# Patient Record
Sex: Male | Born: 1950
Health system: Southern US, Community
[De-identification: ages and names within clinical notes are randomized; demographics above are authoritative.]

## PROBLEM LIST (undated history)

## (undated) DIAGNOSIS — K7460 Unspecified cirrhosis of liver: Secondary | ICD-10-CM

## (undated) DIAGNOSIS — K760 Fatty (change of) liver, not elsewhere classified: Secondary | ICD-10-CM

## (undated) DIAGNOSIS — I2510 Atherosclerotic heart disease of native coronary artery without angina pectoris: Secondary | ICD-10-CM

## (undated) DIAGNOSIS — Z87442 Personal history of urinary calculi: Secondary | ICD-10-CM

## (undated) DIAGNOSIS — E119 Type 2 diabetes mellitus without complications: Secondary | ICD-10-CM

## (undated) DIAGNOSIS — R011 Cardiac murmur, unspecified: Secondary | ICD-10-CM

## (undated) DIAGNOSIS — D61818 Other pancytopenia: Secondary | ICD-10-CM

## (undated) DIAGNOSIS — N189 Chronic kidney disease, unspecified: Secondary | ICD-10-CM

## (undated) DIAGNOSIS — M1990 Unspecified osteoarthritis, unspecified site: Secondary | ICD-10-CM

## (undated) DIAGNOSIS — C220 Liver cell carcinoma: Secondary | ICD-10-CM

## (undated) DIAGNOSIS — G4730 Sleep apnea, unspecified: Secondary | ICD-10-CM

## (undated) DIAGNOSIS — I214 Non-ST elevation (NSTEMI) myocardial infarction: Secondary | ICD-10-CM

## (undated) HISTORY — DX: Other pancytopenia: D61.818

## (undated) HISTORY — PX: HERNIA REPAIR: SHX51

## (undated) HISTORY — PX: TONSILLECTOMY: SUR1361

---

## 2001-07-12 ENCOUNTER — Ambulatory Visit: Admission: RE | Admit: 2001-07-12 | Discharge: 2001-07-12 | Payer: Self-pay | Admitting: Oral Surgery

## 2001-11-08 ENCOUNTER — Ambulatory Visit: Admission: RE | Admit: 2001-11-08 | Discharge: 2001-11-08 | Payer: Self-pay | Admitting: Pulmonary Disease

## 2002-12-31 ENCOUNTER — Ambulatory Visit (HOSPITAL_COMMUNITY): Admission: RE | Admit: 2002-12-31 | Discharge: 2002-12-31 | Payer: Self-pay | Admitting: Family Medicine

## 2002-12-31 ENCOUNTER — Encounter: Payer: Self-pay | Admitting: Family Medicine

## 2003-09-08 ENCOUNTER — Emergency Department (HOSPITAL_COMMUNITY): Admission: EM | Admit: 2003-09-08 | Discharge: 2003-09-08 | Payer: Self-pay | Admitting: Emergency Medicine

## 2005-11-29 ENCOUNTER — Ambulatory Visit: Payer: Self-pay | Admitting: Internal Medicine

## 2005-11-29 ENCOUNTER — Encounter (INDEPENDENT_AMBULATORY_CARE_PROVIDER_SITE_OTHER): Payer: Self-pay | Admitting: *Deleted

## 2005-11-29 ENCOUNTER — Ambulatory Visit (HOSPITAL_COMMUNITY): Admission: RE | Admit: 2005-11-29 | Discharge: 2005-11-29 | Payer: Self-pay | Admitting: Internal Medicine

## 2007-04-07 ENCOUNTER — Ambulatory Visit (HOSPITAL_COMMUNITY): Admission: RE | Admit: 2007-04-07 | Discharge: 2007-04-07 | Payer: Self-pay | Admitting: Family Medicine

## 2007-08-25 ENCOUNTER — Ambulatory Visit (HOSPITAL_COMMUNITY): Admission: RE | Admit: 2007-08-25 | Discharge: 2007-08-25 | Payer: Self-pay | Admitting: Family Medicine

## 2010-09-04 NOTE — Op Note (Signed)
NAME:  Cody Austin, Cody Austin                ACCOUNT NO.:  [id number]   MEDICAL RECORD NO.:  [id number]          PATIENT TYPE:  AMB   LOCATION:  DAY                           FACILITY:  [facility]   PHYSICIAN:  R. Garfield Cornea, M.D. DATE OF BIRTH:  [birth year]   DATE OF PROCEDURE:  11/29/2005  DATE OF DISCHARGE:                                 OPERATIVE REPORT   INDICATIONS FOR PROCEDURE:  The patient is a 60 year old Caucasian male  presented through courtesy of Halford Chessman, M.D. for colorectal cancer  screening.  He is devoid of any lower GI tract symptoms.  He has never had  his colon imaged previously.  There is no family history of colorectal  neoplasia.  Colonoscopy is now being done with standard screening maneuvers.  This approach has been discussed with the patient at length.  Potential  risks, benefits, and alternatives have been reviewed and questions answered.  He is agreeable.  Please documentation in medical record.   DESCRIPTION OF PROCEDURE:  O2 saturation, blood pressure, pulse oximetry on  this patient were monitored throughout this entire procedure.  Conscious  sedation was Versed 3 mg IV and Demerol 50 mg IV in incremental doses.   INSTRUMENT:  Olympus videoscope.   FINDINGS:  Digital rectal examination revealed no abnormalities.  On  endoscopic findings, prep was adequate.   Rectum:  Examination of rectal mucosa including retroflexed view of the anal  verge revealed no abnormalities.   Colon:  Colonic mucosa was surveyed.  Rectosigmoid junction to the left  transverse and right colon to the area of the appendiceal orifice,  ileocecum, and cecum were viewed.  These structures were well seen and  photographed for the record.  From this point the scope was slowly  withdrawn.  All previously viewed mucosal surfaces were again seen.  The  patient was noted to have an 8 mm pedunculated polyp in the very distal  sigmoid rectosigmoid junction and a 4 mm pedunculated polyp in the  mid  descending colon.  The remaining colonic mucosa appeared normal.  The  smaller pedunculated polyp in the mid descending colon was removed with cold  snare technique.  The polyp at the rectosigmoid junction was removed with  hot snare.  Both polyps were recovered through the scope.  The patient  tolerated the procedure well and was reactivated in endoscopy.   IMPRESSION:  Polyps at the rectosigmoid and mid descending colon.  They were  resected as described above.  Otherwise remainder of the colon and rectum  appeared normal.   RECOMMENDATIONS:  No aspirin or arthritis medications for 10 days,  specifically no Naprosyn for 10 days.   Follow up on pathology.   Further recommendations to follow.      Bridgette Habermann, M.D.  Electronically Signed     RMR/MEDQ  D:  11/29/2005  T:  11/29/2005  Job:  [id number]   cc:   Halford Chessman, M.D.  Fax: [phone]

## 2010-11-23 ENCOUNTER — Encounter: Payer: Self-pay | Admitting: Internal Medicine

## 2014-08-06 NOTE — Patient Instructions (Signed)
Cody Austin  08/06/2014   Your procedure is scheduled on:  08/12/2014  Report to [facility] at 10:30 AM.  Call this number if you have problems the morning of surgery: [phone]   Remember:   Do not eat food or drink liquids after midnight.   Take these medicines the morning of surgery with A SIP OF WATER: None   Do not wear jewelry, make-up or nail polish.  Do not wear lotions, powders, or perfumes. You may wear deodorant.  Do not shave 48 hours prior to surgery. Men may shave face and neck.  Do not bring valuables to the hospital.  [facility] is not responsible for any belongings or valuables.               Contacts, dentures or bridgework may not be worn into surgery.  Leave suitcase in the car. After surgery it may be brought to your room.  For patients admitted to the hospital, discharge time is determined by your                treatment team.               Patients discharged the day of surgery will not be allowed to drive  home.  Name and phone number of your driver:   Special Instructions: N/A   Please read over the following fact sheets that you were given: Anesthesia Post-op Instructions   PATIENT INSTRUCTIONS POST-ANESTHESIA  IMMEDIATELY FOLLOWING SURGERY:  Do not drive or operate machinery for the first twenty four hours after surgery.  Do not make any important decisions for twenty four hours after surgery or while taking narcotic pain medications or sedatives.  If you develop intractable nausea and vomiting or a severe headache please notify your doctor immediately.  FOLLOW-UP:  Please make an appointment with your surgeon as instructed. You do not need to follow up with anesthesia unless specifically instructed to do so.  WOUND CARE INSTRUCTIONS (if applicable):  Keep a dry clean dressing on the anesthesia/puncture wound site if there is drainage.  Once the wound has quit draining you may leave it open to air.  Generally you should leave the bandage intact for  twenty four hours unless there is drainage.  If the epidural site drains for more than 36-48 hours please call the anesthesia department.  QUESTIONS?:  Please feel free to call your physician or the hospital operator if you have any questions, and they will be happy to assist you.      Cataract Surgery  A cataract is a clouding of the lens of the eye. When a lens becomes cloudy, vision is reduced based on the degree and nature of the clouding. Surgery may be needed to improve vision. Surgery removes the cloudy lens and usually replaces it with a substitute lens (intraocular lens, IOL). LET YOUR EYE DOCTOR KNOW ABOUT:  Allergies to food or medicine.  Medicines taken including herbs, eye drops, over-the-counter medicines, and creams.  Use of steroids (by mouth or creams).  Previous problems with anesthetics or numbing medicine.  History of bleeding problems or blood clots.  Previous surgery.  Other health problems, including diabetes and kidney problems.  Possibility of pregnancy, if this applies. RISKS AND COMPLICATIONS  Infection.  Inflammation of the eyeball (endophthalmitis) that can spread to both eyes (sympathetic ophthalmia).  Poor wound healing.  If an IOL is inserted, it can later fall out of proper position. This is very uncommon.  Clouding of the  part of your eye that holds an IOL in place. This is called an "after-cataract." These are uncommon but easily treated. BEFORE THE PROCEDURE  Do not eat or drink anything except small amounts of water for 8 to 12 before your surgery, or as directed by your caregiver.  Unless you are told otherwise, continue any eye drops you have been prescribed.  Talk to your primary caregiver about all other medicines that you take (both prescription and nonprescription). In some cases, you may need to stop or change medicines near the time of your surgery. This is most important if you are taking blood-thinning medicine.Do not stop  medicines unless you are told to do so.  Arrange for someone to drive you to and from the procedure.  Do not put contact lenses in either eye on the day of your surgery. PROCEDURE There is more than one method for safely removing a cataract. Your doctor can explain the differences and help determine which is best for you. Phacoemulsification surgery is the most common form of cataract surgery.  An injection is given behind the eye or eye drops are given to make this a painless procedure.  A small cut (incision) is made on the edge of the clear, dome-shaped surface that covers the front of the eye (cornea).  A tiny probe is painlessly inserted into the eye. This device gives off ultrasound waves that soften and break up the cloudy center of the lens. This makes it easier for the cloudy lens to be removed by suction.  An IOL may be implanted.  The normal lens of the eye is covered by a clear capsule. Part of that capsule is intentionally left in the eye to support the IOL.  Your surgeon may or may not use stitches to close the incision. There are other forms of cataract surgery that require a larger incision and stitches to close the eye. This approach is taken in cases where the doctor feels that the cataract cannot be easily removed using phacoemulsification. AFTER THE PROCEDURE  When an IOL is implanted, it does not need care. It becomes a permanent part of your eye and cannot be seen or felt.  Your doctor will schedule follow-up exams to check on your progress.  Review your other medicines with your doctor to see which can be resumed after surgery.  Use eye drops or take medicine as prescribed by your doctor. Document Released: 03/25/2011 Document Revised: 08/20/2013 Document Reviewed: 03/25/2011 [facility] Patient Information 2015 [place], [place]. This information is not intended to replace advice given to you by your health care provider. Make sure you discuss any questions you have  with your health care provider.

## 2014-08-07 ENCOUNTER — Encounter (HOSPITAL_COMMUNITY)
Admission: RE | Admit: 2014-08-07 | Discharge: 2014-08-07 | Disposition: A | Discharge status: Home / Self Care | Payer: BLUE CROSS/BLUE SHIELD | Source: Ambulatory Visit | Attending: Ophthalmology | Admitting: Ophthalmology

## 2014-08-07 ENCOUNTER — Other Ambulatory Visit: Payer: Self-pay

## 2014-08-07 ENCOUNTER — Encounter (HOSPITAL_COMMUNITY): Payer: Self-pay

## 2014-08-07 DIAGNOSIS — Z01812 Encounter for preprocedural laboratory examination: Secondary | ICD-10-CM | POA: Insufficient documentation

## 2014-08-07 DIAGNOSIS — Z01810 Encounter for preprocedural cardiovascular examination: Secondary | ICD-10-CM | POA: Insufficient documentation

## 2014-08-07 HISTORY — DX: Unspecified osteoarthritis, unspecified site: M19.90

## 2014-08-07 HISTORY — DX: Sleep apnea, unspecified: G47.30

## 2014-08-07 HISTORY — DX: Cardiac murmur, unspecified: R01.1

## 2014-08-07 LAB — BASIC METABOLIC PANEL
Anion gap: 7 (ref 5–15)
BUN: 11 mg/dL (ref 6–23)
CHLORIDE: 108 mmol/L (ref 96–112)
CO2: 25 mmol/L (ref 19–32)
CREATININE: 0.85 mg/dL (ref 0.50–1.35)
Calcium: 9 mg/dL (ref 8.4–10.5)
GFR calc non Af Amer: >90 mL/min (ref >90)
Glucose, Bld: 118 mg/dL — ABNORMAL HIGH (ref 70–99)
Potassium: 4.1 mmol/L (ref 3.5–5.1)
Sodium: 140 mmol/L (ref 135–145)

## 2014-08-07 LAB — CBC
HCT: 41.3 % (ref 39.0–52.0)
HEMOGLOBIN: 13.8 g/dL (ref 13.0–17.0)
MCH: 32.3 pg (ref 26.0–34.0)
MCHC: 33.4 g/dL (ref 30.0–36.0)
MCV: 96.7 fL (ref 78.0–100.0)
Platelets: 109 10*3/uL — ABNORMAL LOW (ref 150–400)
RBC: 4.27 MIL/uL (ref 4.22–5.81)
RDW: 14.1 % (ref 11.5–15.5)
WBC: 2.8 10*3/uL — ABNORMAL LOW (ref 4.0–10.5)

## 2014-08-09 MED ORDER — NEOMYCIN-POLYMYXIN-DEXAMETH 3.5-10000-0.1 OP SUSP
OPHTHALMIC | Status: AC
  Filled 2014-08-09: qty 5

## 2014-08-09 MED ORDER — PHENYLEPHRINE HCL 2.5 % OP SOLN
OPHTHALMIC | Status: AC
  Filled 2014-08-09: qty 15

## 2014-08-09 MED ORDER — LIDOCAINE HCL (PF) 1 % IJ SOLN
INTRAMUSCULAR | Status: AC
  Filled 2014-08-09: qty 2

## 2014-08-09 MED ORDER — CYCLOPENTOLATE-PHENYLEPHRINE OP SOLN OPTIME - NO CHARGE
OPHTHALMIC | Status: AC
  Filled 2014-08-09: qty 2

## 2014-08-09 MED ORDER — LIDOCAINE HCL 3.5 % OP GEL
OPHTHALMIC | Status: AC
  Filled 2014-08-09: qty 1

## 2014-08-09 MED ORDER — TETRACAINE HCL 0.5 % OP SOLN
OPHTHALMIC | Status: AC
  Filled 2014-08-09: qty 2

## 2014-08-12 ENCOUNTER — Encounter (HOSPITAL_COMMUNITY)
Admission: RE | Disposition: A | Discharge status: Home / Self Care | Payer: Self-pay | Source: Ambulatory Visit | Attending: Ophthalmology

## 2014-08-12 ENCOUNTER — Encounter (HOSPITAL_COMMUNITY): Payer: Self-pay | Admitting: *Deleted

## 2014-08-12 ENCOUNTER — Ambulatory Visit (HOSPITAL_COMMUNITY)
Admission: RE | Admit: 2014-08-12 | Discharge: 2014-08-12 | Disposition: A | Discharge status: Home / Self Care | Payer: BLUE CROSS/BLUE SHIELD | Source: Ambulatory Visit | Attending: Ophthalmology | Admitting: Ophthalmology

## 2014-08-12 ENCOUNTER — Ambulatory Visit (HOSPITAL_COMMUNITY): Payer: BLUE CROSS/BLUE SHIELD | Admitting: Anesthesiology

## 2014-08-12 DIAGNOSIS — G4730 Sleep apnea, unspecified: Secondary | ICD-10-CM | POA: Insufficient documentation

## 2014-08-12 DIAGNOSIS — H2511 Age-related nuclear cataract, right eye: Secondary | ICD-10-CM | POA: Insufficient documentation

## 2014-08-12 DIAGNOSIS — E119 Type 2 diabetes mellitus without complications: Secondary | ICD-10-CM | POA: Diagnosis not present

## 2014-08-12 DIAGNOSIS — H269 Unspecified cataract: Secondary | ICD-10-CM | POA: Diagnosis present

## 2014-08-12 DIAGNOSIS — M1990 Unspecified osteoarthritis, unspecified site: Secondary | ICD-10-CM | POA: Diagnosis not present

## 2014-08-12 HISTORY — DX: Type 2 diabetes mellitus without complications: E11.9

## 2014-08-12 HISTORY — PX: CATARACT EXTRACTION W/PHACO: SHX586

## 2014-08-12 LAB — GLUCOSE, CAPILLARY: GLUCOSE-CAPILLARY: 110 mg/dL — AB (ref 70–99)

## 2014-08-12 SURGERY — PHACOEMULSIFICATION, CATARACT, WITH IOL INSERTION
Anesthesia: Monitor Anesthesia Care | Site: Eye | Laterality: Right

## 2014-08-12 MED ORDER — CYCLOPENTOLATE-PHENYLEPHRINE 0.2-1 % OP SOLN
1.0000 [drp] | OPHTHALMIC | Status: AC
  Administered 2014-08-12 (×3): 1 [drp] via OPHTHALMIC

## 2014-08-12 MED ORDER — TETRACAINE HCL 0.5 % OP SOLN
1.0000 [drp] | OPHTHALMIC | Status: AC
  Administered 2014-08-12 (×3): 1 [drp] via OPHTHALMIC

## 2014-08-12 MED ORDER — MEPERIDINE HCL 50 MG/ML IJ SOLN: 6.2500 mg | Freq: Once | INTRAMUSCULAR | Status: DC

## 2014-08-12 MED ORDER — LACTATED RINGERS IV SOLN
INTRAVENOUS | Status: DC
  Administered 2014-08-12: 1000 mL via INTRAVENOUS

## 2014-08-12 MED ORDER — BSS IO SOLN
INTRAOCULAR | Status: DC | PRN
  Administered 2014-08-12: 15 mL

## 2014-08-12 MED ORDER — FENTANYL CITRATE (PF) 100 MCG/2ML IJ SOLN
25.0000 ug | INTRAMUSCULAR | Status: AC
  Administered 2014-08-12 (×2): 25 ug via INTRAVENOUS

## 2014-08-12 MED ORDER — EPINEPHRINE HCL 1 MG/ML IJ SOLN
INTRAOCULAR | Status: DC | PRN
  Administered 2014-08-12: 500 mL

## 2014-08-12 MED ORDER — POVIDONE-IODINE 5 % OP SOLN
OPHTHALMIC | Status: DC | PRN
  Administered 2014-08-12: 1 via OPHTHALMIC

## 2014-08-12 MED ORDER — FENTANYL CITRATE (PF) 100 MCG/2ML IJ SOLN
INTRAMUSCULAR | Status: AC
  Filled 2014-08-12: qty 2

## 2014-08-12 MED ORDER — NEOMYCIN-POLYMYXIN-DEXAMETH 3.5-10000-0.1 OP SUSP
OPHTHALMIC | Status: DC | PRN
  Administered 2014-08-12: 2 [drp] via OPHTHALMIC

## 2014-08-12 MED ORDER — PHENYLEPHRINE HCL 2.5 % OP SOLN
1.0000 [drp] | OPHTHALMIC | Status: AC
  Administered 2014-08-12 (×3): 1 [drp] via OPHTHALMIC

## 2014-08-12 MED ORDER — EPINEPHRINE HCL 1 MG/ML IJ SOLN
INTRAMUSCULAR | Status: AC
  Filled 2014-08-12: qty 1

## 2014-08-12 MED ORDER — ONDANSETRON HCL 4 MG/2ML IJ SOLN: 4.0000 mg | Freq: Once | INTRAMUSCULAR | Status: DC | PRN

## 2014-08-12 MED ORDER — MIDAZOLAM HCL 2 MG/2ML IJ SOLN
1.0000 mg | INTRAMUSCULAR | Status: DC | PRN
Start: 2014-08-12 — End: 2014-08-12
  Administered 2014-08-12: 2 mg via INTRAVENOUS

## 2014-08-12 MED ORDER — FENTANYL CITRATE (PF) 100 MCG/2ML IJ SOLN: 25.0000 ug | INTRAMUSCULAR | Status: DC | PRN

## 2014-08-12 MED ORDER — LIDOCAINE HCL (PF) 1 % IJ SOLN
INTRAMUSCULAR | Status: DC | PRN
  Administered 2014-08-12: .5 mL

## 2014-08-12 MED ORDER — PROVISC 10 MG/ML IO SOLN
INTRAOCULAR | Status: DC | PRN
  Administered 2014-08-12: 0.85 mL via INTRAOCULAR

## 2014-08-12 MED ORDER — LIDOCAINE HCL 3.5 % OP GEL
1.0000 "application " | Freq: Once | OPHTHALMIC | Status: AC
  Administered 2014-08-12: 1 via OPHTHALMIC

## 2014-08-12 MED ORDER — MIDAZOLAM HCL 2 MG/2ML IJ SOLN
INTRAMUSCULAR | Status: AC
  Filled 2014-08-12: qty 2

## 2014-08-12 SURGICAL SUPPLY — 33 items
CAPSULAR TENSION RING-AMO (OPHTHALMIC RELATED) IMPLANT
CLOTH BEACON ORANGE TIMEOUT ST (SAFETY) ×2 IMPLANT
EYE SHIELD UNIVERSAL CLEAR (GAUZE/BANDAGES/DRESSINGS) ×2 IMPLANT
GLOVE BIO SURGEON STRL SZ 6.5 (GLOVE) IMPLANT
GLOVE BIOGEL PI IND STRL 6.5 (GLOVE) ×1 IMPLANT
GLOVE BIOGEL PI IND STRL 7.0 (GLOVE) IMPLANT
GLOVE BIOGEL PI IND STRL 7.5 (GLOVE) IMPLANT
GLOVE BIOGEL PI INDICATOR 6.5 (GLOVE) ×1
GLOVE BIOGEL PI INDICATOR 7.0 (GLOVE)
GLOVE BIOGEL PI INDICATOR 7.5 (GLOVE)
GLOVE ECLIPSE 6.5 STRL STRAW (GLOVE) IMPLANT
GLOVE ECLIPSE 7.0 STRL STRAW (GLOVE) IMPLANT
GLOVE ECLIPSE 7.5 STRL STRAW (GLOVE) IMPLANT
GLOVE EXAM NITRILE LRG STRL (GLOVE) IMPLANT
GLOVE EXAM NITRILE MD LF STRL (GLOVE) ×2 IMPLANT
GLOVE SKINSENSE NS SZ6.5 (GLOVE)
GLOVE SKINSENSE NS SZ7.0 (GLOVE)
GLOVE SKINSENSE STRL SZ6.5 (GLOVE) IMPLANT
GLOVE SKINSENSE STRL SZ7.0 (GLOVE) IMPLANT
KIT VITRECTOMY (OPHTHALMIC RELATED) IMPLANT
PAD ARMBOARD 7.5X6 YLW CONV (MISCELLANEOUS) ×2 IMPLANT
PROC W NO LENS (INTRAOCULAR LENS)
PROC W SPEC LENS (INTRAOCULAR LENS)
PROCESS W NO LENS (INTRAOCULAR LENS) IMPLANT
PROCESS W SPEC LENS (INTRAOCULAR LENS) IMPLANT
RETRACTOR IRIS SIGHTPATH (OPHTHALMIC RELATED) IMPLANT
RING MALYGIN (MISCELLANEOUS) IMPLANT
SIGHTPATH CAT PROC W REG LENS (Ophthalmic Related) ×2 IMPLANT
SYRINGE LUER LOK 1CC (MISCELLANEOUS) ×2 IMPLANT
TAPE SURG TRANSPORE 1 IN (GAUZE/BANDAGES/DRESSINGS) ×1 IMPLANT
TAPE SURGICAL TRANSPORE 1 IN (GAUZE/BANDAGES/DRESSINGS) ×1
VISCOELASTIC ADDITIONAL (OPHTHALMIC RELATED) IMPLANT
WATER STERILE IRR 250ML POUR (IV SOLUTION) ×2 IMPLANT

## 2014-08-12 NOTE — H&P (Signed)
I have reviewed the H&P, the patient was re-examined, and I have identified no interval changes in medical condition and plan of care since the history and physical of record  

## 2014-08-12 NOTE — Transfer of Care (Signed)
Immediate Anesthesia Transfer of Care Note  Patient: Cody Austin  Procedure(s) Performed: Procedure(s) with comments: CATARACT EXTRACTION PHACO AND INTRAOCULAR LENS PLACEMENT (IOC) (Right) - CDE 9.32  Patient Location: PACU  Anesthesia Type:MAC  Level of Consciousness: awake  Airway & Oxygen Therapy: Patient Spontanous Breathing  Post-op Assessment: Report given to RN  Post vital signs: Reviewed  Last Vitals:  Filed Vitals:   08/12/14 1115  BP: 129/75  Resp: 13    Complications: No apparent anesthesia complications

## 2014-08-12 NOTE — Anesthesia Postprocedure Evaluation (Signed)
  Anesthesia Post-op Note  Patient: Cody Austin  Procedure(s) Performed: Procedure(s) with comments: CATARACT EXTRACTION PHACO AND INTRAOCULAR LENS PLACEMENT (IOC) (Right) - CDE 9.32  Patient Location: Short Stay  Anesthesia Type:MAC  Level of Consciousness: awake, alert  and oriented  Airway and Oxygen Therapy: Patient Spontanous Breathing  Post-op Pain: none  Post-op Assessment: Post-op Vital signs reviewed, Patient's Cardiovascular Status Stable, Respiratory Function Stable, Patent Airway and No signs of Nausea or vomiting  Post-op Vital Signs: Reviewed and stable  Last Vitals:  Filed Vitals:   08/12/14 1115  BP: 129/75  Resp: 13    Complications: No apparent anesthesia complications

## 2014-08-12 NOTE — Anesthesia Preprocedure Evaluation (Signed)
Anesthesia Evaluation  Patient identified by MRN, date of birth, ID band Patient awake    Reviewed: Allergy & Precautions, NPO status , Patient's Chart, lab work & pertinent test results  Airway Mallampati: II  TM Distance: >3 FB     Dental  (+) Teeth Intact   Pulmonary sleep apnea ,  breath sounds clear to auscultation        Cardiovascular negative cardio ROS  Rhythm:Regular Rate:Normal     Neuro/Psych    GI/Hepatic negative GI ROS,   Endo/Other  diabetes, Type 2, Oral Hypoglycemic Agents  Renal/GU      Musculoskeletal  (+) Arthritis -,   Abdominal   Peds  Hematology   Anesthesia Other Findings   Reproductive/Obstetrics                             Anesthesia Physical Anesthesia Plan  ASA: III  Anesthesia Plan: MAC   Post-op Pain Management:    Induction: Intravenous  Airway Management Planned: Nasal Cannula  Additional Equipment:   Intra-op Plan:   Post-operative Plan:   Informed Consent: I have reviewed the patients History and Physical, chart, labs and discussed the procedure including the risks, benefits and alternatives for the proposed anesthesia with the patient or authorized representative who has indicated his/her understanding and acceptance.     Plan Discussed with:   Anesthesia Plan Comments:         Anesthesia Quick Evaluation

## 2014-08-12 NOTE — Discharge Instructions (Signed)

## 2014-08-12 NOTE — Op Note (Signed)
Date of Admission: 08/12/2014  Date of Surgery: 08/12/2014   Pre-Op Dx: Cataract Right Eye  Post-Op Dx: Senile Nuclear Cataract Right  Eye,  Dx Code H25.11  Surgeon: [name], M.D.  Assistants: None  Anesthesia: Topical with MAC  Indications: Painless, progressive loss of vision with compromise of daily activities.  Surgery: Cataract Extraction with Intraocular lens Implant Right Eye  Discription: The patient had dilating drops and viscous lidocaine placed into the Right eye in the pre-op holding area. After transfer to the operating room, a time out was performed. The patient was then prepped and draped. Beginning with a 10 degree blade a paracentesis port was made at the surgeon's 2 o'clock position. The anterior chamber was then filled with 1% non-preserved lidocaine. This was followed by filling the anterior chamber with Provisc.  A 2.40m keratome blade was used to make a clear corneal incision at the temporal limbus.  A bent cystatome needle was used to create a continuous tear capsulotomy. Hydrodissection was performed with balanced salt solution on a Fine canula. The lens nucleus was then removed using the phacoemulsification handpiece. Residual cortex was removed with the I&A handpiece. The anterior chamber and capsular bag were refilled with Provisc. A posterior chamber intraocular lens was placed into the capsular bag with it's injector. The implant was positioned with the Kuglan hook. The Provisc was then removed from the anterior chamber and capsular bag with the I&A handpiece. Stromal hydration of the main incision and paracentesis port was performed with BSS on a Fine canula. The wounds were tested for leak which was negative. The patient tolerated the procedure well. There were no operative complications. The patient was then transferred to the recovery room in stable condition.  Complications: None  Specimen: None  EBL: None  Prosthetic device: Hoya iSert 250, power 15.5 D,  SN [id number]

## 2014-08-13 ENCOUNTER — Encounter (HOSPITAL_COMMUNITY): Payer: Self-pay | Admitting: Ophthalmology

## 2014-08-20 ENCOUNTER — Encounter (HOSPITAL_COMMUNITY)
Admission: RE | Admit: 2014-08-20 | Discharge: 2014-08-20 | Disposition: A | Discharge status: Home / Self Care | Payer: BLUE CROSS/BLUE SHIELD | Source: Ambulatory Visit | Attending: Ophthalmology | Admitting: Ophthalmology

## 2014-08-20 ENCOUNTER — Encounter (HOSPITAL_COMMUNITY): Payer: Self-pay

## 2014-08-22 ENCOUNTER — Inpatient Hospital Stay (HOSPITAL_COMMUNITY): Admission: RE | Admit: 2014-08-22 | Payer: BLUE CROSS/BLUE SHIELD | Source: Ambulatory Visit

## 2014-08-23 MED ORDER — LIDOCAINE HCL 3.5 % OP GEL
OPHTHALMIC | Status: AC
  Filled 2014-08-23: qty 1

## 2014-08-23 MED ORDER — LIDOCAINE HCL (PF) 1 % IJ SOLN
INTRAMUSCULAR | Status: AC
  Filled 2014-08-23: qty 2

## 2014-08-23 MED ORDER — PHENYLEPHRINE HCL 2.5 % OP SOLN
OPHTHALMIC | Status: AC
  Filled 2014-08-23: qty 15

## 2014-08-23 MED ORDER — NEOMYCIN-POLYMYXIN-DEXAMETH 3.5-10000-0.1 OP SUSP
OPHTHALMIC | Status: AC
  Filled 2014-08-23: qty 5

## 2014-08-23 MED ORDER — CYCLOPENTOLATE-PHENYLEPHRINE OP SOLN OPTIME - NO CHARGE
OPHTHALMIC | Status: AC
  Filled 2014-08-23: qty 2

## 2014-08-23 MED ORDER — TETRACAINE HCL 0.5 % OP SOLN
OPHTHALMIC | Status: AC
  Filled 2014-08-23: qty 2

## 2014-08-26 ENCOUNTER — Ambulatory Visit (HOSPITAL_COMMUNITY)
Admission: RE | Admit: 2014-08-26 | Discharge: 2014-08-26 | Disposition: A | Discharge status: Home / Self Care | Payer: BLUE CROSS/BLUE SHIELD | Source: Ambulatory Visit | Attending: Ophthalmology | Admitting: Ophthalmology

## 2014-08-26 ENCOUNTER — Ambulatory Visit (HOSPITAL_COMMUNITY): Payer: BLUE CROSS/BLUE SHIELD | Admitting: Anesthesiology

## 2014-08-26 ENCOUNTER — Encounter (HOSPITAL_COMMUNITY)
Admission: RE | Disposition: A | Discharge status: Home / Self Care | Payer: Self-pay | Source: Ambulatory Visit | Attending: Ophthalmology

## 2014-08-26 ENCOUNTER — Encounter (HOSPITAL_COMMUNITY): Payer: Self-pay

## 2014-08-26 DIAGNOSIS — H2512 Age-related nuclear cataract, left eye: Secondary | ICD-10-CM | POA: Diagnosis not present

## 2014-08-26 HISTORY — PX: CATARACT EXTRACTION W/PHACO: SHX586

## 2014-08-26 LAB — GLUCOSE, CAPILLARY: Glucose-Capillary: 114 mg/dL — ABNORMAL HIGH (ref 70–99)

## 2014-08-26 SURGERY — PHACOEMULSIFICATION, CATARACT, WITH IOL INSERTION
Anesthesia: Monitor Anesthesia Care | Site: Eye | Laterality: Left

## 2014-08-26 MED ORDER — PHENYLEPHRINE HCL 2.5 % OP SOLN
1.0000 [drp] | OPHTHALMIC | Status: AC
  Administered 2014-08-26 (×3): 1 [drp] via OPHTHALMIC

## 2014-08-26 MED ORDER — FENTANYL CITRATE (PF) 100 MCG/2ML IJ SOLN
25.0000 ug | INTRAMUSCULAR | Status: AC
  Administered 2014-08-26 (×2): 25 ug via INTRAVENOUS

## 2014-08-26 MED ORDER — LIDOCAINE HCL 3.5 % OP GEL
1.0000 "application " | Freq: Once | OPHTHALMIC | Status: AC
  Administered 2014-08-26: 1 via OPHTHALMIC

## 2014-08-26 MED ORDER — LIDOCAINE HCL (PF) 1 % IJ SOLN
INTRAMUSCULAR | Status: DC | PRN
  Administered 2014-08-26: .5 mL

## 2014-08-26 MED ORDER — EPINEPHRINE HCL 1 MG/ML IJ SOLN
INTRAOCULAR | Status: DC | PRN
  Administered 2014-08-26: 13:00:00

## 2014-08-26 MED ORDER — MIDAZOLAM HCL 2 MG/2ML IJ SOLN
INTRAMUSCULAR | Status: AC
  Filled 2014-08-26: qty 2

## 2014-08-26 MED ORDER — EPINEPHRINE HCL 1 MG/ML IJ SOLN
INTRAMUSCULAR | Status: AC
  Filled 2014-08-26: qty 1

## 2014-08-26 MED ORDER — LACTATED RINGERS IV SOLN
INTRAVENOUS | Status: DC
  Administered 2014-08-26: 12:00:00 via INTRAVENOUS

## 2014-08-26 MED ORDER — CYCLOPENTOLATE-PHENYLEPHRINE 0.2-1 % OP SOLN
1.0000 [drp] | OPHTHALMIC | Status: AC
  Administered 2014-08-26 (×3): 1 [drp] via OPHTHALMIC

## 2014-08-26 MED ORDER — FENTANYL CITRATE (PF) 100 MCG/2ML IJ SOLN
INTRAMUSCULAR | Status: AC
  Filled 2014-08-26: qty 2

## 2014-08-26 MED ORDER — MIDAZOLAM HCL 2 MG/2ML IJ SOLN
1.0000 mg | INTRAMUSCULAR | Status: DC | PRN
  Administered 2014-08-26: 2 mg via INTRAVENOUS

## 2014-08-26 MED ORDER — BSS IO SOLN
INTRAOCULAR | Status: DC | PRN
  Administered 2014-08-26: 15 mL via INTRAOCULAR

## 2014-08-26 MED ORDER — PROVISC 10 MG/ML IO SOLN
INTRAOCULAR | Status: DC | PRN
  Administered 2014-08-26: 0.85 mL via INTRAOCULAR

## 2014-08-26 MED ORDER — NEOMYCIN-POLYMYXIN-DEXAMETH 3.5-10000-0.1 OP SUSP
OPHTHALMIC | Status: DC | PRN
  Administered 2014-08-26: 1 [drp] via OPHTHALMIC

## 2014-08-26 MED ORDER — POVIDONE-IODINE 5 % OP SOLN
OPHTHALMIC | Status: DC | PRN
  Administered 2014-08-26: 1 via OPHTHALMIC

## 2014-08-26 MED ORDER — TETRACAINE HCL 0.5 % OP SOLN
1.0000 [drp] | OPHTHALMIC | Status: AC
  Administered 2014-08-26 (×3): 1 [drp] via OPHTHALMIC

## 2014-08-26 SURGICAL SUPPLY — 11 items
CLOTH BEACON ORANGE TIMEOUT ST (SAFETY) ×3 IMPLANT
EYE SHIELD UNIVERSAL CLEAR (GAUZE/BANDAGES/DRESSINGS) ×3 IMPLANT
GLOVE BIOGEL PI IND STRL 7.0 (GLOVE) ×1 IMPLANT
GLOVE BIOGEL PI INDICATOR 7.0 (GLOVE) ×2
GLOVE EXAM NITRILE LRG STRL (GLOVE) ×3 IMPLANT
PAD ARMBOARD 7.5X6 YLW CONV (MISCELLANEOUS) ×3 IMPLANT
SIGHTPATH CAT PROC W REG LENS (Ophthalmic Related) ×3 IMPLANT
SYRINGE LUER LOK 1CC (MISCELLANEOUS) ×3 IMPLANT
TAPE SURG TRANSPORE 1 IN (GAUZE/BANDAGES/DRESSINGS) ×1 IMPLANT
TAPE SURGICAL TRANSPORE 1 IN (GAUZE/BANDAGES/DRESSINGS) ×2
WATER STERILE IRR 250ML POUR (IV SOLUTION) ×3 IMPLANT

## 2014-08-26 NOTE — Op Note (Signed)
Date of Admission: 08/26/2014  Date of Surgery: 08/26/2014   Pre-Op Dx: Cataract Left Eye  Post-Op Dx: Senile Nuclear Cataract Left  Eye,  Dx Code H25.12  Surgeon: [name], M.D.  Assistants: None  Anesthesia: Topical with MAC  Indications: Painless, progressive loss of vision with compromise of daily activities.  Surgery: Cataract Extraction with Intraocular lens Implant Left Eye  Discription: The patient had dilating drops and viscous lidocaine placed into the Left eye in the pre-op holding area. After transfer to the operating room, a time out was performed. The patient was then prepped and draped. Beginning with a 50 degree blade a paracentesis port was made at the surgeon's 2 o'clock position. The anterior chamber was then filled with 1% non-preserved lidocaine. This was followed by filling the anterior chamber with Provisc.  A 2.43m keratome blade was used to make a clear corneal incision at the temporal limbus.  A bent cystatome needle was used to create a continuous tear capsulotomy. Hydrodissection was performed with balanced salt solution on a Fine canula. The lens nucleus was then removed using the phacoemulsification handpiece. Residual cortex was removed with the I&A handpiece. The anterior chamber and capsular bag were refilled with Provisc. A posterior chamber intraocular lens was placed into the capsular bag with it's injector. The implant was positioned with the Kuglan hook. The Provisc was then removed from the anterior chamber and capsular bag with the I&A handpiece. Stromal hydration of the main incision and paracentesis port was performed with BSS on a Fine canula. The wounds were tested for leak which was negative. The patient tolerated the procedure well. There were no operative complications. The patient was then transferred to the recovery room in stable condition.  Complications: None  Specimen: None  EBL: None  Prosthetic device: Hoya iSert 250, power 15.5 D, SN  [id number]

## 2014-08-26 NOTE — H&P (Signed)
I have reviewed the H&P, the patient was re-examined, and I have identified no interval changes in medical condition and plan of care since the history and physical of record  

## 2014-08-26 NOTE — Anesthesia Postprocedure Evaluation (Signed)
  Anesthesia Post-op Note  Patient: Cody Austin  Procedure(s) Performed: Procedure(s) with comments: CATARACT EXTRACTION PHACO AND INTRAOCULAR LENS PLACEMENT (IOC) (Left) - CDE:  9.49  Patient Location: Short Stay  Anesthesia Type:MAC  Level of Consciousness: awake, alert , oriented and patient cooperative  Airway and Oxygen Therapy: Patient Spontanous Breathing  Post-op Pain: none  Post-op Assessment: Post-op Vital signs reviewed, Patient's Cardiovascular Status Stable, Respiratory Function Stable, Patent Airway, No signs of Nausea or vomiting and Adequate PO intake  Post-op Vital Signs: Reviewed and stable  Last Vitals:  Filed Vitals:   08/26/14 1220  BP: 132/74  Pulse:   Temp:   Resp: 22    Complications: No apparent anesthesia complications

## 2014-08-26 NOTE — Discharge Instructions (Signed)

## 2014-08-26 NOTE — Transfer of Care (Signed)
Immediate Anesthesia Transfer of Care Note  Patient: Cody Austin  Procedure(s) Performed: Procedure(s) with comments: CATARACT EXTRACTION PHACO AND INTRAOCULAR LENS PLACEMENT (IOC) (Left) - CDE:  9.49  Patient Location: Short Stay  Anesthesia Type:MAC  Level of Consciousness: awake, alert , oriented and patient cooperative  Airway & Oxygen Therapy: Patient Spontanous Breathing  Post-op Assessment: Report given to RN, Post -op Vital signs reviewed and stable and Patient moving all extremities  Post vital signs: Reviewed and stable  Last Vitals:  Filed Vitals:   08/26/14 1220  BP: 132/74  Pulse:   Temp:   Resp: 22    Complications: No apparent anesthesia complications

## 2014-08-26 NOTE — Anesthesia Preprocedure Evaluation (Signed)
Anesthesia Evaluation  Patient identified by MRN, date of birth, ID band Patient awake    Reviewed: Allergy & Precautions, NPO status , Patient's Chart, lab work & pertinent test results  Airway Mallampati: II  TM Distance: >3 FB     Dental  (+) Teeth Intact   Pulmonary sleep apnea ,  breath sounds clear to auscultation        Cardiovascular negative cardio ROS  Rhythm:Regular Rate:Normal     Neuro/Psych    GI/Hepatic negative GI ROS,   Endo/Other  diabetes, Type 2, Oral Hypoglycemic Agents  Renal/GU      Musculoskeletal  (+) Arthritis -,   Abdominal   Peds  Hematology   Anesthesia Other Findings   Reproductive/Obstetrics                             Anesthesia Physical Anesthesia Plan  ASA: III  Anesthesia Plan: MAC   Post-op Pain Management:    Induction: Intravenous  Airway Management Planned: Nasal Cannula  Additional Equipment:   Intra-op Plan:   Post-operative Plan:   Informed Consent: I have reviewed the patients History and Physical, chart, labs and discussed the procedure including the risks, benefits and alternatives for the proposed anesthesia with the patient or authorized representative who has indicated his/her understanding and acceptance.     Plan Discussed with:   Anesthesia Plan Comments:         Anesthesia Quick Evaluation

## 2014-08-27 ENCOUNTER — Encounter (HOSPITAL_COMMUNITY): Payer: Self-pay | Admitting: Ophthalmology

## 2014-10-28 ENCOUNTER — Other Ambulatory Visit (HOSPITAL_COMMUNITY): Payer: Self-pay | Admitting: Oncology

## 2014-10-28 DIAGNOSIS — D696 Thrombocytopenia, unspecified: Secondary | ICD-10-CM

## 2014-11-04 ENCOUNTER — Ambulatory Visit (HOSPITAL_COMMUNITY)
Admission: RE | Admit: 2014-11-04 | Discharge: 2014-11-04 | Disposition: A | Discharge status: Home / Self Care | Payer: BLUE CROSS/BLUE SHIELD | Source: Ambulatory Visit | Attending: Oncology | Admitting: Oncology

## 2014-11-04 DIAGNOSIS — D696 Thrombocytopenia, unspecified: Secondary | ICD-10-CM | POA: Insufficient documentation

## 2014-11-04 DIAGNOSIS — N200 Calculus of kidney: Secondary | ICD-10-CM | POA: Diagnosis not present

## 2014-11-04 DIAGNOSIS — K7460 Unspecified cirrhosis of liver: Secondary | ICD-10-CM | POA: Insufficient documentation

## 2014-11-04 DIAGNOSIS — I8510 Secondary esophageal varices without bleeding: Secondary | ICD-10-CM | POA: Diagnosis not present

## 2014-11-04 DIAGNOSIS — R161 Splenomegaly, not elsewhere classified: Secondary | ICD-10-CM | POA: Insufficient documentation

## 2014-11-04 MED ORDER — IOHEXOL 300 MG/ML  SOLN
100.0000 mL | Freq: Once | INTRAMUSCULAR | Status: AC | PRN
  Administered 2014-11-04: 100 mL via INTRAVENOUS

## 2014-11-19 ENCOUNTER — Telehealth (INDEPENDENT_AMBULATORY_CARE_PROVIDER_SITE_OTHER): Payer: Self-pay | Admitting: *Deleted

## 2014-11-19 ENCOUNTER — Encounter (INDEPENDENT_AMBULATORY_CARE_PROVIDER_SITE_OTHER): Payer: Self-pay | Admitting: Internal Medicine

## 2014-11-19 ENCOUNTER — Ambulatory Visit (INDEPENDENT_AMBULATORY_CARE_PROVIDER_SITE_OTHER): Payer: BLUE CROSS/BLUE SHIELD | Admitting: Internal Medicine

## 2014-11-19 ENCOUNTER — Other Ambulatory Visit (INDEPENDENT_AMBULATORY_CARE_PROVIDER_SITE_OTHER): Payer: Self-pay | Admitting: *Deleted

## 2014-11-19 VITALS — BP 124/80 | HR 74 | Temp 98.0°F | Resp 18 | Ht 67.0 in | Wt 225.7 lb

## 2014-11-19 DIAGNOSIS — R7309 Other abnormal glucose: Secondary | ICD-10-CM | POA: Diagnosis not present

## 2014-11-19 DIAGNOSIS — K7460 Unspecified cirrhosis of liver: Secondary | ICD-10-CM | POA: Diagnosis not present

## 2014-11-19 DIAGNOSIS — E669 Obesity, unspecified: Secondary | ICD-10-CM | POA: Diagnosis not present

## 2014-11-19 DIAGNOSIS — Z86010 Personal history of colonic polyps: Secondary | ICD-10-CM

## 2014-11-19 DIAGNOSIS — K7469 Other cirrhosis of liver: Secondary | ICD-10-CM

## 2014-11-19 DIAGNOSIS — D61818 Other pancytopenia: Secondary | ICD-10-CM

## 2014-11-19 DIAGNOSIS — R7303 Prediabetes: Secondary | ICD-10-CM | POA: Insufficient documentation

## 2014-11-19 HISTORY — DX: Other pancytopenia: D61.818

## 2014-11-19 LAB — PROTIME-INR
INR: 1.29 (ref <1.50)
PROTHROMBIN TIME: 16.1 s — AB (ref 11.6–15.2)

## 2014-11-19 NOTE — Telephone Encounter (Signed)
Patient needs trilyte 

## 2014-11-19 NOTE — Progress Notes (Signed)
Reason for consultation:  For evaluation of new diagnosis of cirrhosis.  History of present illness:  Cody Austin is 64 year old Caucasian male who is referred through the courtesy of Dr. Marco Collie for further evaluation of new diagnosis of cirrhosis. Cody Austin was recently noted to have pancytopenia by Dr. Aquilla Solian and referred to Dr. Abran Duke for evaluation. Cody Austin reports pancytopenia did not improve on stopping of OTC medications as well as metformin. He underwent abdominopelvic CT which showed cirrhotic appearing liver and splenomegaly. Cody Austin denies history of jaundice or elevated transaminases. There is no history of blood transfusion or family history of liver disease. He has very good appetite. Since he is prediabetic he tries to limit snacks and desserts. He denies heartburn nausea vomiting dysphagia or abdominal pain. His bowels move daily. He denies melena or rectal bleeding. He denies recent weight gain. He stays busy but does not do regular exercise.   Current Medications: Outpatient Encounter Prescriptions as of 11/19/2014  Medication Sig  . atorvastatin (LIPITOR) 40 MG tablet Take 40 mg by mouth daily.  . fenofibrate (TRICOR) 145 MG tablet Take 145 mg by mouth daily.  . metFORMIN (GLUCOPHAGE) 500 MG tablet Take 500 mg by mouth daily with breakfast. Cody Austin states that he takes at night.  . [DISCONTINUED] aspirin EC 81 MG tablet Take 81 mg by mouth daily.  . [DISCONTINUED] Glucosamine-Chondroit-Vit C-Mn (GLUCOSAMINE CHONDROITIN COMPLX) CAPS Take 1 capsule by mouth 2 (two) times daily.  . [DISCONTINUED] omega-3 fish oil (MAXEPA) 1000 MG CAPS capsule Take 1 capsule by mouth 2 (two) times daily.   No facility-administered encounter medications on file as of 11/19/2014.   Past medical history: Tonsillectomy 15 years ago. ? Right inguinal herniorrhaphy 30 years ago. Right forearm fracture treated with immobilization 20 years ago. He had right cataract extraction in April 2016 and  left cataract extraction in May 2016. Obesity. He had colonoscopy in August 2007 with removal of 2 polyps and one was tubular adenoma. He is been prediabetic for 5 years. History of kidney stones. He passed stone back in October 2015 and recent CT revealed small calcified stone in left kidney.  Allergies: No Known Allergies   Family history: Father died of bone cancer at age 70 and mother died of ruptured intracranial aneurysm in her 61s. He has sister age 55 in good health. He lost one brother at age 73.  Social history: He's married and accompanied by his wife today. They have 3 sons ages 55 and 40(twins). One of his sons is a Industrial/product designer and works in [place]. 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 He retired 4 years ago. He has never smoked cigarettes and drinks beer occasionally.   Physical examination: Blood pressure 124/80, pulse 74, temperature 98 F (36.7 C), temperature source Oral, resp. rate 18, height 5' 7"  (1.702 m), weight 225 lb 11.2 oz (102.377 kg). Cody Austin is alert and in no acute distress. Asterixis absent. Conjunctiva is pink. Sclera is nonicteric Oropharyngeal mucosa is normal. No neck masses or thyromegaly noted. No gynecomastia or spider angioma mid are noted. He has keratosis. Cardiac exam with regular rhythm normal S1 and S2. No murmur or gallop noted. Lungs are clear to auscultation. Abdomen is protuberant is soft and nontender without masses. Spleen tip is not palpable. Liver edge is firm and midepigastric region. This plan is 14-15 cm. Shifting dullness is absent. No LE edema or clubbing noted.  Labs/studies Results: Lab data from 09/25/2014  WBC 3.4, H&H 13.5 and 40.7  and platelet count 110K Serum iron 136, TIBC 377 and saturation 36%  Serum ferritin 438  B-12 610 and folate 10.2.   Lab data from 10/28/2014  WBC 3.0, H&H 14.3 and 41.2 and platelet count 110 K  Glucose 95, BUN 12, creatinine 0.79   Serum sodium 140, potassium 3.9, chloride 103, CO2 26 and serum calcium 9.4.  Hemoglobin A1c 6.3.   Abdominopelvic CT from 11/04/2014 reviewed with Cody Austin and his wife. Cirrhotic appearing liver with splenomegaly and distal esophageal varices.  Calcified gallstones and single nonobstructing left renal stone. Mildly reactive lymph nodes within the porta hepatis and mild atherosclerosis.    Assessment:  #1. Cirrhosis most likely secondary to NAFLD. He does not have risk factors for hepatitis B or C. He appears to have compensated hepatic function. Iron studies by Dr. Tressie Stalker not consistent with hemochromatosis. Serum iron and saturation are normal. Serum ferritin is mildly elevated that would appear to be due to chronic liver disease and not the cause of liver disease. In addition to ruling out chronic viral hepatitis would also screen for autoimmune hepatitis. #2. Obesity. He needs to watch calorie intake and should do regular exercise in order to lose at least 25 pounds. #3. History of colonic adenoma. #4. Pancytopenia secondary to cirrhosis and splenomegaly.   Recommendations:  Cody Austin advised not to eat raw fish. He will go to the lab for hepatitis B surface antigen, hepatitis C virus antibody, sedimentation rate and ANA. Will  Also check LFTs, INR and AFP. He will need to be vaccinated for hepatitis A and B presuming hepatitis B surface antigen is negative. Esophagogastroduodenoscopy looking for varices and if esophageal varices or large these will be banded in which case we'll also start him on beta blocker. Surveillance colonoscopy. Cody Austin will be due for screening ultrasound in January 2017. Cody Austin advised to exercise/walking at least 4 days each week for minimum of 30 minutes. Office visit in 6 months.

## 2014-11-19 NOTE — Patient Instructions (Signed)
Physician will call with results of blood tests when completed. Remember not to eat raw fish. Regular walking or exercise as discussed for a minimum of 30 minutes. He should try to exercise at least 4 days each week. Esophagogastroduodenoscopy and colonoscopy to be scheduled.

## 2014-11-20 LAB — HEPATIC FUNCTION PANEL
ALBUMIN: 3.9 g/dL (ref 3.6–5.1)
ALT: 38 U/L (ref 9–46)
AST: 40 U/L — ABNORMAL HIGH (ref 10–35)
Alkaline Phosphatase: 80 U/L (ref 40–115)
BILIRUBIN DIRECT: 0.3 mg/dL — AB (ref <=0.2)
Indirect Bilirubin: 0.7 mg/dL (ref 0.2–1.2)
Total Bilirubin: 1 mg/dL (ref 0.2–1.2)
Total Protein: 6.7 g/dL (ref 6.1–8.1)

## 2014-11-20 LAB — SEDIMENTATION RATE: Sed Rate: 1 mm/hr (ref 0–20)

## 2014-11-20 LAB — HEPATITIS C ANTIBODY: HCV AB: NEGATIVE

## 2014-11-20 LAB — AFP TUMOR MARKER: AFP-Tumor Marker: 7.2 ng/mL — ABNORMAL HIGH (ref <6.1)

## 2014-11-20 LAB — ANA: Anti Nuclear Antibody(ANA): NEGATIVE

## 2014-11-20 LAB — HEPATITIS B SURFACE ANTIGEN: Hepatitis B Surface Ag: NEGATIVE

## 2014-11-20 MED ORDER — PEG 3350-KCL-NA BICARB-NACL 420 G PO SOLR: 4000.0000 mL | Freq: Once | ORAL | Status: DC

## 2014-11-22 ENCOUNTER — Encounter (INDEPENDENT_AMBULATORY_CARE_PROVIDER_SITE_OTHER): Payer: Self-pay

## 2014-12-13 ENCOUNTER — Encounter (HOSPITAL_COMMUNITY)
Admission: RE | Disposition: A | Discharge status: Home / Self Care | Payer: Self-pay | Source: Ambulatory Visit | Attending: Internal Medicine

## 2014-12-13 ENCOUNTER — Encounter (HOSPITAL_COMMUNITY): Payer: Self-pay

## 2014-12-13 ENCOUNTER — Ambulatory Visit (HOSPITAL_COMMUNITY)
Admission: RE | Admit: 2014-12-13 | Discharge: 2014-12-13 | Disposition: A | Discharge status: Home / Self Care | Payer: BLUE CROSS/BLUE SHIELD | Source: Ambulatory Visit | Attending: Internal Medicine | Admitting: Internal Medicine

## 2014-12-13 DIAGNOSIS — Z1211 Encounter for screening for malignant neoplasm of colon: Secondary | ICD-10-CM | POA: Diagnosis not present

## 2014-12-13 DIAGNOSIS — R161 Splenomegaly, not elsewhere classified: Secondary | ICD-10-CM | POA: Insufficient documentation

## 2014-12-13 DIAGNOSIS — Z860100 Personal history of colon polyps, unspecified: Secondary | ICD-10-CM

## 2014-12-13 DIAGNOSIS — D124 Benign neoplasm of descending colon: Secondary | ICD-10-CM | POA: Diagnosis not present

## 2014-12-13 DIAGNOSIS — K7581 Nonalcoholic steatohepatitis (NASH): Secondary | ICD-10-CM | POA: Diagnosis not present

## 2014-12-13 DIAGNOSIS — Z79899 Other long term (current) drug therapy: Secondary | ICD-10-CM | POA: Insufficient documentation

## 2014-12-13 DIAGNOSIS — K7469 Other cirrhosis of liver: Secondary | ICD-10-CM

## 2014-12-13 DIAGNOSIS — K648 Other hemorrhoids: Secondary | ICD-10-CM | POA: Diagnosis not present

## 2014-12-13 DIAGNOSIS — G4730 Sleep apnea, unspecified: Secondary | ICD-10-CM | POA: Diagnosis not present

## 2014-12-13 DIAGNOSIS — D696 Thrombocytopenia, unspecified: Secondary | ICD-10-CM | POA: Insufficient documentation

## 2014-12-13 DIAGNOSIS — D125 Benign neoplasm of sigmoid colon: Secondary | ICD-10-CM | POA: Diagnosis not present

## 2014-12-13 DIAGNOSIS — K2970 Gastritis, unspecified, without bleeding: Secondary | ICD-10-CM

## 2014-12-13 DIAGNOSIS — D123 Benign neoplasm of transverse colon: Secondary | ICD-10-CM | POA: Insufficient documentation

## 2014-12-13 DIAGNOSIS — K2990 Gastroduodenitis, unspecified, without bleeding: Secondary | ICD-10-CM | POA: Insufficient documentation

## 2014-12-13 DIAGNOSIS — E119 Type 2 diabetes mellitus without complications: Secondary | ICD-10-CM | POA: Diagnosis not present

## 2014-12-13 DIAGNOSIS — K7460 Unspecified cirrhosis of liver: Secondary | ICD-10-CM | POA: Diagnosis not present

## 2014-12-13 DIAGNOSIS — K3189 Other diseases of stomach and duodenum: Secondary | ICD-10-CM | POA: Insufficient documentation

## 2014-12-13 HISTORY — PX: COLONOSCOPY: SHX5424

## 2014-12-13 HISTORY — PX: ESOPHAGOGASTRODUODENOSCOPY: SHX5428

## 2014-12-13 LAB — GLUCOSE, CAPILLARY: GLUCOSE-CAPILLARY: 113 mg/dL — AB (ref 65–99)

## 2014-12-13 SURGERY — COLONOSCOPY
Anesthesia: Moderate Sedation

## 2014-12-13 MED ORDER — STERILE WATER FOR IRRIGATION IR SOLN
Status: DC | PRN
  Administered 2014-12-13: 13:00:00

## 2014-12-13 MED ORDER — MEPERIDINE HCL 50 MG/ML IJ SOLN
INTRAMUSCULAR | Status: AC
  Filled 2014-12-13: qty 1

## 2014-12-13 MED ORDER — MIDAZOLAM HCL 5 MG/5ML IJ SOLN
INTRAMUSCULAR | Status: AC
  Filled 2014-12-13: qty 10

## 2014-12-13 MED ORDER — SIMETHICONE 40 MG/0.6ML PO SUSP
ORAL | Status: AC
  Filled 2014-12-13: qty 30

## 2014-12-13 MED ORDER — BUTAMBEN-TETRACAINE-BENZOCAINE 2-2-14 % EX AERO
INHALATION_SPRAY | CUTANEOUS | Status: DC | PRN
Start: 2014-12-13 — End: 2014-12-13
  Administered 2014-12-13: 2 via TOPICAL

## 2014-12-13 MED ORDER — MIDAZOLAM HCL 5 MG/5ML IJ SOLN
INTRAMUSCULAR | Status: DC | PRN
  Administered 2014-12-13 (×3): 2 mg via INTRAVENOUS
  Administered 2014-12-13: 1 mg via INTRAVENOUS
  Administered 2014-12-13: 2 mg via INTRAVENOUS
  Administered 2014-12-13: 1 mg via INTRAVENOUS

## 2014-12-13 MED ORDER — SODIUM CHLORIDE 0.9 % IV SOLN
INTRAVENOUS | Status: DC
  Administered 2014-12-13: 12:00:00 via INTRAVENOUS

## 2014-12-13 MED ORDER — MEPERIDINE HCL 50 MG/ML IJ SOLN
INTRAMUSCULAR | Status: DC | PRN
  Administered 2014-12-13 (×3): 25 mg via INTRAVENOUS

## 2014-12-13 NOTE — Discharge Instructions (Signed)
No aspirin or NSAIDs for 10 days. Resume usual medications and diet. No driving for 24 hours. Patient will call with results of blood tests and biopsy.      Colonoscopy, Care After These instructions give you information on caring for yourself after your procedure. Your doctor may also give you more specific instructions. Call your doctor if you have any problems or questions after your procedure. HOME CARE  Do not drive for 24 hours.  Do not sign important papers or use machinery for 24 hours.  You may shower.  You may go back to your usual activities, but go slower for the first 24 hours.  Take rest breaks often during the first 24 hours.  Walk around or use warm packs on your belly (abdomen) if you have belly cramping or gas.  Drink enough fluids to keep your pee (urine) clear or pale yellow.  Resume your normal diet. Avoid heavy or fried foods.  Avoid drinking alcohol for 24 hours or as told by your doctor.  Only take medicines as told by your doctor. If a tissue sample (biopsy) was taken during the procedure:   Do not take aspirin or blood thinners for 7 days, or as told by your doctor.  Do not drink alcohol for 7 days, or as told by your doctor.  Eat soft foods for the first 24 hours. GET HELP IF: You still have a small amount of blood in your poop (stool) 2-3 days after the procedure. GET HELP RIGHT AWAY IF:  You have more than a small amount of blood in your poop.  You see clumps of tissue (blood clots) in your poop.  Your belly is puffy (swollen).  You feel sick to your stomach (nauseous) or throw up (vomit).  You have a fever.  You have belly pain that gets worse and medicine does not help. MAKE SURE YOU:  Understand these instructions.  Will watch your condition.  Will get help right away if you are not doing well or get worse. Document Released: 05/08/2010 Document Revised: 04/10/2013 Document Reviewed: 12/11/2012 [facility] Patient Information  2015 [place], [place]. This information is not intended to replace advice given to you by your health care provider. Make sure you discuss any questions you have with your health care provider.    Esophagogastroduodenoscopy Care After Refer to this sheet in the next few weeks. These instructions provide you with information on caring for yourself after your procedure. Your caregiver may also give you more specific instructions. Your treatment has been planned according to current medical practices, but problems sometimes occur. Call your caregiver if you have any problems or questions after your procedure.  HOME CARE INSTRUCTIONS  Do not eat or drink anything until the numbing medicine (local anesthetic) has worn off and your gag reflex has returned. You will know that the local anesthetic has worn off when you can swallow comfortably.  Do not drive for 12 hours after the procedure or as directed by your caregiver.  Only take medicines as directed by your caregiver. SEEK MEDICAL CARE IF:   You cannot stop coughing.  You are not urinating at all or less than usual. SEEK IMMEDIATE MEDICAL CARE IF:  You have difficulty swallowing.  You cannot eat or drink.  You have worsening throat or chest pain.  You have dizziness, lightheadedness, or you faint.  You have nausea or vomiting.  You have chills.  You have a fever.  You have severe abdominal pain.  You have black,  tarry, or bloody stools. Document Released: 03/22/2012 Document Reviewed: 03/22/2012 [facility] Patient Information 2015 [place]. This information is not intended to replace advice given to you by your health care provider. Make sure you discuss any questions you have with your health care provider.   Colon Polyps Polyps are lumps of extra tissue growing inside the body. Polyps can grow in the large intestine (colon). Most colon polyps are noncancerous (benign). However, some colon polyps can become cancerous over  time. Polyps that are larger than a pea may be harmful. To be safe, caregivers remove and test all polyps. CAUSES  Polyps form when mutations in the genes cause your cells to grow and divide even though no more tissue is needed. RISK FACTORS There are a number of risk factors that can increase your chances of getting colon polyps. They include:  Being older than 50 years.  Family history of colon polyps or colon cancer.  Long-term colon diseases, such as colitis or Crohn disease.  Being overweight.  Smoking.  Being inactive.  Drinking too much alcohol. SYMPTOMS  Most small polyps do not cause symptoms. If symptoms are present, they may include:  Blood in the stool. The stool may look dark red or black.  Constipation or diarrhea that lasts longer than 1 week. DIAGNOSIS People often do not know they have polyps until their caregiver finds them during a regular checkup. Your caregiver can use 4 tests to check for polyps:  Digital rectal exam. The caregiver wears gloves and feels inside the rectum. This test would find polyps only in the rectum.  Barium enema. The caregiver puts a liquid called barium into your rectum before taking X-rays of your colon. Barium makes your colon look white. Polyps are dark, so they are easy to see in the X-ray pictures.  Sigmoidoscopy. A thin, flexible tube (sigmoidoscope) is placed into your rectum. The sigmoidoscope has a light and tiny camera in it. The caregiver uses the sigmoidoscope to look at the last third of your colon.  Colonoscopy. This test is like sigmoidoscopy, but the caregiver looks at the entire colon. This is the most common method for finding and removing polyps. TREATMENT  Any polyps will be removed during a sigmoidoscopy or colonoscopy. The polyps are then tested for cancer. PREVENTION  To help lower your risk of getting more colon polyps:  Eat plenty of fruits and vegetables. Avoid eating fatty foods.  Do not smoke.  Avoid  drinking alcohol.  Exercise every day.  Lose weight if recommended by your caregiver.  Eat plenty of calcium and folate. Foods that are rich in calcium include milk, cheese, and broccoli. Foods that are rich in folate include chickpeas, kidney beans, and spinach. HOME CARE INSTRUCTIONS Keep all follow-up appointments as directed by your caregiver. You may need periodic exams to check for polyps. SEEK MEDICAL CARE IF: You notice bleeding during a bowel movement. Document Released: 12/31/2003 Document Revised: 06/28/2011 Document Reviewed: 06/15/2011 [facility] Patient Information 2015 [place], [place]. This information is not intended to replace advice given to you by your health care provider. Make sure you discuss any questions you have with your health care provider.   Hemorrhoids Hemorrhoids are swollen veins around the rectum or anus. There are two types of hemorrhoids:   Internal hemorrhoids. These occur in the veins just inside the rectum. They may poke through to the outside and become irritated and painful.  External hemorrhoids. These occur in the veins outside the anus and can be felt as  a painful swelling or hard lump near the anus. CAUSES  Pregnancy.   Obesity.   Constipation or diarrhea.   Straining to have a bowel movement.   Sitting for long periods on the toilet.  Heavy lifting or other activity that caused you to strain.  Anal intercourse. SYMPTOMS   Pain.   Anal itching or irritation.   Rectal bleeding.   Fecal leakage.   Anal swelling.   One or more lumps around the anus.  DIAGNOSIS  Your caregiver may be able to diagnose hemorrhoids by visual examination. Other examinations or tests that may be performed include:   Examination of the rectal area with a gloved hand (digital rectal exam).   Examination of anal canal using a small tube (scope).   A blood test if you have lost a significant amount of blood.  A test to look inside the  colon (sigmoidoscopy or colonoscopy). TREATMENT Most hemorrhoids can be treated at home. However, if symptoms do not seem to be getting better or if you have a lot of rectal bleeding, your caregiver may perform a procedure to help make the hemorrhoids get smaller or remove them completely. Possible treatments include:   Placing a rubber band at the base of the hemorrhoid to cut off the circulation (rubber band ligation).   Injecting a chemical to shrink the hemorrhoid (sclerotherapy).   Using a tool to burn the hemorrhoid (infrared light therapy).   Surgically removing the hemorrhoid (hemorrhoidectomy).   Stapling the hemorrhoid to block blood flow to the tissue (hemorrhoid stapling).  HOME CARE INSTRUCTIONS   Eat foods with fiber, such as whole grains, beans, nuts, fruits, and vegetables. Ask your doctor about taking products with added fiber in them (fibersupplements).  Increase fluid intake. Drink enough water and fluids to keep your urine clear or pale yellow.   Exercise regularly.   Go to the bathroom when you have the urge to have a bowel movement. Do not wait.   Avoid straining to have bowel movements.   Keep the anal area dry and clean. Use wet toilet paper or moist towelettes after a bowel movement.   Medicated creams and suppositories may be used or applied as directed.   Only take over-the-counter or prescription medicines as directed by your caregiver.   Take warm sitz baths for 15-20 minutes, 3-4 times a day to ease pain and discomfort.   Place ice packs on the hemorrhoids if they are tender and swollen. Using ice packs between sitz baths may be helpful.   Put ice in a plastic bag.   Place a towel between your skin and the bag.   Leave the ice on for 15-20 minutes, 3-4 times a day.   Do not use a donut-shaped pillow or sit on the toilet for long periods. This increases blood pooling and pain.  SEEK MEDICAL CARE IF:  You have increasing pain  and swelling that is not controlled by treatment or medicine.  You have uncontrolled bleeding.  You have difficulty or you are unable to have a bowel movement.  You have pain or inflammation outside the area of the hemorrhoids. MAKE SURE YOU:  Understand these instructions.  Will watch your condition.  Will get help right away if you are not doing well or get worse. Document Released: 04/02/2000 Document Revised: 03/22/2012 Document Reviewed: 02/08/2012 [facility] Patient Information 2015 [place], [place]. This information is not intended to replace advice given to you by your health care provider. Make sure you discuss any  questions you have with your health care provider.

## 2014-12-13 NOTE — Op Note (Signed)
EGD PROCEDURE REPORT  PATIENT:  Cody Austin  MR#:  [id number] Birthdate:  21-May-1950, 64 y.o., male Endoscopist:  Dr. Rogene Houston, MD Referred By:  Dr. Everardo All, MD Procedure Date: 12/13/2014  Procedure:   EGD & Colonoscopy  Indications:  Patient is 64 year old Caucasian male with new diagnosis of cirrhosis secondary to NAFLD who has portal hypertension. He is undergoing EGD with esophageal variceal banding if varices are grade III or IV. He has history of colonic adenomas and is also undergoing surveillance colonoscopy. Last colonoscopy was in 2007.            Informed Consent:  The risks, benefits, alternatives & imponderables which include, but are not limited to, bleeding, infection, perforation, drug reaction and potential missed lesion have been reviewed.  The potential for biopsy, lesion removal, esophageal dilation, etc. have also been discussed.  Questions have been answered.  All parties agreeable.  Please see history & physical in medical record for more information.  Medications:  Demerol 75 mg IV Versed 10 mg IV Cetacaine spray topically for oropharyngeal anesthesia  EGD  Description of procedure:  The endoscope was introduced through the mouth and advanced to the second portion of the duodenum without difficulty or limitations. The mucosal surfaces were surveyed very carefully during advancement of the scope and upon withdrawal.  Findings:  Esophagus:  Mucosa of the esophagus was normal. No varices were present. GE junction was unremarkable. GEJ:  42 cm Stomach:  Stomach was empty and distended very well with insufflation. Folds in the proximal stomach were normal. Examination of mucosa at gastric body and fundus revealed most that pattern or snake skinning. Single antral erosion noted and prepyloric region. Pyloric channel was patent. Angularis was unremarkable. No fundal varices were present. Duodenum:  Patchy bulbar edema and erythema with single erosion. Post  bulbar mucosa was normal.  Therapeutic/Diagnostic Maneuvers Performed:  None  COLONOSCOPY Description of procedure:  After a digital rectal exam was performed, that colonoscope was advanced from the anus through the rectum and colon to the area of the cecum, ileocecal valve and appendiceal orifice. The cecum was deeply intubated. These structures were well-seen and photographed for the record. From the level of the cecum and ileocecal valve, the scope was slowly and cautiously withdrawn. The mucosal surfaces were carefully surveyed utilizing scope tip to flexion to facilitate fold flattening as needed. The scope was pulled down into the rectum where a thorough exam including retroflexion was performed. Ultra Slim scope was also used for this procedure.  Findings:  Prep satisfactory. He had stool coating mucosa of cecum but landmarks well seen. 5 mm polyp was hot snared from transverse colon. 7 mm polyp hot snared from splenic flexure. Small polyp at descending colon was removed with cold biopsy forceps. These polyps were submitted together. 6 mm polyp hot snared from mid sigmoid colon and was lost. Small polyp ablated via cold biopsy from mid sigmoid colon. There was continued who was from biopsy site controlled with application of single 967 clip. Normal rectal mucosa. Prominent hemorrhoids below the dentate line.  Therapeutic/Diagnostic Maneuvers Performed: See above  Complications:  None  EBL: Minimal  Cecal Withdrawal Time:  23 minutes  Impression:  EGD findings: No evidence of esophageal or gastric varices. Mild portal gastropathy. Erosive gastroduodenitis.  Colonoscopy findings: Examination performed to cecum. 5 mm polyp hot snared from transverse colon. 7 mm polyp hot snared from splenic flexure. Small polyp was removed via cold biopsy from descending colon. These polyps  were submitted in one container.  Small polyp was ablated via cold biopsy from mid sigmoid colon.  360 clipped applied to biopsy site for hemostasis. 6 mm polyp hot snared from sigmoid colon was lost. External hemorrhoids.  Recommendations:  Standard instructions given. H. pylori serology. No aspirin or NSAIDs for 10 days. I will be contacting patient results of blood tests and biopsy.  Cody Austin U  12/13/2014 2:57 PM  CC: Dr. Hilma Favors, Betsy Coder, MD & Dr. Rayne Du ref. provider found

## 2014-12-13 NOTE — H&P (Signed)
Cody Austin is an 64 y.o. male.   Chief Complaint: She was here for EGD possible variceal banding and colonoscopy. HPI: She is 64 year old Caucasian male with recent diagnosis of cirrhosis secondary to steatohepatitis was undergoing EGD with esophageal variceal banding if he has large polyps. He has splenomegaly and thrombocytopenia. Has history of colonic adenomas and is also undergoing surveillance colonoscopy. Family history is negative for CRC.  Past Medical History  Diagnosis Date  . Heart murmur   . Sleep apnea   . Arthritis   . Diabetes mellitus without complication     Pre-Diabetes per MD  . Other pancytopenia 11/19/2014    Past Surgical History  Procedure Laterality Date  . Hernia repair Right     Inguinal hernia  . Cataract extraction w/phaco Right 08/12/2014    Procedure: CATARACT EXTRACTION PHACO AND INTRAOCULAR LENS PLACEMENT (IOC);  Surgeon: [name], MD;  Location: [facility];  Service: Ophthalmology;  Laterality: Right;  CDE 9.32  . Cataract extraction w/phaco Left 08/26/2014    Procedure: CATARACT EXTRACTION PHACO AND INTRAOCULAR LENS PLACEMENT (IOC);  Surgeon: [name], MD;  Location: [facility];  Service: Ophthalmology;  Laterality: Left;  CDE:  9.49    Family History  Problem Relation Age of Onset  . Cancer Father   . Aneurysm Father    Social History:  reports that he has never smoked. He has never used smokeless tobacco. He reports that he drinks alcohol. He reports that he does not use illicit drugs.  Allergies: No Known Allergies  Medications Prior to Admission  Medication Sig Dispense Refill  . atorvastatin (LIPITOR) 40 MG tablet Take 40 mg by mouth daily.    . fenofibrate (TRICOR) 145 MG tablet Take 145 mg by mouth daily.    . metFORMIN (GLUCOPHAGE) 500 MG tablet Take 500 mg by mouth daily with breakfast. Patient states that he takes at night.    . polyethylene glycol-electrolytes (NULYTELY/GOLYTELY) 420 G solution Take 4,000 mLs by mouth once. 4000 mL 0     Results for orders placed or performed during the hospital encounter of 12/13/14 (from the past 48 hour(s))  Glucose, capillary     Status: Abnormal   Collection Time: 12/13/14 12:20 PM  Result Value Ref Range   Glucose-Capillary 113 (H) 65 - 99 mg/dL   No results found.  ROS  Blood pressure 129/75, pulse 87, temperature 98.6 F (37 C), temperature source Oral, resp. rate 18, height 5' 6.5" (1.689 m), weight 220 lb (99.791 kg), SpO2 98 %. Physical Exam  Constitutional: He appears well-developed and well-nourished.  HENT:  Mouth/Throat: Oropharynx is clear and moist.  Eyes: Conjunctivae are normal. No scleral icterus.  Neck: No thyromegaly present.  Cardiovascular: Normal rate and regular rhythm.   Murmur (faint SEM at LLSB) heard. Respiratory: Effort normal and breath sounds normal. Respiratory distress: pain systolic ejection murmur best heard at left sternal border.  GI: Soft. He exhibits no distension and no mass. There is no tenderness.  Musculoskeletal: He exhibits no edema.  Lymphadenopathy:    He has no cervical adenopathy.  Neurological: He is alert.  Skin: Skin is warm and dry.     Assessment/Plan Cirrhosis secondary to NAFLD. History of colonic adenomas. EGD with possible esophageal variceal banding. Surveillance colonoscopy  Maresa Morash U 12/13/2014, 1:19 PM

## 2014-12-14 LAB — H. PYLORI ANTIBODY, IGG

## 2014-12-17 ENCOUNTER — Encounter (HOSPITAL_COMMUNITY): Payer: Self-pay | Admitting: Internal Medicine

## 2014-12-24 ENCOUNTER — Telehealth (INDEPENDENT_AMBULATORY_CARE_PROVIDER_SITE_OTHER): Payer: Self-pay | Admitting: *Deleted

## 2014-12-24 DIAGNOSIS — K2990 Gastroduodenitis, unspecified, without bleeding: Principal | ICD-10-CM

## 2014-12-24 DIAGNOSIS — K2970 Gastritis, unspecified, without bleeding: Secondary | ICD-10-CM

## 2014-12-24 NOTE — Telephone Encounter (Signed)
[place] noted in recall for 05/2015

## 2014-12-24 NOTE — Telephone Encounter (Signed)
Per Dr.Rehman the patient will need to have labs drawn in 05/2015. Also patient is to have a Ultrasound at this time.

## 2014-12-26 ENCOUNTER — Other Ambulatory Visit (HOSPITAL_COMMUNITY): Payer: Self-pay | Admitting: Family Medicine

## 2014-12-26 ENCOUNTER — Ambulatory Visit (HOSPITAL_COMMUNITY)
Admission: RE | Admit: 2014-12-26 | Discharge: 2014-12-26 | Disposition: A | Discharge status: Home / Self Care | Payer: BLUE CROSS/BLUE SHIELD | Source: Ambulatory Visit | Attending: Family Medicine | Admitting: Family Medicine

## 2014-12-26 DIAGNOSIS — N200 Calculus of kidney: Secondary | ICD-10-CM

## 2014-12-27 ENCOUNTER — Ambulatory Visit (HOSPITAL_COMMUNITY)
Admission: RE | Admit: 2014-12-27 | Discharge: 2014-12-27 | Disposition: A | Discharge status: Home / Self Care | Payer: BLUE CROSS/BLUE SHIELD | Source: Ambulatory Visit | Attending: Urology | Admitting: Urology

## 2014-12-27 ENCOUNTER — Ambulatory Visit (INDEPENDENT_AMBULATORY_CARE_PROVIDER_SITE_OTHER): Payer: BLUE CROSS/BLUE SHIELD | Admitting: Urology

## 2014-12-27 ENCOUNTER — Other Ambulatory Visit: Payer: Self-pay | Admitting: Urology

## 2014-12-27 DIAGNOSIS — N200 Calculus of kidney: Secondary | ICD-10-CM

## 2014-12-27 DIAGNOSIS — N201 Calculus of ureter: Secondary | ICD-10-CM | POA: Diagnosis not present

## 2014-12-27 DIAGNOSIS — R1031 Right lower quadrant pain: Secondary | ICD-10-CM | POA: Insufficient documentation

## 2014-12-27 DIAGNOSIS — R310 Gross hematuria: Secondary | ICD-10-CM | POA: Insufficient documentation

## 2014-12-27 DIAGNOSIS — K4090 Unilateral inguinal hernia, without obstruction or gangrene, not specified as recurrent: Secondary | ICD-10-CM | POA: Insufficient documentation

## 2014-12-27 DIAGNOSIS — N132 Hydronephrosis with renal and ureteral calculous obstruction: Secondary | ICD-10-CM | POA: Insufficient documentation

## 2014-12-27 DIAGNOSIS — R933 Abnormal findings on diagnostic imaging of other parts of digestive tract: Secondary | ICD-10-CM | POA: Diagnosis not present

## 2015-01-08 ENCOUNTER — Ambulatory Visit (INDEPENDENT_AMBULATORY_CARE_PROVIDER_SITE_OTHER): Payer: BLUE CROSS/BLUE SHIELD | Admitting: Urology

## 2015-01-08 DIAGNOSIS — N200 Calculus of kidney: Secondary | ICD-10-CM | POA: Diagnosis not present

## 2015-01-13 ENCOUNTER — Other Ambulatory Visit: Payer: Self-pay | Admitting: Urology

## 2015-02-17 ENCOUNTER — Encounter (HOSPITAL_COMMUNITY): Payer: Self-pay | Admitting: *Deleted

## 2015-02-18 ENCOUNTER — Ambulatory Visit (HOSPITAL_COMMUNITY)
Admission: RE | Admit: 2015-02-18 | Discharge: 2015-02-18 | Disposition: A | Discharge status: Home / Self Care | Payer: BLUE CROSS/BLUE SHIELD | Source: Ambulatory Visit | Attending: Urology | Admitting: Urology

## 2015-02-18 ENCOUNTER — Other Ambulatory Visit: Payer: Self-pay | Admitting: Urology

## 2015-02-18 DIAGNOSIS — N201 Calculus of ureter: Secondary | ICD-10-CM | POA: Diagnosis not present

## 2015-02-19 ENCOUNTER — Ambulatory Visit (INDEPENDENT_AMBULATORY_CARE_PROVIDER_SITE_OTHER): Payer: BLUE CROSS/BLUE SHIELD | Admitting: Urology

## 2015-02-19 DIAGNOSIS — R310 Gross hematuria: Secondary | ICD-10-CM | POA: Diagnosis not present

## 2015-02-19 DIAGNOSIS — N200 Calculus of kidney: Secondary | ICD-10-CM | POA: Diagnosis not present

## 2015-02-24 ENCOUNTER — Encounter (HOSPITAL_COMMUNITY)
Admission: RE | Disposition: A | Discharge status: Home / Self Care | Payer: Self-pay | Source: Ambulatory Visit | Attending: Urology

## 2015-02-24 ENCOUNTER — Ambulatory Visit (HOSPITAL_COMMUNITY)
Admission: RE | Admit: 2015-02-24 | Discharge: 2015-02-24 | Disposition: A | Discharge status: Home / Self Care | Payer: BLUE CROSS/BLUE SHIELD | Source: Ambulatory Visit | Attending: Urology | Admitting: Urology

## 2015-02-24 ENCOUNTER — Encounter (HOSPITAL_COMMUNITY): Payer: Self-pay | Admitting: *Deleted

## 2015-02-24 ENCOUNTER — Ambulatory Visit (HOSPITAL_COMMUNITY): Payer: BLUE CROSS/BLUE SHIELD

## 2015-02-24 DIAGNOSIS — Z9842 Cataract extraction status, left eye: Secondary | ICD-10-CM | POA: Diagnosis not present

## 2015-02-24 DIAGNOSIS — M1990 Unspecified osteoarthritis, unspecified site: Secondary | ICD-10-CM | POA: Diagnosis not present

## 2015-02-24 DIAGNOSIS — G4730 Sleep apnea, unspecified: Secondary | ICD-10-CM | POA: Diagnosis not present

## 2015-02-24 DIAGNOSIS — N189 Chronic kidney disease, unspecified: Secondary | ICD-10-CM | POA: Insufficient documentation

## 2015-02-24 DIAGNOSIS — Z9841 Cataract extraction status, right eye: Secondary | ICD-10-CM | POA: Insufficient documentation

## 2015-02-24 DIAGNOSIS — E1122 Type 2 diabetes mellitus with diabetic chronic kidney disease: Secondary | ICD-10-CM | POA: Diagnosis not present

## 2015-02-24 DIAGNOSIS — Z87442 Personal history of urinary calculi: Secondary | ICD-10-CM | POA: Insufficient documentation

## 2015-02-24 DIAGNOSIS — N200 Calculus of kidney: Secondary | ICD-10-CM | POA: Insufficient documentation

## 2015-02-24 HISTORY — DX: Chronic kidney disease, unspecified: N18.9

## 2015-02-24 HISTORY — DX: Fatty (change of) liver, not elsewhere classified: K76.0

## 2015-02-24 LAB — GLUCOSE, CAPILLARY: GLUCOSE-CAPILLARY: 153 mg/dL — AB (ref 65–99)

## 2015-02-24 SURGERY — LITHOTRIPSY, ESWL
Anesthesia: LOCAL | Laterality: Left

## 2015-02-24 MED ORDER — SODIUM CHLORIDE 0.9 % IV SOLN
INTRAVENOUS | Status: DC
  Administered 2015-02-24: 08:00:00 via INTRAVENOUS

## 2015-02-24 MED ORDER — DIPHENHYDRAMINE HCL 25 MG PO CAPS
25.0000 mg | ORAL_CAPSULE | ORAL | Status: AC
  Administered 2015-02-24: 25 mg via ORAL
  Filled 2015-02-24: qty 1

## 2015-02-24 MED ORDER — DIAZEPAM 5 MG PO TABS
10.0000 mg | ORAL_TABLET | ORAL | Status: AC
  Administered 2015-02-24: 10 mg via ORAL
  Filled 2015-02-24: qty 2

## 2015-02-24 MED ORDER — CIPROFLOXACIN HCL 500 MG PO TABS
500.0000 mg | ORAL_TABLET | ORAL | Status: AC
  Administered 2015-02-24: 500 mg via ORAL
  Filled 2015-02-24: qty 1

## 2015-02-24 NOTE — Discharge Instructions (Signed)
See [facility] discharge instructions in chart.  

## 2015-02-24 NOTE — Progress Notes (Signed)
Returned to short  Stay from [employer] truck . Alert. Small inflammed area Lt flank

## 2015-02-24 NOTE — H&P (Signed)
Urology History and Physical Exam  CC: Left kidney stone  HPI: 64 year old male presents for ESL of a 7 mm left upper pole stone. He has previously passed a right ureteral stone. In followup, he has requested management of his residual calculous disease. Dr. Alyson Ingles discussed management options, risk/complications. He has chosen ESWL.  PMH: Past Medical History  Diagnosis Date  . Heart murmur   . Arthritis   . Diabetes mellitus without complication ([place])     Pre-Diabetes per MD  . Other pancytopenia ([place]) 11/19/2014  . Sleep apnea     uses CPAP  . Fatty liver   . Chronic kidney disease     left renal stone    PSH: Past Surgical History  Procedure Laterality Date  . Hernia repair Right     Inguinal hernia  . Cataract extraction w/phaco Right 08/12/2014    Procedure: CATARACT EXTRACTION PHACO AND INTRAOCULAR LENS PLACEMENT (IOC);  Surgeon: [name], MD;  Location: [facility];  Service: Ophthalmology;  Laterality: Right;  CDE 9.32  . Cataract extraction w/phaco Left 08/26/2014    Procedure: CATARACT EXTRACTION PHACO AND INTRAOCULAR LENS PLACEMENT (IOC);  Surgeon: [name], MD;  Location: [facility];  Service: Ophthalmology;  Laterality: Left;  CDE:  9.49  . Colonoscopy N/A 12/13/2014    Procedure: COLONOSCOPY;  Surgeon: Rogene Houston, MD;  Location: [facility];  Service: Endoscopy;  Laterality: N/A;  100  . Esophagogastroduodenoscopy N/A 12/13/2014    Procedure: ESOPHAGOGASTRODUODENOSCOPY (EGD);  Surgeon: Rogene Houston, MD;  Location: [facility];  Service: Endoscopy;  Laterality: N/A;    Allergies: No Known Allergies  Medications: No prescriptions prior to admission     Social History: Social History   Social History  . Marital Status: Married    Spouse Name: N/A  . Number of Children: N/A  . Years of Education: N/A   Occupational History  . Not on file.   Social History Main Topics  . Smoking status: Never Smoker   . Smokeless tobacco: Never Used  . Alcohol  Use: 0.0 oz/week    0 Standard drinks or equivalent per week     Comment: occ  . Drug Use: No  . Sexual Activity: Not on file   Other Topics Concern  . Not on file   Social History Narrative    Family History: Family History  Problem Relation Age of Onset  . Cancer Father   . Aneurysm Father     Genitourinary, constitutional, skin, eye, otolaryngeal, hematologic/lymphatic, cardiovascular, pulmonary, endocrine, musculoskeletal, gastrointestinal, neurological and psychiatric system(s) were reviewed and pertinent findings if present are noted and are otherwise negative.                   Physical Exam: Constitutional: Well nourished and well developed.   ENT:. The ears and nose are normal in appearance. The oropharynx is normal.   Neck: The appearance of the neck is normal and no neck mass is present.   Pulmonary: No respiratory distress and normal respiratory rhythm and effort.   Cardiovascular:. The arterial pulses are normal. No peripheral edema.   Abdomen: The abdomen is mildly obese. The abdomen is soft and nontender. No masses are palpated. No hernias are palpable. No hepatosplenomegaly noted.   Lymphatics: The posterior cervical and anterior cervical nodes are not enlarged or tender.   Skin: Normal skin turgor, no visible rash and normal skin color and pigmentation.   Neuro/Psych:. Mood and affect are appropriate. No focal  sensory deficits.     Studies:  No results for input(s): HGB, WBC, PLT in the last 72 hours.  No results for input(s): NA, K, CL, CO2, BUN, CREATININE, CALCIUM, GFRNONAA, GFRAA in the last 72 hours.  Invalid input(s): MAGNESIUM   No results for input(s): INR, APTT in the last 72 hours.  Invalid input(s): PT   Invalid input(s): ABG    Assessment:  7 mm left renal stone  Plan: ESWL

## 2015-02-24 NOTE — Op Note (Signed)
See [employer] OP note scanned into chart.

## 2015-03-19 ENCOUNTER — Ambulatory Visit (INDEPENDENT_AMBULATORY_CARE_PROVIDER_SITE_OTHER): Payer: Self-pay | Admitting: Urology

## 2015-03-19 DIAGNOSIS — N200 Calculus of kidney: Secondary | ICD-10-CM

## 2015-03-20 ENCOUNTER — Ambulatory Visit (HOSPITAL_COMMUNITY)
Admission: RE | Admit: 2015-03-20 | Discharge: 2015-03-20 | Disposition: A | Discharge status: Home / Self Care | Payer: BLUE CROSS/BLUE SHIELD | Source: Ambulatory Visit | Attending: Urology | Admitting: Urology

## 2015-03-20 ENCOUNTER — Other Ambulatory Visit: Payer: Self-pay | Admitting: Urology

## 2015-03-20 DIAGNOSIS — N200 Calculus of kidney: Secondary | ICD-10-CM | POA: Insufficient documentation

## 2015-04-01 ENCOUNTER — Encounter (INDEPENDENT_AMBULATORY_CARE_PROVIDER_SITE_OTHER): Payer: Self-pay | Admitting: Internal Medicine

## 2015-04-01 ENCOUNTER — Ambulatory Visit (INDEPENDENT_AMBULATORY_CARE_PROVIDER_SITE_OTHER): Payer: BLUE CROSS/BLUE SHIELD | Admitting: Internal Medicine

## 2015-04-01 VITALS — BP 120/82 | HR 72 | Temp 98.1°F | Resp 18 | Ht 67.0 in | Wt 217.8 lb

## 2015-04-01 DIAGNOSIS — K7460 Unspecified cirrhosis of liver: Secondary | ICD-10-CM | POA: Diagnosis not present

## 2015-04-01 NOTE — Progress Notes (Signed)
Presenting complaint;  Follow-up for chronic liver disease.  Subjective:  Patient is 64 year old Caucasian male who was recently diagnosed with cirrhosis and was last seen on 11/19/2014. He returns for follow-up visit. Following his last visit he underwent EGD and colonoscopy. He states he also had lithotripsy for left urolithiasis. He has lost 8 pounds since his last visit. He states weight loss occurred while he was sick with urolithiasis. He is trying to stay active. He has been bike riding. He is not able to walk a lot because of knee pain. He denies abdominal pain melena or rectal bleeding heartburn or dysphagia.    Current Medications: Outpatient Encounter Prescriptions as of 04/01/2015  Medication Sig  . fenofibrate (TRICOR) 145 MG tablet Take 160 mg by mouth daily.   . metFORMIN (GLUCOPHAGE) 500 MG tablet Take 500 mg by mouth daily with breakfast.   . simvastatin (ZOCOR) 40 MG tablet Take 40 mg by mouth at bedtime.  . [DISCONTINUED] atorvastatin (LIPITOR) 40 MG tablet Take 40 mg by mouth at bedtime.   . [DISCONTINUED] HYDROcodone-acetaminophen (NORCO/VICODIN) 5-325 MG tablet Take 1-2 tablets by mouth every 4 (four) hours as needed for moderate pain.   No facility-administered encounter medications on file as of 04/01/2015.     Objective: Blood pressure 120/82, pulse 72, temperature 98.1 F (36.7 C), temperature source Oral, resp. rate 18, height 5' 7"  (1.702 m), weight 217 lb 12.8 oz (98.793 kg). Patient is alert and in no acute distress. He does not have asterixis. Conjunctiva is pink. Sclera is nonicteric Oropharyngeal mucosa is normal. No neck masses or thyromegaly noted. Cardiac exam with regular rhythm normal S1 and S2. No murmur or gallop noted. Lungs are clear to auscultation. Abdomen is protuberant but soft and nontender without organomegaly or masses. No LE edema or clubbing noted.  Labs/studies Results: Blood work from Dr. Delanna Ahmadi office requested it was done  last month.   AFP was 7.2 on 11/19/2014. Abdominal CT on 11/04/2014 revealed cirrhosis with splenomegaly and distal esophageal varices as well as cholelithiasis and left renal calculus.   Assessment:  #1. Cirrhosis secondary to NAFLD. He has well preserved hepatic function. He has received first two doses of Hep  A and B vaccination and he will have third dose in February next year. He will be due for [facility] screening in February next year but would like to wait until after May 2017. EGD on 12/13/2014 revealed portal gastropathy but no evidence of esophageal or gastric varices. He had erosive gastroduodenitis but H. pylori serology was negative. #2. Thrombocytopenia secondary to cirrhosis and splenomegaly. #3. History of colonic adenomas. Last colonoscopy was in 12/13/2014 and the next one will be in August 2021.   Plan:  Patient encouraged to stay busy and prior walk at least 3-4 times a week if tolerated or consider other exercises such as swimming. Will request recent blood work from Dr. Delanna Ahmadi office for review. AFP in February 2017. Upper abdominal ultrasound with Doppler in May 2017. Office visit in one year.

## 2015-04-01 NOTE — Patient Instructions (Signed)
Will request copy of recent blood work from Dr. Delanna Ahmadi office. Alpha-fetoprotein to be done in February 2017 unless done by Dr. Hilma Favors. Liver ultrasound in May 2017.

## 2015-05-08 ENCOUNTER — Encounter (INDEPENDENT_AMBULATORY_CARE_PROVIDER_SITE_OTHER): Payer: Self-pay

## 2015-05-12 ENCOUNTER — Other Ambulatory Visit (INDEPENDENT_AMBULATORY_CARE_PROVIDER_SITE_OTHER): Payer: Self-pay | Admitting: *Deleted

## 2015-05-12 ENCOUNTER — Encounter (INDEPENDENT_AMBULATORY_CARE_PROVIDER_SITE_OTHER): Payer: Self-pay | Admitting: *Deleted

## 2015-05-12 DIAGNOSIS — K2970 Gastritis, unspecified, without bleeding: Secondary | ICD-10-CM

## 2015-05-12 DIAGNOSIS — K2990 Gastroduodenitis, unspecified, without bleeding: Principal | ICD-10-CM

## 2015-08-28 DIAGNOSIS — K2970 Gastritis, unspecified, without bleeding: Secondary | ICD-10-CM | POA: Diagnosis not present

## 2015-08-28 DIAGNOSIS — E782 Mixed hyperlipidemia: Secondary | ICD-10-CM | POA: Diagnosis not present

## 2015-08-28 DIAGNOSIS — Z125 Encounter for screening for malignant neoplasm of prostate: Secondary | ICD-10-CM | POA: Diagnosis not present

## 2015-08-28 DIAGNOSIS — K7469 Other cirrhosis of liver: Secondary | ICD-10-CM | POA: Diagnosis not present

## 2015-08-28 DIAGNOSIS — E6609 Other obesity due to excess calories: Secondary | ICD-10-CM | POA: Diagnosis not present

## 2015-08-28 DIAGNOSIS — Z1389 Encounter for screening for other disorder: Secondary | ICD-10-CM | POA: Diagnosis not present

## 2015-08-28 DIAGNOSIS — E119 Type 2 diabetes mellitus without complications: Secondary | ICD-10-CM | POA: Diagnosis not present

## 2015-08-28 DIAGNOSIS — Z86010 Personal history of colonic polyps: Secondary | ICD-10-CM | POA: Diagnosis not present

## 2015-08-28 DIAGNOSIS — K2990 Gastroduodenitis, unspecified, without bleeding: Secondary | ICD-10-CM | POA: Diagnosis not present

## 2015-08-28 DIAGNOSIS — Z6834 Body mass index (BMI) 34.0-34.9, adult: Secondary | ICD-10-CM | POA: Diagnosis not present

## 2015-08-28 DIAGNOSIS — I10 Essential (primary) hypertension: Secondary | ICD-10-CM | POA: Diagnosis not present

## 2015-09-02 ENCOUNTER — Encounter (INDEPENDENT_AMBULATORY_CARE_PROVIDER_SITE_OTHER): Payer: Self-pay | Admitting: *Deleted

## 2015-09-16 DIAGNOSIS — H524 Presbyopia: Secondary | ICD-10-CM | POA: Diagnosis not present

## 2015-09-16 DIAGNOSIS — H52223 Regular astigmatism, bilateral: Secondary | ICD-10-CM | POA: Diagnosis not present

## 2015-09-16 DIAGNOSIS — E119 Type 2 diabetes mellitus without complications: Secondary | ICD-10-CM | POA: Diagnosis not present

## 2015-09-16 DIAGNOSIS — H5213 Myopia, bilateral: Secondary | ICD-10-CM | POA: Diagnosis not present

## 2015-09-17 ENCOUNTER — Other Ambulatory Visit (INDEPENDENT_AMBULATORY_CARE_PROVIDER_SITE_OTHER): Payer: Self-pay | Admitting: Internal Medicine

## 2015-09-17 DIAGNOSIS — D696 Thrombocytopenia, unspecified: Secondary | ICD-10-CM | POA: Diagnosis not present

## 2015-09-17 DIAGNOSIS — E6609 Other obesity due to excess calories: Secondary | ICD-10-CM | POA: Diagnosis not present

## 2015-09-17 DIAGNOSIS — K7469 Other cirrhosis of liver: Secondary | ICD-10-CM

## 2015-09-17 DIAGNOSIS — D72819 Decreased white blood cell count, unspecified: Secondary | ICD-10-CM | POA: Diagnosis not present

## 2015-09-17 DIAGNOSIS — K7460 Unspecified cirrhosis of liver: Secondary | ICD-10-CM | POA: Diagnosis not present

## 2015-09-18 ENCOUNTER — Ambulatory Visit (HOSPITAL_COMMUNITY)
Admission: RE | Admit: 2015-09-18 | Discharge: 2015-09-18 | Disposition: A | Discharge status: Home / Self Care | Payer: Medicare Other | Source: Ambulatory Visit | Attending: Internal Medicine | Admitting: Internal Medicine

## 2015-09-18 DIAGNOSIS — K7460 Unspecified cirrhosis of liver: Secondary | ICD-10-CM | POA: Diagnosis not present

## 2015-09-18 DIAGNOSIS — K7469 Other cirrhosis of liver: Secondary | ICD-10-CM

## 2015-09-18 DIAGNOSIS — K8020 Calculus of gallbladder without cholecystitis without obstruction: Secondary | ICD-10-CM | POA: Insufficient documentation

## 2015-09-18 DIAGNOSIS — R161 Splenomegaly, not elsewhere classified: Secondary | ICD-10-CM | POA: Diagnosis not present

## 2015-09-22 ENCOUNTER — Ambulatory Visit (HOSPITAL_COMMUNITY): Payer: BLUE CROSS/BLUE SHIELD

## 2015-09-23 ENCOUNTER — Encounter (INDEPENDENT_AMBULATORY_CARE_PROVIDER_SITE_OTHER): Payer: Self-pay | Admitting: Internal Medicine

## 2015-09-23 ENCOUNTER — Ambulatory Visit (INDEPENDENT_AMBULATORY_CARE_PROVIDER_SITE_OTHER): Payer: Medicare Other | Admitting: Internal Medicine

## 2015-09-23 VITALS — BP 120/80 | HR 66 | Temp 98.4°F | Resp 18 | Ht 67.0 in | Wt 230.6 lb

## 2015-09-23 DIAGNOSIS — K7460 Unspecified cirrhosis of liver: Secondary | ICD-10-CM | POA: Diagnosis not present

## 2015-09-23 DIAGNOSIS — R772 Abnormality of alphafetoprotein: Secondary | ICD-10-CM | POA: Diagnosis not present

## 2015-09-23 NOTE — Patient Instructions (Signed)
AFP to be done in August 2017. Next ultrasound would be in December 2017

## 2015-09-23 NOTE — Progress Notes (Signed)
Presenting complaint;  Follow-up for chronic liver disease.  Subjective:  Patient is 65 year old Caucasian male who has cirrhosis secondary to NASH and is here for scheduled visit. He was last seen in December 2016. Between his first and last visit he had lost few pounds that he has gained 13 pounds in the last 6 months. He feels well. He walks on a treadmill 10 minutes every day for half a mile each time. If he tries to walk more he gets knee pain. He denies heartburn dysphagia nausea vomiting abdominal pain melena or rectal bleeding. His bowels move daily. He has completed vaccination for hepatitis A and B. Booster dose was in February 2017. He states he had lithotripsy for left renal stone in October 2016. He was seen by Dr. Tressie Stalker last month. Thrombocytopenia was stable and he was discharged.    Current Medications: Outpatient Encounter Prescriptions as of 09/23/2015  Medication Sig  . fenofibrate (TRICOR) 145 MG tablet Take 160 mg by mouth at bedtime.   . metFORMIN (GLUCOPHAGE) 500 MG tablet Take 500 mg by mouth at bedtime.   . simvastatin (ZOCOR) 40 MG tablet Take 40 mg by mouth at bedtime.   No facility-administered encounter medications on file as of 09/23/2015.    Objective: Blood pressure 120/80, pulse 66, temperature 98.4 F (36.9 C), temperature source Oral, resp. rate 18, height 5' 7"  (1.702 m), weight 230 lb 9.6 oz (104.599 kg). Patient is alert and does not have asterixis. Conjunctiva is pink. Sclera is nonicteric Oropharyngeal mucosa is normal. No neck masses or thyromegaly noted. Cardiac exam with regular rhythm normal S1 and S2. No murmur or gallop noted. Lungs are clear to auscultation. Abdomen is full but soft and nontender without masses. Left lobe of liver is easily palpable in midepigastric region and is firm. No LE edema or clubbing noted.  Labs/studies Results: Lab data from 08/28/2015  Bilirubin 0.9, AP 70, AST 48, AST 37, total protein 7.0, albumin 4.3  and globulin 2.7. Glucose 118, BUN 12, creatinine 1.19 . Serum sodium 140, potassium 4.3, chloride 104, CO2 25 and serum calcium 9.4.   AFP 10.2. It was 7.2 on 11/19/2014.   CBC from 09/17/2015  WBC 3.3, H&H 13.5 and 40.1 and platelet count 93K   H. pylori serology negative in August 2016.   Ultrasound on 09/18/2015 revealed cirrhotic liver without any masses cholelithiasis splenomegaly and possible small nonobstructing stone in right kidney.   Assessment:  #1. Cirrhosis secondary to NASH. He remains with well preserved hepatic function. He must increase physical activity in order to lose weight and improve his prognosis. He has received hepatitis A and B vaccination. Last EGD was in August 2016 revealing portal gastropathy but no evidence of varices. #2. Mildly elevated serum AFP. Ultrasound does not reveal any suspicious lesions. Will repeat AFP level in 3 months and determine whether he needs MRI of liver. #3. Leukopenia and thrombocytopenia secondary to chronic liver disease and splenomegaly. #4. History of colonic adenomas. He had 2 colonic adenomas removed in August 2016. Next colonoscopy would be in August 2021.   Plan:  AFP in first or second week of August 2017. Next abdominal ultrasound would be in 6 months unless AFP keeps trending upwards in which case will need MRI of liver. Patient encouraged to increase physical activity as tolerated and he also needs to in order to lose weight.

## 2015-11-13 ENCOUNTER — Encounter (INDEPENDENT_AMBULATORY_CARE_PROVIDER_SITE_OTHER): Payer: Self-pay | Admitting: *Deleted

## 2015-11-13 ENCOUNTER — Other Ambulatory Visit (INDEPENDENT_AMBULATORY_CARE_PROVIDER_SITE_OTHER): Payer: Self-pay | Admitting: *Deleted

## 2015-11-13 DIAGNOSIS — R772 Abnormality of alphafetoprotein: Secondary | ICD-10-CM

## 2015-11-13 DIAGNOSIS — K7460 Unspecified cirrhosis of liver: Secondary | ICD-10-CM

## 2015-11-28 DIAGNOSIS — R772 Abnormality of alphafetoprotein: Secondary | ICD-10-CM | POA: Diagnosis not present

## 2015-11-28 DIAGNOSIS — K7460 Unspecified cirrhosis of liver: Secondary | ICD-10-CM | POA: Diagnosis not present

## 2015-11-29 LAB — AFP TUMOR MARKER: AFP-Tumor Marker: 8.2 ng/mL — ABNORMAL HIGH (ref <6.1)

## 2015-12-01 ENCOUNTER — Other Ambulatory Visit (INDEPENDENT_AMBULATORY_CARE_PROVIDER_SITE_OTHER): Payer: Self-pay | Admitting: *Deleted

## 2015-12-01 DIAGNOSIS — K7460 Unspecified cirrhosis of liver: Secondary | ICD-10-CM

## 2015-12-18 ENCOUNTER — Ambulatory Visit (INDEPENDENT_AMBULATORY_CARE_PROVIDER_SITE_OTHER): Payer: Medicare Other | Admitting: Otolaryngology

## 2015-12-18 DIAGNOSIS — H903 Sensorineural hearing loss, bilateral: Secondary | ICD-10-CM | POA: Diagnosis not present

## 2016-01-28 DIAGNOSIS — E119 Type 2 diabetes mellitus without complications: Secondary | ICD-10-CM | POA: Diagnosis not present

## 2016-01-28 DIAGNOSIS — E782 Mixed hyperlipidemia: Secondary | ICD-10-CM | POA: Diagnosis not present

## 2016-01-28 DIAGNOSIS — E6609 Other obesity due to excess calories: Secondary | ICD-10-CM | POA: Diagnosis not present

## 2016-01-28 DIAGNOSIS — Z23 Encounter for immunization: Secondary | ICD-10-CM | POA: Diagnosis not present

## 2016-01-28 DIAGNOSIS — I10 Essential (primary) hypertension: Secondary | ICD-10-CM | POA: Diagnosis not present

## 2016-01-28 DIAGNOSIS — Z6834 Body mass index (BMI) 34.0-34.9, adult: Secondary | ICD-10-CM | POA: Diagnosis not present

## 2016-01-28 DIAGNOSIS — D696 Thrombocytopenia, unspecified: Secondary | ICD-10-CM | POA: Diagnosis not present

## 2016-02-02 DIAGNOSIS — Z23 Encounter for immunization: Secondary | ICD-10-CM | POA: Diagnosis not present

## 2016-03-05 ENCOUNTER — Encounter (INDEPENDENT_AMBULATORY_CARE_PROVIDER_SITE_OTHER): Payer: Self-pay

## 2016-04-14 ENCOUNTER — Encounter (INDEPENDENT_AMBULATORY_CARE_PROVIDER_SITE_OTHER): Payer: Self-pay | Admitting: *Deleted

## 2016-04-14 ENCOUNTER — Other Ambulatory Visit (INDEPENDENT_AMBULATORY_CARE_PROVIDER_SITE_OTHER): Payer: Self-pay | Admitting: *Deleted

## 2016-04-14 DIAGNOSIS — K7460 Unspecified cirrhosis of liver: Secondary | ICD-10-CM

## 2016-04-23 ENCOUNTER — Encounter (INDEPENDENT_AMBULATORY_CARE_PROVIDER_SITE_OTHER): Payer: Self-pay | Admitting: *Deleted

## 2016-04-29 ENCOUNTER — Other Ambulatory Visit (INDEPENDENT_AMBULATORY_CARE_PROVIDER_SITE_OTHER): Payer: Self-pay | Admitting: Internal Medicine

## 2016-04-29 DIAGNOSIS — K7469 Other cirrhosis of liver: Secondary | ICD-10-CM

## 2016-05-06 ENCOUNTER — Ambulatory Visit (HOSPITAL_COMMUNITY)
Admission: RE | Admit: 2016-05-06 | Discharge: 2016-05-06 | Disposition: A | Discharge status: Home / Self Care | Payer: Medicare Other | Source: Ambulatory Visit | Attending: Internal Medicine | Admitting: Internal Medicine

## 2016-05-06 DIAGNOSIS — K769 Liver disease, unspecified: Secondary | ICD-10-CM | POA: Diagnosis not present

## 2016-05-06 DIAGNOSIS — K7460 Unspecified cirrhosis of liver: Secondary | ICD-10-CM | POA: Diagnosis not present

## 2016-05-06 DIAGNOSIS — R161 Splenomegaly, not elsewhere classified: Secondary | ICD-10-CM | POA: Diagnosis not present

## 2016-05-06 DIAGNOSIS — N281 Cyst of kidney, acquired: Secondary | ICD-10-CM | POA: Diagnosis not present

## 2016-05-06 DIAGNOSIS — K7469 Other cirrhosis of liver: Secondary | ICD-10-CM | POA: Diagnosis not present

## 2016-05-06 DIAGNOSIS — K8020 Calculus of gallbladder without cholecystitis without obstruction: Secondary | ICD-10-CM | POA: Diagnosis not present

## 2016-05-07 DIAGNOSIS — K7460 Unspecified cirrhosis of liver: Secondary | ICD-10-CM | POA: Diagnosis not present

## 2016-05-10 ENCOUNTER — Other Ambulatory Visit (INDEPENDENT_AMBULATORY_CARE_PROVIDER_SITE_OTHER): Payer: Self-pay | Admitting: *Deleted

## 2016-05-10 DIAGNOSIS — K7469 Other cirrhosis of liver: Secondary | ICD-10-CM

## 2016-05-10 DIAGNOSIS — K769 Liver disease, unspecified: Secondary | ICD-10-CM

## 2016-05-10 LAB — AFP TUMOR MARKER: AFP TUMOR MARKER: 8.7 ng/mL — AB (ref <6.1)

## 2016-05-14 ENCOUNTER — Ambulatory Visit (HOSPITAL_COMMUNITY)
Admission: RE | Admit: 2016-05-14 | Discharge: 2016-05-14 | Disposition: A | Discharge status: Home / Self Care | Payer: Medicare Other | Source: Ambulatory Visit | Attending: Internal Medicine | Admitting: Internal Medicine

## 2016-05-14 DIAGNOSIS — K7469 Other cirrhosis of liver: Secondary | ICD-10-CM | POA: Insufficient documentation

## 2016-05-14 DIAGNOSIS — K8020 Calculus of gallbladder without cholecystitis without obstruction: Secondary | ICD-10-CM | POA: Insufficient documentation

## 2016-05-14 DIAGNOSIS — K769 Liver disease, unspecified: Secondary | ICD-10-CM | POA: Diagnosis not present

## 2016-05-14 DIAGNOSIS — K7460 Unspecified cirrhosis of liver: Secondary | ICD-10-CM | POA: Diagnosis not present

## 2016-05-14 DIAGNOSIS — K862 Cyst of pancreas: Secondary | ICD-10-CM | POA: Diagnosis not present

## 2016-05-14 LAB — POCT I-STAT CREATININE: Creatinine, Ser: 1.1 mg/dL (ref 0.61–1.24)

## 2016-05-14 MED ORDER — GADOXETATE DISODIUM 0.25 MMOL/ML IV SOLN
10.0000 mL | Freq: Once | INTRAVENOUS | Status: AC | PRN
  Administered 2016-05-14: 10 mL via INTRAVENOUS

## 2016-05-31 ENCOUNTER — Encounter (INDEPENDENT_AMBULATORY_CARE_PROVIDER_SITE_OTHER): Payer: Self-pay | Admitting: Internal Medicine

## 2016-05-31 NOTE — Progress Notes (Signed)
Patient was given an appointment for 09/28/16 at 11:00am with Dr. Laural Golden.  A letter was mailed to the patient.

## 2016-07-05 DIAGNOSIS — Z6835 Body mass index (BMI) 35.0-35.9, adult: Secondary | ICD-10-CM | POA: Diagnosis not present

## 2016-07-05 DIAGNOSIS — R011 Cardiac murmur, unspecified: Secondary | ICD-10-CM | POA: Diagnosis not present

## 2016-07-05 DIAGNOSIS — E782 Mixed hyperlipidemia: Secondary | ICD-10-CM | POA: Diagnosis not present

## 2016-07-05 DIAGNOSIS — E119 Type 2 diabetes mellitus without complications: Secondary | ICD-10-CM | POA: Diagnosis not present

## 2016-07-05 DIAGNOSIS — E6609 Other obesity due to excess calories: Secondary | ICD-10-CM | POA: Diagnosis not present

## 2016-07-05 DIAGNOSIS — I10 Essential (primary) hypertension: Secondary | ICD-10-CM | POA: Diagnosis not present

## 2016-07-07 ENCOUNTER — Other Ambulatory Visit (HOSPITAL_COMMUNITY): Payer: Self-pay | Admitting: Family Medicine

## 2016-07-07 DIAGNOSIS — R0989 Other specified symptoms and signs involving the circulatory and respiratory systems: Secondary | ICD-10-CM

## 2016-07-13 ENCOUNTER — Ambulatory Visit (HOSPITAL_COMMUNITY)
Admission: RE | Admit: 2016-07-13 | Discharge: 2016-07-13 | Disposition: A | Discharge status: Home / Self Care | Payer: Medicare Other | Source: Ambulatory Visit | Attending: Family Medicine | Admitting: Family Medicine

## 2016-07-13 DIAGNOSIS — E119 Type 2 diabetes mellitus without complications: Secondary | ICD-10-CM | POA: Diagnosis not present

## 2016-07-13 DIAGNOSIS — R0989 Other specified symptoms and signs involving the circulatory and respiratory systems: Secondary | ICD-10-CM | POA: Diagnosis not present

## 2016-07-13 DIAGNOSIS — Z9849 Cataract extraction status, unspecified eye: Secondary | ICD-10-CM | POA: Diagnosis not present

## 2016-07-13 DIAGNOSIS — I6523 Occlusion and stenosis of bilateral carotid arteries: Secondary | ICD-10-CM | POA: Diagnosis not present

## 2016-07-13 DIAGNOSIS — Z961 Presence of intraocular lens: Secondary | ICD-10-CM | POA: Diagnosis not present

## 2016-09-09 ENCOUNTER — Other Ambulatory Visit (HOSPITAL_COMMUNITY): Payer: Self-pay | Admitting: Orthopedic Surgery

## 2016-09-09 DIAGNOSIS — M25561 Pain in right knee: Secondary | ICD-10-CM | POA: Diagnosis not present

## 2016-09-09 DIAGNOSIS — M25562 Pain in left knee: Secondary | ICD-10-CM | POA: Diagnosis not present

## 2016-09-15 DIAGNOSIS — M25561 Pain in right knee: Secondary | ICD-10-CM | POA: Diagnosis not present

## 2016-09-16 ENCOUNTER — Ambulatory Visit (HOSPITAL_COMMUNITY): Payer: Medicare Other

## 2016-09-16 ENCOUNTER — Encounter (HOSPITAL_COMMUNITY): Payer: Self-pay

## 2016-09-20 DIAGNOSIS — M25561 Pain in right knee: Secondary | ICD-10-CM | POA: Diagnosis not present

## 2016-09-28 ENCOUNTER — Ambulatory Visit (INDEPENDENT_AMBULATORY_CARE_PROVIDER_SITE_OTHER): Payer: Medicare Other | Admitting: Internal Medicine

## 2016-09-29 DIAGNOSIS — G8918 Other acute postprocedural pain: Secondary | ICD-10-CM | POA: Diagnosis not present

## 2016-09-29 DIAGNOSIS — M1711 Unilateral primary osteoarthritis, right knee: Secondary | ICD-10-CM | POA: Diagnosis not present

## 2016-09-29 DIAGNOSIS — S83241A Other tear of medial meniscus, current injury, right knee, initial encounter: Secondary | ICD-10-CM | POA: Diagnosis not present

## 2016-09-29 DIAGNOSIS — Y999 Unspecified external cause status: Secondary | ICD-10-CM | POA: Diagnosis not present

## 2016-10-04 ENCOUNTER — Encounter (HOSPITAL_COMMUNITY): Payer: Self-pay

## 2016-10-04 ENCOUNTER — Ambulatory Visit (HOSPITAL_COMMUNITY): Payer: Medicare Other | Attending: Orthopedic Surgery

## 2016-10-04 DIAGNOSIS — M6281 Muscle weakness (generalized): Secondary | ICD-10-CM | POA: Diagnosis not present

## 2016-10-04 DIAGNOSIS — R29898 Other symptoms and signs involving the musculoskeletal system: Secondary | ICD-10-CM | POA: Insufficient documentation

## 2016-10-04 DIAGNOSIS — R262 Difficulty in walking, not elsewhere classified: Secondary | ICD-10-CM | POA: Diagnosis not present

## 2016-10-04 NOTE — Therapy (Signed)
[place], [place], [address] Phone: [phone]   Fax:  [phone]  Physical Therapy Evaluation  Patient Details  Name: Cody Austin MRN: [id number] Date of Birth: 12/12/1950 Referring Provider: Flossie Dibble, MD  Encounter Date: 10/04/2016      PT End of Session - 10/04/16 1221    Visit Number 1   Number of Visits 9   Date for PT Re-Evaluation 11/01/16   Authorization Type Medicare Part A and B (Secondary: BCBS Supplement)   Authorization Time Period 10/04/16 to 11/01/16   Authorization - Visit Number 1   Authorization - Number of Visits 10   PT Start Time 1540   PT Stop Time 1119   PT Time Calculation (min) 43 min   Activity Tolerance Patient tolerated treatment well;No increased pain   Behavior During Therapy WFL for tasks assessed/performed      Past Medical History:  Diagnosis Date  . Arthritis   . Chronic kidney disease    left renal stone  . Diabetes mellitus without complication ([place])    Pre-Diabetes per MD  . Fatty liver   . Heart murmur   . Other pancytopenia ([place]) 11/19/2014  . Sleep apnea    uses CPAP    Past Surgical History:  Procedure Laterality Date  . CATARACT EXTRACTION W/PHACO Right 08/12/2014   Procedure: CATARACT EXTRACTION PHACO AND INTRAOCULAR LENS PLACEMENT (IOC);  Surgeon: [name], MD;  Location: [facility];  Service: Ophthalmology;  Laterality: Right;  CDE 9.32  . CATARACT EXTRACTION W/PHACO Left 08/26/2014   Procedure: CATARACT EXTRACTION PHACO AND INTRAOCULAR LENS PLACEMENT (IOC);  Surgeon: [name], MD;  Location: [facility];  Service: Ophthalmology;  Laterality: Left;  CDE:  9.49  . COLONOSCOPY N/A 12/13/2014   Procedure: COLONOSCOPY;  Surgeon: Rogene Houston, MD;  Location: [facility];  Service: Endoscopy;  Laterality: N/A;  100  . ESOPHAGOGASTRODUODENOSCOPY N/A 12/13/2014   Procedure: ESOPHAGOGASTRODUODENOSCOPY (EGD);  Surgeon: Rogene Houston, MD;  Location: [facility];   Service: Endoscopy;  Laterality: N/A;  . HERNIA REPAIR Right    Inguinal hernia    There were no vitals filed for this visit.       Subjective Assessment - 10/04/16 1040    Subjective Pt states that he had a R knee scope on 09/29/16 for a torn medial meniscus. Pt states that he is unsure of how he tore his meniscus originally, but he thinks it was after he moved a lot of furniture back in late 2017 and his knee progressively worsened.  He is currently using a single axillary crutch but he is unsure of when he can stop using it. He denies any knee pain since the surgery. He is having the most difficulty with walking.    Limitations Walking;House hold activities   How long can you sit comfortably? no issues   How long can you stand comfortably? hasn't tried   How long can you walk comfortably? hasn't had to walk far enough that he would need to sit; he is only walking to and from the bathroom   Patient Stated Goals get off the crutch, get back to being able to use his knee   Currently in Pain? No/denies            [facility] PT Assessment - 10/04/16 0001      Assessment   Medical Diagnosis s/p Rt knee scope medial menisectomy   Referring Provider Flossie Dibble, MD  Onset Date/Surgical Date 09/29/16   Next MD Visit 10/06/16   Prior Therapy none for present issue     Balance Screen   Has the patient fallen in the past 6 months No   Has the patient had a decrease in activity level because of a fear of falling?  No   Is the patient reluctant to leave their home because of a fear of falling?  No     Prior Function   Level of Independence Independent   Vocation Retired   Astronomer, was doing some treadmill     Observation/Other Assessments   Observations pt noted to be ambulating with unilateral axillary crutch on the R side; cued pt to switch arms and explainied that it allows for more proper gait pattern when using it on the L side   Skin Integrity pt's 2 incisions are  continuing to heal nicely. each still has stitches and both are not red, swollen, or warm to touch     Observation/Other Assessments-Edema    Edema Circumferential     Circumferential Edema   Circumferential - Right 15"   Circumferential - Left  14.75"     ROM / Strength   AROM / PROM / Strength AROM;Strength     AROM   AROM Assessment Site Knee   Right/Left Knee Right   Right Knee Extension 0   Right Knee Flexion 126     Strength   Strength Assessment Site Hip;Knee;Ankle   Right Hip Flexion 5/5   Right Hip Extension 4/5   Right Hip ABduction 4+/5   Left Hip Flexion 5/5   Left Hip Extension 4+/5   Left Hip ABduction 4+/5   Right Knee Flexion 5/5   Right Knee Extension 4+/5   Left Knee Flexion 5/5   Left Knee Extension 5/5   Right Ankle Dorsiflexion 5/5   Left Ankle Dorsiflexion 5/5     Flexibility   Soft Tissue Assessment /Muscle Length yes   Hamstrings tight BLE     Palpation   Patella mobility WNL   Palpation comment pt was not tender to palpation of distal quads or HS, only mild restrictions noted.     Ambulation/Gait   Ambulation/Gait Yes   Ambulation Distance (Feet) 330 Feet  3MWT   Assistive device L Axillary Crutch   Gait Pattern Decreased arm swing - right;Decreased arm swing - left;Decreased step length - left;Decreased stance time - right;Decreased stride length;Decreased hip/knee flexion - right;Decreased dorsiflexion - right;Decreased weight shift to right  R foot ER, decr push-off on R     Balance   Balance Assessed Yes     Static Standing Balance   Static Standing - Balance Support No upper extremity supported   Static Standing Balance -  Activities  Single Leg Stance - Right Leg;Single Leg Stance - Left Leg   Static Standing - Comment/# of Minutes L: 32 sec, R: 9 sec     Standardized Balance Assessment   Standardized Balance Assessment Five Times Sit to Stand;Timed Up and Go Test   Five times sit to stand comments  13.71 sec from chair with no  UE support     Timed Up and Go Test   Normal TUG (seconds) 21.5  uni axillary crutch        Objective measurements completed on examination: See above findings.         PT Education - 10/04/16 1221    Education provided Yes   Education Details exam findings, POC, HEP,  proper use of single axillary crutch   Person(s) Educated Patient   Methods Explanation;Demonstration;Handout   Comprehension Verbalized understanding;Returned demonstration          PT Short Term Goals - 10/04/16 1356      PT SHORT TERM GOAL #1   Title Pt will be independent with HEP and perform consistently in order to maximize return to PLOF.   Time 2   Period Weeks   Status New     PT SHORT TERM GOAL #2   Title Pt will have improved 5xSTS to 10 sec or < from chair with no UE support to demonstrate improved overall BLE strength and power.   Time 2   Period Weeks   Status New     PT SHORT TERM GOAL #3   Title Pt will have improved BLE strength to 5/5 throughout in order to maximize gait and and return to PLOF.   Time 2   Period Weeks   Status New           PT Long Term Goals - 10/04/16 1400      PT LONG TERM GOAL #1   Title Pt will have improved SLS on RLE to at least 30 sec to demonstrate improved balance and maximize gait on uneven ground.   Time 4   Period Weeks   Status New     PT LONG TERM GOAL #2   Title Pt will have improved TUG with LRAD to 12 seconds or < to demonstrate improved balance, gait, and overall function in order to maximize function in the community.   Time 4   Period Weeks   Status New     PT LONG TERM GOAL #3   Title Pt will have improved 3MWT to at least 647f with LRAD and with proper gait mechanics in order to demonstrate improved overall gait, balance, and function in order to maximize pt's return to his regular walking program.   Time 4   Period Weeks   Status New                Plan - 10/04/16 1222    Clinical Impression Statement Pt is 66 YO M who presents to OPPT s/p R knee scope for medial menisectomy. Pt's AROM and swelling of the R knee are WNL and pain-free. Pt has deficits in MMT, balance, gait, and overall function as illustrated above. Pt currently ambulating with unilateral axillary crutch but he is unsure of when he can stop using it. He has an appointment with his doctor on 10/06/16 so PT told pt to continue to use it until he talks to him. Pt would benefit from skilled PT intervention to address his deficits in order to maximize his overall funciton and WOL.   History and Personal Factors relevant to plan of care: no h/o knee surgery in the past, relatively young and healthy; motivated   Clinical Presentation Stable   Clinical Presentation due to: decreased MMT, balance, gait, and function.   Clinical Decision Making Low   Rehab Potential Good   PT Frequency 2x / week   PT Duration 4 weeks   PT Treatment/Interventions ADLs/Self Care Home Management;Cryotherapy;DME Instruction;Gait training;Stair training;Functional mobility training;Therapeutic activities;Therapeutic exercise;Balance training;Neuromuscular re-education;Patient/family education;Manual techniques;Passive range of motion;Dry needling   PT Next Visit Plan review goals, assess WB status after MD f/u visit, HS stretch calf stretch, SAQ, SLR, bridging, sidelying hip abd, LAQ, sit <> stands, side stepping, fwd/lat step ups, mini squats, gait training  PT Home Exercise Plan 6/18: LAQ, sit <> stands   Consulted and Agree with Plan of Care Patient      Patient will benefit from skilled therapeutic intervention in order to improve the following deficits and impairments:  Abnormal gait, Decreased balance, Decreased endurance, Decreased strength, Difficulty walking, Impaired flexibility, Decreased knowledge of use of DME  Visit Diagnosis: Difficulty in walking, not elsewhere classified - Plan: PT plan of care cert/re-cert  Muscle weakness (generalized) - Plan: PT  plan of care cert/re-cert  Other symptoms and signs involving the musculoskeletal system - Plan: PT plan of care cert/re-cert      G-Codes - 73/71/06 1407    Functional Assessment Tool Used (Outpatient Only) clinical judgement, 5xSTS, SLS, MMT, TUG, 3MWT   Functional Limitation Mobility: Walking and moving around   Mobility: Walking and Moving Around Current Status (Y6948) At least 40 percent but less than 60 percent impaired, limited or restricted   Mobility: Walking and Moving Around Goal Status (N4627) At least 1 percent but less than 20 percent impaired, limited or restricted       Problem List Patient Active Problem List   Diagnosis Date Noted  . Pre-diabetes 11/19/2014  . Obesity 11/19/2014  . Hepatic cirrhosis ([place]) 11/19/2014  . Cirrhosis, nonalcoholic ([place]) 03/50/0938  . Other pancytopenia ([place]) 11/19/2014     Geraldine Solar PT, [place] [address] [place], [place], [address] Phone: [phone]   Fax:  [phone]  Name: Cody Austin MRN: [id number] Date of Birth: 03-Apr-1951

## 2016-10-04 NOTE — Patient Instructions (Signed)
  LONG ARC QUAD - LAQ - HIGH SEAT  While seated with your knee in a bent position, slowly straighten your knee as you raise your foot upwards as shown.   Perform 1x/day, 2-3 sets of 10 reps holding for 3-5 seconds   SIT TO STAND - NO SUPPORT  Start by scooting close to the front of the chair.  Next, lean forward at your trunk and reach forward with your arms and rise to standing without using your hands to push off from the chair or other object.   Use your arms as a counter-balance by reaching forward when in sitting and lower them as you approach standing.  Perform 1x/day, 2-3 sets of 10 reps, making sure to be controlled on the way down.

## 2016-10-05 ENCOUNTER — Encounter (INDEPENDENT_AMBULATORY_CARE_PROVIDER_SITE_OTHER): Payer: Self-pay | Admitting: Internal Medicine

## 2016-10-05 ENCOUNTER — Ambulatory Visit (INDEPENDENT_AMBULATORY_CARE_PROVIDER_SITE_OTHER): Payer: Medicare Other | Admitting: Internal Medicine

## 2016-10-05 VITALS — BP 130/74 | HR 66 | Temp 98.5°F | Resp 18 | Ht 67.0 in | Wt 227.3 lb

## 2016-10-05 DIAGNOSIS — K7460 Unspecified cirrhosis of liver: Secondary | ICD-10-CM

## 2016-10-05 NOTE — Patient Instructions (Addendum)
Physician will call with results of blood tests when completed. Resume physical activity as before as soon as cleared by your orthopedic surgeon Liver ultrasound in December 2018.

## 2016-10-05 NOTE — Progress Notes (Signed)
Presenting complaint;  Follow-up for cirrhosis.  Subjective:  Cody Austin is 66 old Caucasian male who was cirrhosis secondary to NASH is here for yearly was it. He was last seen one year ago. He has no complaints. His physical activities on hold because of right knee problems. He just said arthroscopy. He will resume physical activity as soon as he is cleared by his orthopedic surgeon. He has good appetite. He denies nausea vomiting abdominal pain melena or frank rectal bleeding. He has hemorrhoids and every now and then notices blood on the tissue. Hemoglobin A1c recently was 6.1. Has lost 3 pounds since his last visit    Current Medications: Outpatient Encounter Prescriptions as of 10/05/2016  Medication Sig  . fenofibrate (TRICOR) 145 MG tablet Take 160 mg by mouth at bedtime.   [place] HYDROcodone-acetaminophen (NORCO/VICODIN) 5-325 MG tablet Take 1 tablet by mouth as needed.   . metFORMIN (GLUCOPHAGE) 500 MG tablet Take 500 mg by mouth at bedtime.   . simvastatin (ZOCOR) 40 MG tablet Take 40 mg by mouth at bedtime.   No facility-administered encounter medications on file as of 10/05/2016.      Objective: Blood pressure 130/74, pulse 66, temperature 98.5 F (36.9 C), temperature source Oral, resp. rate 18, height 5' 7"  (1.702 m), weight 227 lb 4.8 oz (103.1 kg). Patient is alert and in no acute distress. Conjunctiva is pink. Sclera is nonicteric Oropharyngeal mucosa is normal. No neck masses or thyromegaly noted. Cardiac exam with regular rhythm normal S1 and S2. He has grade 2/6 systolic ejection murmur best heard at left sternal border. Lungs are clear to auscultation. Abdomen is full. It is soft and nontender without organomegaly or masses.  No LE edema or clubbing noted.  Labs/studies Results: Lab data from 05/07/2016  AFP 8.7on 05/07/2016. AFP was 8.2 in August 2017  Ultrasound on 05/06/2016 revealed single small isoechoic lesion in anterior segment of right lobe of the liver measuring  0.9 x 0.8 x 80.81 cm. 66 year old she showed cholelithiasis and splenomegaly.  MRI revealed this lesion to be hepatic cysts.  EGD on 12/13/2014 revealed mild portal gastropathy erosive gastroduodenitis but no evidence of esophageal gastric varices.  H.Pylori serology was negative.   Assessment:  #1. Compensated cirrhosis secondary to NASH. He had ultrasound in January 2018 revealing small hepatic lesion which turned out to be a cyst on MR. He remains with borderline alpha-fetoprotein. Last EGD was in August 2016 and was negative for esophageal or gastric varices. Since his condition appears to be stable will hold off EGD for now.  #2. Thrombocytopenia secondary to chronic liver disease.  #3. Asymptomatic cholelithiasis.  Plan:  Patient will resume physical activity as soon as he is cleared by his orthopedic surgeon. He will have CBC LFTs and AFP next week with rest of his blood work. Right upper quadrant ultrasound for [facility] screening in December 2017. Office visit in one year.

## 2016-10-06 DIAGNOSIS — S83241D Other tear of medial meniscus, current injury, right knee, subsequent encounter: Secondary | ICD-10-CM | POA: Diagnosis not present

## 2016-10-07 ENCOUNTER — Encounter (HOSPITAL_COMMUNITY): Payer: Self-pay

## 2016-10-07 ENCOUNTER — Ambulatory Visit (HOSPITAL_COMMUNITY): Payer: Medicare Other

## 2016-10-07 DIAGNOSIS — R262 Difficulty in walking, not elsewhere classified: Secondary | ICD-10-CM | POA: Diagnosis not present

## 2016-10-07 DIAGNOSIS — M6281 Muscle weakness (generalized): Secondary | ICD-10-CM | POA: Diagnosis not present

## 2016-10-07 DIAGNOSIS — R29898 Other symptoms and signs involving the musculoskeletal system: Secondary | ICD-10-CM | POA: Diagnosis not present

## 2016-10-07 NOTE — Patient Instructions (Addendum)
  STRAIGHT LEG RAISE - SLR  While lying on your back, raise up your leg with a straight knee.  Keep the opposite knee bent with the foot planted on the ground.  Perform 1x/day, 2-3 sets of 10 reps   HIP ABDUCTION - SIDELYING  While lying on your side, slowly raise up your top leg to the side. Keep your knee straight and maintain your toes pointed forward the entire time. Keep your leg in-line with your body.  The bottom leg can be bent to stabilize your body.  Perform 1x/day, 2-3 sets of 10 reps   Mini Squat at Counter  Standing with feet shoulder length apart, bend knees slightly and return to standing position. Don't let knees go over toes and keep hands lightly on counter top for support. Perform with chair behind you for form -- act like you're going to sit but only go half way down, then stand back up.  Perform 1x/day, 2-3 sets of 10 reps

## 2016-10-07 NOTE — Therapy (Signed)
[place], [place], [address] Phone: [phone]   Fax:  [phone]  Physical Therapy Treatment  Patient Details  Name: Cody Austin MRN: [id number] Date of Birth: 1950/10/25 Referring Provider: Flossie Dibble, MD  Encounter Date: 10/07/2016      PT End of Session - 10/07/16 1346    Visit Number 2   Number of Visits 9   Date for PT Re-Evaluation 11/01/16   Authorization Type Medicare Part A and B (Secondary: BCBS Supplement)   Authorization Time Period 10/04/16 to 11/01/16   Authorization - Visit Number 2   Authorization - Number of Visits 10   PT Start Time 7341   PT Stop Time 1425   PT Time Calculation (min) 40 min   Activity Tolerance Patient tolerated treatment well;No increased pain   Behavior During Therapy WFL for tasks assessed/performed      Past Medical History:  Diagnosis Date  . Arthritis   . Chronic kidney disease    left renal stone  . Diabetes mellitus without complication ([place])    Pre-Diabetes per MD  . Fatty liver   . Heart murmur   . Other pancytopenia ([place]) 11/19/2014  . Sleep apnea    uses CPAP    Past Surgical History:  Procedure Laterality Date  . CATARACT EXTRACTION W/PHACO Right 08/12/2014   Procedure: CATARACT EXTRACTION PHACO AND INTRAOCULAR LENS PLACEMENT (IOC);  Surgeon: [name], MD;  Location: [facility];  Service: Ophthalmology;  Laterality: Right;  CDE 9.32  . CATARACT EXTRACTION W/PHACO Left 08/26/2014   Procedure: CATARACT EXTRACTION PHACO AND INTRAOCULAR LENS PLACEMENT (IOC);  Surgeon: [name], MD;  Location: [facility];  Service: Ophthalmology;  Laterality: Left;  CDE:  9.49  . COLONOSCOPY N/A 12/13/2014   Procedure: COLONOSCOPY;  Surgeon: Rogene Houston, MD;  Location: [facility];  Service: Endoscopy;  Laterality: N/A;  100  . ESOPHAGOGASTRODUODENOSCOPY N/A 12/13/2014   Procedure: ESOPHAGOGASTRODUODENOSCOPY (EGD);  Surgeon: Rogene Houston, MD;  [facility]: [facility];   Service: Endoscopy;  Laterality: N/A;  . HERNIA REPAIR Right    Inguinal hernia    There were no vitals filed for this visit.      Subjective Assessment - 10/07/16 1346    Subjective Pt saw the PA yesterday for follow up with his surgery. He cleared the pt for driving and he said that he was WBAT and could get rid of the crutch for walking. He could tell last night and this morning that he had done more on the leg.    Limitations Walking;House hold activities   How long can you sit comfortably? no issues   How long can you stand comfortably? hasn't tried   How long can you walk comfortably? hasn't had to walk far enough that he would need to sit; he is only walking to and from the bathroom   Patient Stated Goals get off the crutch, get back to being able to use his knee   Currently in Pain? No/denies                         [facility] Adult PT Treatment/Exercise - 10/07/16 0001      Exercises   Exercises Knee/Hip     Knee/Hip Exercises: Stretches   Active Hamstring Stretch Right;3 reps;30 seconds     Knee/Hip Exercises: Standing   Forward Step Up Step Height: 6";Hand Hold: 0   Forward Step Up Limitations x7  reps, pt really fatigued by EOS and could only complete 7 reps, slight increased knee pain with step ups   Functional Squat 15 reps  chair behind for form     Knee/Hip Exercises: Seated   Long Arc Quad Strengthening;Right;15 reps   Long Arc Quad Weight 2 lbs.   Sit to Sand 15 reps;without UE support  RTB around knees     Knee/Hip Exercises: Supine   Short Arc Quad Sets Right;2 sets;15 reps   Short Arc Quad Sets Limitations 2#, 2-3 sec holds   [employer] Limitations RTB around knees, last few reps with ankle DF to improve posterior chain engagement   Straight Leg Raises Right;2 sets;15 reps     Knee/Hip Exercises: Sidelying   Hip ABduction Both;15 reps                PT Education - 10/07/16 1429    Education provided Yes    Education Details reviewed goals, updated HEP to include more strengthening   Person(s) Educated Patient   Methods Explanation;Demonstration;Handout   Comprehension Verbalized understanding;Returned demonstration          PT Short Term Goals - 10/04/16 1356      PT SHORT TERM GOAL #1   Title Pt will be independent with HEP and perform consistently in order to maximize return to PLOF.   Time 2   Period Weeks   Status New     PT SHORT TERM GOAL #2   Title Pt will have improved 5xSTS to 10 sec or < from chair with no UE support to demonstrate improved overall BLE strength and power.   Time 2   Period Weeks   Status New     PT SHORT TERM GOAL #3   Title Pt will have improved BLE strength to 5/5 throughout in order to maximize gait and and return to PLOF.   Time 2   Period Weeks   Status New           PT Long Term Goals - 10/04/16 1400      PT LONG TERM GOAL #1   Title Pt will have improved SLS on RLE to at least 30 sec to demonstrate improved balance and maximize gait on uneven ground.   Time 4   Period Weeks   Status New     PT LONG TERM GOAL #2   Title Pt will have improved TUG with LRAD to 12 seconds or < to demonstrate improved balance, gait, and overall function in order to maximize function in the community.   Time 4   Period Weeks   Status New     PT LONG TERM GOAL #3   Title Pt will have improved 3MWT to at least 641f with LRAD and with proper gait mechanics in order to demonstrate improved overall gait, balance, and function in order to maximize pt's return to his regular walking program.   Time 4   Period Weeks   Status New               Plan - 10/07/16 1430    Clinical Impression Statement Pt presented to therapy without his crutch and was told by his PA-C that he could be WBAT. Reviewed goals and issued pt copy of eval with no f/u questions. Pt did very well with therex this date, only requiring minimal cues for proper form. he did however have  increaed fatigue by EOS. PT educated pt that he may have  increased muscle soreness following today's session and he verbalized understanding. Continue POC as planned.   Rehab Potential Good   PT Frequency 2x / week   PT Duration 4 weeks   PT Treatment/Interventions ADLs/Self Care Home Management;Cryotherapy;DME Instruction;Gait training;Stair training;Functional mobility training;Therapeutic activities;Therapeutic exercise;Balance training;Neuromuscular re-education;Patient/family education;Manual techniques;Passive range of motion;Dry needling   PT Next Visit Plan HS stretch calf stretch, SAQ 3#, SLR, trial SL bridging, sidelying hip abd, sit <> stands with eccentric lowering, start side stepping, continue fwd step ups but go to 4" step; trial lateral step ups, mini squats, gait training   PT Home Exercise Plan 6/18: LAQ, sit <> stands; 6/21: SLR, sidelying SLR, mini squats   Consulted and Agree with Plan of Care Patient      Patient will benefit from skilled therapeutic intervention in order to improve the following deficits and impairments:  Abnormal gait, Decreased balance, Decreased endurance, Decreased strength, Difficulty walking, Impaired flexibility, Decreased knowledge of use of DME  Visit Diagnosis: Difficulty in walking, not elsewhere classified  Muscle weakness (generalized)  Other symptoms and signs involving the musculoskeletal system     Problem List Patient Active Problem List   Diagnosis Date Noted  . Pre-diabetes 11/19/2014  . Obesity 11/19/2014  . Hepatic cirrhosis ([place]) 11/19/2014  . Cirrhosis, nonalcoholic ([place]) 53/79/4327  . Other pancytopenia ([place]) 11/19/2014     Geraldine Solar PT, [place] [address] [place], [place], [address] Phone: [phone]   Fax:  [phone]  Name: Cody Austin MRN: [id number] Date of Birth: [birth year]

## 2016-10-11 ENCOUNTER — Ambulatory Visit (HOSPITAL_COMMUNITY): Payer: Medicare Other

## 2016-10-11 ENCOUNTER — Encounter (HOSPITAL_COMMUNITY): Payer: Self-pay

## 2016-10-11 DIAGNOSIS — M6281 Muscle weakness (generalized): Secondary | ICD-10-CM

## 2016-10-11 DIAGNOSIS — R262 Difficulty in walking, not elsewhere classified: Secondary | ICD-10-CM

## 2016-10-11 DIAGNOSIS — R29898 Other symptoms and signs involving the musculoskeletal system: Secondary | ICD-10-CM

## 2016-10-11 NOTE — Therapy (Signed)
[place], [place], [address] Phone: [phone]   Fax:  [phone]  Physical Therapy Treatment  Patient Details  Name: Cody Austin MRN: [id number] Date of Birth: 1950-06-30 Referring Provider: Flossie Dibble, MD  Encounter Date: 10/11/2016      PT End of Session - 10/11/16 1443    Visit Number 3   Number of Visits 9   Date for PT Re-Evaluation 11/01/16   Authorization Type Medicare Part A and B (Secondary: BCBS Supplement)   Authorization Time Period 10/04/16 to 11/01/16   Authorization - Visit Number 3   Authorization - Number of Visits 10   PT Start Time 7622   PT Stop Time 1429   PT Time Calculation (min) 41 min   Activity Tolerance Patient tolerated treatment well;No increased pain   Behavior During Therapy WFL for tasks assessed/performed      Past Medical History:  Diagnosis Date  . Arthritis   . Chronic kidney disease    left renal stone  . Diabetes mellitus without complication ([place])    Pre-Diabetes per MD  . Fatty liver   . Heart murmur   . Other pancytopenia ([place]) 11/19/2014  . Sleep apnea    uses CPAP    Past Surgical History:  Procedure Laterality Date  . CATARACT EXTRACTION W/PHACO Right 08/12/2014   Procedure: CATARACT EXTRACTION PHACO AND INTRAOCULAR LENS PLACEMENT (IOC);  Surgeon: [name], MD;  Location: [facility];  Service: Ophthalmology;  Laterality: Right;  CDE 9.32  . CATARACT EXTRACTION W/PHACO Left 08/26/2014   Procedure: CATARACT EXTRACTION PHACO AND INTRAOCULAR LENS PLACEMENT (IOC);  Surgeon: [name], MD;  Location: [facility];  Service: Ophthalmology;  Laterality: Left;  CDE:  9.49  . COLONOSCOPY N/A 12/13/2014   Procedure: COLONOSCOPY;  Surgeon: Rogene Houston, MD;  [facility];  Service: Endoscopy;  Laterality: N/A;  100  . ESOPHAGOGASTRODUODENOSCOPY N/A 12/13/2014   Procedure: ESOPHAGOGASTRODUODENOSCOPY (EGD);  Surgeon: Rogene Houston, MD;  [facility];   Service: Endoscopy;  Laterality: N/A;  . HERNIA REPAIR Right    Inguinal hernia    There were no vitals filed for this visit.      Subjective Assessment - 10/11/16 1349    Subjective Pt states that he was a little sore after last session but not bad. He reports compliance with HEP. He states he's done a lot this mroning so his pain is a 1-2/10.   Limitations Walking;House hold activities   How long can you sit comfortably? no issues   How long can you stand comfortably? hasn't tried   How long can you walk comfortably? hasn't had to walk far enough that he would need to sit; he is only walking to and from the bathroom   Patient Stated Goals get off the crutch, get back to being able to use his knee   Currently in Pain? Yes   Pain Score 2    Pain Location Knee   Pain Orientation Right   Pain Descriptors / Indicators --  "not really hurting, I just know it's there"   Pain Type Surgical pain   Pain Onset 1 to 4 weeks ago   Pain Frequency Intermittent   Aggravating Factors  walking on uneven ground   Pain Relieving Factors rest, ice   Effect of Pain on Daily Activities increases             [facility] Adult PT Treatment/Exercise - 10/11/16 0001  Knee/Hip Exercises: Stretches   Active Hamstring Stretch Right;3 reps;30 seconds   Active Hamstring Stretch Limitations supine with rope   Gastroc Stretch Both;3 reps;30 seconds   Gastroc Stretch Limitations slant board     Knee/Hip Exercises: Standing   Lateral Step Up Right;10 reps;Hand Hold: 0;Step Height: 4"   Lateral Step Up Limitations some knee pain reported with ascent   Forward Step Up Right;10 reps;Step Height: 4";Hand Hold: 0   Forward Step Up Limitations much improved from last session but pt still unsteady throughout and reported some anterior knee pain with ascent   Rocker Board 3 minutes   Rocker Board Limitations R/L   SLS 5x10" each on foam   Gait Training x2 laps around gym; cues to decreased foot ER and  increase arm swing, trendelenberg and antalgic on R   Other Standing Knee Exercises side stepping 51f with RTB x 2RT     Knee/Hip Exercises: Seated   Sit to Sand 10 reps;without UE support  5 sec eccentric lowering     Knee/Hip Exercises: Supine   Short Arc Quad Sets Right;2 sets;15 reps   Short Arc Quad Sets Limitations 3#, 2-3 sec hold at top   Single Leg Bridge Both;2 sets;5 reps   Straight Leg Raises Right;1 set;15 reps                PT Education - 10/11/16 1442    Education provided Yes   Education Details proper walking pattern, continue HEP   Person(s) Educated Patient   Methods Explanation;Demonstration   Comprehension Verbalized understanding;Returned demonstration          PT Short Term Goals - 10/04/16 1356      PT SHORT TERM GOAL #1   Title Pt will be independent with HEP and perform consistently in order to maximize return to PLOF.   Time 2   Period Weeks   Status New     PT SHORT TERM GOAL #2   Title Pt will have improved 5xSTS to 10 sec or < from chair with no UE support to demonstrate improved overall BLE strength and power.   Time 2   Period Weeks   Status New     PT SHORT TERM GOAL #3   Title Pt will have improved BLE strength to 5/5 throughout in order to maximize gait and and return to PLOF.   Time 2   Period Weeks   Status New           PT Long Term Goals - 10/04/16 1400      PT LONG TERM GOAL #1   Title Pt will have improved SLS on RLE to at least 30 sec to demonstrate improved balance and maximize gait on uneven ground.   Time 4   Period Weeks   Status New     PT LONG TERM GOAL #2   Title Pt will have improved TUG with LRAD to 12 seconds or < to demonstrate improved balance, gait, and overall function in order to maximize function in the community.   Time 4   Period Weeks   Status New     PT LONG TERM GOAL #3   Title Pt will have improved 3MWT to at least 6081fwith LRAD and with proper gait mechanics in order to  demonstrate improved overall gait, balance, and function in order to maximize pt's return to his regular walking program.   Time 4   Period Weeks   Status New  Plan - 10/11/16 1443    Clinical Impression Statement Pt did well with progressed therex this date. He was able to perform fwd and lat step ups with much improved technique compared to last session, though he still had difficulty due to weakness and he verbalized lack of confidence in that knee. Pt noted to have trendelenberg and antalgic on R during gait. Will continue to strengthen his hips and progress functional strength as tolerated.   Rehab Potential Good   PT Frequency 2x / week   PT Duration 4 weeks   PT Treatment/Interventions ADLs/Self Care Home Management;Cryotherapy;DME Instruction;Gait training;Stair training;Functional mobility training;Therapeutic activities;Therapeutic exercise;Balance training;Neuromuscular re-education;Patient/family education;Manual techniques;Passive range of motion;Dry needling   PT Next Visit Plan continue stretches, SL bridging, clamshells with GTB, sit <> stands with eccentric lowering, side stepping, continue fwd/lat step upso on 4" step; mini squats, gait training; continue SLS, add vectors, add UE movements (pulldowns, palov press, trunk rotations, etc.), add heel taps and step downs   PT Home Exercise Plan 6/18: LAQ, sit <> stands; 6/21: SLR, sidelying SLR, mini squats   Consulted and Agree with Plan of Care Patient      Patient will benefit from skilled therapeutic intervention in order to improve the following deficits and impairments:  Abnormal gait, Decreased balance, Decreased endurance, Decreased strength, Difficulty walking, Impaired flexibility, Decreased knowledge of use of DME  Visit Diagnosis: Difficulty in walking, not elsewhere classified  Muscle weakness (generalized)  Other symptoms and signs involving the musculoskeletal system     Problem  List Patient Active Problem List   Diagnosis Date Noted  . Pre-diabetes 11/19/2014  . Obesity 11/19/2014  . Hepatic cirrhosis ([place]) 11/19/2014  . Cirrhosis, nonalcoholic ([place]) 72/18/2883  . Other pancytopenia ([place]) 11/19/2014     Geraldine Solar PT, [place] [address] [place], [place], [address] Phone: [phone]   Fax:  [phone]  Name: Cody Austin MRN: [id number] Date of Birth: [birth year]

## 2016-10-13 ENCOUNTER — Ambulatory Visit (HOSPITAL_COMMUNITY): Payer: Medicare Other

## 2016-10-13 ENCOUNTER — Encounter (HOSPITAL_COMMUNITY): Payer: Self-pay

## 2016-10-13 DIAGNOSIS — M6281 Muscle weakness (generalized): Secondary | ICD-10-CM

## 2016-10-13 DIAGNOSIS — R262 Difficulty in walking, not elsewhere classified: Secondary | ICD-10-CM

## 2016-10-13 DIAGNOSIS — R29898 Other symptoms and signs involving the musculoskeletal system: Secondary | ICD-10-CM | POA: Diagnosis not present

## 2016-10-13 NOTE — Therapy (Signed)
[place], [place], [address] Phone: [phone]   Fax:  [phone]  Physical Therapy Treatment  Patient Details  Name: Cody Austin MRN: [id number] Date of Birth: Aug 23, 1950 Referring Provider: Flossie Dibble, MD  Encounter Date: 10/13/2016      PT End of Session - 10/13/16 0951    Visit Number 4   Number of Visits 9   Date for PT Re-Evaluation 11/01/16   Authorization Type Medicare Part A and B (Secondary: BCBS Supplement)   Authorization Time Period 10/04/16 to 11/01/16   Authorization - Visit Number 4   Authorization - Number of Visits 10   PT Start Time 0950   PT Stop Time 1030   PT Time Calculation (min) 40 min   Activity Tolerance Patient tolerated treatment well;No increased pain   Behavior During Therapy WFL for tasks assessed/performed      Past Medical History:  Diagnosis Date  . Arthritis   . Chronic kidney disease    left renal stone  . Diabetes mellitus without complication ([place])    Pre-Diabetes per MD  . Fatty liver   . Heart murmur   . Other pancytopenia ([place]) 11/19/2014  . Sleep apnea    uses CPAP    Past Surgical History:  Procedure Laterality Date  . CATARACT EXTRACTION W/PHACO Right 08/12/2014   Procedure: CATARACT EXTRACTION PHACO AND INTRAOCULAR LENS PLACEMENT (IOC);  Surgeon: [name], MD;  Location: [facility];  Service: Ophthalmology;  Laterality: Right;  CDE 9.32  . CATARACT EXTRACTION W/PHACO Left 08/26/2014   Procedure: CATARACT EXTRACTION PHACO AND INTRAOCULAR LENS PLACEMENT (IOC);  Surgeon: [name], MD;  Location: [facility];  Service: Ophthalmology;  Laterality: Left;  CDE:  9.49  . COLONOSCOPY N/A 12/13/2014   Procedure: COLONOSCOPY;  Surgeon: Rogene Houston, MD;  [facility];  Service: Endoscopy;  Laterality: N/A;  100  . ESOPHAGOGASTRODUODENOSCOPY N/A 12/13/2014   Procedure: ESOPHAGOGASTRODUODENOSCOPY (EGD);  Surgeon: Rogene Houston, MD;  [facility];   Service: Endoscopy;  Laterality: N/A;  . HERNIA REPAIR Right    Inguinal hernia    There were no vitals filed for this visit.      Subjective Assessment - 10/13/16 0951    Subjective Pt states that he was sore following last treatment. He states that he is just stiff this morning, but not in pain.   Limitations Walking;House hold activities   How long can you sit comfortably? no issues   How long can you stand comfortably? hasn't tried   How long can you walk comfortably? hasn't had to walk far enough that he would need to sit; he is only walking to and from the bathroom   Patient Stated Goals get off the crutch, get back to being able to use his knee   Currently in Pain? No/denies   Pain Onset 1 to 4 weeks ago              [facility] Adult PT Treatment/Exercise - 10/13/16 0001      Knee/Hip Exercises: Stretches   Active Hamstring Stretch Right;3 reps;30 seconds   Active Hamstring Stretch Limitations supine with rope   Quad Stretch Right;3 reps;30 seconds   Quad Stretch Limitations prone with rope   Gastroc Stretch Both;3 reps;30 seconds   Gastroc Stretch Limitations slant board     Knee/Hip Exercises: Standing   Heel Raises Both;1 set;15 reps   Heel Raises Limitations heel and toe   Knee  Flexion Strengthening;Right;15 reps   Knee Flexion Limitations 3#   Lateral Step Up Right;10 reps;Hand Hold: 0;Step Height: 4"   Forward Step Up Right;10 reps;Step Height: 4";Hand Hold: 0   Step Down Right;10 reps;Step Height: 4";Hand Hold: 2   Step Down Limitations BUE support to take some weight off of R knee   SLS 5x10" each on foam   SLS with Vectors BLE on foam, x5 reps with 2-3 sec hold each   Gait Training x1 lap around gym, cues to decrease R foot ER   Other Standing Knee Exercises attempted heel taps from 4" step (2 reps) but pt had increased R knee pain and difficulty performing so hold off on this for now.     Knee/Hip Exercises: Supine   Single Leg Bridge Both;3 sets;5 reps      Knee/Hip Exercises: Sidelying   Clams BLE, 2 sets x 10 reps with GTB              PT Education - 10/13/16 1031    Education provided Yes   Education Details decrease foot ER with gait, add HS stretch to HEP, exercise technique   Person(s) Educated Patient   Methods Explanation;Demonstration   Comprehension Verbalized understanding;Returned demonstration          PT Short Term Goals - 10/04/16 1356      PT SHORT TERM GOAL #1   Title Pt will be independent with HEP and perform consistently in order to maximize return to PLOF.   Time 2   Period Weeks   Status New     PT SHORT TERM GOAL #2   Title Pt will have improved 5xSTS to 10 sec or < from chair with no UE support to demonstrate improved overall BLE strength and power.   Time 2   Period Weeks   Status New     PT SHORT TERM GOAL #3   Title Pt will have improved BLE strength to 5/5 throughout in order to maximize gait and and return to PLOF.   Time 2   Period Weeks   Status New           PT Long Term Goals - 10/04/16 1400      PT LONG TERM GOAL #1   Title Pt will have improved SLS on RLE to at least 30 sec to demonstrate improved balance and maximize gait on uneven ground.   Time 4   Period Weeks   Status New     PT LONG TERM GOAL #2   Title Pt will have improved TUG with LRAD to 12 seconds or < to demonstrate improved balance, gait, and overall function in order to maximize function in the community.   Time 4   Period Weeks   Status New     PT LONG TERM GOAL #3   Title Pt will have improved 3MWT to at least 62f with LRAD and with proper gait mechanics in order to demonstrate improved overall gait, balance, and function in order to maximize pt's return to his regular walking program.   Time 4   Period Weeks   Status New               Plan - 10/13/16 1031    Clinical Impression Statement Session continued to focus on flexibility, strengthening, and dynamic stability of hips/knees. Pt  continues to demo functional weakness as evidenced by difficulty with step downs and heel taps. Pt with slight increased pain at EOS so PT encouraged  pt to ice when he got home. Continue POC as planned.    Rehab Potential Good   PT Frequency 2x / week   PT Duration 4 weeks   PT Treatment/Interventions ADLs/Self Care Home Management;Cryotherapy;DME Instruction;Gait training;Stair training;Functional mobility training;Therapeutic activities;Therapeutic exercise;Balance training;Neuromuscular re-education;Patient/family education;Manual techniques;Passive range of motion;Dry needling   PT Next Visit Plan stool scoots, cybex machines, lunging, dynamic hip stability (SLS with pulldowns, palov press, etc.), continue gross and functional strengthening of BLE   PT Home Exercise Plan 6/18: LAQ, sit <> stands; 6/21: SLR, sidelying SLR, mini squats; 6/27: HS stretch   Consulted and Agree with Plan of Care Patient      Patient will benefit from skilled therapeutic intervention in order to improve the following deficits and impairments:  Abnormal gait, Decreased balance, Decreased endurance, Decreased strength, Difficulty walking, Impaired flexibility, Decreased knowledge of use of DME  Visit Diagnosis: Difficulty in walking, not elsewhere classified  Muscle weakness (generalized)  Other symptoms and signs involving the musculoskeletal system     Problem List Patient Active Problem List   Diagnosis Date Noted  . Pre-diabetes 11/19/2014  . Obesity 11/19/2014  . Hepatic cirrhosis ([place]) 11/19/2014  . Cirrhosis, nonalcoholic ([place]) 16/01/9603  . Other pancytopenia ([place]) 11/19/2014     Geraldine Solar PT, [place] [address] [place], [place], [address] Phone: [phone]   Fax:  [phone]  Name: GENEVIEVE RITZEL MRN: [id number] Date of Birth: March 24, 1951

## 2016-10-18 ENCOUNTER — Ambulatory Visit (HOSPITAL_COMMUNITY): Payer: Medicare Other | Attending: Orthopedic Surgery

## 2016-10-18 ENCOUNTER — Encounter (HOSPITAL_COMMUNITY): Payer: Self-pay

## 2016-10-18 DIAGNOSIS — M6281 Muscle weakness (generalized): Secondary | ICD-10-CM | POA: Insufficient documentation

## 2016-10-18 DIAGNOSIS — R262 Difficulty in walking, not elsewhere classified: Secondary | ICD-10-CM | POA: Diagnosis not present

## 2016-10-18 DIAGNOSIS — R29898 Other symptoms and signs involving the musculoskeletal system: Secondary | ICD-10-CM | POA: Diagnosis not present

## 2016-10-18 NOTE — Therapy (Signed)
[name], [place], [address] Phone: [phone]   Fax:  [phone]  Physical Therapy Treatment  Patient Details  Name: Cody Austin MRN: [id number] Date of Birth: April 27, 1950 Referring Provider: Flossie Dibble, MD  Encounter Date: 10/18/2016      PT End of Session - 10/18/16 1350    Visit Number 5   Number of Visits 9   Date for PT Re-Evaluation 11/01/16   Authorization Type Medicare Part A and B (Secondary: BCBS Supplement)   Authorization Time Period 10/04/16 to 11/01/16   Authorization - Visit Number 5   Authorization - Number of Visits 10   PT Start Time 1350   PT Stop Time 6720   PT Time Calculation (min) 41 min   Activity Tolerance Patient tolerated treatment well;No increased pain   Behavior During Therapy WFL for tasks assessed/performed      Past Medical History:  Diagnosis Date  . Arthritis   . Chronic kidney disease    left renal stone  . Diabetes mellitus without complication ([place])    Pre-Diabetes per MD  . Fatty liver   . Heart murmur   . Other pancytopenia ([place]) 11/19/2014  . Sleep apnea    uses CPAP    Past Surgical History:  Procedure Laterality Date  . CATARACT EXTRACTION W/PHACO Right 08/12/2014   Procedure: CATARACT EXTRACTION PHACO AND INTRAOCULAR LENS PLACEMENT (IOC);  Surgeon: [name], MD;  Location: [facility];  Service: Ophthalmology;  Laterality: Right;  CDE 9.32  . CATARACT EXTRACTION W/PHACO Left 08/26/2014   Procedure: CATARACT EXTRACTION PHACO AND INTRAOCULAR LENS PLACEMENT (IOC);  Surgeon: [name], MD;  Location: [facility];  Service: Ophthalmology;  Laterality: Left;  CDE:  9.49  . COLONOSCOPY N/A 12/13/2014   Procedure: COLONOSCOPY;  Surgeon: Rogene Houston, MD;  Location: [facility];  Service: Endoscopy;  Laterality: N/A;  100  . ESOPHAGOGASTRODUODENOSCOPY N/A 12/13/2014   Procedure: ESOPHAGOGASTRODUODENOSCOPY (EGD);  Surgeon: Rogene Houston, MD;  Location: [facility];   Service: Endoscopy;  Laterality: N/A;  . HERNIA REPAIR Right    Inguinal hernia    There were no vitals filed for this visit.      Subjective Assessment - 10/18/16 1350    Subjective Pt states that his R knee is feeling fine today. He still has difficulty with walking up/down inclines.   Limitations Walking;House hold activities   How long can you sit comfortably? no issues   How long can you stand comfortably? hasn't tried   How long can you walk comfortably? hasn't had to walk far enough that he would need to sit; he is only walking to and from the bathroom   Patient Stated Goals get off the crutch, get back to being able to use his knee   Currently in Pain? Yes   Pain Score 1    Pain Location Knee   Pain Orientation Right   Pain Descriptors / Indicators Sore   Pain Type Surgical pain   Pain Onset 1 to 4 weeks ago   Pain Frequency Intermittent   Aggravating Factors  walking on uneven ground   Pain Relieving Factors rest, ice   Effect of Pain on Daily Activities increases               [facility] Adult PT Treatment/Exercise - 10/18/16 0001      Knee/Hip Exercises: Stretches   Active Hamstring Stretch Right;3 reps;30 seconds   Active Hamstring Stretch Limitations supine  with sheet   Quad Stretch Right;3 reps;30 seconds   Quad Stretch Limitations prone with sheet     Knee/Hip Exercises: Aerobic   Tread Mill x3 mins on 7% incline at 2.32mh     Knee/Hip Exercises: Machines for Strengthening   Cybex Knee Extension 4 plates x15 reps   Cybex Knee Flexion 4 plates x15 reps     Knee/Hip Exercises: Standing   Terminal Knee Extension Limitations 10x3" holds with BTB   Gait Training x1 lap around gym at EOS     Knee/Hip Exercises: Seated   Stool Scoot - Round Trips fwd/retro [id number] with BTB     Knee/Hip Exercises: Prone   Hamstring Curl 2 sets;10 reps   Hamstring Curl Limitations 3#   Straight Leg Raises Right;2 sets;10 reps                PT Education -  10/18/16 1432    Education provided Yes   Education Details proper step up form, exercise technique, pain with incline/decline and uneven ground likely due to functional weakness of R knee musculature, can walk on TM at home using pain as his guide   Person(s) Educated Patient   Methods Explanation;Demonstration   Comprehension Verbalized understanding;Returned demonstration          PT Short Term Goals - 10/04/16 1356      PT SHORT TERM GOAL #1   Title Pt will be independent with HEP and perform consistently in order to maximize return to PLOF.   Time 2   Period Weeks   Status New     PT SHORT TERM GOAL #2   Title Pt will have improved 5xSTS to 10 sec or < from chair with no UE support to demonstrate improved overall BLE strength and power.   Time 2   Period Weeks   Status New     PT SHORT TERM GOAL #3   Title Pt will have improved BLE strength to 5/5 throughout in order to maximize gait and and return to PLOF.   Time 2   Period Weeks   Status New           PT Long Term Goals - 10/04/16 1400      PT LONG TERM GOAL #1   Title Pt will have improved SLS on RLE to at least 30 sec to demonstrate improved balance and maximize gait on uneven ground.   Time 4   Period Weeks   Status New     PT LONG TERM GOAL #2   Title Pt will have improved TUG with LRAD to 12 seconds or < to demonstrate improved balance, gait, and overall function in order to maximize function in the community.   Time 4   Period Weeks   Status New     PT LONG TERM GOAL #3   Title Pt will have improved 3MWT to at least 60023fith LRAD and with proper gait mechanics in order to demonstrate improved overall gait, balance, and function in order to maximize pt's return to his regular walking program.   Time 4   Period Weeks   Status New               Plan - 10/18/16 1433    Clinical Impression Statement Pt continues to c/o R anterior knee pain with ambulating up/down inclines and on uneven  ground. Had pt ambulate on TM at 7% incline; he did not have pain this date and he had symmetrical gait  pattern. PT explained to pt that he is likely having this anterior knee pain due to continued functional weakness of his R knee musculature. Rest of session focused on strengthening and functional tasks. Pt had difficulty with stair training and step ups and reported having the anterior knee pain; he was noted to have increased knee flexion during step ups and was quickly placing his L foot up on the step, indicating some quad weakness. Will continue to focus on BLE and functional strength.    Rehab Potential Good   PT Frequency 2x / week   PT Duration 4 weeks   PT Treatment/Interventions ADLs/Self Care Home Management;Cryotherapy;DME Instruction;Gait training;Stair training;Functional mobility training;Therapeutic activities;Therapeutic exercise;Balance training;Neuromuscular re-education;Patient/family education;Manual techniques;Passive range of motion;Dry needling   PT Next Visit Plan continue cybex machines (SL knee ext and flex, with eccentric control), lunging, dynamic hip stability (SLS with pulldowns, palov press, etc.), continue gross and functional strengthening of BLE; trial SL leg press on total gym   PT Home Exercise Plan 6/18: LAQ, sit <> stands; 6/21: SLR, sidelying SLR, mini squats; 6/27: HS stretch   Consulted and Agree with Plan of Care Patient      Patient will benefit from skilled therapeutic intervention in order to improve the following deficits and impairments:  Abnormal gait, Decreased balance, Decreased endurance, Decreased strength, Difficulty walking, Impaired flexibility, Decreased knowledge of use of DME  Visit Diagnosis: Difficulty in walking, not elsewhere classified  Muscle weakness (generalized)  Other symptoms and signs involving the musculoskeletal system     Problem List Patient Active Problem List   Diagnosis Date Noted  . Pre-diabetes 11/19/2014  .  Obesity 11/19/2014  . Hepatic cirrhosis ([place]) 11/19/2014  . Cirrhosis, nonalcoholic ([place]) 01/75/1025  . Other pancytopenia ([place]) 11/19/2014     Geraldine Solar PT, [place] [address] [place], [place], [address] Phone: [phone]   Fax:  [phone]  Name: NEHAN FLAUM MRN: [id number] Date of Birth: 09-10-1950

## 2016-10-21 ENCOUNTER — Encounter (HOSPITAL_COMMUNITY): Payer: Self-pay

## 2016-10-21 ENCOUNTER — Ambulatory Visit (HOSPITAL_COMMUNITY): Payer: Medicare Other

## 2016-10-21 DIAGNOSIS — R262 Difficulty in walking, not elsewhere classified: Secondary | ICD-10-CM

## 2016-10-21 DIAGNOSIS — R29898 Other symptoms and signs involving the musculoskeletal system: Secondary | ICD-10-CM

## 2016-10-21 DIAGNOSIS — M6281 Muscle weakness (generalized): Secondary | ICD-10-CM

## 2016-10-21 NOTE — Therapy (Signed)
[place], [place], [address] Phone: [phone]   Fax:  [phone]  Physical Therapy Treatment  Patient Details  Name: Cody Austin MRN: [id number] Date of Birth: 1950/07/11 Referring Provider: Flossie Dibble, MD  Encounter Date: 10/21/2016      PT End of Session - 10/21/16 1434    Visit Number 6   Number of Visits 9   Date for PT Re-Evaluation 11/01/16   Authorization Type Medicare Part A and B (Secondary: BCBS Supplement)   Authorization Time Period 10/04/16 to 11/01/16   Authorization - Visit Number 6   Authorization - Number of Visits 10   PT Start Time 1430   PT Stop Time 1515   PT Time Calculation (min) 45 min   Activity Tolerance Patient tolerated treatment well;No increased pain   Behavior During Therapy WFL for tasks assessed/performed      Past Medical History:  Diagnosis Date  . Arthritis   . Chronic kidney disease    left renal stone  . Diabetes mellitus without complication ([place])    Pre-Diabetes per MD  . Fatty liver   . Heart murmur   . Other pancytopenia ([place]) 11/19/2014  . Sleep apnea    uses CPAP    Past Surgical History:  Procedure Laterality Date  . CATARACT EXTRACTION W/PHACO Right 08/12/2014   Procedure: CATARACT EXTRACTION PHACO AND INTRAOCULAR LENS PLACEMENT (IOC);  Surgeon: [name], MD;  Location: [facility];  Service: Ophthalmology;  Laterality: Right;  CDE 9.32  . CATARACT EXTRACTION W/PHACO Left 08/26/2014   Procedure: CATARACT EXTRACTION PHACO AND INTRAOCULAR LENS PLACEMENT (IOC);  Surgeon: [name], MD;  Location: [facility];  Service: Ophthalmology;  Laterality: Left;  CDE:  9.49  . COLONOSCOPY N/A 12/13/2014   Procedure: COLONOSCOPY;  Surgeon: Rogene Houston, MD;  [facility];  Service: Endoscopy;  Laterality: N/A;  100  . ESOPHAGOGASTRODUODENOSCOPY N/A 12/13/2014   Procedure: ESOPHAGOGASTRODUODENOSCOPY (EGD);  Surgeon: Rogene Houston, MD;  [facility];   Service: Endoscopy;  Laterality: N/A;  . HERNIA REPAIR Right    Inguinal hernia    There were no vitals filed for this visit.      Subjective Assessment - 10/21/16 1433    Subjective Pt states that his knee is feeling pretty good today. He states he just has some stiffness in the mornings but no real pain right now.   Limitations Walking;House hold activities   How long can you sit comfortably? no issues   How long can you stand comfortably? hasn't tried   How long can you walk comfortably? hasn't had to walk far enough that he would need to sit; he is only walking to and from the bathroom   Patient Stated Goals get off the crutch, get back to being able to use his knee   Currently in Pain? No/denies   Pain Onset 1 to 4 weeks ago              [facility] Adult PT Treatment/Exercise - 10/21/16 0001      Knee/Hip Exercises: Aerobic   Stationary Bike x5 mins, L3, seat 12 (unbilled)     Knee/Hip Exercises: Machines for Strengthening   Cybex Knee Extension 4 plates x15 reps with BLE; 2 plates x 15 reps RLE only   Cybex Knee Flexion 4 plates x15 reps with BLE; 2 plates x 15 reps RLE only   Cybex Leg Press 32deg x15 reps BLE; 20deg R SL  leg press x15 reps     Knee/Hip Exercises: Standing   Forward Lunges Both;10 reps   Forward Lunges Limitations BUE support   Terminal Knee Extension Limitations 10x5" holds with BTB   Wall Squat Limitations wall slides 2 sets x 10 reps;    SLS SLS on firm and palov press with RTB x10 reps each   SLS with Vectors BLE on foam, x5 reps with 5 sec hold each   Gait Training x1 lap around gym at EOS              PT Short Term Goals - 10/04/16 1356      PT SHORT TERM GOAL #1   Title Pt will be independent with HEP and perform consistently in order to maximize return to PLOF.   Time 2   Period Weeks   Status New     PT SHORT TERM GOAL #2   Title Pt will have improved 5xSTS to 10 sec or < from chair with no UE support to demonstrate improved  overall BLE strength and power.   Time 2   Period Weeks   Status New     PT SHORT TERM GOAL #3   Title Pt will have improved BLE strength to 5/5 throughout in order to maximize gait and and return to PLOF.   Time 2   Period Weeks   Status New           PT Long Term Goals - 10/04/16 1400      PT LONG TERM GOAL #1   Title Pt will have improved SLS on RLE to at least 30 sec to demonstrate improved balance and maximize gait on uneven ground.   Time 4   Period Weeks   Status New     PT LONG TERM GOAL #2   Title Pt will have improved TUG with LRAD to 12 seconds or < to demonstrate improved balance, gait, and overall function in order to maximize function in the community.   Time 4   Period Weeks   Status New     PT LONG TERM GOAL #3   Title Pt will have improved 3MWT to at least 672f with LRAD and with proper gait mechanics in order to demonstrate improved overall gait, balance, and function in order to maximize pt's return to his regular walking program.   Time 4   Period Weeks   Status New               Plan - 10/21/16 1516    Clinical Impression Statement Pt did very well with therex this date, only requiring min cues for proper technique. He did demo fatigue by EOS as demo by shaking and difficulty with the exercises towards the end of the session. Overall pt conitnues to progress nicely. His walking at EOS was much smoother and more fluid than at beginning and from previous sessions. Continue POC as planned.    Rehab Potential Good   PT Frequency 2x / week   PT Duration 4 weeks   PT Treatment/Interventions ADLs/Self Care Home Management;Cryotherapy;DME Instruction;Gait training;Stair training;Functional mobility training;Therapeutic activities;Therapeutic exercise;Balance training;Neuromuscular re-education;Patient/family education;Manual techniques;Passive range of motion;Dry needling   PT Next Visit Plan continue cybex machines (SL knee ext and flex, with  eccentric control), lunging, dynamic hip stability (SLS with pulldowns, palov press, etc.), continue gross and functional strengthening of BLE; continue SL leg press on total gym   PT Home Exercise Plan 6/18: LAQ, sit <> stands; 6/21: SLR,  sidelying SLR, mini squats; 6/27: HS stretch; 7/5: SLS and vectors on pillow   Consulted and Agree with Plan of Care Patient      Patient will benefit from skilled therapeutic intervention in order to improve the following deficits and impairments:  Abnormal gait, Decreased balance, Decreased endurance, Decreased strength, Difficulty walking, Impaired flexibility, Decreased knowledge of use of DME  Visit Diagnosis: Difficulty in walking, not elsewhere classified  Muscle weakness (generalized)  Other symptoms and signs involving the musculoskeletal system     Problem List Patient Active Problem List   Diagnosis Date Noted  . Pre-diabetes 11/19/2014  . Obesity 11/19/2014  . Hepatic cirrhosis ([place]) 11/19/2014  . Cirrhosis, nonalcoholic ([place]) 17/92/1783  . Other pancytopenia ([place]) 11/19/2014     Geraldine Solar PT, [place] [address] [place], [place], [address] Phone: [phone]   Fax:  [phone]  Name: MARCELINO CAMPOS MRN: [id number] Date of Birth: 07-Jun-1950

## 2016-10-25 ENCOUNTER — Encounter (HOSPITAL_COMMUNITY): Payer: Self-pay

## 2016-10-25 ENCOUNTER — Ambulatory Visit (HOSPITAL_COMMUNITY): Payer: Medicare Other

## 2016-10-25 DIAGNOSIS — M6281 Muscle weakness (generalized): Secondary | ICD-10-CM | POA: Diagnosis not present

## 2016-10-25 DIAGNOSIS — R29898 Other symptoms and signs involving the musculoskeletal system: Secondary | ICD-10-CM

## 2016-10-25 DIAGNOSIS — R262 Difficulty in walking, not elsewhere classified: Secondary | ICD-10-CM

## 2016-10-25 NOTE — Patient Instructions (Signed)
  SINGLE LEG BRIDGE  While lying on your back with your knees bent, extend one knee as shown.   Next, raise your buttocks off the floor/bed.    Try and maintain your pelvis level the entire time.  Perform 1x/day, 2-3 sets of 10 reps on each side   Lunges  Take large step forward with one foot and bend at both knees as if driving back knee down toward the floor. Be aware not to let front knee go past toes. Return and repeat with other leg  Perform 1x/day, 2-3 sets of 10 reps on each side    ELASTIC BAND LATERAL WALKS   With an elastic band around both ankles, walk to the side while keeping your feet spread apart. Keep your knees bent the entire time.  Perform 1x/day, 5-10 laps with GTB

## 2016-10-25 NOTE — Therapy (Signed)
[place], [place], [address] Phone: [phone]   Fax:  [phone]  Physical Therapy Treatment  Patient Details  Name: Cody Austin MRN: [id number] Date of Birth: 1950-06-01 Referring Provider: Flossie Dibble, MD  Encounter Date: 10/25/2016      PT End of Session - 10/25/16 1041    Visit Number 7   Number of Visits 9   Date for PT Re-Evaluation 11/01/16   Authorization Type Medicare Part A and B (Secondary: BCBS Supplement)   Authorization Time Period 10/04/16 to 11/01/16   Authorization - Visit Number 7   Authorization - Number of Visits 10   PT Start Time 6063   PT Stop Time 1115   PT Time Calculation (min) 43 min   Activity Tolerance Patient tolerated treatment well;No increased pain   Behavior During Therapy WFL for tasks assessed/performed      Past Medical History:  Diagnosis Date  . Arthritis   . Chronic kidney disease    left renal stone  . Diabetes mellitus without complication ([place])    Pre-Diabetes per MD  . Fatty liver   . Heart murmur   . Other pancytopenia ([place]) 11/19/2014  . Sleep apnea    uses CPAP    Past Surgical History:  Procedure Laterality Date  . CATARACT EXTRACTION W/PHACO Right 08/12/2014   Procedure: CATARACT EXTRACTION PHACO AND INTRAOCULAR LENS PLACEMENT (IOC);  Surgeon: [name], MD;  Location: [facility];  Service: Ophthalmology;  Laterality: Right;  CDE 9.32  . CATARACT EXTRACTION W/PHACO Left 08/26/2014   Procedure: CATARACT EXTRACTION PHACO AND INTRAOCULAR LENS PLACEMENT (IOC);  Surgeon: [name], MD;  Location: [facility];  Service: Ophthalmology;  Laterality: Left;  CDE:  9.49  . COLONOSCOPY N/A 12/13/2014   Procedure: COLONOSCOPY;  Surgeon: Rogene Houston, MD;  [facility];  Service: Endoscopy;  Laterality: N/A;  100  . ESOPHAGOGASTRODUODENOSCOPY N/A 12/13/2014   Procedure: ESOPHAGOGASTRODUODENOSCOPY (EGD);  Surgeon: Rogene Houston, MD;  [facility];   Service: Endoscopy;  Laterality: N/A;  . HERNIA REPAIR Right    Inguinal hernia    There were no vitals filed for this visit.      Subjective Assessment - 10/25/16 1040    Subjective Pt denies any pain this morning. He states he was able to walk, ride his stationary bike, and perform his HEP over the weekend with no increases in pain, just c/o BLE fatigue.   Limitations Walking;House hold activities   How long can you sit comfortably? no issues   How long can you stand comfortably? hasn't tried   How long can you walk comfortably? hasn't had to walk far enough that he would need to sit; he is only walking to and from the bathroom   Patient Stated Goals get off the crutch, get back to being able to use his knee   Currently in Pain? No/denies   Pain Onset 1 to 4 weeks ago              [facility] Adult PT Treatment/Exercise - 10/25/16 0001      Knee/Hip Exercises: Aerobic   Stationary Bike x5 mins, L3, seat 12 (unbilled)     Knee/Hip Exercises: Machines for Strengthening   Cybex Knee Extension RLE only 2 plates x 10 reps with 5 sec eccentric lowering     Knee/Hip Exercises: Standing   Forward Lunges Both;10 reps   Forward Lunges Limitations 1UE support; x5 reps each with  front foot on BOSU   Side Lunges Both;5 reps   Side Lunges Limitations foot on BOSU   Terminal Knee Extension Limitations 10x5" holds with BTB   Functional Squat 2 sets;10 reps   Functional Squat Limitations goblet squats with 10#   Stairs x5RT focusing on reciprocal pattern   Other Standing Knee Exercises sidestepping 72f x 3RT with GTB     Knee/Hip Exercises: Supine   Single Leg Bridge Both;2 sets;10 reps              PT Education - 10/25/16 1115    Education provided Yes   Education Details updated HEP, practice stairs at home   Person(s) Educated Patient   Methods Explanation;Demonstration;Handout   Comprehension Verbalized understanding;Returned demonstration          PT Short Term  Goals - 10/04/16 1356      PT SHORT TERM GOAL #1   Title Pt will be independent with HEP and perform consistently in order to maximize return to PLOF.   Time 2   Period Weeks   Status New     PT SHORT TERM GOAL #2   Title Pt will have improved 5xSTS to 10 sec or < from chair with no UE support to demonstrate improved overall BLE strength and power.   Time 2   Period Weeks   Status New     PT SHORT TERM GOAL #3   Title Pt will have improved BLE strength to 5/5 throughout in order to maximize gait and and return to PLOF.   Time 2   Period Weeks   Status New           PT Long Term Goals - 10/04/16 1400      PT LONG TERM GOAL #1   Title Pt will have improved SLS on RLE to at least 30 sec to demonstrate improved balance and maximize gait on uneven ground.   Time 4   Period Weeks   Status New     PT LONG TERM GOAL #2   Title Pt will have improved TUG with LRAD to 12 seconds or < to demonstrate improved balance, gait, and overall function in order to maximize function in the community.   Time 4   Period Weeks   Status New     PT LONG TERM GOAL #3   Title Pt will have improved 3MWT to at least 6097fwith LRAD and with proper gait mechanics in order to demonstrate improved overall gait, balance, and function in order to maximize pt's return to his regular walking program.   Time 4   Period Weeks   Status New               Plan - 10/25/16 1116    Clinical Impression Statement Session continued to focus on improving overall functional strength. Pt demo'd fatigue this date as he needed rest breaks in between some exercises. Pt still c/o difficulty with ambulating on uneven ground, like when he mows his yard or weed eats. He also states that he has the most pain with ascending/descending stairs. Stair training revealed good quality ascending with improved R quad strength noted, but decreased control with descent. Still think this is due to lack of functional strength of the  R quad. Educated pt to practice stair at home focusing on control throughout. Updated HEP to include more strengthening exercises to promote return to PLOF. Continue POC as planned.   Rehab Potential Good   PT Frequency 2x /  week   PT Duration 4 weeks   PT Treatment/Interventions ADLs/Self Care Home Management;Cryotherapy;DME Instruction;Gait training;Stair training;Functional mobility training;Therapeutic activities;Therapeutic exercise;Balance training;Neuromuscular re-education;Patient/family education;Manual techniques;Passive range of motion;Dry needling   PT Next Visit Plan continue cybex machines (SL knee ext and flex, with eccentric control), lunging, dynamic hip stability (SLS with pulldowns, palov press, etc.),add knee drives with TB for hip stability continue gross and functional strengthening of BLE; continue SL leg press on total gym; stair training, heel taps   PT Home Exercise Plan 6/18: LAQ, sit <> stands; 6/21: SLR, sidelying SLR, mini squats; 6/27: HS stretch; 7/5: SLS and vectors on pillow; 7/9: SL bridge, sidestepping, lunging   Consulted and Agree with Plan of Care Patient      Patient will benefit from skilled therapeutic intervention in order to improve the following deficits and impairments:  Abnormal gait, Decreased balance, Decreased endurance, Decreased strength, Difficulty walking, Impaired flexibility, Decreased knowledge of use of DME  Visit Diagnosis: Difficulty in walking, not elsewhere classified  Muscle weakness (generalized)  Other symptoms and signs involving the musculoskeletal system     Problem List Patient Active Problem List   Diagnosis Date Noted  . Pre-diabetes 11/19/2014  . Obesity 11/19/2014  . Hepatic cirrhosis ([place]) 11/19/2014  . Cirrhosis, nonalcoholic ([place]) 77/93/9030  . Other pancytopenia ([place]) 11/19/2014     Geraldine Solar PT, [place] [address] [place], [place],  [address] Phone: [phone]   Fax:  [phone]  Name: Cody Austin MRN: [id number] Date of Birth: 23-Jun-1950

## 2016-10-27 ENCOUNTER — Ambulatory Visit (HOSPITAL_COMMUNITY): Payer: Medicare Other

## 2016-10-27 ENCOUNTER — Encounter (HOSPITAL_COMMUNITY): Payer: Self-pay

## 2016-10-27 DIAGNOSIS — M6281 Muscle weakness (generalized): Secondary | ICD-10-CM

## 2016-10-27 DIAGNOSIS — R29898 Other symptoms and signs involving the musculoskeletal system: Secondary | ICD-10-CM | POA: Diagnosis not present

## 2016-10-27 DIAGNOSIS — R262 Difficulty in walking, not elsewhere classified: Secondary | ICD-10-CM | POA: Diagnosis not present

## 2016-10-27 NOTE — Therapy (Signed)
[place], [place], [address] Phone: [phone]   Fax:  [phone]  Physical Therapy Treatment  Patient Details  Name: Cody Austin MRN: [id number] Date of Birth: 12/09/1950 Referring Provider: Flossie Dibble, MD  Encounter Date: 10/27/2016      PT End of Session - 10/27/16 1119    Visit Number 8   Number of Visits 9   Date for PT Re-Evaluation 11/01/16   Authorization Type Medicare Part A and B (Secondary: BCBS Supplement)   Authorization Time Period 10/04/16 to 11/01/16   Authorization - Visit Number 8   Authorization - Number of Visits 10   PT Start Time 1115   PT Stop Time 1200   PT Time Calculation (min) 45 min   Activity Tolerance Patient tolerated treatment well;No increased pain   Behavior During Therapy WFL for tasks assessed/performed      Past Medical History:  Diagnosis Date  . Arthritis   . Chronic kidney disease    left renal stone  . Diabetes mellitus without complication ([place])    Pre-Diabetes per MD  . Fatty liver   . Heart murmur   . Other pancytopenia ([place]) 11/19/2014  . Sleep apnea    uses CPAP    Past Surgical History:  Procedure Laterality Date  . CATARACT EXTRACTION W/PHACO Right 08/12/2014   Procedure: CATARACT EXTRACTION PHACO AND INTRAOCULAR LENS PLACEMENT (IOC);  Surgeon: [name], MD;  Location: [facility];  Service: Ophthalmology;  Laterality: Right;  CDE 9.32  . CATARACT EXTRACTION W/PHACO Left 08/26/2014   Procedure: CATARACT EXTRACTION PHACO AND INTRAOCULAR LENS PLACEMENT (IOC);  Surgeon: [name], MD;  Location: [facility];  Service: Ophthalmology;  Laterality: Left;  CDE:  9.49  . COLONOSCOPY N/A 12/13/2014   Procedure: COLONOSCOPY;  Surgeon: Rogene Houston, MD;  Location: [facility];  Service: Endoscopy;  Laterality: N/A;  100  . ESOPHAGOGASTRODUODENOSCOPY N/A 12/13/2014   Procedure: ESOPHAGOGASTRODUODENOSCOPY (EGD);  Surgeon: Rogene Houston, MD;  [facility];   Service: Endoscopy;  Laterality: N/A;  . HERNIA REPAIR Right    Inguinal hernia    There were no vitals filed for this visit.      Subjective Assessment - 10/27/16 1118    Subjective Pt states that he feels pretty good this morning, just a little stiff.    Limitations Walking;House hold activities   How long can you sit comfortably? no issues   How long can you stand comfortably? hasn't tried   How long can you walk comfortably? hasn't had to walk far enough that he would need to sit; he is only walking to and from the bathroom   Patient Stated Goals get off the crutch, get back to being able to use his knee   Currently in Pain? No/denies   Pain Onset 1 to 4 weeks ago           [facility] Adult PT Treatment/Exercise - 10/27/16 0001      Knee/Hip Exercises: Aerobic   Stationary Bike x5 mins, L4, seat 12 (unbilled)     Knee/Hip Exercises: Standing   Forward Step Up Right;10 reps;Hand Hold: 2;Step Height: 4"   Forward Step Up Limitations cone for external cue to decrease hip IR/knee valgus   Step Down Right;10 reps;Hand Hold: 2;Step Height: 2"   Step Down Limitations heel taps with cone for external cue to decrease knee valgus   Functional Squat 2 sets;10 reps   Functional Squat Limitations goblet  squats with 10# and GTB around knees   Stairs x5 focusing on decreasing knee valgus/hip IR   SLS RLE only 10x10" holds resisting medial pull with BTB   Other Standing Knee Exercises knee drives on 8" step with BTB for hip stabilization (resisting medial pull) 10x10" each     Knee/Hip Exercises: Sidelying   Hip ABduction Right;15 reps   Hip ABduction Limitations RTB; x5reps with GTB   Clams RLE, 2 sets x 10 reps with GTB              PT Education - 10/27/16 1203    Education provided Yes   Education Details maintain hip ER with step ups/downs, and when ambulating on incline to decrease knee pain, updated HEP   Person(s) Educated Patient   Methods Explanation;Demonstration    Comprehension Verbalized understanding;Returned demonstration          PT Short Term Goals - 10/04/16 1356      PT SHORT TERM GOAL #1   Title Pt will be independent with HEP and perform consistently in order to maximize return to PLOF.   Time 2   Period Weeks   Status New     PT SHORT TERM GOAL #2   Title Pt will have improved 5xSTS to 10 sec or < from chair with no UE support to demonstrate improved overall BLE strength and power.   Time 2   Period Weeks   Status New     PT SHORT TERM GOAL #3   Title Pt will have improved BLE strength to 5/5 throughout in order to maximize gait and and return to PLOF.   Time 2   Period Weeks   Status New           PT Long Term Goals - 10/04/16 1400      PT LONG TERM GOAL #1   Title Pt will have improved SLS on RLE to at least 30 sec to demonstrate improved balance and maximize gait on uneven ground.   Time 4   Period Weeks   Status New     PT LONG TERM GOAL #2   Title Pt will have improved TUG with LRAD to 12 seconds or < to demonstrate improved balance, gait, and overall function in order to maximize function in the community.   Time 4   Period Weeks   Status New     PT LONG TERM GOAL #3   Title Pt will have improved 3MWT to at least 644f with LRAD and with proper gait mechanics in order to demonstrate improved overall gait, balance, and function in order to maximize pt's return to his regular walking program.   Time 4   Period Weeks   Status New               Plan - 10/27/16 1204    Clinical Impression Statement Pt did very well with therapy this date. Pt noted to have increased hip IR with step ups so PT cued pt to maintain hip ER/knee towards outside of foot and this drastically improved the quality of movement and pt had no pain. Rest of session focused on hip stability maintaining hip ER/resisting hip IR forces. Pt encouraged to continue to maintain his knee over outside of foot when ambulating stairs/inclines  between now and next session and was educated that this movement pattern will take a while to fully integrate into everyday life. He verbalized understanding. Pt returns to his doctor on 7/19 for f/u. Pt  wishes to stay with this PT for his last visit so his reassessment will be performed on 7/25 since PT is off next week. He will be reassessed at that time and likely discharged.   Rehab Potential Good   PT Frequency 2x / week   PT Duration 4 weeks   PT Treatment/Interventions ADLs/Self Care Home Management;Cryotherapy;DME Instruction;Gait training;Stair training;Functional mobility training;Therapeutic activities;Therapeutic exercise;Balance training;Neuromuscular re-education;Patient/family education;Manual techniques;Passive range of motion;Dry needling   PT Next Visit Plan reassess   PT Home Exercise Plan 6/18: LAQ, sit <> stands; 6/21: SLR, sidelying SLR, mini squats; 6/27: HS stretch; 7/5: SLS and vectors on pillow; 7/9: SL bridge, sidestepping, lunging; 7/11: progress sidelying hip abd to GTB and added sidelying clams with GTB   Consulted and Agree with Plan of Care Patient      Patient will benefit from skilled therapeutic intervention in order to improve the following deficits and impairments:  Abnormal gait, Decreased balance, Decreased endurance, Decreased strength, Difficulty walking, Impaired flexibility, Decreased knowledge of use of DME  Visit Diagnosis: Difficulty in walking, not elsewhere classified  Muscle weakness (generalized)  Other symptoms and signs involving the musculoskeletal system     Problem List Patient Active Problem List   Diagnosis Date Noted  . Pre-diabetes 11/19/2014  . Obesity 11/19/2014  . Hepatic cirrhosis ([place]) 11/19/2014  . Cirrhosis, nonalcoholic ([place]) 00/71/2197  . Other pancytopenia ([place]) 11/19/2014     Geraldine Solar PT, [facility] [place] [address] [place], [place], [address] Phone:  [phone]   Fax:  [phone]  Name: Cody Austin MRN: [id number] Date of Birth: 1950/07/19

## 2016-11-04 DIAGNOSIS — S83241D Other tear of medial meniscus, current injury, right knee, subsequent encounter: Secondary | ICD-10-CM | POA: Diagnosis not present

## 2016-11-10 ENCOUNTER — Ambulatory Visit (HOSPITAL_COMMUNITY): Payer: Medicare Other

## 2016-11-10 ENCOUNTER — Encounter (HOSPITAL_COMMUNITY): Payer: Self-pay

## 2016-11-10 DIAGNOSIS — M6281 Muscle weakness (generalized): Secondary | ICD-10-CM | POA: Diagnosis not present

## 2016-11-10 DIAGNOSIS — R262 Difficulty in walking, not elsewhere classified: Secondary | ICD-10-CM | POA: Diagnosis not present

## 2016-11-10 DIAGNOSIS — R29898 Other symptoms and signs involving the musculoskeletal system: Secondary | ICD-10-CM | POA: Diagnosis not present

## 2016-11-10 NOTE — Therapy (Signed)
[facility] [place], [place], [address] Phone: [phone]   Fax:  [phone]  Physical Therapy Treatment/Discharge Summary  Patient Details  Name: Cody Austin MRN: [id number] Date of Birth: April 12, 1951 Referring Provider: Flossie Dibble, MD  Encounter Date: 11/10/2016      PT End of Session - 11/10/16 0945    Visit Number 9   Number of Visits 9   Date for PT Re-Evaluation 11/01/16   Authorization Type Medicare Part A and B (Secondary: BCBS Supplement)   Authorization Time Period 10/04/16 to 11/01/16   Authorization - Visit Number 9   Authorization - Number of Visits 10   PT Start Time 0945   PT Stop Time 1005   PT Time Calculation (min) 20 min   Activity Tolerance Patient tolerated treatment well;No increased pain   Behavior During Therapy WFL for tasks assessed/performed      Past Medical History:  Diagnosis Date  . Arthritis   . Chronic kidney disease    left renal stone  . Diabetes mellitus without complication ([place])    Pre-Diabetes per MD  . Fatty liver   . Heart murmur   . Other pancytopenia ([place]) 11/19/2014  . Sleep apnea    uses CPAP    Past Surgical History:  Procedure Laterality Date  . CATARACT EXTRACTION W/PHACO Right 08/12/2014   Procedure: CATARACT EXTRACTION PHACO AND INTRAOCULAR LENS PLACEMENT (IOC);  Surgeon: [name], MD;  Location: [facility];  Service: Ophthalmology;  Laterality: Right;  CDE 9.32  . CATARACT EXTRACTION W/PHACO Left 08/26/2014   Procedure: CATARACT EXTRACTION PHACO AND INTRAOCULAR LENS PLACEMENT (IOC);  Surgeon: [name], MD;  Location: [facility];  Service: Ophthalmology;  Laterality: Left;  CDE:  9.49  . COLONOSCOPY N/A 12/13/2014   Procedure: COLONOSCOPY;  Surgeon: Rogene Houston, MD;  [facility];  Service: Endoscopy;  Laterality: N/A;  100  . ESOPHAGOGASTRODUODENOSCOPY N/A 12/13/2014   Procedure: ESOPHAGOGASTRODUODENOSCOPY (EGD);  Surgeon: Rogene Houston, MD;   [facility];  Service: Endoscopy;  Laterality: N/A;  . HERNIA REPAIR Right    Inguinal hernia    There were no vitals filed for this visit.      Subjective Assessment - 11/10/16 0945    Subjective Pt states that his knee feels fine. He went out of town this weekend and he states that he had no trouble with anything except for the hills but other than that he had no pain.   Limitations Walking;House hold activities   How long can you sit comfortably? no issues   How long can you stand comfortably? hasn't tried   How long can you walk comfortably? hasn't had to walk far enough that he would need to sit; he is only walking to and from the bathroom   Patient Stated Goals get off the crutch, get back to being able to use his knee   Currently in Pain? No/denies   Pain Onset 1 to 4 weeks ago            [facility] PT Assessment - 11/10/16 0001      Strength   Right Hip Extension 5/5  was 4   Right Hip ABduction 5/5  was 4+   Left Hip Extension 5/5  was 4+   Left Hip ABduction 5/5  was 4+   Right Knee Extension 5/5  was 4+     Ambulation/Gait   Ambulation/Gait Yes   Ambulation Distance (Feet) 678 Feet  3MWT   Assistive device None   Gait Pattern Within Functional Limits     Balance   Balance Assessed Yes     Static Standing Balance   Static Standing - Balance Support No upper extremity supported   Static Standing Balance -  Activities  Single Leg Stance - Right Leg;Single Leg Stance - Left Leg   Static Standing - Comment/# of Minutes R: 30 sec     Standardized Balance Assessment   Standardized Balance Assessment Five Times Sit to Stand;Timed Up and Go Test   Five times sit to stand comments  8 sec from chair, no UE     Timed Up and Go Test   Normal TUG (seconds) 8.7  no AD              PT Education - 11/10/16 1006    Education provided Yes   Education Details discharge plans, continue current HEP, can begin to walk on TM and return to all normal  daily activities   Person(s) Educated Patient   Methods Explanation   Comprehension Verbalized understanding          PT Short Term Goals - 11/10/16 0948      PT SHORT TERM GOAL #1   Title Pt will be independent with HEP and perform consistently in order to maximize return to PLOF.   Time 2   Period Weeks   Status Achieved     PT SHORT TERM GOAL #2   Title Pt will have improved 5xSTS to 10 sec or < from chair with no UE support to demonstrate improved overall BLE strength and power.   Time 2   Period Weeks   Status Achieved     PT SHORT TERM GOAL #3   Title Pt will have improved BLE strength to 5/5 throughout in order to maximize gait and and return to PLOF.   Time 2   Period Weeks   Status Achieved           PT Long Term Goals - 11/10/16 0949      PT LONG TERM GOAL #1   Title Pt will have improved SLS on RLE to at least 30 sec to demonstrate improved balance and maximize gait on uneven ground.   Time 4   Period Weeks   Status Achieved     PT LONG TERM GOAL #2   Title Pt will have improved TUG with LRAD to 12 seconds or < to demonstrate improved balance, gait, and overall function in order to maximize function in the community.   Time 4   Period Weeks   Status Achieved     PT LONG TERM GOAL #3   Title Pt will have improved 3MWT to at least 610f with LRAD and with proper gait mechanics in order to demonstrate improved overall gait, balance, and function in order to maximize pt's return to his regular walking program.   Time 4   Period Weeks   Status Achieved               Plan - 11/10/16 1007    Clinical Impression Statement PT reassessed pt this date and he has made significant improvements since starting therapy. He has met all STG and LTG, and he verbalizes feeling 80-90% improved since beginning. He states that the only thing he still has a little difficulty with is hills and stairs but verbalizes he thinks he will always have a little difficulty  with this. Pt has made significant  improvements in overall MMT, functional strength, gait, and balance and is ready for discharge at this time. Pt educated he can return with a referral if he notices a significant decline in his function and he verbalized understanding.   Rehab Potential Good   PT Frequency 2x / week   PT Duration 4 weeks   PT Treatment/Interventions ADLs/Self Care Home Management;Cryotherapy;DME Instruction;Gait training;Stair training;Functional mobility training;Therapeutic activities;Therapeutic exercise;Balance training;Neuromuscular re-education;Patient/family education;Manual techniques;Passive range of motion;Dry needling   PT Next Visit Plan discharged this date   PT Home Exercise Plan 6/18: LAQ, sit <> stands; 6/21: SLR, sidelying SLR, mini squats; 6/27: HS stretch; 7/5: SLS and vectors on pillow; 7/9: SL bridge, sidestepping, lunging; 7/11: progress sidelying hip abd to GTB and added sidelying clams with GTB   Consulted and Agree with Plan of Care Patient      Patient will benefit from skilled therapeutic intervention in order to improve the following deficits and impairments:  Abnormal gait, Decreased balance, Decreased endurance, Decreased strength, Difficulty walking, Impaired flexibility, Decreased knowledge of use of DME  Visit Diagnosis: Difficulty in walking, not elsewhere classified  Muscle weakness (generalized)  Other symptoms and signs involving the musculoskeletal system       G-Codes - 12-08-16 1009    Functional Assessment Tool Used (Outpatient Only) clinical judgement, 5xSTS, SLS, MMT, TUG, 3MWT   Functional Limitation Mobility: Walking and moving around   Mobility: Walking and Moving Around Current Status ([id number]) 0 percent impaired, limited or restricted   Mobility: Walking and Moving Around Discharge Status [phone]) 0 percent impaired, limited or restricted      Problem List Patient Active Problem List   Diagnosis Date Noted  .  Pre-diabetes 11/19/2014  . Obesity 11/19/2014  . Hepatic cirrhosis ([place]) 11/19/2014  . Cirrhosis, nonalcoholic ([place]) 19/62/2297  . Other pancytopenia ([place]) 11/19/2014      PHYSICAL THERAPY DISCHARGE SUMMARY  Visits from Start of Care: 9  Current functional level related to goals / functional outcomes: See clinical impression above   Remaining deficits: Pt still has minor deficits in ambulating stairs and hills, otherwise he is at his PLOF.   Education / Equipment: Continue HEP, can return with referral if decline in function Plan: Patient agrees to discharge.  Patient goals were met. Patient is being discharged due to meeting the stated rehab goals.  ?????      Geraldine Solar PT, [facility] [address] [place], [place], [address] Phone: [phone]   Fax:  [phone]  Name: Cody Austin MRN: [id number] Date of Birth: Dec 28, 1950

## 2016-11-15 ENCOUNTER — Other Ambulatory Visit (INDEPENDENT_AMBULATORY_CARE_PROVIDER_SITE_OTHER): Payer: Self-pay | Admitting: Internal Medicine

## 2016-11-15 DIAGNOSIS — K7460 Unspecified cirrhosis of liver: Secondary | ICD-10-CM | POA: Diagnosis not present

## 2016-11-15 DIAGNOSIS — I10 Essential (primary) hypertension: Secondary | ICD-10-CM | POA: Diagnosis not present

## 2016-11-15 DIAGNOSIS — Z6834 Body mass index (BMI) 34.0-34.9, adult: Secondary | ICD-10-CM | POA: Diagnosis not present

## 2016-11-15 DIAGNOSIS — E6609 Other obesity due to excess calories: Secondary | ICD-10-CM | POA: Diagnosis not present

## 2016-11-15 DIAGNOSIS — Z125 Encounter for screening for malignant neoplasm of prostate: Secondary | ICD-10-CM | POA: Diagnosis not present

## 2016-11-15 DIAGNOSIS — E119 Type 2 diabetes mellitus without complications: Secondary | ICD-10-CM | POA: Diagnosis not present

## 2016-11-15 DIAGNOSIS — E785 Hyperlipidemia, unspecified: Secondary | ICD-10-CM | POA: Diagnosis not present

## 2016-11-15 DIAGNOSIS — Z1389 Encounter for screening for other disorder: Secondary | ICD-10-CM | POA: Diagnosis not present

## 2016-11-16 LAB — CBC WITH DIFFERENTIAL/PLATELET
BASOS ABS: 0 10*3/uL (ref 0.0–0.2)
Basos: 1 %
EOS (ABSOLUTE): 0.2 10*3/uL (ref 0.0–0.4)
Eos: 7 %
Hematocrit: 40.8 % (ref 37.5–51.0)
Hemoglobin: 14 g/dL (ref 13.0–17.7)
Immature Grans (Abs): 0 10*3/uL (ref 0.0–0.1)
Immature Granulocytes: 0 %
LYMPHS ABS: 0.9 10*3/uL (ref 0.7–3.1)
Lymphs: 26 %
MCH: 33.2 pg — ABNORMAL HIGH (ref 26.6–33.0)
MCHC: 34.3 g/dL (ref 31.5–35.7)
MCV: 97 fL (ref 79–97)
Monocytes Absolute: 0.4 10*3/uL (ref 0.1–0.9)
Monocytes: 11 %
NEUTROS ABS: 1.8 10*3/uL (ref 1.4–7.0)
Neutrophils: 55 %
PLATELETS: 99 10*3/uL — AB (ref 150–379)
RBC: 4.22 x10E6/uL (ref 4.14–5.80)
RDW: 14.9 % (ref 12.3–15.4)
WBC: 3.2 10*3/uL — ABNORMAL LOW (ref 3.4–10.8)

## 2016-11-16 LAB — HEPATIC FUNCTION PANEL
ALT: 32 IU/L (ref 0–44)
AST: 44 IU/L — ABNORMAL HIGH (ref 0–40)
Albumin: 4.2 g/dL (ref 3.6–4.8)
Alkaline Phosphatase: 92 IU/L (ref 39–117)
Bilirubin Total: 1.7 mg/dL — ABNORMAL HIGH (ref 0.0–1.2)
Bilirubin, Direct: 0.64 mg/dL — ABNORMAL HIGH (ref 0.00–0.40)
Total Protein: 7 g/dL (ref 6.0–8.5)

## 2016-11-16 LAB — AFP TUMOR MARKER: AFP, SERUM, TUMOR MARKER: 9.4 ng/mL — AB (ref 0.0–8.3)

## 2016-11-19 ENCOUNTER — Other Ambulatory Visit (INDEPENDENT_AMBULATORY_CARE_PROVIDER_SITE_OTHER): Payer: Self-pay | Admitting: *Deleted

## 2016-11-19 DIAGNOSIS — K7469 Other cirrhosis of liver: Secondary | ICD-10-CM

## 2016-11-19 DIAGNOSIS — D61818 Other pancytopenia: Secondary | ICD-10-CM

## 2016-12-16 ENCOUNTER — Ambulatory Visit (INDEPENDENT_AMBULATORY_CARE_PROVIDER_SITE_OTHER): Payer: Medicare Other | Admitting: Otolaryngology

## 2016-12-16 DIAGNOSIS — H903 Sensorineural hearing loss, bilateral: Secondary | ICD-10-CM

## 2017-01-31 ENCOUNTER — Other Ambulatory Visit (INDEPENDENT_AMBULATORY_CARE_PROVIDER_SITE_OTHER): Payer: Self-pay | Admitting: *Deleted

## 2017-01-31 DIAGNOSIS — K7460 Unspecified cirrhosis of liver: Secondary | ICD-10-CM

## 2017-02-08 DIAGNOSIS — D696 Thrombocytopenia, unspecified: Secondary | ICD-10-CM | POA: Diagnosis not present

## 2017-02-08 DIAGNOSIS — E6609 Other obesity due to excess calories: Secondary | ICD-10-CM | POA: Diagnosis not present

## 2017-02-08 DIAGNOSIS — E119 Type 2 diabetes mellitus without complications: Secondary | ICD-10-CM | POA: Diagnosis not present

## 2017-02-08 DIAGNOSIS — D61818 Other pancytopenia: Secondary | ICD-10-CM | POA: Diagnosis not present

## 2017-02-08 DIAGNOSIS — Z1389 Encounter for screening for other disorder: Secondary | ICD-10-CM | POA: Diagnosis not present

## 2017-02-08 DIAGNOSIS — K7581 Nonalcoholic steatohepatitis (NASH): Secondary | ICD-10-CM | POA: Diagnosis not present

## 2017-02-08 DIAGNOSIS — E785 Hyperlipidemia, unspecified: Secondary | ICD-10-CM | POA: Diagnosis not present

## 2017-02-08 DIAGNOSIS — Z23 Encounter for immunization: Secondary | ICD-10-CM | POA: Diagnosis not present

## 2017-02-08 DIAGNOSIS — I10 Essential (primary) hypertension: Secondary | ICD-10-CM | POA: Diagnosis not present

## 2017-02-08 DIAGNOSIS — Z6833 Body mass index (BMI) 33.0-33.9, adult: Secondary | ICD-10-CM | POA: Diagnosis not present

## 2017-02-18 DIAGNOSIS — E119 Type 2 diabetes mellitus without complications: Secondary | ICD-10-CM | POA: Diagnosis not present

## 2017-02-22 ENCOUNTER — Other Ambulatory Visit (INDEPENDENT_AMBULATORY_CARE_PROVIDER_SITE_OTHER): Payer: Self-pay | Admitting: Internal Medicine

## 2017-02-22 DIAGNOSIS — E6609 Other obesity due to excess calories: Secondary | ICD-10-CM | POA: Diagnosis not present

## 2017-02-22 DIAGNOSIS — K7460 Unspecified cirrhosis of liver: Secondary | ICD-10-CM | POA: Diagnosis not present

## 2017-02-22 DIAGNOSIS — Z23 Encounter for immunization: Secondary | ICD-10-CM | POA: Diagnosis not present

## 2017-02-22 DIAGNOSIS — D61818 Other pancytopenia: Secondary | ICD-10-CM | POA: Diagnosis not present

## 2017-02-22 DIAGNOSIS — Z6833 Body mass index (BMI) 33.0-33.9, adult: Secondary | ICD-10-CM | POA: Diagnosis not present

## 2017-02-23 LAB — AFP TUMOR MARKER: AFP, Serum, Tumor Marker: 11.1 ng/mL — ABNORMAL HIGH (ref 0.0–8.3)

## 2017-03-07 ENCOUNTER — Other Ambulatory Visit (INDEPENDENT_AMBULATORY_CARE_PROVIDER_SITE_OTHER): Payer: Self-pay | Admitting: *Deleted

## 2017-03-07 DIAGNOSIS — K7460 Unspecified cirrhosis of liver: Secondary | ICD-10-CM

## 2017-03-21 ENCOUNTER — Encounter (HOSPITAL_COMMUNITY): Payer: Self-pay | Admitting: Oncology

## 2017-03-21 ENCOUNTER — Other Ambulatory Visit: Payer: Self-pay

## 2017-03-21 ENCOUNTER — Encounter (HOSPITAL_COMMUNITY): Payer: Medicare Other | Attending: Oncology | Admitting: Oncology

## 2017-03-21 VITALS — BP 108/48 | HR 60 | Temp 98.2°F | Resp 18 | Wt 211.8 lb

## 2017-03-21 DIAGNOSIS — R772 Abnormality of alphafetoprotein: Secondary | ICD-10-CM

## 2017-03-21 DIAGNOSIS — R161 Splenomegaly, not elsewhere classified: Secondary | ICD-10-CM

## 2017-03-21 DIAGNOSIS — K7460 Unspecified cirrhosis of liver: Secondary | ICD-10-CM

## 2017-03-21 DIAGNOSIS — D696 Thrombocytopenia, unspecified: Secondary | ICD-10-CM

## 2017-03-21 DIAGNOSIS — Z8719 Personal history of other diseases of the digestive system: Secondary | ICD-10-CM

## 2017-03-21 NOTE — Progress Notes (Signed)
[facility] Initial Visit:  Patient Care Team: [name], MD as PCP - General (Family Medicine)  CHIEF COMPLAINTS/PURPOSE OF CONSULTATION:   No history exists.    HISTORY OF PRESENTING ILLNESS: Cody Austin 66 y.o. male is here because of  Thrombocytopenia.  Patient has been diagnosed to have low platelet count several years ago was evaluated by hematologist.  According to him multiple tests were done and it was concluded that patient has cirrhosis of liver (due to fatty liver) causing thrombocytopenia due to splenomegaly. Patient's platelet continues to Decline is now 99,000.  No bleeding reported.  Patient is being followed closely by gastroenterologist for cirrhosis of liver Recently was found to have slightly elevated alpha-fetoprotein (1 1.0) patient had undergone MRI scan in January and being scheduled for ultrasound of liver and being followed closely by gastroenterologist.  A repeat alpha-fetoprotein has been scheduled in 3 months. Patient does not drink does not smoke.  Retired from [employer], academic job several years ago  [place]:  Feels good.  Active.  No fevers, sweats or weight loss. PERFORMANCE STATUS (ECOG):  0 HEENT:  No visual changes, runny nose, sore throat, mouth sores or tenderness. Lungs: No shortness of breath or cough.  No hemoptysis. Cardiac:  No chest pain, palpitations, orthopnea, or PND. GI:  No nausea, vomiting, diarrhea, constipation, melena or hematochezia. History of cirrhosis of liver and slightly elevated alpha-fetoprotein Patient has MRI scan in January GU:  No urgency, frequency, dysuria, or hematuria. Musculoskeletal:  No back pain.  No joint pain.  No muscle tenderness. Extremities:  No pain or swelling. Skin:  No rashes or skin changes. Neuro:  No headache, numbness or weakness, balance or coordination issues. Endocrine:  No diabetes, thyroid issues, hot flashes or night sweats. Psych:  No mood  changes, depression or anxiety. Pain:  No focal pain. Review of systems:  All other systems reviewed and found to be negative.  MEDICAL HISTORY: Past Medical History:  Diagnosis Date  . Arthritis   . Chronic kidney disease    left renal stone  . Diabetes mellitus without complication ([place])    Pre-Diabetes per MD  . Fatty liver   . Heart murmur   . Other pancytopenia ([place]) 11/19/2014  . Sleep apnea    uses CPAP    SURGICAL HISTORY: Past Surgical History:  Procedure Laterality Date  . CATARACT EXTRACTION W/PHACO Right 08/12/2014   Procedure: CATARACT EXTRACTION PHACO AND INTRAOCULAR LENS PLACEMENT (IOC);  Surgeon: [name], MD;  Location: [facility];  Service: Ophthalmology;  Laterality: Right;  CDE 9.32  . CATARACT EXTRACTION W/PHACO Left 08/26/2014   Procedure: CATARACT EXTRACTION PHACO AND INTRAOCULAR LENS PLACEMENT (IOC);  Surgeon: [name], MD;  Location: [facility];  Service: Ophthalmology;  Laterality: Left;  CDE:  9.49  . COLONOSCOPY N/A 12/13/2014   Procedure: COLONOSCOPY;  Surgeon: Rogene Houston, MD;  [facility];  Service: Endoscopy;  Laterality: N/A;  100  . ESOPHAGOGASTRODUODENOSCOPY N/A 12/13/2014   Procedure: ESOPHAGOGASTRODUODENOSCOPY (EGD);  Surgeon: Rogene Houston, MD;  [facility];  Service: Endoscopy;  Laterality: N/A;  . HERNIA REPAIR Right    Inguinal hernia    SOCIAL HISTORY: Social History   Socioeconomic History  . Marital status: Married    Spouse name: Not on file  . Number of children: Not on file  . Years of education: Not on file  . Highest education level: Not on file  Social Needs  .  Financial resource strain: Not on file  . Food insecurity - worry: Not on file  . Food insecurity - inability: Not on file  . Transportation needs - medical: Not on file  . Transportation needs - non-medical: Not on file  Occupational History  . Not on file  Tobacco Use  . Smoking status: Never Smoker  . Smokeless tobacco: Never Used   Substance and Sexual Activity  . Alcohol use: Yes    Alcohol/week: 0.0 oz    Comment: occ  . Drug use: No  . Sexual activity: Not on file  Other Topics Concern  . Not on file  Social History Narrative  . Not on file    FAMILY HISTORY Family History  Problem Relation Age of Onset  . Cancer Father   . Aneurysm Father     ALLERGIES:  has No Known Allergies.  MEDICATIONS:  Current Outpatient Medications  Medication Sig Dispense Refill  . fenofibrate (TRICOR) 145 MG tablet Take 160 mg by mouth at bedtime.     Marland Kitchen HYDROcodone-acetaminophen (NORCO/VICODIN) 5-325 MG tablet Take 1 tablet by mouth as needed.   0  . metFORMIN (GLUCOPHAGE) 500 MG tablet Take 500 mg by mouth at bedtime.     . simvastatin (ZOCOR) 40 MG tablet Take 40 mg by mouth at bedtime.     No current facility-administered medications for this visit.     PHYSICAL EXAMINATION:  ECOG PERFORMANCE STATUS: 0 - Asymptomatic GENERAL:  Well developed, well nourished, sitting comfortably in the exam room in no acute distress. MENTAL STATUS:  Alert and oriented to person, place and time. No jaundice. ENT:  Oropharynx clear without lesion.  Tongue normal. Mucous membranes moist.  RESPIRATORY:  Clear to auscultation without rales, wheezes or rhonchi. CARDIOVASCULAR:  Regular rate and r soft systolic murmur, rub or gallop.  ABDOMEN:  Soft, non-tender, with active bowel sounds, and no hepatosplenomegaly.  No masses. BACK:  No CVA tenderness.  No tenderness on percussion of the back or rib cage. SKIN:  No rashes, ulcers or lesions. EXTREMITIES: No edema, no skin discoloration or tenderness.  No palpable cords. LYMPH NODES: No palpable cervical, supraclavicular, axillary or inguinal adenopathy  NEUROLOGICAL: Unremarkable. PSYCH:  Appropriate.     LABORATORY DATA: I have personally reviewed the data as listed:  Orders Only on 02/22/2017  Component Date Value Ref Range Status  . AFP, Serum, Tumor Marker 02/22/2017 11.1*  0.0 - 8.3 ng/mL Final   Roche ECLIA methodology    RADIOGRAPHIC STUDIES: Reviewed MRI scan from January of 2018.  Showing cirrhosis of liver and enlarged spleen Small pancreatic cyst ASSESSMENT/PLAN Thrombocytopenia most likely secondary to splenomegaly secondary to cirrhosis of liver No further investigation needed at this point in time.  Patient remains asymptomatic 2.  Slightly elevated alpha-fetoprotein being followed closely by gastroenterologist.  Last MRI scan in January of 2018 was negative for any intracellular cancer. Another ultrasound being arranged.  No further return clinic appointment at this point in time.  All questions were answered. The patient knows to call the clinic with any problems, questions or concerns.  This note was electronically signed.    Forest Gleason, MD  03/21/2017 8:58 AM

## 2017-03-21 NOTE — Patient Instructions (Signed)
[place] at [facility] Discharge Instructions  RECOMMENDATIONS MADE BY THE CONSULTANT AND ANY TEST RESULTS WILL BE SENT TO YOUR REFERRING PHYSICIAN.  You were seen today by Dr. Forest Gleason No need for further follow up  Thank you for choosing [place] at [facility] to provide your oncology and hematology care.  To afford each patient quality time with our provider, please arrive at least 15 minutes before your scheduled appointment time.    If you have a lab appointment with the [facility] please come in thru the  Main Entrance and check in at the main information desk  You need to re-schedule your appointment should you arrive 10 or more minutes late.  We strive to give you quality time with our providers, and arriving late affects you and other patients whose appointments are after yours.  Also, if you no show three or more times for appointments you may be dismissed from the clinic at the providers discretion.     Again, thank you for choosing [facility].  Our hope is that these requests will decrease the amount of time that you wait before being seen by our physicians.       _____________________________________________________________  Should you have questions after your visit to [facility], please contact our office at [phone] between the hours of 8:30 a.m. and 4:30 p.m.  Voicemails left after 4:30 p.m. will not be returned until the following business day.  For prescription refill requests, have your pharmacy contact our office.       Resources For Cancer Patients and their Caregivers ? American Cancer Society: Can assist with transportation, wigs, general needs, runs Look Good Feel Better.        (605) 872-9663 ? Cancer Care: Provides financial assistance, online support groups, medication/co-pay assistance.  1-800-813-HOPE 346-619-1038) ? [facility] Assists [place] cancer patients and their families through emotional , educational and financial support.  2531322046 ? [place] DSS Where to apply for food stamps, Medicaid and utility assistance. 614-602-0108 ? [employer]: Transportation to medical appointments. (814)157-7282 ? Social Security Administration: May apply for disability if have a Stage IV cancer. 416-468-3441 (905)584-3758 ? [employer], Disability and Transit Services: Assists with nutrition, care and transit needs. [place] Support Programs: @10RELATIVEDAYS @ > Cancer Support Group  2nd Tuesday of the month 1pm-2pm, Journey Room  > Creative Journey  3rd Tuesday of the month 1130am-1pm, Journey Room  > Look Good Feel Better  1st Wednesday of the month 10am-12 noon, Journey Room (Call [place] to register [phone])

## 2017-03-22 ENCOUNTER — Encounter (INDEPENDENT_AMBULATORY_CARE_PROVIDER_SITE_OTHER): Payer: Self-pay | Admitting: *Deleted

## 2017-04-15 ENCOUNTER — Telehealth (INDEPENDENT_AMBULATORY_CARE_PROVIDER_SITE_OTHER): Payer: Self-pay | Admitting: *Deleted

## 2017-04-15 NOTE — Telephone Encounter (Signed)
MR liver

## 2017-04-15 NOTE — Telephone Encounter (Signed)
Patient is on recall for repeat US and repeat MRI, does he need both or just one -- please advsie

## 2017-04-20 ENCOUNTER — Other Ambulatory Visit (INDEPENDENT_AMBULATORY_CARE_PROVIDER_SITE_OTHER): Payer: Self-pay | Admitting: Internal Medicine

## 2017-04-20 DIAGNOSIS — K769 Liver disease, unspecified: Secondary | ICD-10-CM

## 2017-04-20 DIAGNOSIS — K760 Fatty (change of) liver, not elsewhere classified: Secondary | ICD-10-CM

## 2017-04-20 NOTE — Telephone Encounter (Signed)
MRI sch'd 04/26/17 at 9:00 am (845), npo 4 hrs, patient aware

## 2017-04-25 ENCOUNTER — Ambulatory Visit (HOSPITAL_COMMUNITY): Payer: Medicare Other

## 2017-04-26 ENCOUNTER — Ambulatory Visit (HOSPITAL_COMMUNITY)
Admission: RE | Admit: 2017-04-26 | Discharge: 2017-04-26 | Disposition: A | Discharge status: Home / Self Care | Payer: Medicare Other | Source: Ambulatory Visit | Attending: Internal Medicine | Admitting: Internal Medicine

## 2017-04-26 DIAGNOSIS — K769 Liver disease, unspecified: Secondary | ICD-10-CM

## 2017-04-26 DIAGNOSIS — K862 Cyst of pancreas: Secondary | ICD-10-CM | POA: Insufficient documentation

## 2017-04-26 DIAGNOSIS — K766 Portal hypertension: Secondary | ICD-10-CM | POA: Diagnosis not present

## 2017-04-26 DIAGNOSIS — K760 Fatty (change of) liver, not elsewhere classified: Secondary | ICD-10-CM | POA: Diagnosis not present

## 2017-04-26 DIAGNOSIS — K7460 Unspecified cirrhosis of liver: Secondary | ICD-10-CM | POA: Insufficient documentation

## 2017-04-26 DIAGNOSIS — K8020 Calculus of gallbladder without cholecystitis without obstruction: Secondary | ICD-10-CM | POA: Insufficient documentation

## 2017-04-26 LAB — POCT I-STAT CREATININE: Creatinine, Ser: 1.1 mg/dL (ref 0.61–1.24)

## 2017-04-26 MED ORDER — GADOXETATE DISODIUM 0.25 MMOL/ML IV SOLN
10.0000 mL | Freq: Once | INTRAVENOUS | Status: AC | PRN
  Administered 2017-04-26: 10 mL via INTRAVENOUS

## 2017-05-13 ENCOUNTER — Encounter (INDEPENDENT_AMBULATORY_CARE_PROVIDER_SITE_OTHER): Payer: Self-pay | Admitting: *Deleted

## 2017-05-13 ENCOUNTER — Other Ambulatory Visit (INDEPENDENT_AMBULATORY_CARE_PROVIDER_SITE_OTHER): Payer: Self-pay | Admitting: *Deleted

## 2017-05-13 DIAGNOSIS — K7460 Unspecified cirrhosis of liver: Secondary | ICD-10-CM

## 2017-06-27 DIAGNOSIS — I10 Essential (primary) hypertension: Secondary | ICD-10-CM | POA: Diagnosis not present

## 2017-06-27 DIAGNOSIS — E119 Type 2 diabetes mellitus without complications: Secondary | ICD-10-CM | POA: Diagnosis not present

## 2017-06-27 DIAGNOSIS — D61818 Other pancytopenia: Secondary | ICD-10-CM | POA: Diagnosis not present

## 2017-06-27 DIAGNOSIS — K7460 Unspecified cirrhosis of liver: Secondary | ICD-10-CM | POA: Diagnosis not present

## 2017-06-27 DIAGNOSIS — E782 Mixed hyperlipidemia: Secondary | ICD-10-CM | POA: Diagnosis not present

## 2017-06-27 DIAGNOSIS — Z6832 Body mass index (BMI) 32.0-32.9, adult: Secondary | ICD-10-CM | POA: Diagnosis not present

## 2017-06-27 DIAGNOSIS — E6609 Other obesity due to excess calories: Secondary | ICD-10-CM | POA: Diagnosis not present

## 2017-06-27 DIAGNOSIS — K7581 Nonalcoholic steatohepatitis (NASH): Secondary | ICD-10-CM | POA: Diagnosis not present

## 2017-06-27 DIAGNOSIS — D696 Thrombocytopenia, unspecified: Secondary | ICD-10-CM | POA: Diagnosis not present

## 2017-06-27 DIAGNOSIS — E785 Hyperlipidemia, unspecified: Secondary | ICD-10-CM | POA: Diagnosis not present

## 2017-06-27 DIAGNOSIS — I110 Hypertensive heart disease with heart failure: Secondary | ICD-10-CM | POA: Diagnosis not present

## 2017-07-14 ENCOUNTER — Ambulatory Visit (INDEPENDENT_AMBULATORY_CARE_PROVIDER_SITE_OTHER): Payer: Medicare Other | Admitting: Otolaryngology

## 2017-07-14 DIAGNOSIS — H903 Sensorineural hearing loss, bilateral: Secondary | ICD-10-CM

## 2017-08-25 DIAGNOSIS — Z9849 Cataract extraction status, unspecified eye: Secondary | ICD-10-CM | POA: Diagnosis not present

## 2017-08-25 DIAGNOSIS — Z961 Presence of intraocular lens: Secondary | ICD-10-CM | POA: Diagnosis not present

## 2017-08-25 DIAGNOSIS — E119 Type 2 diabetes mellitus without complications: Secondary | ICD-10-CM | POA: Diagnosis not present

## 2017-10-04 ENCOUNTER — Ambulatory Visit (INDEPENDENT_AMBULATORY_CARE_PROVIDER_SITE_OTHER): Payer: Medicare Other | Admitting: Internal Medicine

## 2017-10-04 ENCOUNTER — Encounter (INDEPENDENT_AMBULATORY_CARE_PROVIDER_SITE_OTHER): Payer: Self-pay | Admitting: Internal Medicine

## 2017-10-04 VITALS — BP 116/60 | HR 62 | Temp 97.5°F | Resp 18 | Ht 67.0 in | Wt 217.5 lb

## 2017-10-04 DIAGNOSIS — R772 Abnormality of alphafetoprotein: Secondary | ICD-10-CM

## 2017-10-04 DIAGNOSIS — K862 Cyst of pancreas: Secondary | ICD-10-CM

## 2017-10-04 DIAGNOSIS — K7460 Unspecified cirrhosis of liver: Secondary | ICD-10-CM

## 2017-10-04 NOTE — Patient Instructions (Signed)
Ultrasound and blood work as planned.

## 2017-10-04 NOTE — Progress Notes (Signed)
Presenting complaint;  Follow-up for chronic liver disease.  Database and subjective:  Cody Austin is 67 year old Caucasian male who was cirrhosis secondary to NASH who is here for scheduled visit.  He was last seen in June 2018. He has a history of mildly elevated alpha-fetoprotein with more or less stable trend.  He was found to have indeterminate liver lesion on ultrasound of January 2018 leading to MR liver which revealed changes of cirrhosis portal hypertension and mild splenomegaly.  There were 2 tiny benign subcapsular cysts in anterior aspect of right hepatic lobe corresponding to findings of ultrasound.  He also had cholelithiasis as well as 2 pancreatic cysts and uncinate process larger measuring 10 mm. He had follow-up MR in January this year revealing stable pancreatic cysts cholelithiasis hepatic cirrhosis portal venous hypertension and splenomegaly but no evidence of ascites.  Small subcapsular cyst was noted in anterior right hepatic lobe and pancreatic cysts were stable.  He has no complaints.  He has very good appetite.  He is watching his diet.  Bowels move daily.  He denies melena or rectal bleeding.  He does yard work couple times a week and walks regularly.  He has lost 10 pounds since his last visit.  He says his A1c is down to 5.4.    Current Medications: Outpatient Encounter Medications as of 10/04/2017  Medication Sig  . atorvastatin (LIPITOR) 20 MG tablet   . fenofibrate (TRICOR) 145 MG tablet Take 160 mg by mouth at bedtime.   . [DISCONTINUED] metFORMIN (GLUCOPHAGE) 500 MG tablet Take 500 mg by mouth at bedtime.   . [DISCONTINUED] simvastatin (ZOCOR) 20 MG tablet Take 20 mg by mouth at bedtime.    No facility-administered encounter medications on file as of 10/04/2017.      Objective: Blood pressure 116/60, pulse 62, temperature (!) 97.5 F (36.4 C), temperature source Oral, resp. rate 18, height 5' 7"  (1.702 m), weight 217 lb 8 oz (98.7 kg). Patient is alert and in no  acute distress. Asterixis absent. Conjunctiva is pink. Sclera is nonicteric Oropharyngeal mucosa is normal. No neck masses or thyromegaly noted. Cardiac exam with regular rhythm normal S1 and S2. No murmur or gallop noted. Lungs are clear to auscultation. Abdomen is full.  It is soft and nontender without hepatosplenomegaly or masses. No LE edema or clubbing noted.  Labs/studies Results: Lab data from 06/27/2017 WBC 3.4, H&H 12.7 and 36.1 and platelet count 88K.  Glucose 113, BUN 20, creatinine 1.15 Serum sodium 140, potassium 4.8, chloride 107, CO2 25 Bilirubin 1.3, AP 74, AST 47, ALT 29 and albumin 4.0. Alpha-fetoprotein 8.7   Assessment:  #1.  Cirrhosis secondary to NASH.  He has well preserved hepatic function but he does have portal hypertension based on imaging studies.  He is up-to-date on hepatitis A and B vaccination. Last EGD was in August 2016 and was negative for esophageal varices.  Given the way he is doing I do not feel that he needs an EGD at this time.  May consider one within 1 to 2 years. Thrombocytopenia secondary to chronic liver disease is stable.  #2.  History of pancreatic cysts.  The cysts appear to be benign and appeared to be stable between MR study of January 2018 in January 2019 therefore no further work-up at this time..   Plan:  Patient encouraged to keep up his efforts at losing weight. Hopefully he will be down to 200 pounds by the time of his next visit in a year.   He will  have AFP and ultrasound next month Office visit in 1 year.

## 2017-10-05 ENCOUNTER — Other Ambulatory Visit (INDEPENDENT_AMBULATORY_CARE_PROVIDER_SITE_OTHER): Payer: Self-pay | Admitting: *Deleted

## 2017-10-05 DIAGNOSIS — K7460 Unspecified cirrhosis of liver: Secondary | ICD-10-CM

## 2017-10-10 ENCOUNTER — Other Ambulatory Visit (INDEPENDENT_AMBULATORY_CARE_PROVIDER_SITE_OTHER): Payer: Self-pay | Admitting: *Deleted

## 2017-10-10 ENCOUNTER — Encounter (INDEPENDENT_AMBULATORY_CARE_PROVIDER_SITE_OTHER): Payer: Self-pay | Admitting: *Deleted

## 2017-10-10 DIAGNOSIS — K7460 Unspecified cirrhosis of liver: Secondary | ICD-10-CM

## 2017-10-26 ENCOUNTER — Ambulatory Visit (HOSPITAL_COMMUNITY): Payer: Medicare Other

## 2017-12-05 ENCOUNTER — Other Ambulatory Visit (INDEPENDENT_AMBULATORY_CARE_PROVIDER_SITE_OTHER): Payer: Self-pay | Admitting: *Deleted

## 2017-12-05 DIAGNOSIS — K7460 Unspecified cirrhosis of liver: Secondary | ICD-10-CM

## 2017-12-05 NOTE — Progress Notes (Signed)
AFP

## 2017-12-06 DIAGNOSIS — Z0001 Encounter for general adult medical examination with abnormal findings: Secondary | ICD-10-CM | POA: Diagnosis not present

## 2017-12-06 DIAGNOSIS — G4730 Sleep apnea, unspecified: Secondary | ICD-10-CM | POA: Diagnosis not present

## 2017-12-06 DIAGNOSIS — R011 Cardiac murmur, unspecified: Secondary | ICD-10-CM | POA: Diagnosis not present

## 2017-12-06 DIAGNOSIS — Z125 Encounter for screening for malignant neoplasm of prostate: Secondary | ICD-10-CM | POA: Diagnosis not present

## 2017-12-06 DIAGNOSIS — Z6835 Body mass index (BMI) 35.0-35.9, adult: Secondary | ICD-10-CM | POA: Diagnosis not present

## 2017-12-06 DIAGNOSIS — K7460 Unspecified cirrhosis of liver: Secondary | ICD-10-CM | POA: Diagnosis not present

## 2017-12-06 DIAGNOSIS — D61818 Other pancytopenia: Secondary | ICD-10-CM | POA: Diagnosis not present

## 2017-12-06 DIAGNOSIS — E119 Type 2 diabetes mellitus without complications: Secondary | ICD-10-CM | POA: Diagnosis not present

## 2017-12-06 DIAGNOSIS — K7581 Nonalcoholic steatohepatitis (NASH): Secondary | ICD-10-CM | POA: Diagnosis not present

## 2017-12-06 DIAGNOSIS — Z1389 Encounter for screening for other disorder: Secondary | ICD-10-CM | POA: Diagnosis not present

## 2017-12-06 DIAGNOSIS — I10 Essential (primary) hypertension: Secondary | ICD-10-CM | POA: Diagnosis not present

## 2017-12-06 DIAGNOSIS — E6609 Other obesity due to excess calories: Secondary | ICD-10-CM | POA: Diagnosis not present

## 2017-12-06 DIAGNOSIS — E785 Hyperlipidemia, unspecified: Secondary | ICD-10-CM | POA: Diagnosis not present

## 2017-12-06 DIAGNOSIS — E782 Mixed hyperlipidemia: Secondary | ICD-10-CM | POA: Diagnosis not present

## 2017-12-07 LAB — AFP TUMOR MARKER: AFP, Serum, Tumor Marker: 9 ng/mL — ABNORMAL HIGH (ref 0.0–8.3)

## 2017-12-08 ENCOUNTER — Other Ambulatory Visit (INDEPENDENT_AMBULATORY_CARE_PROVIDER_SITE_OTHER): Payer: Self-pay | Admitting: *Deleted

## 2017-12-08 DIAGNOSIS — K7460 Unspecified cirrhosis of liver: Secondary | ICD-10-CM

## 2018-01-03 ENCOUNTER — Ambulatory Visit (HOSPITAL_COMMUNITY)
Admission: RE | Admit: 2018-01-03 | Discharge: 2018-01-03 | Disposition: A | Discharge status: Home / Self Care | Payer: Medicare Other | Source: Ambulatory Visit | Attending: Internal Medicine | Admitting: Internal Medicine

## 2018-01-03 DIAGNOSIS — K7460 Unspecified cirrhosis of liver: Secondary | ICD-10-CM | POA: Diagnosis not present

## 2018-01-03 DIAGNOSIS — K7689 Other specified diseases of liver: Secondary | ICD-10-CM | POA: Insufficient documentation

## 2018-01-03 DIAGNOSIS — K8020 Calculus of gallbladder without cholecystitis without obstruction: Secondary | ICD-10-CM | POA: Insufficient documentation

## 2018-02-22 DIAGNOSIS — Z23 Encounter for immunization: Secondary | ICD-10-CM | POA: Diagnosis not present

## 2018-05-15 ENCOUNTER — Encounter (INDEPENDENT_AMBULATORY_CARE_PROVIDER_SITE_OTHER): Payer: Self-pay | Admitting: *Deleted

## 2018-05-15 ENCOUNTER — Other Ambulatory Visit (INDEPENDENT_AMBULATORY_CARE_PROVIDER_SITE_OTHER): Payer: Self-pay | Admitting: *Deleted

## 2018-05-15 DIAGNOSIS — K7460 Unspecified cirrhosis of liver: Secondary | ICD-10-CM

## 2018-06-01 DIAGNOSIS — I10 Essential (primary) hypertension: Secondary | ICD-10-CM | POA: Diagnosis not present

## 2018-06-01 DIAGNOSIS — K7460 Unspecified cirrhosis of liver: Secondary | ICD-10-CM | POA: Diagnosis not present

## 2018-06-01 DIAGNOSIS — Z1389 Encounter for screening for other disorder: Secondary | ICD-10-CM | POA: Diagnosis not present

## 2018-06-01 DIAGNOSIS — E119 Type 2 diabetes mellitus without complications: Secondary | ICD-10-CM | POA: Diagnosis not present

## 2018-06-01 DIAGNOSIS — D61818 Other pancytopenia: Secondary | ICD-10-CM | POA: Diagnosis not present

## 2018-06-01 DIAGNOSIS — R011 Cardiac murmur, unspecified: Secondary | ICD-10-CM | POA: Diagnosis not present

## 2018-06-01 DIAGNOSIS — E785 Hyperlipidemia, unspecified: Secondary | ICD-10-CM | POA: Diagnosis not present

## 2018-06-01 DIAGNOSIS — Z6835 Body mass index (BMI) 35.0-35.9, adult: Secondary | ICD-10-CM | POA: Diagnosis not present

## 2018-06-01 DIAGNOSIS — E6609 Other obesity due to excess calories: Secondary | ICD-10-CM | POA: Diagnosis not present

## 2018-06-14 ENCOUNTER — Encounter (INDEPENDENT_AMBULATORY_CARE_PROVIDER_SITE_OTHER): Payer: Self-pay | Admitting: *Deleted

## 2018-06-26 ENCOUNTER — Other Ambulatory Visit (INDEPENDENT_AMBULATORY_CARE_PROVIDER_SITE_OTHER): Payer: Self-pay | Admitting: *Deleted

## 2018-06-26 DIAGNOSIS — K7469 Other cirrhosis of liver: Secondary | ICD-10-CM

## 2018-06-29 ENCOUNTER — Ambulatory Visit (HOSPITAL_COMMUNITY)
Admission: RE | Admit: 2018-06-29 | Discharge: 2018-06-29 | Disposition: A | Discharge status: Home / Self Care | Payer: Medicare Other | Source: Ambulatory Visit | Attending: Internal Medicine | Admitting: Internal Medicine

## 2018-06-29 ENCOUNTER — Other Ambulatory Visit: Payer: Self-pay

## 2018-06-29 DIAGNOSIS — K7460 Unspecified cirrhosis of liver: Secondary | ICD-10-CM | POA: Diagnosis not present

## 2018-06-29 DIAGNOSIS — K7469 Other cirrhosis of liver: Secondary | ICD-10-CM | POA: Diagnosis not present

## 2018-09-28 DIAGNOSIS — M25561 Pain in right knee: Secondary | ICD-10-CM | POA: Diagnosis not present

## 2018-11-20 ENCOUNTER — Other Ambulatory Visit: Payer: Self-pay

## 2018-11-20 ENCOUNTER — Encounter (INDEPENDENT_AMBULATORY_CARE_PROVIDER_SITE_OTHER): Payer: Self-pay | Admitting: Internal Medicine

## 2018-11-20 ENCOUNTER — Ambulatory Visit (INDEPENDENT_AMBULATORY_CARE_PROVIDER_SITE_OTHER): Payer: Medicare Other | Admitting: Internal Medicine

## 2018-11-20 VITALS — BP 139/75 | HR 59 | Temp 97.8°F | Resp 18 | Ht 67.0 in | Wt 228.1 lb

## 2018-11-20 DIAGNOSIS — K862 Cyst of pancreas: Secondary | ICD-10-CM

## 2018-11-20 DIAGNOSIS — K7460 Unspecified cirrhosis of liver: Secondary | ICD-10-CM | POA: Diagnosis not present

## 2018-11-20 NOTE — Progress Notes (Addendum)
Presenting complaint;  Follow-up for chronic liver disease.  Database and subjective:  Patient is 68 year old Caucasian male who has cirrhosis secondary to NASH who is here for scheduled visit.  He was last seen in June 2019.  He had screening ultrasound in March as planned. He has no complaints.  He is not doing schedule physical activity or walking on account of right knee pain.  He had intra-articular steroid shot about 5 weeks ago.  He says he does do yard work on a regular basis.  His last A1c was 5.4.  He said he had complete blood count and [place] in March this year.  His appetite is good.  He has gained 11 pounds since his last visit.  He feels his weight was less than 217 pounds before pandemic started.  He denies abdominal pain pruritus weakness or fatigue.  He does not drink coffee as recommended because he does not like it. He received hepatitis A and B vaccination few years ago.  Current Medications: Outpatient Encounter Medications as of 11/20/2018  Medication Sig  . atorvastatin (LIPITOR) 20 MG tablet Take 20 mg by mouth daily.   . fenofibrate (TRICOR) 145 MG tablet Take 145 mg by mouth at bedtime.   . Multiple Vitamins-Minerals (ZINC PO) Take 50 mg by mouth daily.   . Omega-3 Fatty Acids (FISH OIL OMEGA-3 PO) Take by mouth daily.   No facility-administered encounter medications on file as of 11/20/2018.      Objective: Blood pressure 139/75, pulse (!) 59, temperature 97.8 F (36.6 C), temperature source Oral, resp. rate 18, height 5' 7"  (1.702 m), weight 228 lb 1.6 oz (103.5 kg). Patient is alert and in no acute distress. Conjunctiva is pink. Sclera is nonicteric Oropharyngeal mucosa is normal. No neck masses or thyromegaly noted. Cardiac exam with regular rhythm normal S1 and S2. No murmur or gallop noted. Lungs are clear to auscultation. Abdomen is protuberant but soft and nontender.  Spleen is nonpalpable.  Liver edge is indistinct but does not appear to be  enlarged. No LE edema or clubbing noted.  Labs/studies Results:  AFP on 12/06/2017 was 9.0.  Ultrasound on 06/29/2018 revealed cirrhotic appearing liver Hepatic cyst but no suspicious lesions.  Assessment:  #1.  Cirrhosis secondary to NASH.  He has well compensated disease.  Last EGD 4 years ago was negative for esophageal varices.  I do not feel that there is any need for EGD at this time.  I am concerned about his weight gain.  He needs to be watching calorie intake and begin exercise or walking after consultation with his orthopedic surgeon so he would not cause further damage to his knees.  #2.  History of mildly elevated alpha-fetoprotein most likely due to cirrhosis.  He had MRI in January 2018 revealing benign cyst and liver.  #3.  History of pancreatic cyst discovered on MRI of January 2018.  He will be due for follow-up study end of this year or 2 years from his prior study.   Plan:  Request records of recent blood work from [facility] before any tests ordered. Patient encouraged to increase physical activity as tolerated and resume walking when cleared by his orthopedic surgeon. He should also watch calorie intake and try to lose some weight. MRI of liver and pancreas in December 2020. Office visit in 1 year.  Addendum  Lab data from 06/01/2018 reviewed AFP 7.3. WBC 3.4, H&H 13.3 and 36.7.  Platelet count 87K. Bilirubin 2.2, AP 98, total protein 6.7  with albumin of 3.7, AST 49 and ALT 29.  Glucose 112, BUN 18, creatinine 1.20 serum sodium 140, potassium 4.4, chloride 110, CO2 27  Patient was contacted.  He was not home. Message left with patient's wife. aFP is normal.  He remains with mildly elevated AST Thrombocytopenia stable.

## 2018-11-20 NOTE — Patient Instructions (Signed)
Will request copy of recent blood work and let she know. MR liver and pancreas to be scheduled and December 2020.

## 2018-12-12 ENCOUNTER — Encounter (INDEPENDENT_AMBULATORY_CARE_PROVIDER_SITE_OTHER): Payer: Self-pay | Admitting: *Deleted

## 2018-12-13 ENCOUNTER — Telehealth (INDEPENDENT_AMBULATORY_CARE_PROVIDER_SITE_OTHER): Payer: Self-pay | Admitting: *Deleted

## 2018-12-13 NOTE — Telephone Encounter (Signed)
Patient is on recall for 6 mth [place], you ofc note from 8/3 states MRI in December, does he need [place] now - please advise

## 2018-12-18 NOTE — Telephone Encounter (Signed)
Patient aware.

## 2018-12-18 NOTE — Telephone Encounter (Signed)
We should not do an ultrasound and wait for MRI

## 2019-01-15 ENCOUNTER — Other Ambulatory Visit (INDEPENDENT_AMBULATORY_CARE_PROVIDER_SITE_OTHER): Payer: Self-pay | Admitting: *Deleted

## 2019-01-15 DIAGNOSIS — R772 Abnormality of alphafetoprotein: Secondary | ICD-10-CM

## 2019-01-15 DIAGNOSIS — K7460 Unspecified cirrhosis of liver: Secondary | ICD-10-CM

## 2019-01-16 DIAGNOSIS — Z0001 Encounter for general adult medical examination with abnormal findings: Secondary | ICD-10-CM | POA: Diagnosis not present

## 2019-01-16 DIAGNOSIS — E6609 Other obesity due to excess calories: Secondary | ICD-10-CM | POA: Diagnosis not present

## 2019-01-16 DIAGNOSIS — D696 Thrombocytopenia, unspecified: Secondary | ICD-10-CM | POA: Diagnosis not present

## 2019-01-16 DIAGNOSIS — E782 Mixed hyperlipidemia: Secondary | ICD-10-CM | POA: Diagnosis not present

## 2019-01-16 DIAGNOSIS — E119 Type 2 diabetes mellitus without complications: Secondary | ICD-10-CM | POA: Diagnosis not present

## 2019-01-16 DIAGNOSIS — Z23 Encounter for immunization: Secondary | ICD-10-CM | POA: Diagnosis not present

## 2019-01-16 DIAGNOSIS — Z6834 Body mass index (BMI) 34.0-34.9, adult: Secondary | ICD-10-CM | POA: Diagnosis not present

## 2019-01-16 DIAGNOSIS — Z1389 Encounter for screening for other disorder: Secondary | ICD-10-CM | POA: Diagnosis not present

## 2019-01-16 DIAGNOSIS — R772 Abnormality of alphafetoprotein: Secondary | ICD-10-CM | POA: Diagnosis not present

## 2019-01-16 DIAGNOSIS — Z125 Encounter for screening for malignant neoplasm of prostate: Secondary | ICD-10-CM | POA: Diagnosis not present

## 2019-01-16 DIAGNOSIS — K7460 Unspecified cirrhosis of liver: Secondary | ICD-10-CM | POA: Diagnosis not present

## 2019-01-16 DIAGNOSIS — E785 Hyperlipidemia, unspecified: Secondary | ICD-10-CM | POA: Diagnosis not present

## 2019-01-16 DIAGNOSIS — Z0000 Encounter for general adult medical examination without abnormal findings: Secondary | ICD-10-CM | POA: Diagnosis not present

## 2019-01-16 DIAGNOSIS — I10 Essential (primary) hypertension: Secondary | ICD-10-CM | POA: Diagnosis not present

## 2019-01-17 LAB — AFP TUMOR MARKER: AFP, Serum, Tumor Marker: 9.1 ng/mL — ABNORMAL HIGH (ref 0.0–8.3)

## 2019-01-19 ENCOUNTER — Other Ambulatory Visit (INDEPENDENT_AMBULATORY_CARE_PROVIDER_SITE_OTHER): Payer: Self-pay | Admitting: *Deleted

## 2019-01-19 ENCOUNTER — Telehealth (INDEPENDENT_AMBULATORY_CARE_PROVIDER_SITE_OTHER): Payer: Self-pay | Admitting: *Deleted

## 2019-01-19 DIAGNOSIS — K7460 Unspecified cirrhosis of liver: Secondary | ICD-10-CM

## 2019-01-19 NOTE — Telephone Encounter (Signed)
Spoke to patient advise MRI due in December

## 2019-01-19 NOTE — Telephone Encounter (Signed)
Patient was called with his lab results. He ask when he was to have his [place], he thought it was in 6 months?

## 2019-02-17 DIAGNOSIS — Z20828 Contact with and (suspected) exposure to other viral communicable diseases: Secondary | ICD-10-CM | POA: Diagnosis not present

## 2019-03-13 ENCOUNTER — Encounter (INDEPENDENT_AMBULATORY_CARE_PROVIDER_SITE_OTHER): Payer: Self-pay | Admitting: *Deleted

## 2019-03-26 ENCOUNTER — Other Ambulatory Visit (INDEPENDENT_AMBULATORY_CARE_PROVIDER_SITE_OTHER): Payer: Self-pay | Admitting: *Deleted

## 2019-03-26 DIAGNOSIS — K862 Cyst of pancreas: Secondary | ICD-10-CM

## 2019-03-26 DIAGNOSIS — K760 Fatty (change of) liver, not elsewhere classified: Secondary | ICD-10-CM

## 2019-03-26 DIAGNOSIS — K769 Liver disease, unspecified: Secondary | ICD-10-CM

## 2019-03-26 NOTE — Progress Notes (Signed)
MRI Abdomen with/without contrast.

## 2019-03-30 ENCOUNTER — Other Ambulatory Visit: Payer: Self-pay

## 2019-03-30 ENCOUNTER — Ambulatory Visit (HOSPITAL_COMMUNITY)
Admission: RE | Admit: 2019-03-30 | Discharge: 2019-03-30 | Disposition: A | Discharge status: Home / Self Care | Payer: Medicare Other | Source: Ambulatory Visit | Attending: Internal Medicine | Admitting: Internal Medicine

## 2019-03-30 ENCOUNTER — Other Ambulatory Visit (INDEPENDENT_AMBULATORY_CARE_PROVIDER_SITE_OTHER): Payer: Self-pay | Admitting: Internal Medicine

## 2019-03-30 DIAGNOSIS — K769 Liver disease, unspecified: Secondary | ICD-10-CM | POA: Diagnosis not present

## 2019-03-30 DIAGNOSIS — K760 Fatty (change of) liver, not elsewhere classified: Secondary | ICD-10-CM | POA: Insufficient documentation

## 2019-03-30 DIAGNOSIS — K8020 Calculus of gallbladder without cholecystitis without obstruction: Secondary | ICD-10-CM | POA: Diagnosis not present

## 2019-03-30 DIAGNOSIS — K862 Cyst of pancreas: Secondary | ICD-10-CM | POA: Insufficient documentation

## 2019-03-30 LAB — POCT I-STAT CREATININE: Creatinine, Ser: 1 mg/dL (ref 0.61–1.24)

## 2019-03-30 MED ORDER — GADOBUTROL 1 MMOL/ML IV SOLN
10.0000 mL | Freq: Once | INTRAVENOUS | Status: AC | PRN
  Administered 2019-03-30: 10 mL via INTRAVENOUS

## 2019-04-01 NOTE — Progress Notes (Signed)
Patient called and message last results left on his answering service Pancreatic cystic lesion unchanged.  Measures 12 mm. Will consider follow-up study in 2 years as recommended by radiology. Please forward copy of this report to PCP.

## 2019-05-28 ENCOUNTER — Inpatient Hospital Stay (HOSPITAL_COMMUNITY)
Admission: EM | Admit: 2019-05-28 | Discharge: 2019-06-01 | DRG: 442 | Disposition: A | Discharge status: Home / Self Care | Payer: Medicare Other | Attending: Family Medicine | Admitting: Family Medicine

## 2019-05-28 ENCOUNTER — Encounter (HOSPITAL_COMMUNITY): Payer: Self-pay

## 2019-05-28 ENCOUNTER — Other Ambulatory Visit: Payer: Self-pay

## 2019-05-28 ENCOUNTER — Emergency Department (HOSPITAL_COMMUNITY): Payer: Medicare Other

## 2019-05-28 DIAGNOSIS — E669 Obesity, unspecified: Secondary | ICD-10-CM | POA: Diagnosis present

## 2019-05-28 DIAGNOSIS — I129 Hypertensive chronic kidney disease with stage 1 through stage 4 chronic kidney disease, or unspecified chronic kidney disease: Secondary | ICD-10-CM | POA: Diagnosis present

## 2019-05-28 DIAGNOSIS — K7200 Acute and subacute hepatic failure without coma: Secondary | ICD-10-CM | POA: Diagnosis present

## 2019-05-28 DIAGNOSIS — E876 Hypokalemia: Secondary | ICD-10-CM | POA: Diagnosis present

## 2019-05-28 DIAGNOSIS — R4781 Slurred speech: Secondary | ICD-10-CM | POA: Diagnosis not present

## 2019-05-28 DIAGNOSIS — G4733 Obstructive sleep apnea (adult) (pediatric): Secondary | ICD-10-CM | POA: Diagnosis not present

## 2019-05-28 DIAGNOSIS — U071 COVID-19: Secondary | ICD-10-CM | POA: Diagnosis present

## 2019-05-28 DIAGNOSIS — D6959 Other secondary thrombocytopenia: Secondary | ICD-10-CM | POA: Diagnosis present

## 2019-05-28 DIAGNOSIS — E7220 Disorder of urea cycle metabolism, unspecified: Secondary | ICD-10-CM

## 2019-05-28 DIAGNOSIS — K7581 Nonalcoholic steatohepatitis (NASH): Secondary | ICD-10-CM | POA: Diagnosis present

## 2019-05-28 DIAGNOSIS — I10 Essential (primary) hypertension: Secondary | ICD-10-CM | POA: Diagnosis not present

## 2019-05-28 DIAGNOSIS — Z6833 Body mass index (BMI) 33.0-33.9, adult: Secondary | ICD-10-CM

## 2019-05-28 DIAGNOSIS — M1990 Unspecified osteoarthritis, unspecified site: Secondary | ICD-10-CM | POA: Diagnosis present

## 2019-05-28 DIAGNOSIS — R0902 Hypoxemia: Secondary | ICD-10-CM | POA: Diagnosis not present

## 2019-05-28 DIAGNOSIS — R471 Dysarthria and anarthria: Secondary | ICD-10-CM | POA: Diagnosis present

## 2019-05-28 DIAGNOSIS — I739 Peripheral vascular disease, unspecified: Secondary | ICD-10-CM | POA: Diagnosis not present

## 2019-05-28 DIAGNOSIS — R569 Unspecified convulsions: Secondary | ICD-10-CM | POA: Diagnosis present

## 2019-05-28 DIAGNOSIS — D638 Anemia in other chronic diseases classified elsewhere: Secondary | ICD-10-CM | POA: Diagnosis not present

## 2019-05-28 DIAGNOSIS — E785 Hyperlipidemia, unspecified: Secondary | ICD-10-CM | POA: Diagnosis present

## 2019-05-28 DIAGNOSIS — Z9842 Cataract extraction status, left eye: Secondary | ICD-10-CM

## 2019-05-28 DIAGNOSIS — Z20822 Contact with and (suspected) exposure to covid-19: Secondary | ICD-10-CM | POA: Diagnosis present

## 2019-05-28 DIAGNOSIS — Z809 Family history of malignant neoplasm, unspecified: Secondary | ICD-10-CM

## 2019-05-28 DIAGNOSIS — K7682 Hepatic encephalopathy: Secondary | ICD-10-CM | POA: Diagnosis present

## 2019-05-28 DIAGNOSIS — Z961 Presence of intraocular lens: Secondary | ICD-10-CM | POA: Diagnosis present

## 2019-05-28 DIAGNOSIS — K8021 Calculus of gallbladder without cholecystitis with obstruction: Secondary | ICD-10-CM | POA: Diagnosis present

## 2019-05-28 DIAGNOSIS — K7290 Hepatic failure, unspecified without coma: Secondary | ICD-10-CM | POA: Diagnosis not present

## 2019-05-28 DIAGNOSIS — R410 Disorientation, unspecified: Secondary | ICD-10-CM | POA: Diagnosis not present

## 2019-05-28 DIAGNOSIS — E1151 Type 2 diabetes mellitus with diabetic peripheral angiopathy without gangrene: Secondary | ICD-10-CM | POA: Diagnosis present

## 2019-05-28 DIAGNOSIS — R29715 NIHSS score 15: Secondary | ICD-10-CM | POA: Diagnosis present

## 2019-05-28 DIAGNOSIS — R4182 Altered mental status, unspecified: Secondary | ICD-10-CM | POA: Diagnosis not present

## 2019-05-28 DIAGNOSIS — R404 Transient alteration of awareness: Secondary | ICD-10-CM | POA: Diagnosis not present

## 2019-05-28 DIAGNOSIS — Z8616 Personal history of COVID-19: Secondary | ICD-10-CM | POA: Diagnosis not present

## 2019-05-28 DIAGNOSIS — I6523 Occlusion and stenosis of bilateral carotid arteries: Secondary | ICD-10-CM | POA: Diagnosis not present

## 2019-05-28 DIAGNOSIS — Z79899 Other long term (current) drug therapy: Secondary | ICD-10-CM | POA: Diagnosis not present

## 2019-05-28 DIAGNOSIS — G9340 Encephalopathy, unspecified: Secondary | ICD-10-CM | POA: Diagnosis not present

## 2019-05-28 DIAGNOSIS — N183 Chronic kidney disease, stage 3 unspecified: Secondary | ICD-10-CM | POA: Diagnosis present

## 2019-05-28 DIAGNOSIS — Z9841 Cataract extraction status, right eye: Secondary | ICD-10-CM | POA: Diagnosis not present

## 2019-05-28 DIAGNOSIS — K8020 Calculus of gallbladder without cholecystitis without obstruction: Secondary | ICD-10-CM | POA: Diagnosis not present

## 2019-05-28 DIAGNOSIS — K7460 Unspecified cirrhosis of liver: Secondary | ICD-10-CM | POA: Diagnosis not present

## 2019-05-28 DIAGNOSIS — R29818 Other symptoms and signs involving the nervous system: Secondary | ICD-10-CM | POA: Diagnosis not present

## 2019-05-28 DIAGNOSIS — Z03818 Encounter for observation for suspected exposure to other biological agents ruled out: Secondary | ICD-10-CM | POA: Diagnosis not present

## 2019-05-28 DIAGNOSIS — R161 Splenomegaly, not elsewhere classified: Secondary | ICD-10-CM | POA: Diagnosis present

## 2019-05-28 DIAGNOSIS — G8321 Monoplegia of upper limb affecting right dominant side: Secondary | ICD-10-CM | POA: Diagnosis present

## 2019-05-28 DIAGNOSIS — N182 Chronic kidney disease, stage 2 (mild): Secondary | ICD-10-CM | POA: Diagnosis present

## 2019-05-28 DIAGNOSIS — D631 Anemia in chronic kidney disease: Secondary | ICD-10-CM | POA: Diagnosis present

## 2019-05-28 DIAGNOSIS — Z87442 Personal history of urinary calculi: Secondary | ICD-10-CM

## 2019-05-28 DIAGNOSIS — N281 Cyst of kidney, acquired: Secondary | ICD-10-CM | POA: Diagnosis not present

## 2019-05-28 HISTORY — DX: Unspecified cirrhosis of liver: K74.60

## 2019-05-28 HISTORY — DX: Peripheral vascular disease, unspecified: I73.9

## 2019-05-28 LAB — DIFFERENTIAL
Abs Immature Granulocytes: 0.01 10*3/uL (ref 0.00–0.07)
Basophils Absolute: 0 10*3/uL (ref 0.0–0.1)
Basophils Relative: 1 %
Eosinophils Absolute: 0.1 10*3/uL (ref 0.0–0.5)
Eosinophils Relative: 3 %
Immature Granulocytes: 0 %
Lymphocytes Relative: 24 %
Lymphs Abs: 0.7 10*3/uL (ref 0.7–4.0)
Monocytes Absolute: 0.4 10*3/uL (ref 0.1–1.0)
Monocytes Relative: 14 %
Neutro Abs: 1.6 10*3/uL — ABNORMAL LOW (ref 1.7–7.7)
Neutrophils Relative %: 58 %

## 2019-05-28 LAB — URINALYSIS, ROUTINE W REFLEX MICROSCOPIC
Bilirubin Urine: NEGATIVE
Glucose, UA: NEGATIVE mg/dL
Hgb urine dipstick: NEGATIVE
Ketones, ur: NEGATIVE mg/dL
Leukocytes,Ua: NEGATIVE
Nitrite: NEGATIVE
Protein, ur: NEGATIVE mg/dL
Specific Gravity, Urine: >1.046 — ABNORMAL HIGH (ref 1.005–1.030)
pH: 7 (ref 5.0–8.0)

## 2019-05-28 LAB — I-STAT CHEM 8, ED
BUN: 15 mg/dL (ref 8–23)
Calcium, Ion: 1.12 mmol/L — ABNORMAL LOW (ref 1.15–1.40)
Chloride: 109 mmol/L (ref 98–111)
Creatinine, Ser: 0.9 mg/dL (ref 0.61–1.24)
Glucose, Bld: 98 mg/dL (ref 70–99)
HCT: 36 % — ABNORMAL LOW (ref 39.0–52.0)
Hemoglobin: 12.2 g/dL — ABNORMAL LOW (ref 13.0–17.0)
Potassium: 3.8 mmol/L (ref 3.5–5.1)
Sodium: 142 mmol/L (ref 135–145)
TCO2: 21 mmol/L — ABNORMAL LOW (ref 22–32)

## 2019-05-28 LAB — COMPREHENSIVE METABOLIC PANEL
ALT: 28 U/L (ref 0–44)
AST: 48 U/L — ABNORMAL HIGH (ref 15–41)
Albumin: 2.9 g/dL — ABNORMAL LOW (ref 3.5–5.0)
Alkaline Phosphatase: 98 U/L (ref 38–126)
Anion gap: 5 (ref 5–15)
BUN: 16 mg/dL (ref 8–23)
CO2: 21 mmol/L — ABNORMAL LOW (ref 22–32)
Calcium: 8.1 mg/dL — ABNORMAL LOW (ref 8.9–10.3)
Chloride: 113 mmol/L — ABNORMAL HIGH (ref 98–111)
Creatinine, Ser: 0.93 mg/dL (ref 0.61–1.24)
GFR calc Af Amer: >60 mL/min (ref >60)
GFR calc non Af Amer: >60 mL/min (ref >60)
Glucose, Bld: 106 mg/dL — ABNORMAL HIGH (ref 70–99)
Potassium: 3.8 mmol/L (ref 3.5–5.1)
Sodium: 139 mmol/L (ref 135–145)
Total Bilirubin: 2.3 mg/dL — ABNORMAL HIGH (ref 0.3–1.2)
Total Protein: 5.5 g/dL — ABNORMAL LOW (ref 6.5–8.1)

## 2019-05-28 LAB — AMMONIA
Ammonia: 275 umol/L — ABNORMAL HIGH (ref 9–35)
Ammonia: 122 umol/L — ABNORMAL HIGH (ref 9–35)

## 2019-05-28 LAB — RAPID URINE DRUG SCREEN, HOSP PERFORMED
Amphetamines: NOT DETECTED
Barbiturates: NOT DETECTED
Benzodiazepines: NOT DETECTED
Cocaine: NOT DETECTED
Opiates: NOT DETECTED
Tetrahydrocannabinol: NOT DETECTED

## 2019-05-28 LAB — CBC
HCT: 36 % — ABNORMAL LOW (ref 39.0–52.0)
Hemoglobin: 12.2 g/dL — ABNORMAL LOW (ref 13.0–17.0)
MCH: 34.1 pg — ABNORMAL HIGH (ref 26.0–34.0)
MCHC: 33.9 g/dL (ref 30.0–36.0)
MCV: 100.6 fL — ABNORMAL HIGH (ref 80.0–100.0)
Platelets: 68 10*3/uL — ABNORMAL LOW (ref 150–400)
RBC: 3.58 MIL/uL — ABNORMAL LOW (ref 4.22–5.81)
RDW: 14.9 % (ref 11.5–15.5)
WBC: 2.8 10*3/uL — ABNORMAL LOW (ref 4.0–10.5)
nRBC: 0 % (ref 0.0–0.2)

## 2019-05-28 LAB — ETHANOL: Alcohol, Ethyl (B): <10 mg/dL (ref <10)

## 2019-05-28 LAB — RESPIRATORY PANEL BY RT PCR (FLU A&B, COVID)
Influenza A by PCR: NEGATIVE
Influenza B by PCR: NEGATIVE
SARS Coronavirus 2 by RT PCR: NEGATIVE

## 2019-05-28 LAB — CBG MONITORING, ED: Glucose-Capillary: 98 mg/dL (ref 70–99)

## 2019-05-28 LAB — APTT: aPTT: 42 seconds — ABNORMAL HIGH (ref 24–36)

## 2019-05-28 LAB — PROTIME-INR
INR: 1.7 — ABNORMAL HIGH (ref 0.8–1.2)
Prothrombin Time: 19.5 seconds — ABNORMAL HIGH (ref 11.4–15.2)

## 2019-05-28 LAB — ACETAMINOPHEN LEVEL: Acetaminophen (Tylenol), Serum: <10 ug/mL — ABNORMAL LOW (ref 10–30)

## 2019-05-28 MED ORDER — HEPARIN SODIUM (PORCINE) 5000 UNIT/ML IJ SOLN
5000.0000 [IU] | Freq: Three times a day (TID) | INTRAMUSCULAR | Status: DC
  Administered 2019-05-28 – 2019-05-31 (×10): 5000 [IU] via SUBCUTANEOUS
  Filled 2019-05-28 (×11): qty 1

## 2019-05-28 MED ORDER — SODIUM CHLORIDE 0.9 % IV SOLN: 75.0000 mL/h | INTRAVENOUS | Status: DC

## 2019-05-28 MED ORDER — IOHEXOL 350 MG/ML SOLN
150.0000 mL | Freq: Once | INTRAVENOUS | Status: AC | PRN
  Administered 2019-05-28: 150 mL via INTRAVENOUS

## 2019-05-28 MED ORDER — LEVETIRACETAM IN NACL 1500 MG/100ML IV SOLN
1500.0000 mg | Freq: Once | INTRAVENOUS | Status: AC
  Administered 2019-05-28: 15:00:00 1500 mg via INTRAVENOUS
  Filled 2019-05-28: qty 100

## 2019-05-28 MED ORDER — LORAZEPAM 2 MG/ML IJ SOLN
INTRAMUSCULAR | Status: AC
  Filled 2019-05-28: qty 1

## 2019-05-28 MED ORDER — HYDRALAZINE HCL 20 MG/ML IJ SOLN
5.0000 mg | INTRAMUSCULAR | Status: DC | PRN
  Administered 2019-05-28 – 2019-05-29 (×2): 5 mg via INTRAVENOUS
  Filled 2019-05-28 (×2): qty 1

## 2019-05-28 MED ORDER — ORAL CARE MOUTH RINSE
15.0000 mL | Freq: Two times a day (BID) | OROMUCOSAL | Status: DC
  Administered 2019-05-29 – 2019-05-31 (×7): 15 mL via OROMUCOSAL

## 2019-05-28 MED ORDER — LORAZEPAM 2 MG/ML IJ SOLN
1.0000 mg | Freq: Once | INTRAMUSCULAR | Status: AC
  Administered 2019-05-28: 15:00:00 1 mg via INTRAVENOUS

## 2019-05-28 MED ORDER — ACETAMINOPHEN 325 MG PO TABS: 650.0000 mg | ORAL_TABLET | Freq: Four times a day (QID) | ORAL | Status: DC | PRN

## 2019-05-28 MED ORDER — LACTULOSE ENEMA
300.0000 mL | Freq: Once | ORAL | Status: AC
  Administered 2019-05-28: 300 mL via RECTAL
  Filled 2019-05-28: qty 300

## 2019-05-28 MED ORDER — CHLORHEXIDINE GLUCONATE CLOTH 2 % EX PADS
6.0000 | MEDICATED_PAD | Freq: Every day | CUTANEOUS | Status: DC
  Administered 2019-05-29 – 2019-05-31 (×3): 6 via TOPICAL

## 2019-05-28 MED ORDER — TRAZODONE HCL 50 MG PO TABS: 100.0000 mg | ORAL_TABLET | Freq: Every evening | ORAL | Status: DC | PRN

## 2019-05-28 MED ORDER — ACETAMINOPHEN 650 MG RE SUPP: 650.0000 mg | Freq: Four times a day (QID) | RECTAL | Status: DC | PRN

## 2019-05-28 MED ORDER — SODIUM CHLORIDE 0.9 % IV SOLN: INTRAVENOUS | Status: DC

## 2019-05-28 MED ORDER — LACTULOSE ENEMA
300.0000 mL | Freq: Two times a day (BID) | ORAL | Status: DC
  Filled 2019-05-28 (×7): qty 300

## 2019-05-28 MED ORDER — LORAZEPAM 2 MG/ML IJ SOLN
0.5000 mg | Freq: Once | INTRAMUSCULAR | Status: AC
  Administered 2019-05-28: 0.5 mg via INTRAVENOUS

## 2019-05-28 MED ORDER — SODIUM CHLORIDE 0.9 % IV SOLN: Freq: Once | INTRAVENOUS | Status: AC

## 2019-05-28 MED ORDER — LORAZEPAM 2 MG/ML IJ SOLN: 1.0000 mg | INTRAMUSCULAR | Status: DC | PRN

## 2019-05-28 NOTE — ED Notes (Signed)
ED TO INPATIENT HANDOFF REPORT  ED Nurse Name and Phone #: [phone]  S Name/Age/Gender Cody Austin 69 y.o. male Room/Bed: APA14/APA14  Code Status   Code Status: Not on file  Home/SNF/Other Home  Is this baseline? Yes   Triage Complete: Triage complete  Chief Complaint Acute encephalopathy [G93.40]  Triage Note Pt brought to ED via [facility] for slurred speech, unable to follow commands, LKW 0930. Dr Wyvonnia Dusky to evaluate pt.     Allergies No Known Allergies  Level of Care/Admitting Diagnosis ED Disposition    ED Disposition Condition [facility]: [facility] [[id number]]  Level of Care: Stepdown [14]  Covid Evaluation: Asymptomatic Screening Protocol (No Symptoms)  Diagnosis: Acute encephalopathy [638466]  Admitting Physician: Waldemar Dickens [phone]  Attending Physician: Marily Memos, DAVID J [[id number]]  Estimated length of stay: past midnight tomorrow  Certification:: I certify this patient will need inpatient services for at least 2 midnights       B Medical/Surgery History Past Medical History:  Diagnosis Date  . Arthritis   . Chronic kidney disease    left renal stone  . Diabetes mellitus without complication ([place])    Pre-Diabetes per MD  . Fatty liver   . Heart murmur   . Other pancytopenia ([place]) 11/19/2014  . Sleep apnea    uses CPAP   Past Surgical History:  Procedure Laterality Date  . CATARACT EXTRACTION W/PHACO Right 08/12/2014   Procedure: CATARACT EXTRACTION PHACO AND INTRAOCULAR LENS PLACEMENT (IOC);  Surgeon: [name], MD;  Location: [facility];  Service: Ophthalmology;  Laterality: Right;  CDE 9.32  . CATARACT EXTRACTION W/PHACO Left 08/26/2014   Procedure: CATARACT EXTRACTION PHACO AND INTRAOCULAR LENS PLACEMENT (IOC);  Surgeon: [name], MD;  Location: [facility];  Service: Ophthalmology;  Laterality: Left;  CDE:  9.49  . COLONOSCOPY N/A 12/13/2014   Procedure: COLONOSCOPY;  Surgeon: Rogene Houston, MD;  Location: [facility];   Service: Endoscopy;  Laterality: N/A;  100  . ESOPHAGOGASTRODUODENOSCOPY N/A 12/13/2014   Procedure: ESOPHAGOGASTRODUODENOSCOPY (EGD);  Surgeon: Rogene Houston, MD;  Location: [facility];  Service: Endoscopy;  Laterality: N/A;  . HERNIA REPAIR Right    Inguinal hernia     A IV Location/Drains/Wounds Patient Lines/Drains/Airways Status   Active Line/Drains/Airways    Name:   Placement date:   Placement time:   Site:   Days:   Peripheral IV 05/28/19 Left Antecubital   05/28/19    1449    Antecubital   less than 1   Peripheral IV 05/28/19 Right Forearm   05/28/19    1452    Forearm   less than 1   Peripheral IV 05/28/19 Left Forearm   05/28/19    1516    Forearm   less than 1   Rectal Tube/Pouch   05/28/19    1743    --   less than 1   External Urinary Catheter   05/28/19    1545    --   less than 1   Incision (Closed) 08/12/14 Eye Right   08/12/14    1125     1750   Incision (Closed) 08/26/14 Eye Left   08/26/14    1223     1736          Intake/Output Last 24 hours  Intake/Output Summary (Last 24 hours) at 05/28/2019 2053 Last data filed at 05/28/2019 1815 Gross per 24 hour  Intake 400.37 ml  Output 725  ml  Net -324.63 ml    Labs/Imaging Results for orders placed or performed during the hospital encounter of 05/28/19 (from the past 48 hour(s))  Protime-INR     Status: Abnormal   Collection Time: 05/28/19  2:38 PM  Result Value Ref Range   Prothrombin Time 19.5 (H) 11.4 - 15.2 seconds   INR 1.7 (H) 0.8 - 1.2    Comment: (NOTE) INR goal varies based on device and disease states. Performed at [facility], [address], [place], [place] [address]   APTT     Status: Abnormal   Collection Time: 05/28/19  2:38 PM  Result Value Ref Range   aPTT 42 (H) 24 - 36 seconds    Comment:        IF BASELINE aPTT IS ELEVATED, SUGGEST PATIENT RISK ASSESSMENT BE USED TO DETERMINE APPROPRIATE ANTICOAGULANT THERAPY. Performed at [facility], [address] [place], [place] [address]    CBC     Status: Abnormal   Collection Time: 05/28/19  2:38 PM  Result Value Ref Range   WBC 2.8 (L) 4.0 - 10.5 K/uL   RBC 3.58 (L) 4.22 - 5.81 MIL/uL   Hemoglobin 12.2 (L) 13.0 - 17.0 g/dL   HCT 36.0 (L) 39.0 - 52.0 %   MCV 100.6 (H) 80.0 - 100.0 fL   MCH 34.1 (H) 26.0 - 34.0 pg   MCHC 33.9 30.0 - 36.0 g/dL   RDW 14.9 11.5 - 15.5 %   Platelets 68 (L) 150 - 400 K/uL    Comment: PLATELET COUNT CONFIRMED BY SMEAR SPECIMEN CHECKED FOR CLOTS    nRBC 0.0 0.0 - 0.2 %    Comment: Performed at [facility], [address] [place], [place] [address]  Differential     Status: Abnormal   Collection Time: 05/28/19  2:38 PM  Result Value Ref Range   Neutrophils Relative % 58 %   Neutro Abs 1.6 (L) 1.7 - 7.7 K/uL   Lymphocytes Relative 24 %   Lymphs Abs 0.7 0.7 - 4.0 K/uL   Monocytes Relative 14 %   Monocytes Absolute 0.4 0.1 - 1.0 K/uL   Eosinophils Relative 3 %   Eosinophils Absolute 0.1 0.0 - 0.5 K/uL   Basophils Relative 1 %   Basophils Absolute 0.0 0.0 - 0.1 K/uL   Immature Granulocytes 0 %   Abs Immature Granulocytes 0.01 0.00 - 0.07 K/uL    Comment: Performed at [facility], [address] [place], [place] [address]  Comprehensive metabolic panel     Status: Abnormal   Collection Time: 05/28/19  2:38 PM  Result Value Ref Range   Sodium 139 135 - 145 mmol/L   Potassium 3.8 3.5 - 5.1 mmol/L   Chloride 113 (H) 98 - 111 mmol/L   CO2 21 (L) 22 - 32 mmol/L   Glucose, Bld 106 (H) 70 - 99 mg/dL   BUN 16 8 - 23 mg/dL   Creatinine, Ser 0.93 0.61 - 1.24 mg/dL   Calcium 8.1 (L) 8.9 - 10.3 mg/dL   Total Protein 5.5 (L) 6.5 - 8.1 g/dL   Albumin 2.9 (L) 3.5 - 5.0 g/dL   AST 48 (H) 15 - 41 U/L   ALT 28 0 - 44 U/L   Alkaline Phosphatase 98 38 - 126 U/L   Total Bilirubin 2.3 (H) 0.3 - 1.2 mg/dL   GFR calc non Af Amer >60 >60 mL/min   GFR calc Af Amer >60 >60 mL/min   Anion gap 5 5 -  15    Comment: Performed at [facility], [address], [place], [place] [address]  Acetaminophen level      Status: Abnormal   Collection Time: 05/28/19  2:38 PM  Result Value Ref Range   Acetaminophen (Tylenol), Serum <10 (L) 10 - 30 ug/mL    Comment: (NOTE) Therapeutic concentrations vary significantly. A range of 10-30 ug/mL  may be an effective concentration for many patients. However, some  are best treated at concentrations outside of this range. Acetaminophen concentrations >150 ug/mL at 4 hours after ingestion  and >50 ug/mL at 12 hours after ingestion are often associated with  toxic reactions. Performed at [facility], [address], [place], [place] [address]   Respiratory Panel by RT PCR (Flu A&B, Covid) - Nasopharyngeal Swab     Status: None   Collection Time: 05/28/19  2:51 PM   Specimen: Nasopharyngeal Swab  Result Value Ref Range   SARS Coronavirus 2 by RT PCR NEGATIVE NEGATIVE    Comment: (NOTE) SARS-CoV-2 target nucleic acids are NOT DETECTED. The SARS-CoV-2 RNA is generally detectable in upper respiratoy specimens during the acute phase of infection. The lowest concentration of SARS-CoV-2 viral copies this assay can detect is 131 copies/mL. A negative result does not preclude SARS-Cov-2 infection and should not be used as the sole basis for treatment or other patient management decisions. A negative result may occur with  improper specimen collection/handling, submission of specimen other than nasopharyngeal swab, presence of viral mutation(s) within the areas targeted by this assay, and inadequate number of viral copies (<131 copies/mL). A negative result must be combined with clinical observations, patient history, and epidemiological information. The expected result is Negative. Fact Sheet for Patients:  PinkCheek.be Fact Sheet for Healthcare Providers:  GravelBags.it This test is not yet ap proved or cleared by the [place] FDA and  has been authorized for detection and/or diagnosis of SARS-CoV-2  by FDA under an Emergency Use Authorization (EUA). This EUA will remain  in effect (meaning this test can be used) for the duration of the COVID-19 declaration under Section 564(b)(1) of the Act, 21 U.S.C. section 360bbb-3(b)(1), unless the authorization is terminated or revoked sooner.    Influenza A by PCR NEGATIVE NEGATIVE   Influenza B by PCR NEGATIVE NEGATIVE    Comment: (NOTE) The Xpert Xpress SARS-CoV-2/FLU/RSV assay is intended as an aid in  the diagnosis of influenza from Nasopharyngeal swab specimens and  should not be used as a sole basis for treatment. Nasal washings and  aspirates are unacceptable for Xpert Xpress SARS-CoV-2/FLU/RSV  testing. Fact Sheet for Patients: PinkCheek.be Fact Sheet for Healthcare Providers: GravelBags.it This test is not yet approved or cleared by the [place] FDA and  has been authorized for detection and/or diagnosis of SARS-CoV-2 by  FDA under an Emergency Use Authorization (EUA). This EUA will remain  in effect (meaning this test can be used) for the duration of the  Covid-19 declaration under Section 564(b)(1) of the Act, 21  U.S.C. section 360bbb-3(b)(1), unless the authorization is  terminated or revoked. Performed at [facility], [address] [place], [place] [address]   CBG monitoring, ED     Status: None   Collection Time: 05/28/19  3:10 PM  Result Value Ref Range   Glucose-Capillary 98 70 - 99 mg/dL  Ammonia     Status: Abnormal   Collection Time: 05/28/19  3:15 PM  Result Value Ref Range   Ammonia 275 (H) 9 - 35 umol/L  Comment: Performed at [facility], [address] [place], [place] [address]  Ethanol     Status: None   Collection Time: 05/28/19  3:15 PM  Result Value Ref Range   Alcohol, Ethyl (B) <10 <10 mg/dL    Comment: (NOTE) Lowest detectable limit for serum alcohol is 10 mg/dL. For medical purposes only. Performed at [facility], [address], [place], [place] [address]   Ginger Carne 8, ED     Status: Abnormal   Collection Time: 05/28/19  3:49 PM  Result Value Ref Range   Sodium 142 135 - 145 mmol/L   Potassium 3.8 3.5 - 5.1 mmol/L   Chloride 109 98 - 111 mmol/L   BUN 15 8 - 23 mg/dL   Creatinine, Ser 0.90 0.61 - 1.24 mg/dL   Glucose, Bld 98 70 - 99 mg/dL   Calcium, Ion 1.12 (L) 1.15 - 1.40 mmol/L   TCO2 21 (L) 22 - 32 mmol/L   Hemoglobin 12.2 (L) 13.0 - 17.0 g/dL   HCT 36.0 (L) 39.0 - 52.0 %  Urine rapid drug screen (hosp performed)     Status: None   Collection Time: 05/28/19  5:59 PM  Result Value Ref Range   Opiates NONE DETECTED NONE DETECTED   Cocaine NONE DETECTED NONE DETECTED   Benzodiazepines NONE DETECTED NONE DETECTED   Amphetamines NONE DETECTED NONE DETECTED   Tetrahydrocannabinol NONE DETECTED NONE DETECTED   Barbiturates NONE DETECTED NONE DETECTED    Comment: (NOTE) DRUG SCREEN FOR MEDICAL PURPOSES ONLY.  IF CONFIRMATION IS NEEDED FOR ANY PURPOSE, NOTIFY LAB WITHIN 5 DAYS. LOWEST DETECTABLE LIMITS FOR URINE DRUG SCREEN Drug Class                     Cutoff (ng/mL) Amphetamine and metabolites    1000 Barbiturate and metabolites    200 Benzodiazepine                 086 Tricyclics and metabolites     300 Opiates and metabolites        300 Cocaine and metabolites        300 THC                            50 Performed at [facility], [address], [place], [place] [address]   Urinalysis, Routine w reflex microscopic     Status: Abnormal   Collection Time: 05/28/19  5:59 PM  Result Value Ref Range   Color, Urine YELLOW YELLOW   APPearance HAZY (A) CLEAR   Specific Gravity, Urine >1.046 (H) 1.005 - 1.030   pH 7.0 5.0 - 8.0   Glucose, UA NEGATIVE NEGATIVE mg/dL   Hgb urine dipstick NEGATIVE NEGATIVE   Bilirubin Urine NEGATIVE NEGATIVE   Ketones, ur NEGATIVE NEGATIVE mg/dL   Protein, ur NEGATIVE NEGATIVE mg/dL   Nitrite NEGATIVE NEGATIVE   Leukocytes,Ua NEGATIVE NEGATIVE    Comment:  Performed at [facility], [address] [place], [place] [address]   CT Angio Head W or Wo Contrast  Result Date: 05/28/2019 CLINICAL DATA:  Right-sided weakness. Altered mental status. EXAM: CT ANGIOGRAPHY HEAD AND NECK CT PERFUSION BRAIN TECHNIQUE: Multidetector CT imaging of the head and neck was performed using the standard protocol during bolus administration of intravenous contrast. Multiplanar CT image reconstructions and MIPs were obtained to evaluate the vascular anatomy. Carotid stenosis measurements (when applicable) are obtained utilizing NASCET criteria, using the distal  internal carotid diameter as the denominator. Multiphase CT imaging of the brain was performed following IV bolus contrast injection. Subsequent parametric perfusion maps were calculated using RAPID software. CONTRAST:  116m OMNIPAQUE IOHEXOL 350 MG/ML SOLN COMPARISON:  None. FINDINGS: CTA NECK FINDINGS Aortic arch: Normal variant aortic arch branching pattern with common origin of the brachiocephalic and left common carotid arteries. Mild aortic atherosclerosis without significant arch vessel origin stenosis. Right carotid system: Patent with mild calcified plaque at the carotid bifurcation. No evidence of significant stenosis or dissection. Tortuous mid cervical ICA. Left carotid system: Patent with mild-to-moderate calcified plaque in the carotid bulb and minimal plaque in the distal cervical ICA. No evidence of significant stenosis or dissection. Tortuous distal cervical ICA. Vertebral arteries: Patent and codominant with moderate to severe stenosis near the left V3-V4 junction. Skeleton: Cervical spondylosis with uncovertebral spurring resulting in advanced neural foraminal stenosis on the right at C5-6 and bilaterally at C6-7. At least mild spinal stenosis at C6-7 due to a broad-based posterior disc osteophyte complex. Other neck: No evidence of cervical lymphadenopathy or mass. Upper chest: Clear lung apices. Review of the  MIP images confirms the above findings CTA HEAD FINDINGS Anterior circulation: The internal carotid arteries are patent from skull base to carotid termini with calcified plaque resulting in moderate left greater than right cavernous and mild-to-moderate left paraclinoid stenoses. ACAs and MCAs are patent without evidence of proximal branch occlusion or flow limiting proximal stenosis. There is mild right A1 segment irregular narrowing versus artifact. No aneurysm is identified. Posterior circulation: The intracranial vertebral arteries are patent to the basilar with atherosclerosis bilaterally resulting in severe proximal V4 stenosis on the left. The right PICA and left AICA appear dominant. Patent SCA origins are seen bilaterally. The basilar artery is patent and congenitally small in caliber diffusely. There are large posterior communicating arteries with hypoplastic P1 segments bilaterally. Both PCAs are patent without evidence of flow limiting proximal stenosis. No aneurysm is identified. Venous sinuses: Patent. Anatomic variants: Predominantly fetal type origin of the PCAs. Review of the MIP images confirms the above findings CT Brain Perfusion Findings: ASPECTS: 10 CBF (<30%) Volume: 0 mL Perfusion (Tmax>6.0s) volume: 81 mL, primarily localizing to the superior cerebellum, brainstem, and right greater than left temporo-occipital regions and felt to reflect artifact Mismatch Volume: Not applicable given suspected artifact described above Infarction Location: Not applicable IMPRESSION: 1. No large vessel occlusion. 2. Intracranial atherosclerosis with moderate bilateral ICA and severe left V4 stenoses. 3. Mild cervical carotid artery atherosclerosis without stenosis. 4. No core infarct or definite abnormal perfusion identified on CTP. 5.  Aortic Atherosclerosis (ICD10-I70.0). The preliminary finding of no LVO was communicated via telephone to Dr. SEzequiel Essexcore on 05/28/2019 at 2:58 pm. Electronically  Signed   By: ALogan BoresM.D.   On: 05/28/2019 15:46   CT Angio Neck W and/or Wo Contrast  Result Date: 05/28/2019 CLINICAL DATA:  Right-sided weakness. Altered mental status. EXAM: CT ANGIOGRAPHY HEAD AND NECK CT PERFUSION BRAIN TECHNIQUE: Multidetector CT imaging of the head and neck was performed using the standard protocol during bolus administration of intravenous contrast. Multiplanar CT image reconstructions and MIPs were obtained to evaluate the vascular anatomy. Carotid stenosis measurements (when applicable) are obtained utilizing NASCET criteria, using the distal internal carotid diameter as the denominator. Multiphase CT imaging of the brain was performed following IV bolus contrast injection. Subsequent parametric perfusion maps were calculated using RAPID software. CONTRAST:  1568mOMNIPAQUE IOHEXOL 350 MG/ML SOLN COMPARISON:  None. FINDINGS:  CTA NECK FINDINGS Aortic arch: Normal variant aortic arch branching pattern with common origin of the brachiocephalic and left common carotid arteries. Mild aortic atherosclerosis without significant arch vessel origin stenosis. Right carotid system: Patent with mild calcified plaque at the carotid bifurcation. No evidence of significant stenosis or dissection. Tortuous mid cervical ICA. Left carotid system: Patent with mild-to-moderate calcified plaque in the carotid bulb and minimal plaque in the distal cervical ICA. No evidence of significant stenosis or dissection. Tortuous distal cervical ICA. Vertebral arteries: Patent and codominant with moderate to severe stenosis near the left V3-V4 junction. Skeleton: Cervical spondylosis with uncovertebral spurring resulting in advanced neural foraminal stenosis on the right at C5-6 and bilaterally at C6-7. At least mild spinal stenosis at C6-7 due to a broad-based posterior disc osteophyte complex. Other neck: No evidence of cervical lymphadenopathy or mass. Upper chest: Clear lung apices. Review of the MIP images  confirms the above findings CTA HEAD FINDINGS Anterior circulation: The internal carotid arteries are patent from skull base to carotid termini with calcified plaque resulting in moderate left greater than right cavernous and mild-to-moderate left paraclinoid stenoses. ACAs and MCAs are patent without evidence of proximal branch occlusion or flow limiting proximal stenosis. There is mild right A1 segment irregular narrowing versus artifact. No aneurysm is identified. Posterior circulation: The intracranial vertebral arteries are patent to the basilar with atherosclerosis bilaterally resulting in severe proximal V4 stenosis on the left. The right PICA and left AICA appear dominant. Patent SCA origins are seen bilaterally. The basilar artery is patent and congenitally small in caliber diffusely. There are large posterior communicating arteries with hypoplastic P1 segments bilaterally. Both PCAs are patent without evidence of flow limiting proximal stenosis. No aneurysm is identified. Venous sinuses: Patent. Anatomic variants: Predominantly fetal type origin of the PCAs. Review of the MIP images confirms the above findings CT Brain Perfusion Findings: ASPECTS: 10 CBF (<30%) Volume: 0 mL Perfusion (Tmax>6.0s) volume: 81 mL, primarily localizing to the superior cerebellum, brainstem, and right greater than left temporo-occipital regions and felt to reflect artifact Mismatch Volume: Not applicable given suspected artifact described above Infarction Location: Not applicable IMPRESSION: 1. No large vessel occlusion. 2. Intracranial atherosclerosis with moderate bilateral ICA and severe left V4 stenoses. 3. Mild cervical carotid artery atherosclerosis without stenosis. 4. No core infarct or definite abnormal perfusion identified on CTP. 5.  Aortic Atherosclerosis (ICD10-I70.0). The preliminary finding of no LVO was communicated via telephone to Dr. Ezequiel Essex core on 05/28/2019 at 2:58 pm. Electronically Signed   By:  Logan Bores M.D.   On: 05/28/2019 15:46   CT CEREBRAL PERFUSION W CONTRAST  Result Date: 05/28/2019 CLINICAL DATA:  Right-sided weakness. Altered mental status. EXAM: CT ANGIOGRAPHY HEAD AND NECK CT PERFUSION BRAIN TECHNIQUE: Multidetector CT imaging of the head and neck was performed using the standard protocol during bolus administration of intravenous contrast. Multiplanar CT image reconstructions and MIPs were obtained to evaluate the vascular anatomy. Carotid stenosis measurements (when applicable) are obtained utilizing NASCET criteria, using the distal internal carotid diameter as the denominator. Multiphase CT imaging of the brain was performed following IV bolus contrast injection. Subsequent parametric perfusion maps were calculated using RAPID software. CONTRAST:  167m OMNIPAQUE IOHEXOL 350 MG/ML SOLN COMPARISON:  None. FINDINGS: CTA NECK FINDINGS Aortic arch: Normal variant aortic arch branching pattern with common origin of the brachiocephalic and left common carotid arteries. Mild aortic atherosclerosis without significant arch vessel origin stenosis. Right carotid system: Patent with mild calcified plaque at the carotid  bifurcation. No evidence of significant stenosis or dissection. Tortuous mid cervical ICA. Left carotid system: Patent with mild-to-moderate calcified plaque in the carotid bulb and minimal plaque in the distal cervical ICA. No evidence of significant stenosis or dissection. Tortuous distal cervical ICA. Vertebral arteries: Patent and codominant with moderate to severe stenosis near the left V3-V4 junction. Skeleton: Cervical spondylosis with uncovertebral spurring resulting in advanced neural foraminal stenosis on the right at C5-6 and bilaterally at C6-7. At least mild spinal stenosis at C6-7 due to a broad-based posterior disc osteophyte complex. Other neck: No evidence of cervical lymphadenopathy or mass. Upper chest: Clear lung apices. Review of the MIP images confirms the  above findings CTA HEAD FINDINGS Anterior circulation: The internal carotid arteries are patent from skull base to carotid termini with calcified plaque resulting in moderate left greater than right cavernous and mild-to-moderate left paraclinoid stenoses. ACAs and MCAs are patent without evidence of proximal branch occlusion or flow limiting proximal stenosis. There is mild right A1 segment irregular narrowing versus artifact. No aneurysm is identified. Posterior circulation: The intracranial vertebral arteries are patent to the basilar with atherosclerosis bilaterally resulting in severe proximal V4 stenosis on the left. The right PICA and left AICA appear dominant. Patent SCA origins are seen bilaterally. The basilar artery is patent and congenitally small in caliber diffusely. There are large posterior communicating arteries with hypoplastic P1 segments bilaterally. Both PCAs are patent without evidence of flow limiting proximal stenosis. No aneurysm is identified. Venous sinuses: Patent. Anatomic variants: Predominantly fetal type origin of the PCAs. Review of the MIP images confirms the above findings CT Brain Perfusion Findings: ASPECTS: 10 CBF (<30%) Volume: 0 mL Perfusion (Tmax>6.0s) volume: 81 mL, primarily localizing to the superior cerebellum, brainstem, and right greater than left temporo-occipital regions and felt to reflect artifact Mismatch Volume: Not applicable given suspected artifact described above Infarction Location: Not applicable IMPRESSION: 1. No large vessel occlusion. 2. Intracranial atherosclerosis with moderate bilateral ICA and severe left V4 stenoses. 3. Mild cervical carotid artery atherosclerosis without stenosis. 4. No core infarct or definite abnormal perfusion identified on CTP. 5.  Aortic Atherosclerosis (ICD10-I70.0). The preliminary finding of no LVO was communicated via telephone to Dr. Ezequiel Essex core on 05/28/2019 at 2:58 pm. Electronically Signed   By: Logan Bores  M.D.   On: 05/28/2019 15:46   CT HEAD CODE STROKE WO CONTRAST  Result Date: 05/28/2019 CLINICAL DATA:  Code stroke. Right-sided weakness. Altered mental status. EXAM: CT HEAD WITHOUT CONTRAST TECHNIQUE: Contiguous axial images were obtained from the base of the skull through the vertex without intravenous contrast. COMPARISON:  None. FINDINGS: Brain: The study is mildly motion degraded despite repeat imaging. Within this limitation, no acute infarct, intracranial hemorrhage, mass, midline shift, or extra-axial fluid collection is identified. The ventricles and sulci are within normal limits for age. Hypodensities in the cerebral white matter bilaterally are nonspecific but compatible with mild chronic small vessel ischemic disease. Vascular: Calcified atherosclerosis at the skull base. No hyperdense vessel. Skull: No fracture or suspicious osseous lesion. Sinuses/Orbits: Visualized paranasal sinuses and mastoid air cells are clear. Bilateral cataract extraction is noted. Other: None. ASPECTS [facility] Stroke Program Early CT Score) - Ganglionic level infarction (caudate, lentiform nuclei, internal capsule, insula, M1-M3 cortex): 7 - Supraganglionic infarction (M4-M6 cortex): 3 Total score (0-10 with 10 being normal): 10 IMPRESSION: 1. No evidence of acute intracranial abnormality. 2. ASPECTS is 10. 3. Mild chronic small vessel ischemic disease. These results were called by telephone at the  time of interpretation on 05/28/2019 at 2:58 pm to Dr. Ezequiel Essex , who verbally acknowledged these results. Electronically Signed   By: Logan Bores M.D.   On: 05/28/2019 15:04    Pending Labs Unresulted Labs (From admission, onward)   None      Vitals/Pain Today's Vitals   05/28/19 1945 05/28/19 2000 05/28/19 2030 05/28/19 2045  BP: (!) 158/62 (!) 141/67 (!) 149/73 (!) 150/67  Pulse: (!) 104 (!) 107    Resp: (!) 21 (!) 21 19 19   Temp:      TempSrc:      SpO2: 100% 99%    Weight:      Height:      PainSc:         Isolation Precautions No active isolations  Medications Medications  iohexol (OMNIPAQUE) 350 MG/ML injection 150 mL (150 mLs Intravenous Contrast Given 05/28/19 1452)  levETIRAcetam (KEPPRA) IVPB 1500 mg/ 100 mL premix (0 mg Intravenous Stopped 05/28/19 1542)  LORazepam (ATIVAN) injection 1 mg (1 mg Intravenous Given 05/28/19 1513)  lactulose (CHRONULAC) enema 200 gm (300 mLs Rectal Given 05/28/19 1738)  0.9 %  sodium chloride infusion ( Intravenous New Bag/Given 05/28/19 1756)  LORazepam (ATIVAN) injection 0.5 mg (0.5 mg Intravenous Given 05/28/19 1853)    Mobility walks High fall risk   Focused Assessments Neuro Assessment Handoff:  Swallow screen pass? No    NIH Stroke Scale ( + Modified Stroke Scale Criteria)  Interval: Initial LOC Questions (1b. )   +: Answers neither question correctly LOC Commands (1c. )   + : Performs one task correctly Best Gaze (2. )  +: Normal Visual (3. )  +: No visual loss Motor Arm, Left (5a. )   +: Drift Motor Arm, Right (5b. )   +: Some effort against gravity Motor Leg, Left (6a. )   +: No effort against gravity Motor Leg, Right (6b. )   +: No effort against gravity Sensory (8. )   +: Normal, no sensory loss Best Language (9. )   +: Severe aphasia Extinction/Inattention (11.)   +: No Abnormality Modified SS Total  +: 14 Last date known well: 05/28/19 Last time known well: 0930 Neuro Assessment:   Neuro Checks:   Initial (05/28/19 1435)  Last Documented NIHSS Modified Score: 14 (05/28/19 1914) Has TPA been given? No If patient is a Neuro Trauma and patient is going to OR before floor call report to [place] nurse: [phone] or [phone]     R Recommendations: See Admitting Provider Note  Report given to:   Additional Notes:

## 2019-05-28 NOTE — Consult Note (Addendum)
[facility] Consult Services   Date of Service:   05/28/2019 14:38:39  Impression:     .  I63.9 - Cerebrovascular accident (CVA), unspecified mechanism ([place])  Comments/Sign-Out: acute onset difficulty speaking/following commands, along with mild right arm drift and intermittent L sided convulsions - concerning for L frontotemporal stroke vs partial seizure vs metabolic encephalopathy. He is not a tpa candidate due to LSN >4.5 hours. CTA/P head/neck pending. Recommend loading with keppra 1500 mg iv x 1 now.  Metrics: Last Known Well: 05/28/2019 09:00:00 [facility] Notification Time: 05/28/2019 14:37:54 Arrival Time: 05/28/2019 14:29:00 Stamp Time: 05/28/2019 14:38:39 Time First Login Attempt: 05/28/2019 14:46:32 Symptoms: difficulty speaking/confusion. NIHSS Start Assessment Time: 05/28/2019 15:08:00 Patient is not a candidate for Alteplase/Activase. Alteplase Medical Decision: 05/28/2019 14:53:14 Patient was not deemed candidate for Alteplase/Activase thrombolytics because of Last Well Known Above 4.5 Hours.  CT head showed no acute hemorrhage or acute core infarct. CT head was reviewed.  Lower Likelihood of Large Vessel Occlusion but Following Stat Studies are Recommended  CTA Head and Neck. CTA/P head/neck negative for LVO; no role for thrombectomy.  ED Physician notified of diagnostic impression and management plan on 05/28/2019 15:11:53  Our recommendations are outlined below.  Recommendations:     .  Activate Stroke Protocol Admission/Order Set     .  Stroke/Telemetry Floor     .  Neuro Checks     .  Bedside Swallow Eval     .  DVT Prophylaxis     .  IV Fluids, Normal Saline     .  Head of Bed 30 Degrees     .  Euglycemia and Avoid Hyperthermia (PRN Acetaminophen)     .  start ASA if CT head is neg for hemorrhage .  Routine Consultation with [facility] for Follow up Care  Sign Out:     .  Discussed with Emergency  Department Provider    ------------------------------------------------------------------------------  History of Present Illness: Patient is a 69 year old Male.  Patient was brought by EMS for symptoms of difficulty speaking/confusion.  69 yo man with acute onset slurred speech and inability to follow commands. He was last seen normal by family at approximately 0930 this am. He has a hx of cirrhosis, OSA, DM, pancytopenia. He takes no blood thinners. He was noted by the ED physician to have right arm drift and intermittent L sided convulsion.    Past Medical History:     . Diabetes Mellitus     . Hyperlipidemia  Anticoagulant use:  No  Antiplatelet use: No     Examination: BP(142/113), Pulse(89), Blood Glucose(130) 1A: Level of Consciousness - Requires repeated stimulation to arouse + 2 1B: Ask Month and Age - Could Not Answer Either Question Correctly + 2 1C: Blink Eyes & Squeeze Hands - Performs Both Tasks + 0 2: Test Horizontal Extraocular Movements - Normal + 0 3: Test Visual Fields - No Visual Loss + 0 4: Test Facial Palsy (Use Grimace if Obtunded) - Normal symmetry + 0 5A: Test Left Arm Motor Drift - Drift, but doesn't hit bed + 1 5B: Test Right Arm Motor Drift - Drift, hits bed + 2 6A: Test Left Leg Motor Drift - No Effort Against Gravity + 3 6B: Test Right Leg Motor Drift - No Effort Against Gravity + 3 7: Test Limb Ataxia (FNF/Heel-Shin) - No Ataxia + 0 8: Test Sensation - Normal; No sensory loss + 0 9: Test Language/Aphasia - Severe Aphasia: Fragmentary Expression, Inference  Needed, Cannot Identify Materials + 2 10: Test Dysarthria - Normal + 0 11: Test Extinction/Inattention - No abnormality + 0  NIHSS Score: 15   Patient/Family was informed the Neurology Consult would occur via TeleHealth consult by way of interactive audio and video telecommunications and consented to receiving care in this manner.   Due to the immediate potential for life-threatening  deterioration due to underlying acute neurologic illness, I spent 20 minutes providing critical care. This time includes time for face to face visit via telemedicine, review of medical records, imaging studies and discussion of findings with providers, the patient and/or family.   Dr Hal Morales   [facility] [phone]  Case [id number]

## 2019-05-28 NOTE — H&P (Signed)
History and Physical    Cody Austin SJG:[id number] DOB: 1950/04/24 DOA: 05/28/2019  PCP: [name], MD Patient coming from: home  Chief Complaint: AMS  Level 5 caveat. Unable to provide history. History provided to pts wife.   HPI: Cody Austin is a 69 y.o. male with medical history significant of CKD, Cirrhosis, DM, OSA, pancytopenia.   Pt was last reported nml at 09:00 this am. Shortly after that time. Pt began to shuffle his feet, appear unstable and slur his speech. Pt also came into the living room without pants attempting to grab his foot. EMS was called and pt was in his  Normal state of health at time of arrival. Per pt wife in the weeks leading up to admission he was experiencing a worsening tremor. Unsure of how many BMs he was having. States pt had COVID back in October and had never been fully well since that time. No other complaints including fever, n/v/abd pain, dysuria.   ED Course: Objective findings outlined below.  Review of Systems: As per HPI otherwise all other systems reviewed and are negative  Ambulatory Status:No restrictions  Past Medical History:  Diagnosis Date  . Arthritis   . Chronic kidney disease    left renal stone  . Diabetes mellitus without complication ([place])    Pre-Diabetes per MD  . Fatty liver   . Heart murmur   . Other pancytopenia ([place]) 11/19/2014  . Peripheral arterial disease ([place]) 05/28/2019  . Sleep apnea    uses CPAP    Past Surgical History:  Procedure Laterality Date  . CATARACT EXTRACTION W/PHACO Right 08/12/2014   Procedure: CATARACT EXTRACTION PHACO AND INTRAOCULAR LENS PLACEMENT (IOC);  Surgeon: [name], MD;  Location: [facility];  Service: Ophthalmology;  Laterality: Right;  CDE 9.32  . CATARACT EXTRACTION W/PHACO Left 08/26/2014   Procedure: CATARACT EXTRACTION PHACO AND INTRAOCULAR LENS PLACEMENT (IOC);  Surgeon: [name], MD;  Location: [facility];  Service: Ophthalmology;  Laterality: Left;  CDE:  9.49  . COLONOSCOPY N/A  12/13/2014   Procedure: COLONOSCOPY;  Surgeon: Rogene Houston, MD;  Location: [facility];  Service: Endoscopy;  Laterality: N/A;  100  . ESOPHAGOGASTRODUODENOSCOPY N/A 12/13/2014   Procedure: ESOPHAGOGASTRODUODENOSCOPY (EGD);  Surgeon: Rogene Houston, MD;  Location: [facility];  Service: Endoscopy;  Laterality: N/A;  . HERNIA REPAIR Right    Inguinal hernia    Social History   Socioeconomic History  . Marital status: Married    Spouse name: Not on file  . Number of children: Not on file  . Years of education: Not on file  . Highest education level: Not on file  Occupational History  . Not on file  Tobacco Use  . Smoking status: Never Smoker  . Smokeless tobacco: Never Used  Substance and Sexual Activity  . Alcohol use: Yes    Alcohol/week: 0.0 standard drinks    Comment: occ  . Drug use: No  . Sexual activity: Not on file  Other Topics Concern  . Not on file  Social History Narrative  . Not on file   Social Determinants of Health   Financial Resource Strain:   . Difficulty of Paying Living Expenses: Not on file  Food Insecurity:   . Worried About Charity fundraiser in the Last Year: Not on file  . Ran Out of Food in the Last Year: Not on file  Transportation Needs:   . Lack of Transportation (Medical): Not on file  .  Lack of Transportation (Non-Medical): Not on file  Physical Activity:   . Days of Exercise per Week: Not on file  . Minutes of Exercise per Session: Not on file  Stress:   . Feeling of Stress : Not on file  Social Connections:   . Frequency of Communication with Friends and Family: Not on file  . Frequency of Social Gatherings with Friends and Family: Not on file  . Attends Religious Services: Not on file  . Active Member of Clubs or Organizations: Not on file  . Attends Archivist Meetings: Not on file  . Marital Status: Not on file  Intimate Partner Violence:   . Fear of Current or Ex-Partner: Not on file  . Emotionally Abused:  Not on file  . Physically Abused: Not on file  . Sexually Abused: Not on file    No Known Allergies  Family History  Problem Relation Age of Onset  . Cancer Father   . Aneurysm Father       Prior to Admission medications   Medication Sig Start Date End Date Taking? Authorizing Provider  atorvastatin (LIPITOR) 20 MG tablet Take 20 mg by mouth daily.  08/25/17  Yes [provider]  zinc gluconate 50 MG tablet Take 50 mg by mouth daily.   Yes [provider]    Physical Exam: Vitals:   05/28/19 1945 05/28/19 2000 05/28/19 2030 05/28/19 2045  BP: (!) 158/62 (!) 141/67 (!) 149/73 (!) 150/67  Pulse: (!) 104 (!) 107    Resp: (!) 21 (!) 21 19 19   Temp:      TempSrc:      SpO2: 100% 99%    Weight:      Height:         General: Agitated and intermittently sedated. Elderly  Eyes:  PERRL, EOMI, normal lids, iris ENT: Dry MM grossly normal hearing,  Neck:  no LAD, masses or thyromegaly Cardiovascular:  RRR, no m/r/g. No LE edema.  Respiratory:  CTA bilaterally, no w/r/r. Normal respiratory effort. Abdomen:  soft, ntnd, NABS Skin:  no rash or induration seen on limited exam Musculoskeletal:  grossly normal tone BUE/BLE, good ROM, no bony abnormality Psychiatric: Intermittently agitated and unable to follow commands. AOx0 Neurologic: Intention tremor when trying to pull self out of bed. moves all extremities in coordinated fashion, sensation intact  Labs on Admission: I have personally reviewed following labs and imaging studies  CBC: Recent Labs  Lab 05/28/19 1438 05/28/19 1549  WBC 2.8*  --   NEUTROABS 1.6*  --   HGB 12.2* 12.2*  HCT 36.0* 36.0*  MCV 100.6*  --   PLT 68*  --    Basic Metabolic Panel: Recent Labs  Lab 05/28/19 1438 05/28/19 1549  NA 139 142  K 3.8 3.8  CL 113* 109  CO2 21*  --   GLUCOSE 106* 98  BUN 16 15  CREATININE 0.93 0.90  CALCIUM 8.1*  --    GFR: Estimated Creatinine Clearance: 102.1 mL/min (by C-G formula based on SCr  of 0.9 mg/dL). Liver Function Tests: Recent Labs  Lab 05/28/19 1438  AST 48*  ALT 28  ALKPHOS 98  BILITOT 2.3*  PROT 5.5*  ALBUMIN 2.9*   No results for input(s): LIPASE, AMYLASE in the last 168 hours. Recent Labs  Lab 05/28/19 1515  AMMONIA 275*   Coagulation Profile: Recent Labs  Lab 05/28/19 1438  INR 1.7*   Cardiac Enzymes: No results for input(s): CKTOTAL, CKMB, CKMBINDEX, TROPONINI in  the last 168 hours. BNP (last 3 results) No results for input(s): PROBNP in the last 8760 hours. HbA1C: No results for input(s): HGBA1C in the last 72 hours. CBG: Recent Labs  Lab 05/28/19 1510  GLUCAP 98   Lipid Profile: No results for input(s): CHOL, HDL, LDLCALC, TRIG, CHOLHDL, LDLDIRECT in the last 72 hours. Thyroid Function Tests: No results for input(s): TSH, T4TOTAL, FREET4, T3FREE, THYROIDAB in the last 72 hours. Anemia Panel: No results for input(s): VITAMINB12, FOLATE, FERRITIN, TIBC, IRON, RETICCTPCT in the last 72 hours. Urine analysis:    Component Value Date/Time   COLORURINE YELLOW 05/28/2019 1759   APPEARANCEUR HAZY (A) 05/28/2019 1759   LABSPEC >1.046 (H) 05/28/2019 1759   PHURINE 7.0 05/28/2019 1759   GLUCOSEU NEGATIVE 05/28/2019 1759   HGBUR NEGATIVE 05/28/2019 1759   BILIRUBINUR NEGATIVE 05/28/2019 1759   KETONESUR NEGATIVE 05/28/2019 1759   PROTEINUR NEGATIVE 05/28/2019 1759   NITRITE NEGATIVE 05/28/2019 1759   LEUKOCYTESUR NEGATIVE 05/28/2019 1759    Creatinine Clearance: Estimated Creatinine Clearance: 102.1 mL/min (by C-G formula based on SCr of 0.9 mg/dL).  Sepsis Labs: @LABRCNTIP (procalcitonin:4,lacticidven:4) ) Recent Results (from the past 240 hour(s))  Respiratory Panel by RT PCR (Flu A&B, Covid) - Nasopharyngeal Swab     Status: None   Collection Time: 05/28/19  2:51 PM   Specimen: Nasopharyngeal Swab  Result Value Ref Range Status   SARS Coronavirus 2 by RT PCR NEGATIVE NEGATIVE Final    Comment: (NOTE) SARS-CoV-2 target nucleic  acids are NOT DETECTED. The SARS-CoV-2 RNA is generally detectable in upper respiratoy specimens during the acute phase of infection. The lowest concentration of SARS-CoV-2 viral copies this assay can detect is 131 copies/mL. A negative result does not preclude SARS-Cov-2 infection and should not be used as the sole basis for treatment or other patient management decisions. A negative result may occur with  improper specimen collection/handling, submission of specimen other than nasopharyngeal swab, presence of viral mutation(s) within the areas targeted by this assay, and inadequate number of viral copies (<131 copies/mL). A negative result must be combined with clinical observations, patient history, and epidemiological information. The expected result is Negative. Fact Sheet for Patients:  PinkCheek.be Fact Sheet for Healthcare Providers:  GravelBags.it This test is not yet ap proved or cleared by the [place] FDA and  has been authorized for detection and/or diagnosis of SARS-CoV-2 by FDA under an Emergency Use Authorization (EUA). This EUA will remain  in effect (meaning this test can be used) for the duration of the COVID-19 declaration under Section 564(b)(1) of the Act, 21 U.S.C. section 360bbb-3(b)(1), unless the authorization is terminated or revoked sooner.    Influenza A by PCR NEGATIVE NEGATIVE Final   Influenza B by PCR NEGATIVE NEGATIVE Final    Comment: (NOTE) The Xpert Xpress SARS-CoV-2/FLU/RSV assay is intended as an aid in  the diagnosis of influenza from Nasopharyngeal swab specimens and  should not be used as a sole basis for treatment. Nasal washings and  aspirates are unacceptable for Xpert Xpress SARS-CoV-2/FLU/RSV  testing. Fact Sheet for Patients: PinkCheek.be Fact Sheet for Healthcare Providers: GravelBags.it This test is not yet  approved or cleared by the [place] FDA and  has been authorized for detection and/or diagnosis of SARS-CoV-2 by  FDA under an Emergency Use Authorization (EUA). This EUA will remain  in effect (meaning this test can be used) for the duration of the  Covid-19 declaration under Section 564(b)(1) of the Act, 21  U.S.C. section 360bbb-3(b)(1), unless  the authorization is  terminated or revoked. Performed at [facility], [address], [place], [place] [address]      Radiological Exams on Admission: CT Angio Head W or Wo Contrast  Result Date: 05/28/2019 CLINICAL DATA:  Right-sided weakness. Altered mental status. EXAM: CT ANGIOGRAPHY HEAD AND NECK CT PERFUSION BRAIN TECHNIQUE: Multidetector CT imaging of the head and neck was performed using the standard protocol during bolus administration of intravenous contrast. Multiplanar CT image reconstructions and MIPs were obtained to evaluate the vascular anatomy. Carotid stenosis measurements (when applicable) are obtained utilizing NASCET criteria, using the distal internal carotid diameter as the denominator. Multiphase CT imaging of the brain was performed following IV bolus contrast injection. Subsequent parametric perfusion maps were calculated using RAPID software. CONTRAST:  133m OMNIPAQUE IOHEXOL 350 MG/ML SOLN COMPARISON:  None. FINDINGS: CTA NECK FINDINGS Aortic arch: Normal variant aortic arch branching pattern with common origin of the brachiocephalic and left common carotid arteries. Mild aortic atherosclerosis without significant arch vessel origin stenosis. Right carotid system: Patent with mild calcified plaque at the carotid bifurcation. No evidence of significant stenosis or dissection. Tortuous mid cervical ICA. Left carotid system: Patent with mild-to-moderate calcified plaque in the carotid bulb and minimal plaque in the distal cervical ICA. No evidence of significant stenosis or dissection. Tortuous distal cervical ICA. Vertebral  arteries: Patent and codominant with moderate to severe stenosis near the left V3-V4 junction. Skeleton: Cervical spondylosis with uncovertebral spurring resulting in advanced neural foraminal stenosis on the right at C5-6 and bilaterally at C6-7. At least mild spinal stenosis at C6-7 due to a broad-based posterior disc osteophyte complex. Other neck: No evidence of cervical lymphadenopathy or mass. Upper chest: Clear lung apices. Review of the MIP images confirms the above findings CTA HEAD FINDINGS Anterior circulation: The internal carotid arteries are patent from skull base to carotid termini with calcified plaque resulting in moderate left greater than right cavernous and mild-to-moderate left paraclinoid stenoses. ACAs and MCAs are patent without evidence of proximal branch occlusion or flow limiting proximal stenosis. There is mild right A1 segment irregular narrowing versus artifact. No aneurysm is identified. Posterior circulation: The intracranial vertebral arteries are patent to the basilar with atherosclerosis bilaterally resulting in severe proximal V4 stenosis on the left. The right PICA and left AICA appear dominant. Patent SCA origins are seen bilaterally. The basilar artery is patent and congenitally small in caliber diffusely. There are large posterior communicating arteries with hypoplastic P1 segments bilaterally. Both PCAs are patent without evidence of flow limiting proximal stenosis. No aneurysm is identified. Venous sinuses: Patent. Anatomic variants: Predominantly fetal type origin of the PCAs. Review of the MIP images confirms the above findings CT Brain Perfusion Findings: ASPECTS: 10 CBF (<30%) Volume: 0 mL Perfusion (Tmax>6.0s) volume: 81 mL, primarily localizing to the superior cerebellum, brainstem, and right greater than left temporo-occipital regions and felt to reflect artifact Mismatch Volume: Not applicable given suspected artifact described above Infarction Location: Not  applicable IMPRESSION: 1. No large vessel occlusion. 2. Intracranial atherosclerosis with moderate bilateral ICA and severe left V4 stenoses. 3. Mild cervical carotid artery atherosclerosis without stenosis. 4. No core infarct or definite abnormal perfusion identified on CTP. 5.  Aortic Atherosclerosis (ICD10-I70.0). The preliminary finding of no LVO was communicated via telephone to Dr. SEzequiel Essexcore on 05/28/2019 at 2:58 pm. Electronically Signed   By: ALogan BoresM.D.   On: 05/28/2019 15:46   CT Angio Neck W and/or Wo Contrast  Result Date: 05/28/2019 CLINICAL DATA:  Right-sided weakness. Altered mental status. EXAM: CT ANGIOGRAPHY HEAD AND NECK CT PERFUSION BRAIN TECHNIQUE: Multidetector CT imaging of the head and neck was performed using the standard protocol during bolus administration of intravenous contrast. Multiplanar CT image reconstructions and MIPs were obtained to evaluate the vascular anatomy. Carotid stenosis measurements (when applicable) are obtained utilizing NASCET criteria, using the distal internal carotid diameter as the denominator. Multiphase CT imaging of the brain was performed following IV bolus contrast injection. Subsequent parametric perfusion maps were calculated using RAPID software. CONTRAST:  163m OMNIPAQUE IOHEXOL 350 MG/ML SOLN COMPARISON:  None. FINDINGS: CTA NECK FINDINGS Aortic arch: Normal variant aortic arch branching pattern with common origin of the brachiocephalic and left common carotid arteries. Mild aortic atherosclerosis without significant arch vessel origin stenosis. Right carotid system: Patent with mild calcified plaque at the carotid bifurcation. No evidence of significant stenosis or dissection. Tortuous mid cervical ICA. Left carotid system: Patent with mild-to-moderate calcified plaque in the carotid bulb and minimal plaque in the distal cervical ICA. No evidence of significant stenosis or dissection. Tortuous distal cervical ICA. Vertebral  arteries: Patent and codominant with moderate to severe stenosis near the left V3-V4 junction. Skeleton: Cervical spondylosis with uncovertebral spurring resulting in advanced neural foraminal stenosis on the right at C5-6 and bilaterally at C6-7. At least mild spinal stenosis at C6-7 due to a broad-based posterior disc osteophyte complex. Other neck: No evidence of cervical lymphadenopathy or mass. Upper chest: Clear lung apices. Review of the MIP images confirms the above findings CTA HEAD FINDINGS Anterior circulation: The internal carotid arteries are patent from skull base to carotid termini with calcified plaque resulting in moderate left greater than right cavernous and mild-to-moderate left paraclinoid stenoses. ACAs and MCAs are patent without evidence of proximal branch occlusion or flow limiting proximal stenosis. There is mild right A1 segment irregular narrowing versus artifact. No aneurysm is identified. Posterior circulation: The intracranial vertebral arteries are patent to the basilar with atherosclerosis bilaterally resulting in severe proximal V4 stenosis on the left. The right PICA and left AICA appear dominant. Patent SCA origins are seen bilaterally. The basilar artery is patent and congenitally small in caliber diffusely. There are large posterior communicating arteries with hypoplastic P1 segments bilaterally. Both PCAs are patent without evidence of flow limiting proximal stenosis. No aneurysm is identified. Venous sinuses: Patent. Anatomic variants: Predominantly fetal type origin of the PCAs. Review of the MIP images confirms the above findings CT Brain Perfusion Findings: ASPECTS: 10 CBF (<30%) Volume: 0 mL Perfusion (Tmax>6.0s) volume: 81 mL, primarily localizing to the superior cerebellum, brainstem, and right greater than left temporo-occipital regions and felt to reflect artifact Mismatch Volume: Not applicable given suspected artifact described above Infarction Location: Not  applicable IMPRESSION: 1. No large vessel occlusion. 2. Intracranial atherosclerosis with moderate bilateral ICA and severe left V4 stenoses. 3. Mild cervical carotid artery atherosclerosis without stenosis. 4. No core infarct or definite abnormal perfusion identified on CTP. 5.  Aortic Atherosclerosis (ICD10-I70.0). The preliminary finding of no LVO was communicated via telephone to Dr. SEzequiel Essexcore on 05/28/2019 at 2:58 pm. Electronically Signed   By: ALogan BoresM.D.   On: 05/28/2019 15:46   CT CEREBRAL PERFUSION W CONTRAST  Result Date: 05/28/2019 CLINICAL DATA:  Right-sided weakness. Altered mental status. EXAM: CT ANGIOGRAPHY HEAD AND NECK CT PERFUSION BRAIN TECHNIQUE: Multidetector CT imaging of the head and neck was performed using the standard protocol during bolus administration of intravenous contrast. Multiplanar CT image reconstructions and MIPs were  obtained to evaluate the vascular anatomy. Carotid stenosis measurements (when applicable) are obtained utilizing NASCET criteria, using the distal internal carotid diameter as the denominator. Multiphase CT imaging of the brain was performed following IV bolus contrast injection. Subsequent parametric perfusion maps were calculated using RAPID software. CONTRAST:  182m OMNIPAQUE IOHEXOL 350 MG/ML SOLN COMPARISON:  None. FINDINGS: CTA NECK FINDINGS Aortic arch: Normal variant aortic arch branching pattern with common origin of the brachiocephalic and left common carotid arteries. Mild aortic atherosclerosis without significant arch vessel origin stenosis. Right carotid system: Patent with mild calcified plaque at the carotid bifurcation. No evidence of significant stenosis or dissection. Tortuous mid cervical ICA. Left carotid system: Patent with mild-to-moderate calcified plaque in the carotid bulb and minimal plaque in the distal cervical ICA. No evidence of significant stenosis or dissection. Tortuous distal cervical ICA. Vertebral arteries:  Patent and codominant with moderate to severe stenosis near the left V3-V4 junction. Skeleton: Cervical spondylosis with uncovertebral spurring resulting in advanced neural foraminal stenosis on the right at C5-6 and bilaterally at C6-7. At least mild spinal stenosis at C6-7 due to a broad-based posterior disc osteophyte complex. Other neck: No evidence of cervical lymphadenopathy or mass. Upper chest: Clear lung apices. Review of the MIP images confirms the above findings CTA HEAD FINDINGS Anterior circulation: The internal carotid arteries are patent from skull base to carotid termini with calcified plaque resulting in moderate left greater than right cavernous and mild-to-moderate left paraclinoid stenoses. ACAs and MCAs are patent without evidence of proximal branch occlusion or flow limiting proximal stenosis. There is mild right A1 segment irregular narrowing versus artifact. No aneurysm is identified. Posterior circulation: The intracranial vertebral arteries are patent to the basilar with atherosclerosis bilaterally resulting in severe proximal V4 stenosis on the left. The right PICA and left AICA appear dominant. Patent SCA origins are seen bilaterally. The basilar artery is patent and congenitally small in caliber diffusely. There are large posterior communicating arteries with hypoplastic P1 segments bilaterally. Both PCAs are patent without evidence of flow limiting proximal stenosis. No aneurysm is identified. Venous sinuses: Patent. Anatomic variants: Predominantly fetal type origin of the PCAs. Review of the MIP images confirms the above findings CT Brain Perfusion Findings: ASPECTS: 10 CBF (<30%) Volume: 0 mL Perfusion (Tmax>6.0s) volume: 81 mL, primarily localizing to the superior cerebellum, brainstem, and right greater than left temporo-occipital regions and felt to reflect artifact Mismatch Volume: Not applicable given suspected artifact described above Infarction Location: Not applicable  IMPRESSION: 1. No large vessel occlusion. 2. Intracranial atherosclerosis with moderate bilateral ICA and severe left V4 stenoses. 3. Mild cervical carotid artery atherosclerosis without stenosis. 4. No core infarct or definite abnormal perfusion identified on CTP. 5.  Aortic Atherosclerosis (ICD10-I70.0). The preliminary finding of no LVO was communicated via telephone to Dr. SEzequiel Essexcore on 05/28/2019 at 2:58 pm. Electronically Signed   By: ALogan BoresM.D.   On: 05/28/2019 15:46   CT HEAD CODE STROKE WO CONTRAST  Result Date: 05/28/2019 CLINICAL DATA:  Code stroke. Right-sided weakness. Altered mental status. EXAM: CT HEAD WITHOUT CONTRAST TECHNIQUE: Contiguous axial images were obtained from the base of the skull through the vertex without intravenous contrast. COMPARISON:  None. FINDINGS: Brain: The study is mildly motion degraded despite repeat imaging. Within this limitation, no acute infarct, intracranial hemorrhage, mass, midline shift, or extra-axial fluid collection is identified. The ventricles and sulci are within normal limits for age. Hypodensities in the cerebral white matter bilaterally are nonspecific but compatible with  mild chronic small vessel ischemic disease. Vascular: Calcified atherosclerosis at the skull base. No hyperdense vessel. Skull: No fracture or suspicious osseous lesion. Sinuses/Orbits: Visualized paranasal sinuses and mastoid air cells are clear. Bilateral cataract extraction is noted. Other: None. ASPECTS [facility] Stroke Program Early CT Score) - Ganglionic level infarction (caudate, lentiform nuclei, internal capsule, insula, M1-M3 cortex): 7 - Supraganglionic infarction (M4-M6 cortex): 3 Total score (0-10 with 10 being normal): 10 IMPRESSION: 1. No evidence of acute intracranial abnormality. 2. ASPECTS is 10. 3. Mild chronic small vessel ischemic disease. These results were called by telephone at the time of interpretation on 05/28/2019 at 2:58 pm to Dr. Ezequiel Essex , who verbally acknowledged these results. Electronically Signed   By: Logan Bores M.D.   On: 05/28/2019 15:04    EKG: Independently reviewed. Ectopic tachy, no ACS,   Assessment/Plan Active Problems:   Hepatic cirrhosis (HCC)   Acute encephalopathy   Peripheral arterial disease (HCC)   OSA (obstructive sleep apnea)   HLD (hyperlipidemia)   Acute Encephalopathy: likely secondary to elevated ammonia from cirrhosis. Doubt true seizure. Neuro consulted by ED and CT w/o evidence of stroke or other acute neuroligic abnormality.  - Treatment of cirrhosis as below.  - Neuro checks.  - continue to follow up w/ Neuro recommendations.  - Ativan and Trazodone PRN for agitation - hold further keppra for now - BCX, monitor for signs of infection.  - consider EEG and or MRI in am if not improving.   Hepatic Cirrhosis: Secondary to NASH. Likely advanced to a tipping point secondary to recent COVID illness. Followed by Dr. Laural Golden.  - [place] in am to evaluate for change since recent MRI - Lactulose PR  PAD: CTA showing intracranial atherosclerosis w/ mod bilat ICA and Severe L V4 stenosis. - Outpt f/u w/ vascular  OSA:  - CPAP  HLD:  - hold lipitor for now    DVT prophylaxis: Hep  Code Status: Full  Family Communication: Wife and daughter  Disposition Plan: penidng improvement in mental status  Consults called: NEuro  Admission status: inpt    Waldemar Dickens MD [facility]  If 7PM-7AM, please contact night-coverage [email or web address] Password [facility]  05/28/2019, 9:08 PM

## 2019-05-28 NOTE — ED Notes (Signed)
Rectal tube reinserted. External cath reinforced.

## 2019-05-28 NOTE — ED Notes (Signed)
Pt remains in CT at this time.

## 2019-05-28 NOTE — ED Notes (Signed)
Dr. Rancour at bedside. 

## 2019-05-28 NOTE — ED Triage Notes (Signed)
Pt brought to ED via [facility] for slurred speech, unable to follow commands, [id number]. Dr Wyvonnia Dusky to evaluate pt.

## 2019-05-28 NOTE — ED Notes (Signed)
Hospitalist at bedside 

## 2019-05-28 NOTE — ED Provider Notes (Signed)
  Medical screening examination/treatment/procedure(s) were conducted as a shared visit with non-physician practitioner(s) and myself.  I personally evaluated the patient during the encounter.      Level 5 caveat for acuity of condition. Patient from home with AMS. LSN 9am. Code stroke activated on arrival. Wife reports patient came into living room naked from waist down and itching R foot. Certainly abnormal behavior for him. Had slurred speech and difficulty speaking as well. Has had "slow speech and shuffling" since COVID infection in October 2020.  Dysarthric, mumbles a few words. Follows some commands.  Knows name is Cody Austin. Holds arms against bilaterally. Drift on R> Unable to lift legs off bed bilaterally.  No facial droop.   CT head negative.  Seen with neurology on arrival.  Some tremors on R side and given ativan and loaded with keppra.  CTA negative for large vessel occlusion.  Not candidate for tPA or endovascular intervention per neurology.  Agree with treatment for possible seizure.  Protecting airway.  Ammonia level pending with history of cirrhosis.   CRITICAL CARE Performed by: Ezequiel Essex Total critical care time:45 minutes Critical care time was exclusive of separately billable procedures and treating other patients. Critical care was necessary to treat or prevent imminent or life-threatening deterioration. Critical care was time spent personally by me on the following activities: development of treatment plan with patient and/or surrogate as well as nursing, discussions with consultants, evaluation of patient's response to treatment, examination of patient, obtaining history from patient or surrogate, ordering and performing treatments and interventions, ordering and review of laboratory studies, ordering and review of radiographic studies, pulse oximetry and re-evaluation of patient's condition.    Ezequiel Essex, MD 05/28/19 2042

## 2019-05-28 NOTE — ED Provider Notes (Signed)
[facility] EMERGENCY DEPARTMENT Provider Note   CSN: [id number] Arrival date & time: 05/28/19  1429  An emergency department physician performed an initial assessment on this suspected stroke patient at 1433.  History Chief Complaint  Patient presents with  . Code Stroke    Cody Austin is a 69 y.o. male with a past medical history of DM, hepatic cirrhosis, chronic kidney disease, who presents today for evaluation of altered mental status.  According to patient's wife he was last seen normal at about 9 AM. After that he walked into the living room without pants on attempting to reach his foot and was having difficulty speaking.  This is significantly abnormal for him.  Apparently when EMS got there he was able to speak saying that everything was fine.   Review shows he has a history of cirrhosis.   Patient is able to state his name and that he does not have pain.  HPI     Past Medical History:  Diagnosis Date  . Arthritis   . Chronic kidney disease    left renal stone  . Diabetes mellitus without complication ([place])    Pre-Diabetes per MD  . Fatty liver   . Heart murmur   . Other pancytopenia ([place]) 11/19/2014  . Sleep apnea    uses CPAP    Patient Active Problem List   Diagnosis Date Noted  . Acute encephalopathy 05/28/2019  . Pancreatic cyst 11/20/2018  . Pre-diabetes 11/19/2014  . Obesity 11/19/2014  . Hepatic cirrhosis ([place]) 11/19/2014  . Cirrhosis, nonalcoholic ([place]) 23/76/2831  . Other pancytopenia ([place]) 11/19/2014    Past Surgical History:  Procedure Laterality Date  . CATARACT EXTRACTION W/PHACO Right 08/12/2014   Procedure: CATARACT EXTRACTION PHACO AND INTRAOCULAR LENS PLACEMENT (IOC);  Surgeon: [name], MD;  Location: [facility] ORS;  Service: Ophthalmology;  Laterality: Right;  CDE 9.32  . CATARACT EXTRACTION W/PHACO Left 08/26/2014   Procedure: CATARACT EXTRACTION PHACO AND INTRAOCULAR LENS PLACEMENT (IOC);  Surgeon: [name], MD;  Location: [facility] ORS;   Service: Ophthalmology;  Laterality: Left;  CDE:  9.49  . COLONOSCOPY N/A 12/13/2014   Procedure: COLONOSCOPY;  Surgeon: Rogene Houston, MD;  Location: [facility] ENDO SUITE;  Service: Endoscopy;  Laterality: N/A;  100  . ESOPHAGOGASTRODUODENOSCOPY N/A 12/13/2014   Procedure: ESOPHAGOGASTRODUODENOSCOPY (EGD);  Surgeon: Rogene Houston, MD;  Location: [facility] END[facility] SUITE;  Service: Endoscopy;  Laterality: N/A;  . HERNIA REPAIR Right    Inguinal hernia       Family History  Problem Relation Age of Onset  . Cancer Father   . Aneurysm Father     Social History   Tobacco Use  . Smoking status: Never Smoker  . Smokeless tobacco: Never Used  Substance Use Topics  . Alcohol use: Yes    Alcohol/week: 0.0 standard drinks    Comment: occ  . Drug use: No    Home Medications Prior to Admission medications   Medication Sig Start Date End Date Taking? Authorizing Provider  atorvastatin (LIPITOR) 20 MG tablet Take 20 mg by mouth daily.  08/25/17   [provider]  fenofibrate (TRICOR) 145 MG tablet Take 145 mg by mouth at bedtime.     [provider]  Omega-3 Fatty Acids (FISH OIL OMEGA-3 PO) Take 1 capsule by mouth daily.     [provider]  zinc gluconate 50 MG tablet Take 50 mg by mouth daily.    [provider]    Allergies    Patient  has no known allergies.  Review of Systems   Review of Systems  Unable to perform ROS: Mental status change    Physical Exam Updated Vital Signs BP (!) 172/78   Pulse (!) 106   Temp (!) 96.4 F (35.8 C) (Rectal)   Resp 20   Ht 6' (1.829 m)   Wt 113.4 kg   SpO2 99%   BMI 33.91 kg/m   Physical Exam Vitals and nursing note reviewed.  Constitutional:      Appearance: He is well-developed. He is ill-appearing and diaphoretic.  HENT:     Head: Normocephalic and atraumatic.  Eyes:     General: No scleral icterus.       Right eye: No discharge.        Left eye: No discharge.     Conjunctiva/sclera: Conjunctivae  normal.  Cardiovascular:     Rate and Rhythm: Normal rate and regular rhythm.     Pulses: Normal pulses.     Heart sounds: Normal heart sounds.  Pulmonary:     Effort: Pulmonary effort is normal. No respiratory distress.     Breath sounds: No stridor.  Abdominal:     General: There is no distension.     Tenderness: There is no abdominal tenderness. There is no guarding.  Musculoskeletal:        General: No deformity.     Cervical back: Normal range of motion and neck supple. No rigidity.     Right lower leg: No edema.     Left lower leg: No edema.     Comments: Able to induce clonus of the RLE with ankle flexion. Not with LLE.   Skin:    General: Skin is warm.  Neurological:     Cranial Nerves: Dysarthria present. No facial asymmetry.     Motor: Tremor and abnormal muscle tone present.     Comments: Patient is awake.  He is able to answer that his name is Cody Austin.  He is unable to consistently follow commands.  He appears to have right-sided arm drift.  He is unable to hold his bilateral legs off the stretcher.  Pupils are not significantly pinpoint.  He has intermittent tremors of the right arm and leg.  During these times he does not have any atypical eye movements.  Psychiatric:     Comments: Unable to assess secondary to mental status.     ED Results / Procedures / Treatments   Labs (all labs ordered are listed, but only abnormal results are displayed) Labs Reviewed  PROTIME-INR - Abnormal; Notable for the following components:      Result Value   Prothrombin Time 19.5 (*)    INR 1.7 (*)    All other components within normal limits  APTT - Abnormal; Notable for the following components:   aPTT 42 (*)    All other components within normal limits  CBC - Abnormal; Notable for the following components:   WBC 2.8 (*)    RBC 3.58 (*)    Hemoglobin 12.2 (*)    HCT 36.0 (*)    MCV 100.6 (*)    MCH 34.1 (*)    Platelets 68 (*)    All other components within normal limits    DIFFERENTIAL - Abnormal; Notable for the following components:   Neutro Abs 1.6 (*)    All other components within normal limits  COMPREHENSIVE METABOLIC PANEL - Abnormal; Notable for the following components:   Chloride 113 (*)    CO2 21 (*)  Glucose, Bld 106 (*)    Calcium 8.1 (*)    Total Protein 5.5 (*)    Albumin 2.9 (*)    AST 48 (*)    Total Bilirubin 2.3 (*)    All other components within normal limits  URINALYSIS, ROUTINE W REFLEX MICROSCOPIC - Abnormal; Notable for the following components:   APPearance HAZY (*)    Specific Gravity, Urine >1.046 (*)    All other components within normal limits  AMMONIA - Abnormal; Notable for the following components:   Ammonia 275 (*)    All other components within normal limits  ACETAMINOPHEN LEVEL - Abnormal; Notable for the following components:   Acetaminophen (Tylenol), Serum <10 (*)    All other components within normal limits  I-STAT CHEM 8, ED - Abnormal; Notable for the following components:   Calcium, Ion 1.12 (*)    TCO2 21 (*)    Hemoglobin 12.2 (*)    HCT 36.0 (*)    All other components within normal limits  RESPIRATORY PANEL BY RT PCR (FLU A&B, COVID)  RAPID URINE DRUG SCREEN, HOSP PERFORMED  ETHANOL  CBG MONITORING, ED    EKG None  Radiology CT Angio Head W or Wo Contrast  Result Date: 05/28/2019 CLINICAL DATA:  Right-sided weakness. Altered mental status. EXAM: CT ANGIOGRAPHY HEAD AND NECK CT PERFUSION BRAIN TECHNIQUE: Multidetector CT imaging of the head and neck was performed using the standard protocol during bolus administration of intravenous contrast. Multiplanar CT image reconstructions and MIPs were obtained to evaluate the vascular anatomy. Carotid stenosis measurements (when applicable) are obtained utilizing NASCET criteria, using the distal internal carotid diameter as the denominator. Multiphase CT imaging of the brain was performed following IV bolus contrast injection. Subsequent parametric  perfusion maps were calculated using RAPID software. CONTRAST:  141m OMNIPAQUE IOHEXOL 350 MG/ML SOLN COMPARISON:  None. FINDINGS: CTA NECK FINDINGS Aortic arch: Normal variant aortic arch branching pattern with common origin of the brachiocephalic and left common carotid arteries. Mild aortic atherosclerosis without significant arch vessel origin stenosis. Right carotid system: Patent with mild calcified plaque at the carotid bifurcation. No evidence of significant stenosis or dissection. Tortuous mid cervical ICA. Left carotid system: Patent with mild-to-moderate calcified plaque in the carotid bulb and minimal plaque in the distal cervical ICA. No evidence of significant stenosis or dissection. Tortuous distal cervical ICA. Vertebral arteries: Patent and codominant with moderate to severe stenosis near the left V3-V4 junction. Skeleton: Cervical spondylosis with uncovertebral spurring resulting in advanced neural foraminal stenosis on the right at C5-6 and bilaterally at C6-7. At least mild spinal stenosis at C6-7 due to a broad-based posterior disc osteophyte complex. Other neck: No evidence of cervical lymphadenopathy or mass. Upper chest: Clear lung apices. Review of the MIP images confirms the above findings CTA HEAD FINDINGS Anterior circulation: The internal carotid arteries are patent from skull base to carotid termini with calcified plaque resulting in moderate left greater than right cavernous and mild-to-moderate left paraclinoid stenoses. ACAs and MCAs are patent without evidence of proximal branch occlusion or flow limiting proximal stenosis. There is mild right A1 segment irregular narrowing versus artifact. No aneurysm is identified. Posterior circulation: The intracranial vertebral arteries are patent to the basilar with atherosclerosis bilaterally resulting in severe proximal V4 stenosis on the left. The right PICA and left AICA appear dominant. Patent SCA origins are seen bilaterally. The  basilar artery is patent and congenitally small in caliber diffusely. There are large posterior communicating arteries with hypoplastic P1 segments  bilaterally. Both PCAs are patent without evidence of flow limiting proximal stenosis. No aneurysm is identified. Venous sinuses: Patent. Anatomic variants: Predominantly fetal type origin of the PCAs. Review of the MIP images confirms the above findings CT Brain Perfusion Findings: ASPECTS: 10 CBF (<30%) Volume: 0 mL Perfusion (Tmax>6.0s) volume: 81 mL, primarily localizing to the superior cerebellum, brainstem, and right greater than left temporo-occipital regions and felt to reflect artifact Mismatch Volume: Not applicable given suspected artifact described above Infarction Location: Not applicable IMPRESSION: 1. No large vessel occlusion. 2. Intracranial atherosclerosis with moderate bilateral ICA and severe left V4 stenoses. 3. Mild cervical carotid artery atherosclerosis without stenosis. 4. No core infarct or definite abnormal perfusion identified on CTP. 5.  Aortic Atherosclerosis (ICD10-I70.0). The preliminary finding of no LVO was communicated via telephone to Dr. Ezequiel Essex core on 05/28/2019 at 2:58 pm. Electronically Signed   By: Logan Bores M.D.   On: 05/28/2019 15:46   CT Angio Neck W and/or Wo Contrast  Result Date: 05/28/2019 CLINICAL DATA:  Right-sided weakness. Altered mental status. EXAM: CT ANGIOGRAPHY HEAD AND NECK CT PERFUSION BRAIN TECHNIQUE: Multidetector CT imaging of the head and neck was performed using the standard protocol during bolus administration of intravenous contrast. Multiplanar CT image reconstructions and MIPs were obtained to evaluate the vascular anatomy. Carotid stenosis measurements (when applicable) are obtained utilizing NASCET criteria, using the distal internal carotid diameter as the denominator. Multiphase CT imaging of the brain was performed following IV bolus contrast injection. Subsequent parametric perfusion  maps were calculated using RAPID software. CONTRAST:  134m OMNIPAQUE IOHEXOL 350 MG/ML SOLN COMPARISON:  None. FINDINGS: CTA NECK FINDINGS Aortic arch: Normal variant aortic arch branching pattern with common origin of the brachiocephalic and left common carotid arteries. Mild aortic atherosclerosis without significant arch vessel origin stenosis. Right carotid system: Patent with mild calcified plaque at the carotid bifurcation. No evidence of significant stenosis or dissection. Tortuous mid cervical ICA. Left carotid system: Patent with mild-to-moderate calcified plaque in the carotid bulb and minimal plaque in the distal cervical ICA. No evidence of significant stenosis or dissection. Tortuous distal cervical ICA. Vertebral arteries: Patent and codominant with moderate to severe stenosis near the left V3-V4 junction. Skeleton: Cervical spondylosis with uncovertebral spurring resulting in advanced neural foraminal stenosis on the right at C5-6 and bilaterally at C6-7. At least mild spinal stenosis at C6-7 due to a broad-based posterior disc osteophyte complex. Other neck: No evidence of cervical lymphadenopathy or mass. Upper chest: Clear lung apices. Review of the MIP images confirms the above findings CTA HEAD FINDINGS Anterior circulation: The internal carotid arteries are patent from skull base to carotid termini with calcified plaque resulting in moderate left greater than right cavernous and mild-to-moderate left paraclinoid stenoses. ACAs and MCAs are patent without evidence of proximal branch occlusion or flow limiting proximal stenosis. There is mild right A1 segment irregular narrowing versus artifact. No aneurysm is identified. Posterior circulation: The intracranial vertebral arteries are patent to the basilar with atherosclerosis bilaterally resulting in severe proximal V4 stenosis on the left. The right PICA and left AICA appear dominant. Patent SCA origins are seen bilaterally. The basilar artery  is patent and congenitally small in caliber diffusely. There are large posterior communicating arteries with hypoplastic P1 segments bilaterally. Both PCAs are patent without evidence of flow limiting proximal stenosis. No aneurysm is identified. Venous sinuses: Patent. Anatomic variants: Predominantly fetal type origin of the PCAs. Review of the MIP images confirms the above findings CT Brain Perfusion  Findings: ASPECTS: 10 CBF (<30%) Volume: 0 mL Perfusion (Tmax>6.0s) volume: 81 mL, primarily localizing to the superior cerebellum, brainstem, and right greater than left temporo-occipital regions and felt to reflect artifact Mismatch Volume: Not applicable given suspected artifact described above Infarction Location: Not applicable IMPRESSION: 1. No large vessel occlusion. 2. Intracranial atherosclerosis with moderate bilateral ICA and severe left V4 stenoses. 3. Mild cervical carotid artery atherosclerosis without stenosis. 4. No core infarct or definite abnormal perfusion identified on CTP. 5.  Aortic Atherosclerosis (ICD10-I70.0). The preliminary finding of no LVO was communicated via telephone to Dr. Ezequiel Essex core on 05/28/2019 at 2:58 pm. Electronically Signed   By: Logan Bores M.D.   On: 05/28/2019 15:46   CT CEREBRAL PERFUSION W CONTRAST  Result Date: 05/28/2019 CLINICAL DATA:  Right-sided weakness. Altered mental status. EXAM: CT ANGIOGRAPHY HEAD AND NECK CT PERFUSION BRAIN TECHNIQUE: Multidetector CT imaging of the head and neck was performed using the standard protocol during bolus administration of intravenous contrast. Multiplanar CT image reconstructions and MIPs were obtained to evaluate the vascular anatomy. Carotid stenosis measurements (when applicable) are obtained utilizing NASCET criteria, using the distal internal carotid diameter as the denominator. Multiphase CT imaging of the brain was performed following IV bolus contrast injection. Subsequent parametric perfusion maps were  calculated using RAPID software. CONTRAST:  186m OMNIPAQUE IOHEXOL 350 MG/ML SOLN COMPARISON:  None. FINDINGS: CTA NECK FINDINGS Aortic arch: Normal variant aortic arch branching pattern with common origin of the brachiocephalic and left common carotid arteries. Mild aortic atherosclerosis without significant arch vessel origin stenosis. Right carotid system: Patent with mild calcified plaque at the carotid bifurcation. No evidence of significant stenosis or dissection. Tortuous mid cervical ICA. Left carotid system: Patent with mild-to-moderate calcified plaque in the carotid bulb and minimal plaque in the distal cervical ICA. No evidence of significant stenosis or dissection. Tortuous distal cervical ICA. Vertebral arteries: Patent and codominant with moderate to severe stenosis near the left V3-V4 junction. Skeleton: Cervical spondylosis with uncovertebral spurring resulting in advanced neural foraminal stenosis on the right at C5-6 and bilaterally at C6-7. At least mild spinal stenosis at C6-7 due to a broad-based posterior disc osteophyte complex. Other neck: No evidence of cervical lymphadenopathy or mass. Upper chest: Clear lung apices. Review of the MIP images confirms the above findings CTA HEAD FINDINGS Anterior circulation: The internal carotid arteries are patent from skull base to carotid termini with calcified plaque resulting in moderate left greater than right cavernous and mild-to-moderate left paraclinoid stenoses. ACAs and MCAs are patent without evidence of proximal branch occlusion or flow limiting proximal stenosis. There is mild right A1 segment irregular narrowing versus artifact. No aneurysm is identified. Posterior circulation: The intracranial vertebral arteries are patent to the basilar with atherosclerosis bilaterally resulting in severe proximal V4 stenosis on the left. The right PICA and left AICA appear dominant. Patent SCA origins are seen bilaterally. The basilar artery is patent  and congenitally small in caliber diffusely. There are large posterior communicating arteries with hypoplastic P1 segments bilaterally. Both PCAs are patent without evidence of flow limiting proximal stenosis. No aneurysm is identified. Venous sinuses: Patent. Anatomic variants: Predominantly fetal type origin of the PCAs. Review of the MIP images confirms the above findings CT Brain Perfusion Findings: ASPECTS: 10 CBF (<30%) Volume: 0 mL Perfusion (Tmax>6.0s) volume: 81 mL, primarily localizing to the superior cerebellum, brainstem, and right greater than left temporo-occipital regions and felt to reflect artifact Mismatch Volume: Not applicable given suspected artifact described above Infarction  Location: Not applicable IMPRESSION: 1. No large vessel occlusion. 2. Intracranial atherosclerosis with moderate bilateral ICA and severe left V4 stenoses. 3. Mild cervical carotid artery atherosclerosis without stenosis. 4. No core infarct or definite abnormal perfusion identified on CTP. 5.  Aortic Atherosclerosis (ICD10-I70.0). The preliminary finding of no LVO was communicated via telephone to Dr. Ezequiel Essex core on 05/28/2019 at 2:58 pm. Electronically Signed   By: Logan Bores M.D.   On: 05/28/2019 15:46   CT HEAD CODE STROKE WO CONTRAST  Result Date: 05/28/2019 CLINICAL DATA:  Code stroke. Right-sided weakness. Altered mental status. EXAM: CT HEAD WITHOUT CONTRAST TECHNIQUE: Contiguous axial images were obtained from the base of the skull through the vertex without intravenous contrast. COMPARISON:  None. FINDINGS: Brain: The study is mildly motion degraded despite repeat imaging. Within this limitation, no acute infarct, intracranial hemorrhage, mass, midline shift, or extra-axial fluid collection is identified. The ventricles and sulci are within normal limits for age. Hypodensities in the cerebral white matter bilaterally are nonspecific but compatible with mild chronic small vessel ischemic disease.  Vascular: Calcified atherosclerosis at the skull base. No hyperdense vessel. Skull: No fracture or suspicious osseous lesion. Sinuses/Orbits: Visualized paranasal sinuses and mastoid air cells are clear. Bilateral cataract extraction is noted. Other: None. ASPECTS [facility] Stroke Program Early CT Score) - Ganglionic level infarction (caudate, lentiform nuclei, internal capsule, insula, M1-M3 cortex): 7 - Supraganglionic infarction (M4-M6 cortex): 3 Total score (0-10 with 10 being normal): 10 IMPRESSION: 1. No evidence of acute intracranial abnormality. 2. ASPECTS is 10. 3. Mild chronic small vessel ischemic disease. These results were called by telephone at the time of interpretation on 05/28/2019 at 2:58 pm to Dr. Ezequiel Essex , who verbally acknowledged these results. Electronically Signed   By: Logan Bores M.D.   On: 05/28/2019 15:04    Procedures .Critical Care Performed by: Lorin Glass, PA-C Authorized by: Lorin Glass, PA-C   Critical care provider statement:    Critical care time (minutes):  45   Critical care was necessary to treat or prevent imminent or life-threatening deterioration of the following conditions:  CNS failure or compromise, hepatic failure and metabolic crisis   Critical care was time spent personally by me on the following activities:  Discussions with consultants, evaluation of patient's response to treatment, examination of patient, ordering and performing treatments and interventions, ordering and review of laboratory studies, ordering and review of radiographic studies, pulse oximetry, re-evaluation of patient's condition, obtaining history from patient or surrogate and review of old charts   (including critical care time)  Medications Ordered in ED Medications  iohexol (OMNIPAQUE) 350 MG/ML injection 150 mL (150 mLs Intravenous Contrast Given 05/28/19 1452)  levETIRAcetam (KEPPRA) IVPB 1500 mg/ 100 mL premix (0 mg Intravenous Stopped 05/28/19 1542)    LORazepam (ATIVAN) injection 1 mg (1 mg Intravenous Given 05/28/19 1513)  lactulose (CHRONULAC) enema 200 gm (300 mLs Rectal Given 05/28/19 1738)  0.9 %  sodium chloride infusion ( Intravenous New Bag/Given 05/28/19 1756)  LORazepam (ATIVAN) injection 0.5 mg (0.5 mg Intravenous Given 05/28/19 1853)    ED Course  I have reviewed the triage vital signs and the nursing notes.  Pertinent labs & imaging results that were available during my care of the patient were reviewed by me and considered in my medical decision making (see chart for details).  Clinical Course as of May 28 1903  [facility] May 28, 2019  1511 No evidence of acute intracranial abnormality.  CT HEAD CODE STROKE  WO CONTRAST [EH]  6754 Patient has intermittent right sided shaking of right arm and leg.  Eyes are not displaying abnormal movements.   Bilaterally pupils ERRL   [EH]  4920 I spoke with Dr. Marily Memos who will see patient for admission.    [EH]  1713 I spoke with on Call GI Dr. Gala Romney who recommends lactulose enema q4h and they will see him in the morning.    [EH]  1903 Ammonia(!): 275 [EH]    Clinical Course User Index [EH] Ollen Gross   MDM Rules/Calculators/A&P                     Patient is a 69 year old man who presents today for evaluation of a possible stroke. On initial exam he has weakness in bilateral legs, is unable to hold down off the bed along with weakness in the right arm.  Code stroke was called.  Last seen normal was 9 to 9:30 AM therefore he is outside TPA window.   CT head code stroke was obtained without evidence of hemorrhage.  CTA head, neck, and CT cerebral perfusion study were all obtained without evidence of acute ischemia.  Abs are obtained and reviewed, CBC significant for leukopenia at 2.8 which appears consistent with his baseline.  He is mildly anemic with a hemoglobin of 12.2.  He has thrombocytopenia with platelets of 68 which appears decreased from his previous.  CMP shows  albumin being low at 2.9, total protein of 5.5 again low.  Total bilirubin is elevated at 2.3.  He is not anticoagulated however his INR is high at 1.7, and his APTT is high at 42.  Ethanol and UDS are negative.  Acetaminophen level is undetected.  UA concerning for dehydration with elevated specific gravity.  UDS is negative.  Ammonia is significantly high at 275 which I suspect is the underlying reason for patient's mental status change.  Lactulose enema was ordered as patient would have an aspiration risk of attempt to get p.o.  He did have activity possibly concerning for seizure-like activity with tremors, he was given a Keppra load and treated with Ativan x2 IV. After Ativan he was more sedated however was still able to state that his name was Cody Austin and tell you to "quit" with sternal rub and was protecting his airway.    I spoke with on-call GI Dr. Gala Romney who recommends lactulose enema every 4 hours and GI will see him in the morning.  This patient was seen as a shared visit with both Drs. Rancour and  Dr. Roderic Palau.    I spoke with Dr. Barbaraann Faster, hospitalist, who will see patient for admission.   Final Clinical Impression(s) / ED Diagnoses Final diagnoses:  Hepatic encephalopathy ([place])  Serum ammonia increased ([place])  Altered mental status, unspecified altered mental status type    Rx / DC Orders ED Discharge Orders    None       Ollen Gross 05/28/19 2134    Ezequiel Essex, MD 05/28/19 2143

## 2019-05-28 NOTE — ED Notes (Signed)
Pt noted with seizure like activity lasting approx 1 minute, Dr Roderic Palau at bedside. Order received for ativan.

## 2019-05-28 NOTE — Progress Notes (Signed)
Dr. Barbaraann Faster paged to make aware that patients blood pressure was 178/88 and 184/91. No PRN medications ordered. Waiting for call back/orders. Will continue to monitor pt

## 2019-05-28 NOTE — Progress Notes (Signed)
CPAP held at this time due to AMS. RN aware.

## 2019-05-29 ENCOUNTER — Inpatient Hospital Stay (HOSPITAL_COMMUNITY): Payer: Medicare Other

## 2019-05-29 DIAGNOSIS — K7200 Acute and subacute hepatic failure without coma: Secondary | ICD-10-CM

## 2019-05-29 DIAGNOSIS — K7581 Nonalcoholic steatohepatitis (NASH): Secondary | ICD-10-CM

## 2019-05-29 DIAGNOSIS — K7460 Unspecified cirrhosis of liver: Secondary | ICD-10-CM

## 2019-05-29 DIAGNOSIS — D638 Anemia in other chronic diseases classified elsewhere: Secondary | ICD-10-CM

## 2019-05-29 DIAGNOSIS — D6959 Other secondary thrombocytopenia: Secondary | ICD-10-CM

## 2019-05-29 LAB — COMPREHENSIVE METABOLIC PANEL
ALT: 30 U/L (ref 0–44)
AST: 51 U/L — ABNORMAL HIGH (ref 15–41)
Albumin: 3 g/dL — ABNORMAL LOW (ref 3.5–5.0)
Alkaline Phosphatase: 86 U/L (ref 38–126)
Anion gap: 7 (ref 5–15)
BUN: 16 mg/dL (ref 8–23)
CO2: 19 mmol/L — ABNORMAL LOW (ref 22–32)
Calcium: 8.3 mg/dL — ABNORMAL LOW (ref 8.9–10.3)
Chloride: 115 mmol/L — ABNORMAL HIGH (ref 98–111)
Creatinine, Ser: 0.81 mg/dL (ref 0.61–1.24)
GFR calc Af Amer: >60 mL/min (ref >60)
GFR calc non Af Amer: >60 mL/min (ref >60)
Glucose, Bld: 92 mg/dL (ref 70–99)
Potassium: 3.4 mmol/L — ABNORMAL LOW (ref 3.5–5.1)
Sodium: 141 mmol/L (ref 135–145)
Total Bilirubin: 3.4 mg/dL — ABNORMAL HIGH (ref 0.3–1.2)
Total Protein: 5.8 g/dL — ABNORMAL LOW (ref 6.5–8.1)

## 2019-05-29 LAB — CBC
HCT: 37.2 % — ABNORMAL LOW (ref 39.0–52.0)
Hemoglobin: 12.6 g/dL — ABNORMAL LOW (ref 13.0–17.0)
MCH: 34.1 pg — ABNORMAL HIGH (ref 26.0–34.0)
MCHC: 33.9 g/dL (ref 30.0–36.0)
MCV: 100.8 fL — ABNORMAL HIGH (ref 80.0–100.0)
Platelets: 75 10*3/uL — ABNORMAL LOW (ref 150–400)
RBC: 3.69 MIL/uL — ABNORMAL LOW (ref 4.22–5.81)
RDW: 15 % (ref 11.5–15.5)
WBC: 5.1 10*3/uL (ref 4.0–10.5)
nRBC: 0 % (ref 0.0–0.2)

## 2019-05-29 LAB — AMMONIA
Ammonia: 91 umol/L — ABNORMAL HIGH (ref 9–35)
Ammonia: 87 umol/L — ABNORMAL HIGH (ref 9–35)

## 2019-05-29 LAB — MRSA PCR SCREENING: MRSA by PCR: NEGATIVE

## 2019-05-29 LAB — HIV ANTIBODY (ROUTINE TESTING W REFLEX): HIV Screen 4th Generation wRfx: NONREACTIVE

## 2019-05-29 LAB — MAGNESIUM: Magnesium: 1.6 mg/dL — ABNORMAL LOW (ref 1.7–2.4)

## 2019-05-29 MED ORDER — LACTULOSE 10 GM/15ML PO SOLN
20.0000 g | Freq: Three times a day (TID) | ORAL | Status: DC
  Administered 2019-05-29 – 2019-06-01 (×8): 20 g via ORAL
  Filled 2019-05-29 (×10): qty 30

## 2019-05-29 MED ORDER — RISAQUAD PO CAPS
1.0000 | ORAL_CAPSULE | Freq: Every day | ORAL | Status: DC
  Administered 2019-05-29 – 2019-06-01 (×4): 1 via ORAL
  Filled 2019-05-29 (×4): qty 1

## 2019-05-29 MED ORDER — POTASSIUM CHLORIDE CRYS ER 20 MEQ PO TBCR
40.0000 meq | EXTENDED_RELEASE_TABLET | Freq: Once | ORAL | Status: AC
  Administered 2019-05-29: 40 meq via ORAL
  Filled 2019-05-29: qty 2

## 2019-05-29 MED ORDER — RIFAXIMIN 550 MG PO TABS
550.0000 mg | ORAL_TABLET | Freq: Two times a day (BID) | ORAL | Status: DC
  Administered 2019-05-29 – 2019-06-01 (×7): 550 mg via ORAL
  Filled 2019-05-29 (×7): qty 1

## 2019-05-29 MED ORDER — MAGNESIUM SULFATE 2 GM/50ML IV SOLN
2.0000 g | Freq: Once | INTRAVENOUS | Status: AC
  Administered 2019-05-29: 2 g via INTRAVENOUS
  Filled 2019-05-29: qty 50

## 2019-05-29 NOTE — Progress Notes (Signed)
PROGRESS NOTE    Cody Austin  SAY:[id number] DOB: 11-01-1950 DOA: 05/28/2019 PCP: [name], MD    Brief Narrative:  69 year old male with a history of Cody Austin cirrhosis, admitted to the hospital with altered mental status.  He was found to have hepatic encephalopathy admitted to the hospital for further management with lactulose and Xifaxan.  Gastroenterology following.   Assessment & Plan:   Active Problems:   Hepatic cirrhosis (HCC)   Acute encephalopathy   Peripheral arterial disease (HCC)   OSA (obstructive sleep apnea)   HLD (hyperlipidemia)   1. Hepatic encephalopathy.  This is patient's first episode of encephalopathy.  Patient presented with altered mental status.  Ammonia on admission 275.  Started on lactulose as well as Xifaxan.  Current ammonia down to 91.  He is less confused, but still has asterixis mental status is not back to baseline.  Continue current treatments.  Since patient is clinically improving with lactulose therapy, I feel that MRI/EEG would be low yield.  These test can be considered if there is any worsening/lack of improvement in mental status. 2. Cody Austin cirrhosis.  GI following.  Ultrasound planned for a.m to screen for portal vein thrombosis.  If he is noted to have significant ascites on ultrasound, he will need paracentesis.  Meld score 15.5. 3. Obstructive sleep apnea.  Continue on CPAP 4. Thrombocytopenia.  Secondary to liver disease.  No signs of bleeding at this time. 5. Hypokalemia/hypomagnesemia.  Replace   DVT prophylaxis: heparin Code Status: full code Family Communication: discussed with wife at the bedside and son/daughter over the phone Disposition Plan: discharge home once ammonia had trended down to normal and mental status is back to baseline   Consultants:   Gastroenterology  Procedures:     Antimicrobials:       Subjective: Patient is more alert today. He denies any shortness of breath  Objective: Vitals:   05/29/19 1200 05/29/19 1500 05/29/19 1544 05/29/19 1600  BP:   (!) 142/70 (!) 148/75  Pulse: 71 76 76 71  Resp: 15 15 18 16   Temp:    98.4 F (36.9 C)  TempSrc:    Oral  SpO2: 99% 97% 99% 100%  Weight:      Height:        Intake/Output Summary (Last 24 hours) at 05/29/2019 1845 Last data filed at 05/29/2019 1653 Gross per 24 hour  Intake 2535.12 ml  Output --  Net 2535.12 ml   Filed Weights   05/28/19 1439 05/28/19 2230  Weight: 113.4 kg 102.2 kg    Examination:  General exam: Appears calm and comfortable  Respiratory system: Clear to auscultation. Respiratory effort normal. Cardiovascular system: S1 & S2 heard, RRR. No JVD, murmurs, rubs, gallops or clicks. No pedal edema. Gastrointestinal system: Abdomen is nondistended, soft and nontender. No organomegaly or masses felt. Normal bowel sounds heard. Central nervous system: Alert and oriented. No focal neurological deficits. +asterixis Extremities: Symmetric 5 x 5 power. Skin: No rashes, lesions or ulcers Psychiatry: pleasant, cooperative with exam, speech is appopriate    Data Reviewed: I have personally reviewed following labs and imaging studies  CBC: Recent Labs  Lab 05/28/19 1438 05/28/19 1549 05/29/19 0424  WBC 2.8*  --  5.1  NEUTROABS 1.6*  --   --   HGB 12.2* 12.2* 12.6*  HCT 36.0* 36.0* 37.2*  MCV 100.6*  --  100.8*  PLT 68*  --  75*   Basic Metabolic Panel: Recent Labs  Lab 05/28/19 1438 05/28/19 1549  05/29/19 0424  NA 139 142 141  K 3.8 3.8 3.4*  CL 113* 109 115*  CO2 21*  --  19*  GLUCOSE 106* 98 92  BUN 16 15 16   CREATININE 0.93 0.90 0.81  CALCIUM 8.1*  --  8.3*  MG  --   --  1.6*   GFR: Estimated Creatinine Clearance: 99.4 mL/min (by C-G formula based on SCr of 0.81 mg/dL). Liver Function Tests: Recent Labs  Lab 05/28/19 1438 05/29/19 0424  AST 48* 51*  ALT 28 30  ALKPHOS 98 86  BILITOT 2.3* 3.4*  PROT 5.5* 5.8*  ALBUMIN 2.9* 3.0*   No results for input(s): LIPASE, AMYLASE in  the last 168 hours. Recent Labs  Lab 05/28/19 1515 05/28/19 2140 05/29/19 0424 05/29/19 1703  AMMONIA 275* 122* 91* 87*   Coagulation Profile: Recent Labs  Lab 05/28/19 1438  INR 1.7*   Cardiac Enzymes: No results for input(s): CKTOTAL, CKMB, CKMBINDEX, TROPONINI in the last 168 hours. BNP (last 3 results) No results for input(s): PROBNP in the last 8760 hours. HbA1C: No results for input(s): HGBA1C in the last 72 hours. CBG: Recent Labs  Lab 05/28/19 1510  GLUCAP 98   Lipid Profile: No results for input(s): CHOL, HDL, LDLCALC, TRIG, CHOLHDL, LDLDIRECT in the last 72 hours. Thyroid Function Tests: No results for input(s): TSH, T4TOTAL, FREET4, T3FREE, THYROIDAB in the last 72 hours. Anemia Panel: No results for input(s): VITAMINB12, FOLATE, FERRITIN, TIBC, IRON, RETICCTPCT in the last 72 hours. Sepsis Labs: No results for input(s): PROCALCITON, LATICACIDVEN in the last 168 hours.  Recent Results (from the past 240 hour(s))  Respiratory Panel by RT PCR (Flu A&B, Covid) - Nasopharyngeal Swab     Status: None   Collection Time: 05/28/19  2:51 PM   Specimen: Nasopharyngeal Swab  Result Value Ref Range Status   SARS Coronavirus 2 by RT PCR NEGATIVE NEGATIVE Final    Comment: (NOTE) SARS-CoV-2 target nucleic acids are NOT DETECTED. The SARS-CoV-2 RNA is generally detectable in upper respiratoy specimens during the acute phase of infection. The lowest concentration of SARS-CoV-2 viral copies this assay can detect is 131 copies/mL. A negative result does not preclude SARS-Cov-2 infection and should not be used as the sole basis for treatment or other patient management decisions. A negative result may occur with  improper specimen collection/handling, submission of specimen other than nasopharyngeal swab, presence of viral mutation(s) within the areas targeted by this assay, and inadequate number of viral copies (<131 copies/mL). A negative result must be combined with  clinical observations, patient history, and epidemiological information. The expected result is Negative. Fact Sheet for Patients:  PinkCheek.be Fact Sheet for Healthcare Providers:  GravelBags.it This test is not yet ap proved or cleared by the [place] FDA and  has been authorized for detection and/or diagnosis of SARS-CoV-2 by FDA under an Emergency Use Authorization (EUA). This EUA will remain  in effect (meaning this test can be used) for the duration of the COVID-19 declaration under Section 564(b)(1) of the Act, 21 U.S.C. section 360bbb-3(b)(1), unless the authorization is terminated or revoked sooner.    Influenza A by PCR NEGATIVE NEGATIVE Final   Influenza B by PCR NEGATIVE NEGATIVE Final    Comment: (NOTE) The Xpert Xpress SARS-CoV-2/FLU/RSV assay is intended as an aid in  the diagnosis of influenza from Nasopharyngeal swab specimens and  should not be used as a sole basis for treatment. Nasal washings and  aspirates are unacceptable for Xpert Xpress SARS-CoV-2/FLU/RSV  testing. Fact Sheet for Patients: PinkCheek.be Fact Sheet for Healthcare Providers: GravelBags.it This test is not yet approved or cleared by the [place] FDA and  has been authorized for detection and/or diagnosis of SARS-CoV-2 by  FDA under an Emergency Use Authorization (EUA). This EUA will remain  in effect (meaning this test can be used) for the duration of the  Covid-19 declaration under Section 564(b)(1) of the Act, 21  U.S.C. section 360bbb-3(b)(1), unless the authorization is  terminated or revoked. Performed at [facility], [address] [place], [place] [address]   MRSA PCR Screening     Status: None   Collection Time: 05/28/19 10:10 PM   Specimen: Nasal Mucosa; Nasopharyngeal  Result Value Ref Range Status   MRSA by PCR NEGATIVE NEGATIVE Final    Comment:          The GeneXpert MRSA Assay (FDA approved for NASAL specimens only), is one component of a comprehensive MRSA colonization surveillance program. It is not intended to diagnose MRSA infection nor to guide or monitor treatment for MRSA infections. Performed at [facility], [address] [place], [place] [address]          Radiology Studies: CT Angio Head W or Wo Contrast  Result Date: 05/28/2019 CLINICAL DATA:  Right-sided weakness. Altered mental status. EXAM: CT ANGIOGRAPHY HEAD AND NECK CT PERFUSION BRAIN TECHNIQUE: Multidetector CT imaging of the head and neck was performed using the standard protocol during bolus administration of intravenous contrast. Multiplanar CT image reconstructions and MIPs were obtained to evaluate the vascular anatomy. Carotid stenosis measurements (when applicable) are obtained utilizing NASCET criteria, using the distal internal carotid diameter as the denominator. Multiphase CT imaging of the brain was performed following IV bolus contrast injection. Subsequent parametric perfusion maps were calculated using RAPID software. CONTRAST:  123m OMNIPAQUE IOHEXOL 350 MG/ML SOLN COMPARISON:  None. FINDINGS: CTA NECK FINDINGS Aortic arch: Normal variant aortic arch branching pattern with common origin of the brachiocephalic and left common carotid arteries. Mild aortic atherosclerosis without significant arch vessel origin stenosis. Right carotid system: Patent with mild calcified plaque at the carotid bifurcation. No evidence of significant stenosis or dissection. Tortuous mid cervical ICA. Left carotid system: Patent with mild-to-moderate calcified plaque in the carotid bulb and minimal plaque in the distal cervical ICA. No evidence of significant stenosis or dissection. Tortuous distal cervical ICA. Vertebral arteries: Patent and codominant with moderate to severe stenosis near the left V3-V4 junction. Skeleton: Cervical spondylosis with uncovertebral spurring  resulting in advanced neural foraminal stenosis on the right at C5-6 and bilaterally at C6-7. At least mild spinal stenosis at C6-7 due to a broad-based posterior disc osteophyte complex. Other neck: No evidence of cervical lymphadenopathy or mass. Upper chest: Clear lung apices. Review of the MIP images confirms the above findings CTA HEAD FINDINGS Anterior circulation: The internal carotid arteries are patent from skull base to carotid termini with calcified plaque resulting in moderate left greater than right cavernous and mild-to-moderate left paraclinoid stenoses. ACAs and MCAs are patent without evidence of proximal branch occlusion or flow limiting proximal stenosis. There is mild right A1 segment irregular narrowing versus artifact. No aneurysm is identified. Posterior circulation: The intracranial vertebral arteries are patent to the basilar with atherosclerosis bilaterally resulting in severe proximal V4 stenosis on the left. The right PICA and left AICA appear dominant. Patent SCA origins are seen bilaterally. The basilar artery is patent and congenitally small in caliber diffusely. There are large posterior communicating arteries with hypoplastic  P1 segments bilaterally. Both PCAs are patent without evidence of flow limiting proximal stenosis. No aneurysm is identified. Venous sinuses: Patent. Anatomic variants: Predominantly fetal type origin of the PCAs. Review of the MIP images confirms the above findings CT Brain Perfusion Findings: ASPECTS: 10 CBF (<30%) Volume: 0 mL Perfusion (Tmax>6.0s) volume: 81 mL, primarily localizing to the superior cerebellum, brainstem, and right greater than left temporo-occipital regions and felt to reflect artifact Mismatch Volume: Not applicable given suspected artifact described above Infarction Location: Not applicable IMPRESSION: 1. No large vessel occlusion. 2. Intracranial atherosclerosis with moderate bilateral ICA and severe left V4 stenoses. 3. Mild cervical  carotid artery atherosclerosis without stenosis. 4. No core infarct or definite abnormal perfusion identified on CTP. 5.  Aortic Atherosclerosis (ICD10-I70.0). The preliminary finding of no LVO was communicated via telephone to Dr. Ezequiel Essex core on 05/28/2019 at 2:58 pm. Electronically Signed   By: Logan Bores M.D.   On: 05/28/2019 15:46   CT Angio Neck W and/or Wo Contrast  Result Date: 05/28/2019 CLINICAL DATA:  Right-sided weakness. Altered mental status. EXAM: CT ANGIOGRAPHY HEAD AND NECK CT PERFUSION BRAIN TECHNIQUE: Multidetector CT imaging of the head and neck was performed using the standard protocol during bolus administration of intravenous contrast. Multiplanar CT image reconstructions and MIPs were obtained to evaluate the vascular anatomy. Carotid stenosis measurements (when applicable) are obtained utilizing NASCET criteria, using the distal internal carotid diameter as the denominator. Multiphase CT imaging of the brain was performed following IV bolus contrast injection. Subsequent parametric perfusion maps were calculated using RAPID software. CONTRAST:  154m OMNIPAQUE IOHEXOL 350 MG/ML SOLN COMPARISON:  None. FINDINGS: CTA NECK FINDINGS Aortic arch: Normal variant aortic arch branching pattern with common origin of the brachiocephalic and left common carotid arteries. Mild aortic atherosclerosis without significant arch vessel origin stenosis. Right carotid system: Patent with mild calcified plaque at the carotid bifurcation. No evidence of significant stenosis or dissection. Tortuous mid cervical ICA. Left carotid system: Patent with mild-to-moderate calcified plaque in the carotid bulb and minimal plaque in the distal cervical ICA. No evidence of significant stenosis or dissection. Tortuous distal cervical ICA. Vertebral arteries: Patent and codominant with moderate to severe stenosis near the left V3-V4 junction. Skeleton: Cervical spondylosis with uncovertebral spurring resulting in  advanced neural foraminal stenosis on the right at C5-6 and bilaterally at C6-7. At least mild spinal stenosis at C6-7 due to a broad-based posterior disc osteophyte complex. Other neck: No evidence of cervical lymphadenopathy or mass. Upper chest: Clear lung apices. Review of the MIP images confirms the above findings CTA HEAD FINDINGS Anterior circulation: The internal carotid arteries are patent from skull base to carotid termini with calcified plaque resulting in moderate left greater than right cavernous and mild-to-moderate left paraclinoid stenoses. ACAs and MCAs are patent without evidence of proximal branch occlusion or flow limiting proximal stenosis. There is mild right A1 segment irregular narrowing versus artifact. No aneurysm is identified. Posterior circulation: The intracranial vertebral arteries are patent to the basilar with atherosclerosis bilaterally resulting in severe proximal V4 stenosis on the left. The right PICA and left AICA appear dominant. Patent SCA origins are seen bilaterally. The basilar artery is patent and congenitally small in caliber diffusely. There are large posterior communicating arteries with hypoplastic P1 segments bilaterally. Both PCAs are patent without evidence of flow limiting proximal stenosis. No aneurysm is identified. Venous sinuses: Patent. Anatomic variants: Predominantly fetal type origin of the PCAs. Review of the MIP images confirms the above findings CT  Brain Perfusion Findings: ASPECTS: 10 CBF (<30%) Volume: 0 mL Perfusion (Tmax>6.0s) volume: 81 mL, primarily localizing to the superior cerebellum, brainstem, and right greater than left temporo-occipital regions and felt to reflect artifact Mismatch Volume: Not applicable given suspected artifact described above Infarction Location: Not applicable IMPRESSION: 1. No large vessel occlusion. 2. Intracranial atherosclerosis with moderate bilateral ICA and severe left V4 stenoses. 3. Mild cervical carotid artery  atherosclerosis without stenosis. 4. No core infarct or definite abnormal perfusion identified on CTP. 5.  Aortic Atherosclerosis (ICD10-I70.0). The preliminary finding of no LVO was communicated via telephone to Dr. Ezequiel Essex core on 05/28/2019 at 2:58 pm. Electronically Signed   By: Logan Bores M.D.   On: 05/28/2019 15:46   CT CEREBRAL PERFUSION W CONTRAST  Result Date: 05/28/2019 CLINICAL DATA:  Right-sided weakness. Altered mental status. EXAM: CT ANGIOGRAPHY HEAD AND NECK CT PERFUSION BRAIN TECHNIQUE: Multidetector CT imaging of the head and neck was performed using the standard protocol during bolus administration of intravenous contrast. Multiplanar CT image reconstructions and MIPs were obtained to evaluate the vascular anatomy. Carotid stenosis measurements (when applicable) are obtained utilizing NASCET criteria, using the distal internal carotid diameter as the denominator. Multiphase CT imaging of the brain was performed following IV bolus contrast injection. Subsequent parametric perfusion maps were calculated using RAPID software. CONTRAST:  126m OMNIPAQUE IOHEXOL 350 MG/ML SOLN COMPARISON:  None. FINDINGS: CTA NECK FINDINGS Aortic arch: Normal variant aortic arch branching pattern with common origin of the brachiocephalic and left common carotid arteries. Mild aortic atherosclerosis without significant arch vessel origin stenosis. Right carotid system: Patent with mild calcified plaque at the carotid bifurcation. No evidence of significant stenosis or dissection. Tortuous mid cervical ICA. Left carotid system: Patent with mild-to-moderate calcified plaque in the carotid bulb and minimal plaque in the distal cervical ICA. No evidence of significant stenosis or dissection. Tortuous distal cervical ICA. Vertebral arteries: Patent and codominant with moderate to severe stenosis near the left V3-V4 junction. Skeleton: Cervical spondylosis with uncovertebral spurring resulting in advanced neural  foraminal stenosis on the right at C5-6 and bilaterally at C6-7. At least mild spinal stenosis at C6-7 due to a broad-based posterior disc osteophyte complex. Other neck: No evidence of cervical lymphadenopathy or mass. Upper chest: Clear lung apices. Review of the MIP images confirms the above findings CTA HEAD FINDINGS Anterior circulation: The internal carotid arteries are patent from skull base to carotid termini with calcified plaque resulting in moderate left greater than right cavernous and mild-to-moderate left paraclinoid stenoses. ACAs and MCAs are patent without evidence of proximal branch occlusion or flow limiting proximal stenosis. There is mild right A1 segment irregular narrowing versus artifact. No aneurysm is identified. Posterior circulation: The intracranial vertebral arteries are patent to the basilar with atherosclerosis bilaterally resulting in severe proximal V4 stenosis on the left. The right PICA and left AICA appear dominant. Patent SCA origins are seen bilaterally. The basilar artery is patent and congenitally small in caliber diffusely. There are large posterior communicating arteries with hypoplastic P1 segments bilaterally. Both PCAs are patent without evidence of flow limiting proximal stenosis. No aneurysm is identified. Venous sinuses: Patent. Anatomic variants: Predominantly fetal type origin of the PCAs. Review of the MIP images confirms the above findings CT Brain Perfusion Findings: ASPECTS: 10 CBF (<30%) Volume: 0 mL Perfusion (Tmax>6.0s) volume: 81 mL, primarily localizing to the superior cerebellum, brainstem, and right greater than left temporo-occipital regions and felt to reflect artifact Mismatch Volume: Not applicable given suspected artifact described  above Infarction Location: Not applicable IMPRESSION: 1. No large vessel occlusion. 2. Intracranial atherosclerosis with moderate bilateral ICA and severe left V4 stenoses. 3. Mild cervical carotid artery atherosclerosis  without stenosis. 4. No core infarct or definite abnormal perfusion identified on CTP. 5.  Aortic Atherosclerosis (ICD10-I70.0). The preliminary finding of no LVO was communicated via telephone to Dr. Ezequiel Essex core on 05/28/2019 at 2:58 pm. Electronically Signed   By: Logan Bores M.D.   On: 05/28/2019 15:46   CT HEAD CODE STROKE WO CONTRAST  Result Date: 05/28/2019 CLINICAL DATA:  Code stroke. Right-sided weakness. Altered mental status. EXAM: CT HEAD WITHOUT CONTRAST TECHNIQUE: Contiguous axial images were obtained from the base of the skull through the vertex without intravenous contrast. COMPARISON:  None. FINDINGS: Brain: The study is mildly motion degraded despite repeat imaging. Within this limitation, no acute infarct, intracranial hemorrhage, mass, midline shift, or extra-axial fluid collection is identified. The ventricles and sulci are within normal limits for age. Hypodensities in the cerebral white matter bilaterally are nonspecific but compatible with mild chronic small vessel ischemic disease. Vascular: Calcified atherosclerosis at the skull base. No hyperdense vessel. Skull: No fracture or suspicious osseous lesion. Sinuses/Orbits: Visualized paranasal sinuses and mastoid air cells are clear. Bilateral cataract extraction is noted. Other: None. ASPECTS [facility] Stroke Program Early CT Score) - Ganglionic level infarction (caudate, lentiform nuclei, internal capsule, insula, M1-M3 cortex): 7 - Supraganglionic infarction (M4-M6 cortex): 3 Total score (0-10 with 10 being normal): 10 IMPRESSION: 1. No evidence of acute intracranial abnormality. 2. ASPECTS is 10. 3. Mild chronic small vessel ischemic disease. These results were called by telephone at the time of interpretation on 05/28/2019 at 2:58 pm to Dr. Ezequiel Essex , who verbally acknowledged these results. Electronically Signed   By: Logan Bores M.D.   On: 05/28/2019 15:04        Scheduled Meds: . acidophilus  1 capsule Oral  Daily  . Chlorhexidine Gluconate Cloth  6 each Topical Daily  . heparin  5,000 Units Subcutaneous Q8H  . lactulose  20 g Oral TID  . lactulose  300 mL Rectal BID  . mouth rinse  15 mL Mouth Rinse BID  . rifaximin  550 mg Oral BID   Continuous Infusions:   LOS: 1 day    Time spent: 33mns    JKathie Dike MD [facility]   If 7PM-7AM, please contact night-coverage [email or web address]  05/29/2019, 6:45 PM

## 2019-05-29 NOTE — Evaluation (Signed)
Clinical/Bedside Swallow Evaluation Patient Details  Name: Cody Austin MRN: [id number] Date of Birth: November 28, 1950  Today's Date: 05/29/2019 Time: SLP Start Time (ACUTE ONLY): 7408 SLP Stop Time (ACUTE ONLY): 1354 SLP Time Calculation (min) (ACUTE ONLY): 27 min  Past Medical History:  Past Medical History:  Diagnosis Date  . Arthritis   . Chronic kidney disease    left renal stone  . Cirrhosis, non-alcoholic ([place])   . Diabetes mellitus without complication ([place])    Pre-Diabetes per MD  . Fatty liver   . Heart murmur   . Other pancytopenia ([place]) 11/19/2014  . Peripheral arterial disease ([place]) 05/28/2019  . Sleep apnea    uses CPAP   Past Surgical History:  Past Surgical History:  Procedure Laterality Date  . CATARACT EXTRACTION W/PHACO Right 08/12/2014   Procedure: CATARACT EXTRACTION PHACO AND INTRAOCULAR LENS PLACEMENT (IOC);  Surgeon: [name], MD;  Location: [facility];  Service: Ophthalmology;  Laterality: Right;  CDE 9.32  . CATARACT EXTRACTION W/PHACO Left 08/26/2014   Procedure: CATARACT EXTRACTION PHACO AND INTRAOCULAR LENS PLACEMENT (IOC);  Surgeon: [name], MD;  Location: [facility];  Service: Ophthalmology;  Laterality: Left;  CDE:  9.49  . COLONOSCOPY N/A 12/13/2014   Procedure: COLONOSCOPY;  Surgeon: Rogene Houston, MD;  Location: [facility];  Service: Endoscopy;  Laterality: N/A;  100  . ESOPHAGOGASTRODUODENOSCOPY N/A 12/13/2014   Procedure: ESOPHAGOGASTRODUODENOSCOPY (EGD);  Surgeon: Rogene Houston, MD;  Location: [facility];  Service: Endoscopy;  Laterality: N/A;  . HERNIA REPAIR Right    Inguinal hernia   HPI:  Cody Austin is a 69 y.o. male with medical history significant of CKD, Cirrhosis, DM, OSA, pancytopenia. BSE requested by Dr. Laural Golden as Pt was coughing while drinking water this AM.    Assessment / Plan / Recommendation Clinical Impression  Clinical swallow evaluation completed while Pt seated in recliner with Pt's wife present. Pt denies difficulty  swallowing, however his wife indicates occasional coughing when he eats or drinks too fast. Pt's vocal quality sounds hyponasal. Oral motor examination is WNL, unable to visualize velum. Pt with cued strong, mildly congested cough and able to swallow upon request. Pt assessed with ice chips, water via cup/staw, puree, and regular textures. Pt without overt signs or symptoms of aspiration and consumed 240 cc thin water without incident. Pt was cued to refrain from talking while masticating regular textures. Suspect Pt may have difficulty coordinating respiration and swallow exacerbated when talking during po intake. SLP explained that this increases risk for aspiration. Recommend regular textures and thin liquids with aspiration precautions (small bites/sips, eat slowly, avoid talking during po intake, swallow 2x for each sip, and sit upright for all eating/drinking and remain upright for 30 minutes after). Pt and spouse are in agreement with recommendations. Will defer to GI to advance to regular textures (he is currently on full liquids) and SLP will sign off. Reconsult if indicated.    SLP Visit Diagnosis: Dysphagia, unspecified (R13.10)    Aspiration Risk  Mild aspiration risk    Diet Recommendation Regular;Thin liquid   Liquid Administration via: Cup;Straw Medication Administration: Whole meds with liquid Supervision: Patient able to self feed Compensations: Slow rate;Small sips/bites;Multiple dry swallows after each bite/sip Postural Changes: Seated upright at 90 degrees;Remain upright for at least 30 minutes after po intake    Other  Recommendations Oral Care Recommendations: Oral care BID;Staff/trained caregiver to provide oral care Other Recommendations: Clarify dietary restrictions   Follow up Recommendations None  Frequency and Duration            Prognosis Prognosis for Safe Diet Advancement: Good      Swallow Study   General Date of Onset: 05/28/19 HPI: Cody Austin  is a 69 y.o. male with medical history significant of CKD, Cirrhosis, DM, OSA, pancytopenia. BSE requested by Dr. Laural Golden as Pt was coughing while drinking water this AM.  Type of Study: Bedside Swallow Evaluation Previous Swallow Assessment: None on record Diet Prior to this Study: (full liquids) Temperature Spikes Noted: No Respiratory Status: Room air History of Recent Intubation: No Behavior/Cognition: Alert;Cooperative;Pleasant mood Oral Cavity Assessment: Within Functional Limits(some tear/skin on tongue which Pt states he's had for a whil) Oral Care Completed by SLP: Recent completion by staff Oral Cavity - Dentition: Adequate natural dentition Vision: Functional for self-feeding Self-Feeding Abilities: Able to feed self;Needs set up(tremor) Patient Positioning: Upright in chair Baseline Vocal Quality: Normal(hyponasal) Volitional Cough: Strong;Congested Volitional Swallow: Able to elicit    Oral/Motor/Sensory Function Overall Oral Motor/Sensory Function: Within functional limits   Ice Chips Ice chips: Within functional limits Presentation: Spoon   Thin Liquid Thin Liquid: Within functional limits Presentation: Cup;Self Fed;Straw    Nectar Thick Nectar Thick Liquid: Not tested   Honey Thick Honey Thick Liquid: Not tested   Puree Puree: Within functional limits Presentation: Spoon   Solid     Solid: Within functional limits Presentation: Self Fed(cues to avoid talking while eating)     Thank you,  Genene Churn, [place] 05/29/2019,2:01 PM

## 2019-05-29 NOTE — Consult Note (Signed)
Referring Provider: [name], MD Primary care physician:  [name], MD Primary Gastroenterologist:  Dr. Laural Golden  Reason for Consultation:   Hepatic encephalopathy.  HPI:   History is obtained from patient's chart and Mrs. Gupton.  Patient is 69 year old Caucasian male who has a history of cirrhosis secondary to [name] diagnosed back in August 2016.  He has remained with well-preserved hepatic function.  He is up-to-date on screening for hepatocellular carcinoma.  Last EGD was in August 2016 revealing mild portal hypertensive gastropathy and erosive gastroduodenitis but no evidence of esophageal or gastric varices. Patient was in usual state of health until last swallowing when he was diagnosed with Covid.  He had mild symptoms.  His wife states that he has been sluggish ever since.  She also noted him to have tremors to both hands.  He has been otherwise doing well.  They both went to [place] 2 days ago and he was driving and had no problems. Yesterday morning she thought he was doing all right.  He did not eat his breakfast.  He does not eat breakfast on Monday morning.  Then around 2 PM he was confused and walking in the house without his clothes.  That is when 9 1 was called and patient was brought to emergency room.  He also had slurred speech.  There was concern for acute CVA.  He underwent multiple studies and acute CVA was ruled out.  Because of history of cirrhosis serum ammonia was checked and was high at 275(normal up to 35).  Therefore patient was diagnosed with hepatic encephalopathy and admitted to ICU.  He was begun on lactulose enemas. According to his wife he has not had any rectal bleeding melena nausea vomiting fever chills or abdominal pain. Patient is denies headache shortness of breath chest pain or abdominal pain.  He states he has not been very active lately because of Covid.  No history of hematuria or dysuria  Past Medical History:  Diagnosis Date  . Arthritis    . Chronic kidney disease    left renal stone  . Cirrhosis, non-alcoholic ([place])   . Diabetes mellitus without complication ([place])    Pre-Diabetes per MD  . Fatty liver   . Heart murmur   . Other pancytopenia ([place]) 11/19/2014  . Peripheral arterial disease ([place]) 05/28/2019  . Sleep apnea    uses CPAP    Past Surgical History:  Procedure Laterality Date  . CATARACT EXTRACTION W/PHACO Right 08/12/2014   Procedure: CATARACT EXTRACTION PHACO AND INTRAOCULAR LENS PLACEMENT (IOC);  Surgeon: [name], MD;  Location: [facility];  Service: Ophthalmology;  Laterality: Right;  CDE 9.32  . CATARACT EXTRACTION W/PHACO Left 08/26/2014   Procedure: CATARACT EXTRACTION PHACO AND INTRAOCULAR LENS PLACEMENT (IOC);  Surgeon: [name], MD;  Location: [facility];  Service: Ophthalmology;  Laterality: Left;  CDE:  9.49  . COLONOSCOPY N/A 12/13/2014   Procedure: COLONOSCOPY;  Surgeon: Rogene Houston, MD;  Location: [facility];  Service: Endoscopy;  Laterality: N/A;  100  . ESOPHAGOGASTRODUODENOSCOPY N/A 12/13/2014   Procedure: ESOPHAGOGASTRODUODENOSCOPY (EGD);  Surgeon: Rogene Houston, MD;  Location: [facility];  Service: Endoscopy;  Laterality: N/A;  . HERNIA REPAIR Right    Inguinal hernia    Prior to Admission medications   Medication Sig Start Date End Date Taking? Authorizing Provider  atorvastatin (LIPITOR) 20 MG tablet Take 20 mg by mouth daily.  08/25/17  Yes [provider]  zinc gluconate 50 MG tablet Take 50  mg by mouth daily.   Yes [provider]    Current Facility-Administered Medications  Medication Dose Route Frequency Provider Last Rate Last Admin  . 0.9 %  sodium chloride infusion   Intravenous Continuous Waldemar Dickens, MD 150 mL/hr at 05/29/19 0258 New Bag at 05/29/19 0258  . acetaminophen (TYLENOL) tablet 650 mg  650 mg Oral Q6H PRN Waldemar Dickens, MD       Or  . acetaminophen (TYLENOL) suppository 650 mg  650 mg Rectal Q6H PRN Waldemar Dickens, MD      .  Chlorhexidine Gluconate Cloth 2 % PADS 6 each  6 each Topical Daily Waldemar Dickens, MD      . heparin injection 5,000 Units  5,000 Units Subcutaneous Q8H Waldemar Dickens, MD   5,000 Units at 05/29/19 0557  . hydrALAZINE (APRESOLINE) injection 5 mg  5 mg Intravenous Q2H PRN Waldemar Dickens, MD   5 mg at 05/29/19 0443  . lactulose (CHRONULAC) enema 200 gm  300 mL Rectal BID Waldemar Dickens, MD      . LORazepam (ATIVAN) injection 1-2 mg  1-2 mg Intravenous Q2H PRN Waldemar Dickens, MD      . MEDLINE mouth rinse  15 mL Mouth Rinse BID Waldemar Dickens, MD   15 mL at 05/29/19 0012  . traZODone (DESYREL) tablet 100 mg  100 mg Oral QHS PRN Waldemar Dickens, MD        Allergies as of 05/28/2019  . (No Known Allergies)    Family History  Problem Relation Age of Onset  . Cancer Father   . Aneurysm Father     Social History   Socioeconomic History  . Marital status: Married    Spouse name: Not on file  . Number of children: Not on file  . Years of education: Not on file  . Highest education level: Not on file  Occupational History  . Not on file  Tobacco Use  . Smoking status: Never Smoker  . Smokeless tobacco: Never Used  Substance and Sexual Activity  . Alcohol use: Yes    Alcohol/week: 0.0 standard drinks    Comment: occ  . Drug use: No  . Sexual activity: Not on file  Other Topics Concern  . Not on file  Social History Narrative  . Not on file   Social Determinants of Health   Financial Resource Strain:   . Difficulty of Paying Living Expenses: Not on file  Food Insecurity:   . Worried About Charity fundraiser in the Last Year: Not on file  . Ran Out of Food in the Last Year: Not on file  Transportation Needs:   . Lack of Transportation (Medical): Not on file  . Lack of Transportation (Non-Medical): Not on file  Physical Activity:   . Days of Exercise per Week: Not on file  . Minutes of Exercise per Session: Not on file  Stress:   . Feeling of Stress : Not on  file  Social Connections:   . Frequency of Communication with Friends and Family: Not on file  . Frequency of Social Gatherings with Friends and Family: Not on file  . Attends Religious Services: Not on file  . Active Member of Clubs or Organizations: Not on file  . Attends Archivist Meetings: Not on file  . Marital Status: Not on file  Intimate Partner Violence:   . Fear of Current or Ex-Partner: Not on file  .  Emotionally Abused: Not on file  . Physically Abused: Not on file  . Sexually Abused: Not on file    Review of Systems: See HPI, otherwise normal ROS  Physical Exam: Temp:  [96.4 F (35.8 C)-98 F (36.7 C)] 97.8 F (36.6 C) (02/09 0733) Pulse Rate:  [87-107] 95 (02/09 0733) Resp:  [14-21] 14 (02/09 0733) BP: (115-184)/(54-116) 115/54 (02/09 0530) SpO2:  [96 %-100 %] 99 % (02/09 0733) Weight:  [102.2 kg-113.4 kg] 102.2 kg (02/08 2230) Last BM Date: (unknown)   Patient is awake and responds appropriate to questions.  He knows he is in ICU [facility] but he does not know the date date or month. His speech is not slurred but it sounds like that of a person who has nasal congestion. He has asterixis. Conjunctivae is pink.  Sclerae nonicteric. Pupils are equal and reactive to light. Oropharyngeal mucosa is normal. No neck masses thyromegaly or lymphadenopathy noted. Cardiac exam with regular rhythm normal S1 and S2.  No murmur gallop noted. Auscultation lungs reveal vesicular breath sounds bilaterally.  No rales or rhonchi noted. Abdomen is full.  Bowel sounds are normal.  On palpation is soft and nontender without organomegaly or masses.  Flanks are not dull or bulging. He does not have peripheral edema or clubbing.    Lab Results: Recent Labs    05/28/19 1438 05/28/19 1549 05/29/19 0424  WBC 2.8*  --  5.1  HGB 12.2* 12.2* 12.6*  HCT 36.0* 36.0* 37.2*  PLT 68*  --  75*   BMET Recent Labs    05/28/19 1438 05/28/19 1549 05/29/19 0424  NA 139  142 141  K 3.8 3.8 3.4*  CL 113* 109 115*  CO2 21*  --  19*  GLUCOSE 106* 98 92  BUN 16 15 16   CREATININE 0.93 0.90 0.81  CALCIUM 8.1*  --  8.3*   LFT Recent Labs    05/29/19 0424  PROT 5.8*  ALBUMIN 3.0*  AST 51*  ALT 30  ALKPHOS 86  BILITOT 3.4*   PT/INR Recent Labs    05/28/19 1438  LABPROT 19.5*  INR 1.7*   Hepatitis Panel No results for input(s): HEPBSAG, HCVAB, HEPAIGM, HEPBIGM in the last 72 hours.  Studies/Results: CT Angio Head W or Wo Contrast  Result Date: 05/28/2019 CLINICAL DATA:  Right-sided weakness. Altered mental status. EXAM: CT ANGIOGRAPHY HEAD AND NECK CT PERFUSION BRAIN TECHNIQUE: Multidetector CT imaging of the head and neck was performed using the standard protocol during bolus administration of intravenous contrast. Multiplanar CT image reconstructions and MIPs were obtained to evaluate the vascular anatomy. Carotid stenosis measurements (when applicable) are obtained utilizing NASCET criteria, using the distal internal carotid diameter as the denominator. Multiphase CT imaging of the brain was performed following IV bolus contrast injection. Subsequent parametric perfusion maps were calculated using RAPID software. CONTRAST:  1101m OMNIPAQUE IOHEXOL 350 MG/ML SOLN COMPARISON:  None. FINDINGS: CTA NECK FINDINGS Aortic arch: Normal variant aortic arch branching pattern with common origin of the brachiocephalic and left common carotid arteries. Mild aortic atherosclerosis without significant arch vessel origin stenosis. Right carotid system: Patent with mild calcified plaque at the carotid bifurcation. No evidence of significant stenosis or dissection. Tortuous mid cervical ICA. Left carotid system: Patent with mild-to-moderate calcified plaque in the carotid bulb and minimal plaque in the distal cervical ICA. No evidence of significant stenosis or dissection. Tortuous distal cervical ICA. Vertebral arteries: Patent and codominant with moderate to severe stenosis  near the left V3-V4  junction. Skeleton: Cervical spondylosis with uncovertebral spurring resulting in advanced neural foraminal stenosis on the right at C5-6 and bilaterally at C6-7. At least mild spinal stenosis at C6-7 due to a broad-based posterior disc osteophyte complex. Other neck: No evidence of cervical lymphadenopathy or mass. Upper chest: Clear lung apices. Review of the MIP images confirms the above findings CTA HEAD FINDINGS Anterior circulation: The internal carotid arteries are patent from skull base to carotid termini with calcified plaque resulting in moderate left greater than right cavernous and mild-to-moderate left paraclinoid stenoses. ACAs and MCAs are patent without evidence of proximal branch occlusion or flow limiting proximal stenosis. There is mild right A1 segment irregular narrowing versus artifact. No aneurysm is identified. Posterior circulation: The intracranial vertebral arteries are patent to the basilar with atherosclerosis bilaterally resulting in severe proximal V4 stenosis on the left. The right PICA and left AICA appear dominant. Patent SCA origins are seen bilaterally. The basilar artery is patent and congenitally small in caliber diffusely. There are large posterior communicating arteries with hypoplastic P1 segments bilaterally. Both PCAs are patent without evidence of flow limiting proximal stenosis. No aneurysm is identified. Venous sinuses: Patent. Anatomic variants: Predominantly fetal type origin of the PCAs. Review of the MIP images confirms the above findings CT Brain Perfusion Findings: ASPECTS: 10 CBF (<30%) Volume: 0 mL Perfusion (Tmax>6.0s) volume: 81 mL, primarily localizing to the superior cerebellum, brainstem, and right greater than left temporo-occipital regions and felt to reflect artifact Mismatch Volume: Not applicable given suspected artifact described above Infarction Location: Not applicable IMPRESSION: 1. No large vessel occlusion. 2. Intracranial  atherosclerosis with moderate bilateral ICA and severe left V4 stenoses. 3. Mild cervical carotid artery atherosclerosis without stenosis. 4. No core infarct or definite abnormal perfusion identified on CTP. 5.  Aortic Atherosclerosis (ICD10-I70.0). The preliminary finding of no LVO was communicated via telephone to Dr. Ezequiel Essex core on 05/28/2019 at 2:58 pm. Electronically Signed   By: Logan Bores M.D.   On: 05/28/2019 15:46   CT Angio Neck W and/or Wo Contrast  Result Date: 05/28/2019 CLINICAL DATA:  Right-sided weakness. Altered mental status. EXAM: CT ANGIOGRAPHY HEAD AND NECK CT PERFUSION BRAIN TECHNIQUE: Multidetector CT imaging of the head and neck was performed using the standard protocol during bolus administration of intravenous contrast. Multiplanar CT image reconstructions and MIPs were obtained to evaluate the vascular anatomy. Carotid stenosis measurements (when applicable) are obtained utilizing NASCET criteria, using the distal internal carotid diameter as the denominator. Multiphase CT imaging of the brain was performed following IV bolus contrast injection. Subsequent parametric perfusion maps were calculated using RAPID software. CONTRAST:  112m OMNIPAQUE IOHEXOL 350 MG/ML SOLN COMPARISON:  None. FINDINGS: CTA NECK FINDINGS Aortic arch: Normal variant aortic arch branching pattern with common origin of the brachiocephalic and left common carotid arteries. Mild aortic atherosclerosis without significant arch vessel origin stenosis. Right carotid system: Patent with mild calcified plaque at the carotid bifurcation. No evidence of significant stenosis or dissection. Tortuous mid cervical ICA. Left carotid system: Patent with mild-to-moderate calcified plaque in the carotid bulb and minimal plaque in the distal cervical ICA. No evidence of significant stenosis or dissection. Tortuous distal cervical ICA. Vertebral arteries: Patent and codominant with moderate to severe stenosis near the  left V3-V4 junction. Skeleton: Cervical spondylosis with uncovertebral spurring resulting in advanced neural foraminal stenosis on the right at C5-6 and bilaterally at C6-7. At least mild spinal stenosis at C6-7 due to a broad-based posterior disc osteophyte complex. Other neck: No  evidence of cervical lymphadenopathy or mass. Upper chest: Clear lung apices. Review of the MIP images confirms the above findings CTA HEAD FINDINGS Anterior circulation: The internal carotid arteries are patent from skull base to carotid termini with calcified plaque resulting in moderate left greater than right cavernous and mild-to-moderate left paraclinoid stenoses. ACAs and MCAs are patent without evidence of proximal branch occlusion or flow limiting proximal stenosis. There is mild right A1 segment irregular narrowing versus artifact. No aneurysm is identified. Posterior circulation: The intracranial vertebral arteries are patent to the basilar with atherosclerosis bilaterally resulting in severe proximal V4 stenosis on the left. The right PICA and left AICA appear dominant. Patent SCA origins are seen bilaterally. The basilar artery is patent and congenitally small in caliber diffusely. There are large posterior communicating arteries with hypoplastic P1 segments bilaterally. Both PCAs are patent without evidence of flow limiting proximal stenosis. No aneurysm is identified. Venous sinuses: Patent. Anatomic variants: Predominantly fetal type origin of the PCAs. Review of the MIP images confirms the above findings CT Brain Perfusion Findings: ASPECTS: 10 CBF (<30%) Volume: 0 mL Perfusion (Tmax>6.0s) volume: 81 mL, primarily localizing to the superior cerebellum, brainstem, and right greater than left temporo-occipital regions and felt to reflect artifact Mismatch Volume: Not applicable given suspected artifact described above Infarction Location: Not applicable IMPRESSION: 1. No large vessel occlusion. 2. Intracranial  atherosclerosis with moderate bilateral ICA and severe left V4 stenoses. 3. Mild cervical carotid artery atherosclerosis without stenosis. 4. No core infarct or definite abnormal perfusion identified on CTP. 5.  Aortic Atherosclerosis (ICD10-I70.0). The preliminary finding of no LVO was communicated via telephone to Dr. Ezequiel Essex core on 05/28/2019 at 2:58 pm. Electronically Signed   By: Logan Bores M.D.   On: 05/28/2019 15:46   CT CEREBRAL PERFUSION W CONTRAST  Result Date: 05/28/2019 CLINICAL DATA:  Right-sided weakness. Altered mental status. EXAM: CT ANGIOGRAPHY HEAD AND NECK CT PERFUSION BRAIN TECHNIQUE: Multidetector CT imaging of the head and neck was performed using the standard protocol during bolus administration of intravenous contrast. Multiplanar CT image reconstructions and MIPs were obtained to evaluate the vascular anatomy. Carotid stenosis measurements (when applicable) are obtained utilizing NASCET criteria, using the distal internal carotid diameter as the denominator. Multiphase CT imaging of the brain was performed following IV bolus contrast injection. Subsequent parametric perfusion maps were calculated using RAPID software. CONTRAST:  115m OMNIPAQUE IOHEXOL 350 MG/ML SOLN COMPARISON:  None. FINDINGS: CTA NECK FINDINGS Aortic arch: Normal variant aortic arch branching pattern with common origin of the brachiocephalic and left common carotid arteries. Mild aortic atherosclerosis without significant arch vessel origin stenosis. Right carotid system: Patent with mild calcified plaque at the carotid bifurcation. No evidence of significant stenosis or dissection. Tortuous mid cervical ICA. Left carotid system: Patent with mild-to-moderate calcified plaque in the carotid bulb and minimal plaque in the distal cervical ICA. No evidence of significant stenosis or dissection. Tortuous distal cervical ICA. Vertebral arteries: Patent and codominant with moderate to severe stenosis near the left  V3-V4 junction. Skeleton: Cervical spondylosis with uncovertebral spurring resulting in advanced neural foraminal stenosis on the right at C5-6 and bilaterally at C6-7. At least mild spinal stenosis at C6-7 due to a broad-based posterior disc osteophyte complex. Other neck: No evidence of cervical lymphadenopathy or mass. Upper chest: Clear lung apices. Review of the MIP images confirms the above findings CTA HEAD FINDINGS Anterior circulation: The internal carotid arteries are patent from skull base to carotid termini with calcified plaque resulting in  moderate left greater than right cavernous and mild-to-moderate left paraclinoid stenoses. ACAs and MCAs are patent without evidence of proximal branch occlusion or flow limiting proximal stenosis. There is mild right A1 segment irregular narrowing versus artifact. No aneurysm is identified. Posterior circulation: The intracranial vertebral arteries are patent to the basilar with atherosclerosis bilaterally resulting in severe proximal V4 stenosis on the left. The right PICA and left AICA appear dominant. Patent SCA origins are seen bilaterally. The basilar artery is patent and congenitally small in caliber diffusely. There are large posterior communicating arteries with hypoplastic P1 segments bilaterally. Both PCAs are patent without evidence of flow limiting proximal stenosis. No aneurysm is identified. Venous sinuses: Patent. Anatomic variants: Predominantly fetal type origin of the PCAs. Review of the MIP images confirms the above findings CT Brain Perfusion Findings: ASPECTS: 10 CBF (<30%) Volume: 0 mL Perfusion (Tmax>6.0s) volume: 81 mL, primarily localizing to the superior cerebellum, brainstem, and right greater than left temporo-occipital regions and felt to reflect artifact Mismatch Volume: Not applicable given suspected artifact described above Infarction Location: Not applicable IMPRESSION: 1. No large vessel occlusion. 2. Intracranial atherosclerosis  with moderate bilateral ICA and severe left V4 stenoses. 3. Mild cervical carotid artery atherosclerosis without stenosis. 4. No core infarct or definite abnormal perfusion identified on CTP. 5.  Aortic Atherosclerosis (ICD10-I70.0). The preliminary finding of no LVO was communicated via telephone to Dr. Ezequiel Essex core on 05/28/2019 at 2:58 pm. Electronically Signed   By: Logan Bores M.D.   On: 05/28/2019 15:46   CT HEAD CODE STROKE WO CONTRAST  Result Date: 05/28/2019 CLINICAL DATA:  Code stroke. Right-sided weakness. Altered mental status. EXAM: CT HEAD WITHOUT CONTRAST TECHNIQUE: Contiguous axial images were obtained from the base of the skull through the vertex without intravenous contrast. COMPARISON:  None. FINDINGS: Brain: The study is mildly motion degraded despite repeat imaging. Within this limitation, no acute infarct, intracranial hemorrhage, mass, midline shift, or extra-axial fluid collection is identified. The ventricles and sulci are within normal limits for age. Hypodensities in the cerebral white matter bilaterally are nonspecific but compatible with mild chronic small vessel ischemic disease. Vascular: Calcified atherosclerosis at the skull base. No hyperdense vessel. Skull: No fracture or suspicious osseous lesion. Sinuses/Orbits: Visualized paranasal sinuses and mastoid air cells are clear. Bilateral cataract extraction is noted. Other: None. ASPECTS [facility] Stroke Program Early CT Score) - Ganglionic level infarction (caudate, lentiform nuclei, internal capsule, insula, M1-M3 cortex): 7 - Supraganglionic infarction (M4-M6 cortex): 3 Total score (0-10 with 10 being normal): 10 IMPRESSION: 1. No evidence of acute intracranial abnormality. 2. ASPECTS is 10. 3. Mild chronic small vessel ischemic disease. These results were called by telephone at the time of interpretation on 05/28/2019 at 2:58 pm to Dr. Ezequiel Essex , who verbally acknowledged these results. Electronically Signed   By:  Logan Bores M.D.   On: 05/28/2019 15:04    Assessment;  Patient is 69 year old Caucasian male with 5-year history of cirrhosis secondary to [name] who has remained with well-preserved hepatic function who presents with hepatic encephalopathy.  This is the first time that he has developed encephalopathy.  He had Covid over 2 months ago.  It is unclear if deterioration of hepatic function was triggered by this condition.  He does not have any other precipitating events such as GI bleed infection or dehydration.  He is also not taking any medications which might trigger hepatic encephalopathy. He appears to have improved.  Serum ammonia is coming down.  His bilirubin is  higher than it has been before.  I doubt that he has developed portal vein thrombosis or venoocclusive disease.  He will be screened for it.  Clinically he does not appear to have ascites but ultrasound should help confirm it.  If he does have significant ascites he will need diagnostic paracentesis. His meld score is 15.5. Patient was observed while he was drinking water and he did cough a few times.  Therefore will need bedside evaluation by speech pathologist.  Thrombocytopenia secondary to chronic liver disease/splenomegaly. Mild anemia due to chronic disease.  Recommendations;  Speech therapy evaluation at bedside to assess oropharyngeal swallowing function. Begin full liquids.  Change lactulose to oral route. Xifaxan 550 mg by mouth twice daily. Discontinue trazodone which is as needed medication. Abdominal ultrasound with Doppler study as well.    LOS: 1 day   Aamori Mcmasters  05/29/2019, 8:45 AM

## 2019-05-29 NOTE — Progress Notes (Signed)
CPAP had been placed on by RN. RT filled water chamber with sterile water. Patient resting comfortably at this time.

## 2019-05-29 NOTE — Progress Notes (Signed)
CODE STROKE CT TIMES 1427 Call time 1427 beeper time 1433 exam started 1433 exam finished 0484 images sent to [facility] Exam completed in [place] radiology called

## 2019-05-30 ENCOUNTER — Inpatient Hospital Stay (HOSPITAL_COMMUNITY): Payer: Medicare Other

## 2019-05-30 DIAGNOSIS — I739 Peripheral vascular disease, unspecified: Secondary | ICD-10-CM

## 2019-05-30 DIAGNOSIS — K7460 Unspecified cirrhosis of liver: Secondary | ICD-10-CM

## 2019-05-30 DIAGNOSIS — G4733 Obstructive sleep apnea (adult) (pediatric): Secondary | ICD-10-CM

## 2019-05-30 DIAGNOSIS — G9340 Encephalopathy, unspecified: Secondary | ICD-10-CM

## 2019-05-30 LAB — COMPREHENSIVE METABOLIC PANEL
ALT: 30 U/L (ref 0–44)
AST: 50 U/L — ABNORMAL HIGH (ref 15–41)
Albumin: 2.9 g/dL — ABNORMAL LOW (ref 3.5–5.0)
Alkaline Phosphatase: 78 U/L (ref 38–126)
Anion gap: 4 — ABNORMAL LOW (ref 5–15)
BUN: 17 mg/dL (ref 8–23)
CO2: 21 mmol/L — ABNORMAL LOW (ref 22–32)
Calcium: 8 mg/dL — ABNORMAL LOW (ref 8.9–10.3)
Chloride: 113 mmol/L — ABNORMAL HIGH (ref 98–111)
Creatinine, Ser: 0.93 mg/dL (ref 0.61–1.24)
GFR calc Af Amer: >60 mL/min (ref >60)
GFR calc non Af Amer: >60 mL/min (ref >60)
Glucose, Bld: 81 mg/dL (ref 70–99)
Potassium: 3.7 mmol/L (ref 3.5–5.1)
Sodium: 138 mmol/L (ref 135–145)
Total Bilirubin: 4 mg/dL — ABNORMAL HIGH (ref 0.3–1.2)
Total Protein: 5.5 g/dL — ABNORMAL LOW (ref 6.5–8.1)

## 2019-05-30 LAB — AMMONIA
Ammonia: 114 umol/L — ABNORMAL HIGH (ref 9–35)
Ammonia: 94 umol/L — ABNORMAL HIGH (ref 9–35)

## 2019-05-30 MED ORDER — LACTULOSE 10 GM/15ML PO SOLN
60.0000 g | Freq: Once | ORAL | Status: AC
  Administered 2019-05-30: 60 g via ORAL
  Filled 2019-05-30: qty 90

## 2019-05-30 MED ORDER — POTASSIUM CHLORIDE CRYS ER 20 MEQ PO TBCR
40.0000 meq | EXTENDED_RELEASE_TABLET | ORAL | Status: AC
  Administered 2019-05-30 (×2): 40 meq via ORAL
  Filled 2019-05-30 (×2): qty 2

## 2019-05-30 NOTE — Progress Notes (Signed)
PROGRESS NOTE    Cody Austin  YBW:[id number] DOB: Aug 09, 1950 DOA: 05/28/2019 PCP: [name], MD    Brief Narrative:  69 year old male with a history of Cody Austin cirrhosis, admitted to the hospital with altered mental status.  He was found to have hepatic encephalopathy admitted to the hospital for further management with lactulose and Xifaxan.  Gastroenterology following.   Assessment & Plan:   Active Problems:   Hepatic cirrhosis (HCC)   Acute encephalopathy   Peripheral arterial disease (HCC)   OSA (obstructive sleep apnea)   HLD (hyperlipidemia)   1)New Onset Hepatic encephalopathy---  This is patient's first episode of encephalopathy.  Patient presented with altered mental status.  Ammonia on admission 275.  Started on lactulose as well as Xifaxan.  Ammonia is now less than 100 -Mental status improving  -Continue lactulose  2)Nash cirrhosis--- now with cholestasis,  GI following.  Ultrasound noted, will need CT abdomen with contrast to rule out possible portal vein thrombosis.   3)Obstructive sleep apnea.  Continue on CPAP  4)Thrombocytopenia.  Secondary to liver disease.  No signs of bleeding at this time.  5)Hypokalemia/hypomagnesemia--- in the setting of lactulose use, patient denies EtOH use--- replace and recheck   DVT prophylaxis: heparin Code Status: full code Family Communication: discussed with wife at the bedside  Disposition Plan: discharge home when mental status improves further and if no evidence of portal vein thrombosis  Consultants:   Gastroenterology  Procedures:     Antimicrobials:       Subjective: Less confused, having BMs with lactulose -Oral intake is fair  Objective: Vitals:   05/30/19 1730 05/30/19 1800 05/30/19 1830 05/30/19 1854  BP:    132/86  Pulse:  71 (!) 104   Resp: 18 17 16    Temp:      TempSrc:      SpO2:  100% (!) 70%   Weight:      Height:        Intake/Output Summary (Last 24 hours) at 05/30/2019 2013 Last  data filed at 05/30/2019 1300 Gross per 24 hour  Intake 0 ml  Output 700 ml  Net -700 ml   Filed Weights   05/28/19 1439 05/28/19 2230 05/30/19 0600  Weight: 113.4 kg 102.2 kg 103.7 kg    Examination:  General exam: Appears calm and comfortable  Respiratory system: Clear to auscultation. Respiratory effort normal. Cardiovascular system: S1 & S2 heard, RRR. No JVD, No murmurs,  Gastrointestinal system: Abdomen is nondistended, soft and nontender.  Normal bowel sounds heard. Central nervous system: Alert and oriented.  Denies weakness, no focal neurological deficits. +asterixis Psychiatry: pleasant, cooperative with exam, speech is appopriate  Data Reviewed: I have personally reviewed following labs and imaging studies  CBC: Recent Labs  Lab 05/28/19 1438 05/28/19 1549 05/29/19 0424  WBC 2.8*  --  5.1  NEUTROABS 1.6*  --   --   HGB 12.2* 12.2* 12.6*  HCT 36.0* 36.0* 37.2*  MCV 100.6*  --  100.8*  PLT 68*  --  75*   Basic Metabolic Panel: Recent Labs  Lab 05/28/19 1438 05/28/19 1549 05/29/19 0424 05/30/19 0750  NA 139 142 141 138  K 3.8 3.8 3.4* 3.7  CL 113* 109 115* 113*  CO2 21*  --  19* 21*  GLUCOSE 106* 98 92 81  BUN 16 15 16 17   CREATININE 0.93 0.90 0.81 0.93  CALCIUM 8.1*  --  8.3* 8.0*  MG  --   --  1.6*  --  GFR: Estimated Creatinine Clearance: 87.2 mL/min (by C-G formula based on SCr of 0.93 mg/dL). Liver Function Tests: Recent Labs  Lab 05/28/19 1438 05/29/19 0424 05/30/19 0750  AST 48* 51* 50*  ALT 28 30 30   ALKPHOS 98 86 78  BILITOT 2.3* 3.4* 4.0*  PROT 5.5* 5.8* 5.5*  ALBUMIN 2.9* 3.0* 2.9*   No results for input(s): LIPASE, AMYLASE in the last 168 hours. Recent Labs  Lab 05/28/19 2140 05/29/19 0424 05/29/19 1703 05/30/19 0430 05/30/19 1704  AMMONIA 122* 91* 87* 114* 94*   Coagulation Profile: Recent Labs  Lab 05/28/19 1438  INR 1.7*   Cardiac Enzymes: No results for input(s): CKTOTAL, CKMB, CKMBINDEX, TROPONINI in the  last 168 hours. BNP (last 3 results) No results for input(s): PROBNP in the last 8760 hours. HbA1C: No results for input(s): HGBA1C in the last 72 hours. CBG: Recent Labs  Lab 05/28/19 1510  GLUCAP 98   Lipid Profile: No results for input(s): CHOL, HDL, LDLCALC, TRIG, CHOLHDL, LDLDIRECT in the last 72 hours. Thyroid Function Tests: No results for input(s): TSH, T4TOTAL, FREET4, T3FREE, THYROIDAB in the last 72 hours. Anemia Panel: No results for input(s): VITAMINB12, FOLATE, FERRITIN, TIBC, IRON, RETICCTPCT in the last 72 hours. Sepsis Labs: No results for input(s): PROCALCITON, LATICACIDVEN in the last 168 hours.  Recent Results (from the past 240 hour(s))  Respiratory Panel by RT PCR (Flu A&B, Covid) - Nasopharyngeal Swab     Status: None   Collection Time: 05/28/19  2:51 PM   Specimen: Nasopharyngeal Swab  Result Value Ref Range Status   SARS Coronavirus 2 by RT PCR NEGATIVE NEGATIVE Final    Comment: (NOTE) SARS-CoV-2 target nucleic acids are NOT DETECTED. The SARS-CoV-2 RNA is generally detectable in upper respiratoy specimens during the acute phase of infection. The lowest concentration of SARS-CoV-2 viral copies this assay can detect is 131 copies/mL. A negative result does not preclude SARS-Cov-2 infection and should not be used as the sole basis for treatment or other patient management decisions. A negative result may occur with  improper specimen collection/handling, submission of specimen other than nasopharyngeal swab, presence of viral mutation(s) within the areas targeted by this assay, and inadequate number of viral copies (<131 copies/mL). A negative result must be combined with clinical observations, patient history, and epidemiological information. The expected result is Negative. Fact Sheet for Patients:  PinkCheek.be Fact Sheet for Healthcare Providers:  GravelBags.it This test is not yet ap  proved or cleared by the [place] FDA and  has been authorized for detection and/or diagnosis of SARS-CoV-2 by FDA under an Emergency Use Authorization (EUA). This EUA will remain  in effect (meaning this test can be used) for the duration of the COVID-19 declaration under Section 564(b)(1) of the Act, 21 U.S.C. section 360bbb-3(b)(1), unless the authorization is terminated or revoked sooner.    Influenza A by PCR NEGATIVE NEGATIVE Final   Influenza B by PCR NEGATIVE NEGATIVE Final    Comment: (NOTE) The Xpert Xpress SARS-CoV-2/FLU/RSV assay is intended as an aid in  the diagnosis of influenza from Nasopharyngeal swab specimens and  should not be used as a sole basis for treatment. Nasal washings and  aspirates are unacceptable for Xpert Xpress SARS-CoV-2/FLU/RSV  testing. Fact Sheet for Patients: PinkCheek.be Fact Sheet for Healthcare Providers: GravelBags.it This test is not yet approved or cleared by the [place] FDA and  has been authorized for detection and/or diagnosis of SARS-CoV-2 by  FDA under an Emergency Use Authorization (EUA). This  EUA will remain  in effect (meaning this test can be used) for the duration of the  Covid-19 declaration under Section 564(b)(1) of the Act, 21  U.S.C. section 360bbb-3(b)(1), unless the authorization is  terminated or revoked. Performed at [facility], [address] [place], [place] [address]   MRSA PCR Screening     Status: None   Collection Time: 05/28/19 10:10 PM   Specimen: Nasal Mucosa; Nasopharyngeal  Result Value Ref Range Status   MRSA by PCR NEGATIVE NEGATIVE Final    Comment:        The GeneXpert MRSA Assay (FDA approved for NASAL specimens only), is one component of a comprehensive MRSA colonization surveillance program. It is not intended to diagnose MRSA infection nor to guide or monitor treatment for MRSA infections. Performed at [facility],  [address] [place], [place] [address]        Radiology Studies: US Abdomen Complete  Result Date: 05/30/2019 CLINICAL DATA:  Cirrhosis. EXAM: ABDOMEN ULTRASOUND COMPLETE COMPARISON:  Ultrasound 06/29/2018 FINDINGS: Gallbladder: Limited evaluation but evidence for echogenic stones. Common bile duct: Diameter: Not visualized. Liver: Liver is heterogeneous with a nodular contour. Findings are compatible with cirrhosis. Limited evaluation for a lesion due to body habitus. No significant flow in the main portal vein. IVC: No abnormality visualized. Pancreas: Limited evaluation. Spleen: Spleen is enlarged and evidence for varices around the spleen. Spleen measures 11.7 x 12.4 x 12.1 cm, calculated volume is 924 mL. Right Kidney: Length: 13.7 cm. Again noted is a central cyst in the right kidney that measures up to 2.2 cm. Negative for right hydronephrosis. Renal echogenicity is within normal limits. Limited evaluation for a renal lesion due to body habitus. Left Kidney: Length: 10.9 cm. Echogenicity within normal limits. No hydronephrosis. Limited evaluation. Abdominal aorta: No aneurysm visualized. Other findings: None. IMPRESSION: 1. Cholelithiasis.  Limited evaluation of the gallbladder. 2. Cirrhosis. Limited evaluation of the liver due to body habitus. Concern for portal vein occlusion and please refer to the liver duplex examination from the same day. 3. Splenomegaly with varices around the spleen. Findings are compatible with portal hypertension. 4. Limited evaluation of both kidneys.  Negative for hydronephrosis. Electronically Signed   By: Markus Daft M.D.   On: 05/30/2019 17:56   US LIVER DOPPLER  Result Date: 05/30/2019 CLINICAL DATA:  69 year old with cirrhosis. EXAM: DUPLEX ULTRASOUND OF LIVER TECHNIQUE: Color and duplex Doppler ultrasound was performed to evaluate the hepatic in-flow and out-flow vessels. COMPARISON:  Ultrasound 05/30/2019.  MRI 04/26/2017 FINDINGS: Liver: Liver is poorly visualized  on this examination due to body habitus. Liver has a nodular contour and compatible with cirrhosis. Main Portal Vein size: 0.8 cm Portal Vein Velocities Main Prox: None Main Mid: None Main Dist: None Right: Limited evaluation Left: Limited evaluation Hepatic Vein Velocities Right:  21 cm/sec Middle:  47 cm/sec Left:  21 cm/sec IVC: Present and patent with normal respiratory phasicity. Hepatic Artery Velocity:  80 cm/sec Splenic Vein Velocity:  35 cm/sec Spleen: Refer to abdominal ultrasound from same day. Portal Vein Occlusion/Thrombus: Yes Splenic Vein Occlusion/Thrombus: No Ascites: None Varices: Not clear No definite flow in the main portal vein. Question a small amount of flow in the intrahepatic portal venous system. Hepatic veins appear to be patent with hepatofugal flow. Evidence for echogenic gallstones. IMPRESSION: 1. No definite blood flow in the main portal vein. Findings are concerning for portal vein occlusion. Study has technical limitations due to body habitus. Portal vein occlusion could be confirmed with  a post contrast CT or MRI. 2. Nodular contour of the liver is suggestive for cirrhosis. 3. Cholelithiasis. Electronically Signed   By: Markus Daft M.D.   On: 05/30/2019 17:47   Scheduled Meds: . acidophilus  1 capsule Oral Daily  . Chlorhexidine Gluconate Cloth  6 each Topical Daily  . heparin  5,000 Units Subcutaneous Q8H  . lactulose  20 g Oral TID  . mouth rinse  15 mL Mouth Rinse BID  . rifaximin  550 mg Oral BID   Continuous Infusions:   LOS: 2 days     Roxan Hockey, MD [facility]   If 7PM-7AM, please contact night-coverage [email or web address]  05/30/2019, 8:13 PM

## 2019-05-30 NOTE — Progress Notes (Signed)
Subjective:  Patient has no complaints.  He states he has good appetite.  He has had multiple bowel movements.  No melena or rectal bleeding reported.  He denies abdominal pain shortness of breath or chest pain.  Objective: Blood pressure (!) 129/58, pulse (!) 59, temperature 98.7 F (37.1 C), temperature source Oral, resp. rate 18, height 5' 7"  (1.702 m), weight 103.7 kg, SpO2 100 %. Patient is alert and in no acute distress. He does not have asterixis. Abdomen is protuberant but soft and nontender with organomegaly or masses No LE edema noted.  He has mild edema to left hand.   Labs/studies Results:  CBC Latest Ref Rng & Units 05/29/2019 05/28/2019 05/28/2019  WBC 4.0 - 10.5 K/uL 5.1 - 2.8(L)  Hemoglobin 13.0 - 17.0 g/dL 12.6(L) 12.2(L) 12.2(L)  Hematocrit 39.0 - 52.0 % 37.2(L) 36.0(L) 36.0(L)  Platelets 150 - 400 K/uL 75(L) - 68(L)    CMP Latest Ref Rng & Units 05/30/2019 05/29/2019 05/28/2019  Glucose 70 - 99 mg/dL 81 92 98  BUN 8 - 23 mg/dL 17 16 15   Creatinine 0.61 - 1.24 mg/dL 0.93 0.81 0.90  Sodium 135 - 145 mmol/L 138 141 142  Potassium 3.5 - 5.1 mmol/L 3.7 3.4(L) 3.8  Chloride 98 - 111 mmol/L 113(H) 115(H) 109  CO2 22 - 32 mmol/L 21(L) 19(L) -  Calcium 8.9 - 10.3 mg/dL 8.0(L) 8.3(L) -  Total Protein 6.5 - 8.1 g/dL 5.5(L) 5.8(L) -  Total Bilirubin 0.3 - 1.2 mg/dL 4.0(H) 3.4(H) -  Alkaline Phos 38 - 126 U/L 78 86 -  AST 15 - 41 U/L 50(H) 51(H) -  ALT 0 - 44 U/L 30 30 -    Hepatic Function Latest Ref Rng & Units 05/30/2019 05/29/2019 05/28/2019  Total Protein 6.5 - 8.1 g/dL 5.5(L) 5.8(L) 5.5(L)  Albumin 3.5 - 5.0 g/dL 2.9(L) 3.0(L) 2.9(L)  AST 15 - 41 U/L 50(H) 51(H) 48(H)  ALT 0 - 44 U/L 30 30 28   Alk Phosphatase 38 - 126 U/L 78 86 98  Total Bilirubin 0.3 - 1.2 mg/dL 4.0(H) 3.4(H) 2.3(H)  Bilirubin, Direct 0.00 - 0.40 mg/dL - - -    Serum ammonia 114 this morning and 94 this evening.  aFP is pending.  Abdominal ultrasound reveals cholelithiasis.  He is known to have  gallstones for few years.  Heterogeneous liver with nodular contour consistent with cirrhosis.  Splenomegaly and perisplenic varices.  No ascites.  Doppler study reveals no definite blood flow in portal vein.   Assessment:  #1.  New onset of hepatic encephalopathy in a patient with history of cirrhosis.  He is responding to lactulose and Xifaxan.  Serum ammonia has come down but not normal yet.  #2.  Cirrhosis.  Etiology felt to be NASH.  He was diagnosed with cirrhosis when he was seen by Dr. Tressie Stalker in 2016.  Work-up included negative studies for hepatitis B and C.  He had mildly elevated serum ferritin felt to be due to chronic liver disease.  Iron saturation was not consistent with hemochromatosis.  Biochemical markers for autoimmune hepatitis were negative.  His condition has remained compensated and until now. He remains with elevated serum bilirubin. Portal vein not well seen on Doppler study.  Therefore we will proceed with CT with IV contrast. Thrombocytopenia secondary to chronic liver disease and splenomegaly Mild anemia due to chronic disease.  #3.  Asymptomatic cholelithiasis.  #4.  Obesity.  When I saw him back in 2016 he weighed 225 pounds.  Unfortunately he  has not lost any weight.  Lately he has been very sedentary according to his family members.   Recommendations  A.m. labs to include INR, serum ammonia and LFTs. Abdominal CT with IV contrast in a.m. to rule out portal venous thrombosis. Will arrange for esophagogastroduodenoscopy to screen for esophageal varices on an outpatient basis. Arrange for consultation at [facility] for transplant evaluation.  These note patient's condition discussed with his daughter [name] and his son Catalina Antigua who is a Industrial/product designer based in [place].

## 2019-05-31 ENCOUNTER — Inpatient Hospital Stay (HOSPITAL_COMMUNITY): Payer: Medicare Other

## 2019-05-31 LAB — HEPATIC FUNCTION PANEL
ALT: 31 U/L (ref 0–44)
AST: 55 U/L — ABNORMAL HIGH (ref 15–41)
Albumin: 3 g/dL — ABNORMAL LOW (ref 3.5–5.0)
Alkaline Phosphatase: 80 U/L (ref 38–126)
Bilirubin, Direct: 1 mg/dL — ABNORMAL HIGH (ref 0.0–0.2)
Indirect Bilirubin: 2.8 mg/dL — ABNORMAL HIGH (ref 0.3–0.9)
Total Bilirubin: 3.8 mg/dL — ABNORMAL HIGH (ref 0.3–1.2)
Total Protein: 5.6 g/dL — ABNORMAL LOW (ref 6.5–8.1)

## 2019-05-31 LAB — PROTIME-INR
INR: 1.5 — ABNORMAL HIGH (ref 0.8–1.2)
Prothrombin Time: 18.1 seconds — ABNORMAL HIGH (ref 11.4–15.2)

## 2019-05-31 LAB — AMMONIA: Ammonia: 66 umol/L — ABNORMAL HIGH (ref 9–35)

## 2019-05-31 MED ORDER — IOHEXOL 300 MG/ML  SOLN
100.0000 mL | Freq: Once | INTRAMUSCULAR | Status: AC | PRN
  Administered 2019-05-31: 100 mL via INTRAVENOUS

## 2019-05-31 MED ORDER — IOHEXOL 350 MG/ML SOLN: 100.0000 mL | Freq: Once | INTRAVENOUS | Status: DC | PRN

## 2019-05-31 NOTE — Progress Notes (Addendum)
  Subjective:  He has no complaints.  He has good appetite.  He denies abdominal pain.  He says he is having multiple loose stools.  He has no difficulty ambulating on the floor.  Objective: Blood pressure (!) 147/70, pulse 84, temperature 98.1 F (36.7 C), temperature source Oral, resp. rate 20, height 5' 7"  (1.702 m), weight 103.7 kg, SpO2 99 %. Patient is alert and in no acute distress. He does not have asterixis. Abdomen is full but soft and nontender. No LE edema noted.  Labs/studies Results:   CBC Latest Ref Rng & Units 05/29/2019 05/28/2019 05/28/2019  WBC 4.0 - 10.5 K/uL 5.1 - 2.8(L)  Hemoglobin 13.0 - 17.0 g/dL 12.6(L) 12.2(L) 12.2(L)  Hematocrit 39.0 - 52.0 % 37.2(L) 36.0(L) 36.0(L)  Platelets 150 - 400 K/uL 75(L) - 68(L)    CMP Latest Ref Rng & Units 05/31/2019 05/30/2019 05/29/2019  Glucose 70 - 99 mg/dL - 81 92  BUN 8 - 23 mg/dL - 17 16  Creatinine 0.61 - 1.24 mg/dL - 0.93 0.81  Sodium 135 - 145 mmol/L - 138 141  Potassium 3.5 - 5.1 mmol/L - 3.7 3.4(L)  Chloride 98 - 111 mmol/L - 113(H) 115(H)  CO2 22 - 32 mmol/L - 21(L) 19(L)  Calcium 8.9 - 10.3 mg/dL - 8.0(L) 8.3(L)  Total Protein 6.5 - 8.1 g/dL 5.6(L) 5.5(L) 5.8(L)  Total Bilirubin 0.3 - 1.2 mg/dL 3.8(H) 4.0(H) 3.4(H)  Alkaline Phos 38 - 126 U/L 80 78 86  AST 15 - 41 U/L 55(H) 50(H) 51(H)  ALT 0 - 44 U/L 31 30 30     Hepatic Function Latest Ref Rng & Units 05/31/2019 05/30/2019 05/29/2019  Total Protein 6.5 - 8.1 g/dL 5.6(L) 5.5(L) 5.8(L)  Albumin 3.5 - 5.0 g/dL 3.0(L) 2.9(L) 3.0(L)  AST 15 - 41 U/L 55(H) 50(H) 51(H)  ALT 0 - 44 U/L 31 30 30   Alk Phosphatase 38 - 126 U/L 80 78 86  Total Bilirubin 0.3 - 1.2 mg/dL 3.8(H) 4.0(H) 3.4(H)  Bilirubin, Direct 0.0 - 0.2 mg/dL 1.0(H) - -    INR 1.5.  It was 1.7 on admission.  Serum ammonia is down to 66.  Abdominal CT with IV contrast revealed small portal vein but no evidence of portal vein thrombus.   Assessment:  #1.  New onset of hepatic encephalopathy.  No obvious  precipitating event.  Serum ammonia has decreased significantly since admission.  He does not have asterixis anymore.  His gait is normal.  #2.  Cirrhosis secondary to NASH.  His meld score based on blood work from this morning is 17.4.  He remains with mild cholestasis.  CT does not show evidence of portal vein thrombus but his portal vein is small.  Since he has developed evidence of hepatic decompensation he will be referred to [facility] for [facility] evaluation. Patient's condition discussed with his daughter [name] and son Catalina Antigua over the phone.  #3.  Asymptomatic cholelithiasis.  #4.  Obesity.  Dietary consultation appreciated.  Patient seem motivated to increase physical activity and try to lose some weight.  Recommendations  Continue lactulose and Xifaxan at current dose. CBC and LFTs tomorrow morning.

## 2019-05-31 NOTE — Progress Notes (Signed)
Nutrition Education Note  RD consulted for nutrition education regarding a Low sodium/ CHO modified diet. Patient wife is also at bedside during education.  RD provided "Low Sodium Nutrition Therapy" handout from the Academy of [employer]. Emphasized the importance of consistent lean protein intake throughout the day. Identified strategies for eliminating excess, caloric intake.  Reviewed patient's dietary recall. He frequently eats snack foods, chips, cheese. Patient regularly skips breakfast and his first meal is usually around lunch. Strongly encouraged him to adapt his eating pattern to include 4-6 smaller balanced meals daily.  Provided examples on ways to decrease sodium and fat intake in diet. Discouraged intake of processed foods and use of salt shaker. Addressed challenges of eating out and offered healthier options.   Encouraged fresh fruits and vegetables as well as whole grain sources of carbohydrates to maximize fiber intake. Talked at length about portion control and appropriate serving sizes.  Body mass index is 35.81 kg/m. Pt meets criteria for obese based on current BMI.  Current diet order is Heart Healthy/CHO modified, patient is consuming approximately >75% of meals at this time.   Labs and medications reviewed. No further nutrition interventions warranted at this time. RD contact information provided. If additional nutrition issues arise, please re-consult RD.   Jeani Hawking RD Clinical Nutrition

## 2019-05-31 NOTE — Progress Notes (Signed)
PROGRESS NOTE    Cody Austin  PPI:[id number] DOB: January 27, 1951 DOA: 05/28/2019 PCP: [name], MD    Brief Narrative:  69 year old male with a history of [name] cirrhosis, admitted to the hospital with altered mental status.  He was found to have hepatic encephalopathy admitted to the hospital for further management with lactulose and Xifaxan.  Gastroenterology following.   Assessment & Plan:   Active Problems:   Hepatic cirrhosis (HCC)   Acute encephalopathy   Peripheral arterial disease (HCC)   OSA (obstructive sleep apnea)   HLD (hyperlipidemia)   1)New Onset Hepatic encephalopathy---  This is patient's first episode of encephalopathy.  Patient presented with altered mental status.  Ammonia on admission 275.  Started on lactulose as well as Xifaxan.  Ammonia is now down below 70 -Continues to have BM with lactulose, mental status continues to improve -as per gastroenterologist Dr. Laural Golden hold discharge until 06/01/2019--continue lactulose to allow for further improvement in metastatic   2)Nash cirrhosis--- now with cholestasis in the setting of asymptomatic cholelithiasis  -GI following.  Ultrasound noted,  CT abdomen with contrast without definite evidence of portal vein thrombosis, however portal vein is very small -INR is 1.5 -MELD score is 17.4--Dr. Laural Golden plans to refer to [facility] for liver transplant evaluation.   3)Obstructive sleep apnea.  Continue on CPAP  4)Thrombocytopenia.  Secondary to liver disease.  No signs of bleeding at this time.  5)Hypokalemia/hypomagnesemia--- in the setting of lactulose use, patient denies EtOH use--- replace and recheck  6) obesity--- contributing to NASH cirrhosis, dietary consult appreciated   DVT prophylaxis: heparin Code Status: full code Family Communication: discussed with wife at the bedside  Disposition Plan: discharge home when mental status improves further -as per gastroenterologist Dr. Laural Golden hold discharge  until 06/01/2019 to allow for further improvement in mental status--continue lactulose  Consultants:   Gastroenterology  Procedures:     Antimicrobials:       Subjective: -Confusion continues to improve -Some fatigue persist -Wife at bedside questions answered -Having BMs with lactulose  Objective: Vitals:   05/30/19 2240 05/31/19 0515 05/31/19 1141 05/31/19 1358  BP:  (!) 114/54  (!) 147/70  Pulse: 76 (!) 59  84  Resp: 18 18  20   Temp:  97.6 F (36.4 C)  98.1 F (36.7 C)  TempSrc:    Oral  SpO2: 100% 99% 99% 99%  Weight:      Height:        Intake/Output Summary (Last 24 hours) at 05/31/2019 1925 Last data filed at 05/31/2019 1700 Gross per 24 hour  Intake 720 ml  Output --  Net 720 ml   Filed Weights   05/28/19 1439 05/28/19 2230 05/30/19 0600  Weight: 113.4 kg 102.2 kg 103.7 kg    Examination:  General exam: Appears calm and comfortable  Respiratory system: Clear to auscultation. Respiratory effort normal. Cardiovascular system: S1 & S2 heard, RRR. No JVD, No murmurs,  Gastrointestinal system: Abdomen is nondistended, soft and nontender.  Normal bowel sounds heard.  Increased truncal adiposity Central nervous system: Alert and oriented.  Denies weakness, no focal neurological deficits. +asterixis Psychiatry: pleasant, cooperative with exam, speech is appopriate  Data Reviewed: I have personally reviewed following labs and imaging studies  CBC: Recent Labs  Lab 05/28/19 1438 05/28/19 1549 05/29/19 0424  WBC 2.8*  --  5.1  NEUTROABS 1.6*  --   --   HGB 12.2* 12.2* 12.6*  HCT 36.0* 36.0* 37.2*  MCV 100.6*  --  100.8*  PLT 68*  --  75*   Basic Metabolic Panel: Recent Labs  Lab 05/28/19 1438 05/28/19 1549 05/29/19 0424 05/30/19 0750  NA 139 142 141 138  K 3.8 3.8 3.4* 3.7  CL 113* 109 115* 113*  CO2 21*  --  19* 21*  GLUCOSE 106* 98 92 81  BUN 16 15 16 17   CREATININE 0.93 0.90 0.81 0.93  CALCIUM 8.1*  --  8.3* 8.0*  MG  --   --  1.6*   --    GFR: Estimated Creatinine Clearance: 87.2 mL/min (by C-G formula based on SCr of 0.93 mg/dL). Liver Function Tests: Recent Labs  Lab 05/28/19 1438 05/29/19 0424 05/30/19 0750 05/31/19 0930  AST 48* 51* 50* 55*  ALT 28 30 30 31   ALKPHOS 98 86 78 80  BILITOT 2.3* 3.4* 4.0* 3.8*  PROT 5.5* 5.8* 5.5* 5.6*  ALBUMIN 2.9* 3.0* 2.9* 3.0*   No results for input(s): LIPASE, AMYLASE in the last 168 hours. Recent Labs  Lab 05/29/19 0424 05/29/19 1703 05/30/19 0430 05/30/19 1704 05/31/19 0511  AMMONIA 91* 87* 114* 94* 66*   Coagulation Profile: Recent Labs  Lab 05/28/19 1438 05/31/19 0930  INR 1.7* 1.5*   Cardiac Enzymes: No results for input(s): CKTOTAL, CKMB, CKMBINDEX, TROPONINI in the last 168 hours. BNP (last 3 results) No results for input(s): PROBNP in the last 8760 hours. HbA1C: No results for input(s): HGBA1C in the last 72 hours. CBG: Recent Labs  Lab 05/28/19 1510  GLUCAP 98   Lipid Profile: No results for input(s): CHOL, HDL, LDLCALC, TRIG, CHOLHDL, LDLDIRECT in the last 72 hours. Thyroid Function Tests: No results for input(s): TSH, T4TOTAL, FREET4, T3FREE, THYROIDAB in the last 72 hours. Anemia Panel: No results for input(s): VITAMINB12, FOLATE, FERRITIN, TIBC, IRON, RETICCTPCT in the last 72 hours. Sepsis Labs: No results for input(s): PROCALCITON, LATICACIDVEN in the last 168 hours.  Recent Results (from the past 240 hour(s))  Respiratory Panel by RT PCR (Flu A&B, Covid) - Nasopharyngeal Swab     Status: None   Collection Time: 05/28/19  2:51 PM   Specimen: Nasopharyngeal Swab  Result Value Ref Range Status   SARS Coronavirus 2 by RT PCR NEGATIVE NEGATIVE Final    Comment: (NOTE) SARS-CoV-2 target nucleic acids are NOT DETECTED. The SARS-CoV-2 RNA is generally detectable in upper respiratoy specimens during the acute phase of infection. The lowest concentration of SARS-CoV-2 viral copies this assay can detect is 131 copies/mL. A negative  result does not preclude SARS-Cov-2 infection and should not be used as the sole basis for treatment or other patient management decisions. A negative result may occur with  improper specimen collection/handling, submission of specimen other than nasopharyngeal swab, presence of viral mutation(s) within the areas targeted by this assay, and inadequate number of viral copies (<131 copies/mL). A negative result must be combined with clinical observations, patient history, and epidemiological information. The expected result is Negative. Fact Sheet for Patients:  PinkCheek.be Fact Sheet for Healthcare Providers:  GravelBags.it This test is not yet ap proved or cleared by the [place] FDA and  has been authorized for detection and/or diagnosis of SARS-CoV-2 by FDA under an Emergency Use Authorization (EUA). This EUA will remain  in effect (meaning this test can be used) for the duration of the COVID-19 declaration under Section 564(b)(1) of the Act, 21 U.S.C. section 360bbb-3(b)(1), unless the authorization is terminated or revoked sooner.    Influenza A by PCR NEGATIVE NEGATIVE Final  Influenza B by PCR NEGATIVE NEGATIVE Final    Comment: (NOTE) The Xpert Xpress SARS-CoV-2/FLU/RSV assay is intended as an aid in  the diagnosis of influenza from Nasopharyngeal swab specimens and  should not be used as a sole basis for treatment. Nasal washings and  aspirates are unacceptable for Xpert Xpress SARS-CoV-2/FLU/RSV  testing. Fact Sheet for Patients: PinkCheek.be Fact Sheet for Healthcare Providers: GravelBags.it This test is not yet approved or cleared by the [place] FDA and  has been authorized for detection and/or diagnosis of SARS-CoV-2 by  FDA under an Emergency Use Authorization (EUA). This EUA will remain  in effect (meaning this test can be used) for the  duration of the  Covid-19 declaration under Section 564(b)(1) of the Act, 21  U.S.C. section 360bbb-3(b)(1), unless the authorization is  terminated or revoked. Performed at [facility], [address] [place], [place] [address]   MRSA PCR Screening     Status: None   Collection Time: 05/28/19 10:10 PM   Specimen: Nasal Mucosa; Nasopharyngeal  Result Value Ref Range Status   MRSA by PCR NEGATIVE NEGATIVE Final    Comment:        The GeneXpert MRSA Assay (FDA approved for NASAL specimens only), is one component of a comprehensive MRSA colonization surveillance program. It is not intended to diagnose MRSA infection nor to guide or monitor treatment for MRSA infections. Performed at [facility], [address] [place], [place] [address]        Radiology Studies: US Abdomen Complete  Result Date: 05/30/2019 CLINICAL DATA:  Cirrhosis. EXAM: ABDOMEN ULTRASOUND COMPLETE COMPARISON:  Ultrasound 06/29/2018 FINDINGS: Gallbladder: Limited evaluation but evidence for echogenic stones. Common bile duct: Diameter: Not visualized. Liver: Liver is heterogeneous with a nodular contour. Findings are compatible with cirrhosis. Limited evaluation for a lesion due to body habitus. No significant flow in the main portal vein. IVC: No abnormality visualized. Pancreas: Limited evaluation. Spleen: Spleen is enlarged and evidence for varices around the spleen. Spleen measures 11.7 x 12.4 x 12.1 cm, calculated volume is 924 mL. Right Kidney: Length: 13.7 cm. Again noted is a central cyst in the right kidney that measures up to 2.2 cm. Negative for right hydronephrosis. Renal echogenicity is within normal limits. Limited evaluation for a renal lesion due to body habitus. Left Kidney: Length: 10.9 cm. Echogenicity within normal limits. No hydronephrosis. Limited evaluation. Abdominal aorta: No aneurysm visualized. Other findings: None. IMPRESSION: 1. Cholelithiasis.  Limited evaluation of the gallbladder. 2.  Cirrhosis. Limited evaluation of the liver due to body habitus. Concern for portal vein occlusion and please refer to the liver duplex examination from the same day. 3. Splenomegaly with varices around the spleen. Findings are compatible with portal hypertension. 4. Limited evaluation of both kidneys.  Negative for hydronephrosis. Electronically Signed   By: Markus Daft M.D.   On: 05/30/2019 17:56   CT ABDOMEN W WO CONTRAST  Result Date: 05/31/2019 CLINICAL DATA:  PORTAL VEIN THROMBOSIS. EXAM: CT ABDOMEN WITHOUT AND WITH CONTRAST TECHNIQUE: Multidetector CT imaging of the abdomen was performed following the standard protocol before and following the bolus administration of intravenous contrast. CONTRAST:  150m OMNIPAQUE IOHEXOL 300 MG/ML  SOLN COMPARISON:  ABDOMINAL ULTRASOUND 05/30/2019 FINDINGS: Lower chest: Unremarkable. Hepatobiliary: Nodular liver contour is compatible with cirrhosis. No evidence for arterial phase hyperenhancement. No enhancing lesion on later phase imaging. 7 mm hypodensity in the caudate lobe adjacent to the IVC is too small to characterize but likely benign. Calcified gallstones measure in the  5-6 mm size range. No intrahepatic or extrahepatic biliary dilation. Pancreas: 12 mm low-density lesion identified in the uncinate process of the pancreas. No dilatation of the main duct. Spleen: No splenomegaly. No focal mass lesion. Adrenals/Urinary Tract: No adrenal nodule or mass. 10 mm exophytic lesion upper pole right kidney is likely a cyst. 2 cm interpolar right renal lesion approaches water density, compatible with cyst. Other similar tiny hypodense lesions are noted in the cortex of both kidneys. Stomach/Bowel: Stomach is unremarkable. No gastric wall thickening. No evidence of outlet obstruction. Duodenum is normally positioned as is the ligament of Treitz. No small bowel or colonic dilatation within the visualized abdomen. Vascular/Lymphatic: Aortic Atherosclerois (ICD10-170.0) Portal  vein in the hepatoduodenal ligament is attenuated but opacifies. Left and right portal venous anatomy opacifies although right portal vein is quite diminutive. There is extensive venous collateralization in the left abdomen with evidence of splenorenal shunt. Paraesophageal varices are evident. Other: No intraperitoneal free fluid. Musculoskeletal: No worrisome lytic or sclerotic osseous abnormality. IMPRESSION: 1. Cirrhotic changes in the liver with evidence of portal venous portal vein is hypertension including paraesophageal varices and splenorenal shunt. 2. Portal vein in the hepatoduodenal ligament is diminutive but does opacify suggesting patency. Opacification of the left and right portal venous anatomy is evident. 3. 12 mm low-density lesion in the uncinate process of the pancreas is stable since abdominal MRI of 03/30/2019. MRI recommendation at that time was for follow-up MRI in 2 years which would indicate repeat MRI in December of 2022. 4. Cholelithiasis. 5. Bilateral renal cysts. 6.  Aortic Atherosclerois (ICD10-170.0) Electronically Signed   By: Misty Stanley M.D.   On: 05/31/2019 12:12   US LIVER DOPPLER  Result Date: 05/30/2019 CLINICAL DATA:  69 year old with cirrhosis. EXAM: DUPLEX ULTRASOUND OF LIVER TECHNIQUE: Color and duplex Doppler ultrasound was performed to evaluate the hepatic in-flow and out-flow vessels. COMPARISON:  Ultrasound 05/30/2019.  MRI 04/26/2017 FINDINGS: Liver: Liver is poorly visualized on this examination due to body habitus. Liver has a nodular contour and compatible with cirrhosis. Main Portal Vein size: 0.8 cm Portal Vein Velocities Main Prox: None Main Mid: None Main Dist: None Right: Limited evaluation Left: Limited evaluation Hepatic Vein Velocities Right:  21 cm/sec Middle:  47 cm/sec Left:  21 cm/sec IVC: Present and patent with normal respiratory phasicity. Hepatic Artery Velocity:  80 cm/sec Splenic Vein Velocity:  35 cm/sec Spleen: Refer to abdominal ultrasound  from same day. Portal Vein Occlusion/Thrombus: Yes Splenic Vein Occlusion/Thrombus: No Ascites: None Varices: Not clear No definite flow in the main portal vein. Question a small amount of flow in the intrahepatic portal venous system. Hepatic veins appear to be patent with hepatofugal flow. Evidence for echogenic gallstones. IMPRESSION: 1. No definite blood flow in the main portal vein. Findings are concerning for portal vein occlusion. Study has technical limitations due to body habitus. Portal vein occlusion could be confirmed with a post contrast CT or MRI. 2. Nodular contour of the liver is suggestive for cirrhosis. 3. Cholelithiasis. Electronically Signed   By: Markus Daft M.D.   On: 05/30/2019 17:47   Scheduled Meds:  acidophilus  1 capsule Oral Daily   Chlorhexidine Gluconate Cloth  6 each Topical Daily   heparin  5,000 Units Subcutaneous Q8H   lactulose  20 g Oral TID   mouth rinse  15 mL Mouth Rinse BID   rifaximin  550 mg Oral BID   Continuous Infusions:   LOS: 3 days    Roxan Hockey, MD [facility]   If 7PM-7AM, please contact night-coverage [email or web address]  05/31/2019, 7:25 PM

## 2019-05-31 NOTE — Evaluation (Signed)
Physical Therapy Evaluation Patient Details Name: Cody Austin MRN: [id number] DOB: 1950/12/05 Today's Date: 05/31/2019   History of Present Illness  Cody Austin is a 69 y.o. male with medical history significant of CKD, Cirrhosis, DM, OSA, pancytopenia.  Pt was last reported nml at 09:00 this am. Shortly after that time. Pt began to shuffle his feet, appear unstable and slur his speech. Pt also came into the living room without pants attempting to grab his foot. EMS was called and pt was in his  Normal state of health at time of arrival. Per pt wife in the weeks leading up to admission he was experiencing a worsening tremor. Unsure of how many BMs he was having. States pt had COVID back in October and had never been fully well since that time. No other complaints including fever, n/v/abd pain, dysuria.    Clinical Impression  Physical therapy evaluation completed, patient is at baseline and no further PT services recommended at this time.Pt able to navigate sloping surfaces without AD, no loss of balance, O2 sat 100% on RA. Spouse present for evaluation and agrees with PT plan. Patient discharged to care of nursing for ambulation daily as tolerated for length of stay.     Follow Up Recommendations No PT follow up    Equipment Recommendations  None recommended by PT    Recommendations for Other Services       Precautions / Restrictions Precautions Precautions: None Restrictions Weight Bearing Restrictions: No      Mobility  Bed Mobility               General bed mobility comments: pt up in bedside chair upon arrival  Transfers Overall transfer level: Modified independent                  Ambulation/Gait Ambulation/Gait assistance: Modified independent (Device/Increase time) Gait Distance (Feet): 150 Feet Assistive device: None Gait Pattern/deviations: WFL(Within Functional Limits) Gait velocity: slightly decreased   General Gait Details: pt able to  navigate incline/decline sloping surfaces without loss of balance or AD, denies fatigue, O2 sat 100% on RA  Stairs            Wheelchair Mobility    Modified Rankin (Stroke Patients Only)       Balance Overall balance assessment: No apparent balance deficits (not formally assessed)                                           Pertinent Vitals/Pain Pain Assessment: No/denies pain    Home Living Family/patient expects to be discharged to:: Private residence Living Arrangements: Spouse/significant other Available Help at Discharge: Family Type of Home: House Home Access: Stairs to enter Entrance Stairs-Rails: None Technical brewer of Steps: 2 Home Layout: One level(2 steps to enter den with bil handrail) Home Equipment: None      Prior Function Level of Independence: Independent         Comments: Pt reports independent with community ambulation without AD, drives independently, independent with ADLs     Hand Dominance        Extremity/Trunk Assessment   Upper Extremity Assessment Upper Extremity Assessment: Overall WFL for tasks assessed    Lower Extremity Assessment Lower Extremity Assessment: Overall WFL for tasks assessed    Cervical / Trunk Assessment Cervical / Trunk Assessment: Normal  Communication   Communication: No difficulties  Cognition Arousal/Alertness:  Awake/alert Behavior During Therapy: WFL for tasks assessed/performed Overall Cognitive Status: Within Functional Limits for tasks assessed                                        General Comments      Exercises     Assessment/Plan    PT Assessment Patent does not need any further PT services  PT Problem List Decreased activity tolerance       PT Treatment Interventions      PT Goals (Current goals can be found in the Care Plan section)  Acute Rehab PT Goals Patient Stated Goal: return home with spouse PT Goal Formulation: With  patient Time For Goal Achievement: 05/31/19 Potential to Achieve Goals: Good    Frequency     Barriers to discharge        Co-evaluation               AM-PAC PT "6 Clicks" Mobility  Outcome Measure Help needed turning from your back to your side while in a flat bed without using bedrails?: None Help needed moving from lying on your back to sitting on the side of a flat bed without using bedrails?: None Help needed moving to and from a bed to a chair (including a wheelchair)?: None Help needed standing up from a chair using your arms (e.g., wheelchair or bedside chair)?: None Help needed to walk in hospital room?: None Help needed climbing 3-5 steps with a railing? : None 6 Click Score: 24    End of Session Equipment Utilized During Treatment: Gait belt Activity Tolerance: Patient tolerated treatment well Patient left: in chair;with call bell/phone within reach;with family/visitor present Nurse Communication: Mobility status PT Visit Diagnosis: Other abnormalities of gait and mobility (R26.89)    Time: 5830-9407 PT Time Calculation (min) (ACUTE ONLY): 22 min   Charges:   PT Evaluation $PT Eval Moderate Complexity: 1 Mod         Tori Michoel Kunin PT, DPT 05/31/19, 12:48 PM [phone]

## 2019-06-01 ENCOUNTER — Encounter (HOSPITAL_COMMUNITY): Payer: Self-pay | Admitting: Family Medicine

## 2019-06-01 DIAGNOSIS — U071 COVID-19: Secondary | ICD-10-CM | POA: Diagnosis present

## 2019-06-01 DIAGNOSIS — K7290 Hepatic failure, unspecified without coma: Secondary | ICD-10-CM | POA: Diagnosis present

## 2019-06-01 DIAGNOSIS — K7460 Unspecified cirrhosis of liver: Secondary | ICD-10-CM | POA: Diagnosis present

## 2019-06-01 DIAGNOSIS — K7682 Hepatic encephalopathy: Secondary | ICD-10-CM | POA: Diagnosis present

## 2019-06-01 HISTORY — DX: Unspecified cirrhosis of liver: K74.60

## 2019-06-01 LAB — COMPREHENSIVE METABOLIC PANEL
ALT: 29 U/L (ref 0–44)
AST: 49 U/L — ABNORMAL HIGH (ref 15–41)
Albumin: 2.9 g/dL — ABNORMAL LOW (ref 3.5–5.0)
Alkaline Phosphatase: 82 U/L (ref 38–126)
Anion gap: 8 (ref 5–15)
BUN: 16 mg/dL (ref 8–23)
CO2: 22 mmol/L (ref 22–32)
Calcium: 8.7 mg/dL — ABNORMAL LOW (ref 8.9–10.3)
Chloride: 106 mmol/L (ref 98–111)
Creatinine, Ser: 0.95 mg/dL (ref 0.61–1.24)
GFR calc Af Amer: >60 mL/min (ref >60)
GFR calc non Af Amer: >60 mL/min (ref >60)
Glucose, Bld: 92 mg/dL (ref 70–99)
Potassium: 3.7 mmol/L (ref 3.5–5.1)
Sodium: 136 mmol/L (ref 135–145)
Total Bilirubin: 2.8 mg/dL — ABNORMAL HIGH (ref 0.3–1.2)
Total Protein: 5.5 g/dL — ABNORMAL LOW (ref 6.5–8.1)

## 2019-06-01 LAB — CBC
HCT: 35.8 % — ABNORMAL LOW (ref 39.0–52.0)
Hemoglobin: 12 g/dL — ABNORMAL LOW (ref 13.0–17.0)
MCH: 34.3 pg — ABNORMAL HIGH (ref 26.0–34.0)
MCHC: 33.5 g/dL (ref 30.0–36.0)
MCV: 102.3 fL — ABNORMAL HIGH (ref 80.0–100.0)
Platelets: 70 10*3/uL — ABNORMAL LOW (ref 150–400)
RBC: 3.5 MIL/uL — ABNORMAL LOW (ref 4.22–5.81)
RDW: 14.7 % (ref 11.5–15.5)
WBC: 3.6 10*3/uL — ABNORMAL LOW (ref 4.0–10.5)
nRBC: 0 % (ref 0.0–0.2)

## 2019-06-01 LAB — AMMONIA: Ammonia: 72 umol/L — ABNORMAL HIGH (ref 9–35)

## 2019-06-01 LAB — MAGNESIUM: Magnesium: 1.7 mg/dL (ref 1.7–2.4)

## 2019-06-01 MED ORDER — RIFAXIMIN 550 MG PO TABS
550.0000 mg | ORAL_TABLET | Freq: Once | ORAL | Status: AC
  Administered 2019-06-01: 550 mg via ORAL
  Filled 2019-06-01: qty 1

## 2019-06-01 MED ORDER — MAGNESIUM SULFATE 2 GM/50ML IV SOLN
2.0000 g | Freq: Once | INTRAVENOUS | Status: AC
  Administered 2019-06-01: 2 g via INTRAVENOUS
  Filled 2019-06-01: qty 50

## 2019-06-01 MED ORDER — LACTULOSE 10 GM/15ML PO SOLN: 20.0000 g | Freq: Three times a day (TID) | ORAL | 4 refills | Status: DC

## 2019-06-01 MED ORDER — RIFAXIMIN 550 MG PO TABS: 550.0000 mg | ORAL_TABLET | Freq: Two times a day (BID) | ORAL | 2 refills | Status: DC

## 2019-06-01 NOTE — Progress Notes (Signed)
.  nr  Subjective:  She has no complaints.  He states his bowels have not moved today because he got cleaned out from yesterday.  He denies nausea vomiting or abdominal pain.  He is not having any difficulty ambulating.  Objective: Blood pressure (!) 113/52, pulse 65, temperature 98.4 F (36.9 C), temperature source Oral, resp. rate 16, height 5' 7"  (1.702 m), weight 103.7 kg, SpO2 98 %. Patient is alert and in no acute distress. He does not have asterixis. Abdomen is full but soft and nontender. No LE edema noted.  Labs/studies Results:   CBC Latest Ref Rng & Units 06/01/2019 05/29/2019 05/28/2019  WBC 4.0 - 10.5 K/uL 3.6(L) 5.1 -  Hemoglobin 13.0 - 17.0 g/dL 12.0(L) 12.6(L) 12.2(L)  Hematocrit 39.0 - 52.0 % 35.8(L) 37.2(L) 36.0(L)  Platelets 150 - 400 K/uL 70(L) 75(L) -    CMP Latest Ref Rng & Units 06/01/2019 05/31/2019 05/30/2019  Glucose 70 - 99 mg/dL 92 - 81  BUN 8 - 23 mg/dL 16 - 17  Creatinine 0.61 - 1.24 mg/dL 0.95 - 0.93  Sodium 135 - 145 mmol/L 136 - 138  Potassium 3.5 - 5.1 mmol/L 3.7 - 3.7  Chloride 98 - 111 mmol/L 106 - 113(H)  CO2 22 - 32 mmol/L 22 - 21(L)  Calcium 8.9 - 10.3 mg/dL 8.7(L) - 8.0(L)  Total Protein 6.5 - 8.1 g/dL 5.5(L) 5.6(L) 5.5(L)  Total Bilirubin 0.3 - 1.2 mg/dL 2.8(H) 3.8(H) 4.0(H)  Alkaline Phos 38 - 126 U/L 82 80 78  AST 15 - 41 U/L 49(H) 55(H) 50(H)  ALT 0 - 44 U/L 29 31 30     Hepatic Function Latest Ref Rng & Units 06/01/2019 05/31/2019 05/30/2019  Total Protein 6.5 - 8.1 g/dL 5.5(L) 5.6(L) 5.5(L)  Albumin 3.5 - 5.0 g/dL 2.9(L) 3.0(L) 2.9(L)  AST 15 - 41 U/L 49(H) 55(H) 50(H)  ALT 0 - 44 U/L 29 31 30   Alk Phosphatase 38 - 126 U/L 82 80 78  Total Bilirubin 0.3 - 1.2 mg/dL 2.8(H) 3.8(H) 4.0(H)  Bilirubin, Direct 0.0 - 0.2 mg/dL - 1.0(H) -    Serum ammonia is 72.  It was 66 yesterday  Alpha-fetoprotein is pending.   Assessment:  #1.  Hepatic encephalopathy.  He appears to be at his baseline.  I asked patient's wife to let him do Stroop test  every day.  I showed them both how to do it.  She will download the free app.  #2.  Cirrhosis secondary to NASH.  Now that he has developed evidence of hepatic decompensation will refer him for transplant evaluation.  He is up-to-date on hepatitis A and B vaccination.  #3.  Asymptomatic cholelithiasis.  #4.  Obesity.  Once again patient reminded that he has to increase physical activity and to exercise every day.  He can do stationary bike at what ever he feels comfortable with.  Recommendations  Continue lactulose and Xifaxan at current dose. We will provide Xifaxan samples for few weeks. Hopefully this medication will be authorized by his insurance. Office visit in 2 weeks.  He will have blood work prior to the office visit.  My office will contact patient.

## 2019-06-01 NOTE — Plan of Care (Signed)
  Problem: Education: Goal: Knowledge of General Education information will improve Description: Including pain rating scale, medication(s)/side effects and non-pharmacologic comfort measures Outcome: Progressing   Problem: Health Behavior/Discharge Planning: Goal: Ability to manage health-related needs will improve Outcome: Progressing   Problem: Education: Goal: Knowledge of General Education information will improve Description: Including pain rating scale, medication(s)/side effects and non-pharmacologic comfort measures Outcome: Progressing   Problem: Education: Goal: Knowledge of General Education information will improve Description: Including pain rating scale, medication(s)/side effects and non-pharmacologic comfort measures Outcome: Progressing

## 2019-06-01 NOTE — Care Management Important Message (Signed)
Important Message  Patient Details  Name: JOBANI SABADO MRN: [id number] Date of Birth: 1951/01/15   Medicare Important Message Given:  Yes     Tommy Medal 06/01/2019, 1:57 PM

## 2019-06-01 NOTE — Discharge Instructions (Signed)
1) please take lactulose as prescribed--- goal is for you to have 2 to 3 mushy/loose bowel movements every day 2) constipation will lead to confusion and encephalopathy due to buildup of ammonia 3) please take rifaximin as prescribed 4) please follow-up with gastroenterologist Dr. Laural Golden in 2 weeks for possible referral to [facility] liver transplant team, he will need CBC and CMP blood work 1 day before your office visit with Dr. Laural Golden 5)Avoid ibuprofen/Advil/Aleve/Motrin/Goody Powders/Naproxen/BC powders/Meloxicam/Diclofenac/Indomethacin and other Nonsteroidal anti-inflammatory medications as these will make you more likely to bleed and can cause stomach ulcers, can also cause Kidney problems.

## 2019-06-01 NOTE — Discharge Summary (Signed)
Cody Austin, is a 69 y.o. male  DOB [birth year]  MRN [id number].  Admission date:  05/28/2019  Admitting Physician  Waldemar Dickens, MD  Discharge Date:  06/01/2019   Primary MD  [name], MD  Recommendations for primary care physician for things to follow:   1) please take lactulose as prescribed--- goal is for you to have 2 to 3 mushy/loose bowel movements every day 2) constipation will lead to confusion and encephalopathy due to buildup of ammonia 3) please take rifaximin as prescribed 4) please follow-up with gastroenterologist Dr. Laural Golden in 2 weeks for possible referral to [facility] liver transplant team, he will need CBC and CMP blood work 1 day before your office visit with Dr. Laural Golden 5)Avoid ibuprofen/Advil/Aleve/Motrin/Goody Powders/Naproxen/BC powders/Meloxicam/Diclofenac/Indomethacin and other Nonsteroidal anti-inflammatory medications as these will make you more likely to bleed and can cause stomach ulcers, can also cause Kidney problems.  Admission Diagnosis  Hepatic encephalopathy (HCC) [K72.90] Serum ammonia increased (HCC) [E72.20] Acute encephalopathy [G93.40] Altered mental status, unspecified altered mental status type [R41.82]   Discharge Diagnosis  Hepatic encephalopathy ([place]) [K72.90] Serum ammonia increased ([place]) [E72.20] Acute encephalopathy [G93.40] Altered mental status, unspecified altered mental status type [R41.82]   Principal Problem:   Acute hepatic encephalopathy Active Problems:   NASH Liver cirrhosis secondary to NASH (nonalcoholic steatohepatitis)   Peripheral arterial disease (HCC)   OSA (obstructive sleep apnea)   HLD (hyperlipidemia)   H/o COVID-19 Virus infection--- Dxed Ocotober 2020--- He is Not currently symptomatic      Past Medical History:  Diagnosis Date  . Arthritis   . Chronic kidney disease    left renal stone  . Cirrhosis, non-alcoholic ([place])     . Diabetes mellitus without complication ([place])    Pre-Diabetes per MD  . Fatty liver   . Heart murmur   . NASH Liver cirrhosis secondary to NASH (nonalcoholic steatohepatitis) 06/01/2019  . Other pancytopenia ([place]) 11/19/2014  . Peripheral arterial disease ([place]) 05/28/2019  . Sleep apnea    uses CPAP    Past Surgical History:  Procedure Laterality Date  . CATARACT EXTRACTION W/PHACO Right 08/12/2014   Procedure: CATARACT EXTRACTION PHACO AND INTRAOCULAR LENS PLACEMENT (IOC);  Surgeon: [name], MD;  Location: [facility] ORS;  Service: Ophthalmology;  Laterality: Right;  CDE 9.32  . CATARACT EXTRACTION W/PHACO Left 08/26/2014   Procedure: CATARACT EXTRACTION PHACO AND INTRAOCULAR LENS PLACEMENT (IOC);  Surgeon: [name], MD;  Location: [facility] ORS;  Service: Ophthalmology;  Laterality: Left;  CDE:  9.49  . COLONOSCOPY N/A 12/13/2014   Procedure: COLONOSCOPY;  Surgeon: Rogene Houston, MD;  Location: [facility] END[facility] SUITE;  Service: Endoscopy;  Laterality: N/A;  100  . ESOPHAGOGASTRODUODENOSCOPY N/A 12/13/2014   Procedure: ESOPHAGOGASTRODUODENOSCOPY (EGD);  Surgeon: Rogene Houston, MD;  Location: [facility];  Service: Endoscopy;  Laterality: N/A;  . HERNIA REPAIR Right    Inguinal hernia    HPI  from the history and physical done on the day of admission:    Patient coming from: home  Chief Complaint: AMS  Level 5 caveat. Unable to provide history. History provided to pts wife.   HPI: Cody Austin is a 69 y.o. male with medical history significant of CKD, Cirrhosis, DM, OSA, pancytopenia.   Pt was last reported nml at 09:00 this am. Shortly after that time. Pt began to shuffle his feet, appear unstable and slur his speech. Pt also came into the living room without pants attempting to grab his foot. EMS was called and pt was in his  Normal state of health at time of arrival. Per pt wife in the weeks leading up to admission he was experiencing a worsening tremor. Unsure of how many BMs he was having.  States pt had COVID back in October and had never been fully well since that time. No other complaints including fever, n/v/abd pain, dysuria.   ED Course: Objective findings outlined below.    Hospital Course:    Brief Narrative:  69 year old male with a history of [name] cirrhosis, admitted to the hospital with altered mental status.  He was found to have hepatic encephalopathy admitted to the hospital for further management with lactulose and Xifaxan.  Gastroenterology following.   Assessment & Plan:   Active Problems:   Hepatic cirrhosis (HCC)   Acute encephalopathy   Peripheral arterial disease (HCC)   OSA (obstructive sleep apnea)   HLD (hyperlipidemia)  1)New Onset Hepatic encephalopathy---  This is patient's first episode of encephalopathy.  Patient presented with altered mental status.  Ammonia on admission 275.  Started on lactulose as well as Xifaxan.  Ammonia is now down around 70 after multiple BMs  mental status continues to improve -Input from gastroenterologist Dr. Laural Golden appreciated -Patient will get rifaximin samples from Dr. Olevia Perches office  2)Nash cirrhosis--- now with cholestasis in the setting of asymptomatic cholelithiasis  -GI following.  Ultrasound noted,  CT abdomen with contrast without definite evidence of portal vein thrombosis, however portal vein is very small -INR is 1.5 -MELD score is 17.4--Dr. Laural Golden plans to refer to [facility] for liver transplant evaluation as outpatient  3)Obstructive sleep apnea.  Continue on CPAP  4)Thrombocytopenia.  Secondary to liver disease.  No signs of bleeding at this time.  5)Hypokalemia/hypomagnesemia--- in the setting of lactulose use, patient denies EtOH use--- replaced and rechecked  6) obesity--- contributing to NASH cirrhosis, dietary consult appreciated   Code Status: full code Family Communication: discussed with wife at the bedside  Disposition Plan: discharge home  -as per  gastroenterologist Dr. Laural Golden   Consultants:   Gastroenterology  Discharge Condition: Stable  Follow UP  Follow-up Information    Rogene Houston, MD. Schedule an appointment as soon as possible for a visit in 2 week(s).   Specialty: Gastroenterology Why: He will need CBC and CMP blood work 1 day before your office visit Contact information: [address], [address] [place] [address] [phone]          Diet and Activity recommendation:  As advised  Discharge Instructions     Discharge Instructions    Call MD for:  difficulty breathing, headache or visual disturbances   Complete by: As directed    Call MD for:  extreme fatigue   Complete by: As directed    Call MD for:  persistant dizziness or light-headedness   Complete by: As directed    Call MD for:  persistant nausea and vomiting   Complete by: As directed    Call MD for:  severe uncontrolled pain  Complete by: As directed    Call MD for:  temperature >100.4   Complete by: As directed    Diet - low sodium heart healthy   Complete by: As directed    Discharge instructions   Complete by: As directed    1) please take lactulose as prescribed--- goal is for you to have 2 to 3 mushy/loose bowel movements every day 2) constipation will lead to confusion and encephalopathy due to buildup of ammonia 3) please take rifaximin as prescribed 4) please follow-up with gastroenterologist Dr. Laural Golden as advised for possible referral to [facility] liver transplant team 5)Avoid ibuprofen/Advil/Aleve/Motrin/Goody Powders/Naproxen/BC powders/Meloxicam/Diclofenac/Indomethacin and other Nonsteroidal anti-inflammatory medications as these will make you more likely to bleed and can cause stomach ulcers, can also cause Kidney problems.   Increase activity slowly   Complete by: As directed         Discharge Medications     Allergies as of 06/01/2019   No Known Allergies     Medication List    TAKE these medications     atorvastatin 20 MG tablet Commonly known as: LIPITOR Take 20 mg by mouth daily.   lactulose 10 GM/15ML solution Commonly known as: CHRONULAC Take 30 mLs (20 g total) by mouth 3 (three) times daily.   rifaximin 550 MG Tabs tablet Commonly known as: XIFAXAN Take 1 tablet (550 mg total) by mouth 2 (two) times daily.   zinc gluconate 50 MG tablet Take 50 mg by mouth daily.      Major procedures and Radiology Reports - PLEASE review detailed and final reports for all details, in brief -   CT Angio Head W or Wo Contrast  Result Date: 05/28/2019 CLINICAL DATA:  Right-sided weakness. Altered mental status. EXAM: CT ANGIOGRAPHY HEAD AND NECK CT PERFUSION BRAIN TECHNIQUE: Multidetector CT imaging of the head and neck was performed using the standard protocol during bolus administration of intravenous contrast. Multiplanar CT image reconstructions and MIPs were obtained to evaluate the vascular anatomy. Carotid stenosis measurements (when applicable) are obtained utilizing NASCET criteria, using the distal internal carotid diameter as the denominator. Multiphase CT imaging of the brain was performed following IV bolus contrast injection. Subsequent parametric perfusion maps were calculated using RAPID software. CONTRAST:  161m OMNIPAQUE IOHEXOL 350 MG/ML SOLN COMPARISON:  None. FINDINGS: CTA NECK FINDINGS Aortic arch: Normal variant aortic arch branching pattern with common origin of the brachiocephalic and left common carotid arteries. Mild aortic atherosclerosis without significant arch vessel origin stenosis. Right carotid system: Patent with mild calcified plaque at the carotid bifurcation. No evidence of significant stenosis or dissection. Tortuous mid cervical ICA. Left carotid system: Patent with mild-to-moderate calcified plaque in the carotid bulb and minimal plaque in the distal cervical ICA. No evidence of significant stenosis or dissection. Tortuous distal cervical ICA. Vertebral arteries:  Patent and codominant with moderate to severe stenosis near the left V3-V4 junction. Skeleton: Cervical spondylosis with uncovertebral spurring resulting in advanced neural foraminal stenosis on the right at C5-6 and bilaterally at C6-7. At least mild spinal stenosis at C6-7 due to a broad-based posterior disc osteophyte complex. Other neck: No evidence of cervical lymphadenopathy or mass. Upper chest: Clear lung apices. Review of the MIP images confirms the above findings CTA HEAD FINDINGS Anterior circulation: The internal carotid arteries are patent from skull base to carotid termini with calcified plaque resulting in moderate left greater than right cavernous and mild-to-moderate left paraclinoid stenoses. ACAs and MCAs are patent without evidence of proximal branch occlusion or flow  limiting proximal stenosis. There is mild right A1 segment irregular narrowing versus artifact. No aneurysm is identified. Posterior circulation: The intracranial vertebral arteries are patent to the basilar with atherosclerosis bilaterally resulting in severe proximal V4 stenosis on the left. The right PICA and left AICA appear dominant. Patent SCA origins are seen bilaterally. The basilar artery is patent and congenitally small in caliber diffusely. There are large posterior communicating arteries with hypoplastic P1 segments bilaterally. Both PCAs are patent without evidence of flow limiting proximal stenosis. No aneurysm is identified. Venous sinuses: Patent. Anatomic variants: Predominantly fetal type origin of the PCAs. Review of the MIP images confirms the above findings CT Brain Perfusion Findings: ASPECTS: 10 CBF (<30%) Volume: 0 mL Perfusion (Tmax>6.0s) volume: 81 mL, primarily localizing to the superior cerebellum, brainstem, and right greater than left temporo-occipital regions and felt to reflect artifact Mismatch Volume: Not applicable given suspected artifact described above Infarction Location: Not applicable  IMPRESSION: 1. No large vessel occlusion. 2. Intracranial atherosclerosis with moderate bilateral ICA and severe left V4 stenoses. 3. Mild cervical carotid artery atherosclerosis without stenosis. 4. No core infarct or definite abnormal perfusion identified on CTP. 5.  Aortic Atherosclerosis (ICD10-I70.0). The preliminary finding of no LVO was communicated via telephone to Dr. Ezequiel Essex core on 05/28/2019 at 2:58 pm. Electronically Signed   By: Logan Bores M.D.   On: 05/28/2019 15:46   CT Angio Neck W and/or Wo Contrast  Result Date: 05/28/2019 CLINICAL DATA:  Right-sided weakness. Altered mental status. EXAM: CT ANGIOGRAPHY HEAD AND NECK CT PERFUSION BRAIN TECHNIQUE: Multidetector CT imaging of the head and neck was performed using the standard protocol during bolus administration of intravenous contrast. Multiplanar CT image reconstructions and MIPs were obtained to evaluate the vascular anatomy. Carotid stenosis measurements (when applicable) are obtained utilizing NASCET criteria, using the distal internal carotid diameter as the denominator. Multiphase CT imaging of the brain was performed following IV bolus contrast injection. Subsequent parametric perfusion maps were calculated using RAPID software. CONTRAST:  112m OMNIPAQUE IOHEXOL 350 MG/ML SOLN COMPARISON:  None. FINDINGS: CTA NECK FINDINGS Aortic arch: Normal variant aortic arch branching pattern with common origin of the brachiocephalic and left common carotid arteries. Mild aortic atherosclerosis without significant arch vessel origin stenosis. Right carotid system: Patent with mild calcified plaque at the carotid bifurcation. No evidence of significant stenosis or dissection. Tortuous mid cervical ICA. Left carotid system: Patent with mild-to-moderate calcified plaque in the carotid bulb and minimal plaque in the distal cervical ICA. No evidence of significant stenosis or dissection. Tortuous distal cervical ICA. Vertebral arteries: Patent  and codominant with moderate to severe stenosis near the left V3-V4 junction. Skeleton: Cervical spondylosis with uncovertebral spurring resulting in advanced neural foraminal stenosis on the right at C5-6 and bilaterally at C6-7. At least mild spinal stenosis at C6-7 due to a broad-based posterior disc osteophyte complex. Other neck: No evidence of cervical lymphadenopathy or mass. Upper chest: Clear lung apices. Review of the MIP images confirms the above findings CTA HEAD FINDINGS Anterior circulation: The internal carotid arteries are patent from skull base to carotid termini with calcified plaque resulting in moderate left greater than right cavernous and mild-to-moderate left paraclinoid stenoses. ACAs and MCAs are patent without evidence of proximal branch occlusion or flow limiting proximal stenosis. There is mild right A1 segment irregular narrowing versus artifact. No aneurysm is identified. Posterior circulation: The intracranial vertebral arteries are patent to the basilar with atherosclerosis bilaterally resulting in severe proximal V4 stenosis on the left.  The right PICA and left AICA appear dominant. Patent SCA origins are seen bilaterally. The basilar artery is patent and congenitally small in caliber diffusely. There are large posterior communicating arteries with hypoplastic P1 segments bilaterally. Both PCAs are patent without evidence of flow limiting proximal stenosis. No aneurysm is identified. Venous sinuses: Patent. Anatomic variants: Predominantly fetal type origin of the PCAs. Review of the MIP images confirms the above findings CT Brain Perfusion Findings: ASPECTS: 10 CBF (<30%) Volume: 0 mL Perfusion (Tmax>6.0s) volume: 81 mL, primarily localizing to the superior cerebellum, brainstem, and right greater than left temporo-occipital regions and felt to reflect artifact Mismatch Volume: Not applicable given suspected artifact described above Infarction Location: Not applicable IMPRESSION: 1.  No large vessel occlusion. 2. Intracranial atherosclerosis with moderate bilateral ICA and severe left V4 stenoses. 3. Mild cervical carotid artery atherosclerosis without stenosis. 4. No core infarct or definite abnormal perfusion identified on CTP. 5.  Aortic Atherosclerosis (ICD10-I70.0). The preliminary finding of no LVO was communicated via telephone to Dr. Ezequiel Essex core on 05/28/2019 at 2:58 pm. Electronically Signed   By: Logan Bores M.D.   On: 05/28/2019 15:46   US Abdomen Complete  Result Date: 05/30/2019 CLINICAL DATA:  Cirrhosis. EXAM: ABDOMEN ULTRASOUND COMPLETE COMPARISON:  Ultrasound 06/29/2018 FINDINGS: Gallbladder: Limited evaluation but evidence for echogenic stones. Common bile duct: Diameter: Not visualized. Liver: Liver is heterogeneous with a nodular contour. Findings are compatible with cirrhosis. Limited evaluation for a lesion due to body habitus. No significant flow in the main portal vein. IVC: No abnormality visualized. Pancreas: Limited evaluation. Spleen: Spleen is enlarged and evidence for varices around the spleen. Spleen measures 11.7 x 12.4 x 12.1 cm, calculated volume is 924 mL. Right Kidney: Length: 13.7 cm. Again noted is a central cyst in the right kidney that measures up to 2.2 cm. Negative for right hydronephrosis. Renal echogenicity is within normal limits. Limited evaluation for a renal lesion due to body habitus. Left Kidney: Length: 10.9 cm. Echogenicity within normal limits. No hydronephrosis. Limited evaluation. Abdominal aorta: No aneurysm visualized. Other findings: None. IMPRESSION: 1. Cholelithiasis.  Limited evaluation of the gallbladder. 2. Cirrhosis. Limited evaluation of the liver due to body habitus. Concern for portal vein occlusion and please refer to the liver duplex examination from the same day. 3. Splenomegaly with varices around the spleen. Findings are compatible with portal hypertension. 4. Limited evaluation of both kidneys.  Negative for  hydronephrosis. Electronically Signed   By: Markus Daft M.D.   On: 05/30/2019 17:56   CT ABDOMEN W WO CONTRAST  Result Date: 05/31/2019 CLINICAL DATA:  PORTAL VEIN THROMBOSIS. EXAM: CT ABDOMEN WITHOUT AND WITH CONTRAST TECHNIQUE: Multidetector CT imaging of the abdomen was performed following the standard protocol before and following the bolus administration of intravenous contrast. CONTRAST:  154m OMNIPAQUE IOHEXOL 300 MG/ML  SOLN COMPARISON:  ABDOMINAL ULTRASOUND 05/30/2019 FINDINGS: Lower chest: Unremarkable. Hepatobiliary: Nodular liver contour is compatible with cirrhosis. No evidence for arterial phase hyperenhancement. No enhancing lesion on later phase imaging. 7 mm hypodensity in the caudate lobe adjacent to the IVC is too small to characterize but likely benign. Calcified gallstones measure in the 5-6 mm size range. No intrahepatic or extrahepatic biliary dilation. Pancreas: 12 mm low-density lesion identified in the uncinate process of the pancreas. No dilatation of the main duct. Spleen: No splenomegaly. No focal mass lesion. Adrenals/Urinary Tract: No adrenal nodule or mass. 10 mm exophytic lesion upper pole right kidney is likely a cyst. 2 cm interpolar right renal  lesion approaches water density, compatible with cyst. Other similar tiny hypodense lesions are noted in the cortex of both kidneys. Stomach/Bowel: Stomach is unremarkable. No gastric wall thickening. No evidence of outlet obstruction. Duodenum is normally positioned as is the ligament of Treitz. No small bowel or colonic dilatation within the visualized abdomen. Vascular/Lymphatic: Aortic Atherosclerois (ICD10-170.0) Portal vein in the hepatoduodenal ligament is attenuated but opacifies. Left and right portal venous anatomy opacifies although right portal vein is quite diminutive. There is extensive venous collateralization in the left abdomen with evidence of splenorenal shunt. Paraesophageal varices are evident. Other: No  intraperitoneal free fluid. Musculoskeletal: No worrisome lytic or sclerotic osseous abnormality. IMPRESSION: 1. Cirrhotic changes in the liver with evidence of portal venous portal vein is hypertension including paraesophageal varices and splenorenal shunt. 2. Portal vein in the hepatoduodenal ligament is diminutive but does opacify suggesting patency. Opacification of the left and right portal venous anatomy is evident. 3. 12 mm low-density lesion in the uncinate process of the pancreas is stable since abdominal MRI of 03/30/2019. MRI recommendation at that time was for follow-up MRI in 2 years which would indicate repeat MRI in December of 2022. 4. Cholelithiasis. 5. Bilateral renal cysts. 6.  Aortic Atherosclerois (ICD10-170.0) Electronically Signed   By: Misty Stanley M.D.   On: 05/31/2019 12:12   CT CEREBRAL PERFUSION W CONTRAST  Result Date: 05/28/2019 CLINICAL DATA:  Right-sided weakness. Altered mental status. EXAM: CT ANGIOGRAPHY HEAD AND NECK CT PERFUSION BRAIN TECHNIQUE: Multidetector CT imaging of the head and neck was performed using the standard protocol during bolus administration of intravenous contrast. Multiplanar CT image reconstructions and MIPs were obtained to evaluate the vascular anatomy. Carotid stenosis measurements (when applicable) are obtained utilizing NASCET criteria, using the distal internal carotid diameter as the denominator. Multiphase CT imaging of the brain was performed following IV bolus contrast injection. Subsequent parametric perfusion maps were calculated using RAPID software. CONTRAST:  178m OMNIPAQUE IOHEXOL 350 MG/ML SOLN COMPARISON:  None. FINDINGS: CTA NECK FINDINGS Aortic arch: Normal variant aortic arch branching pattern with common origin of the brachiocephalic and left common carotid arteries. Mild aortic atherosclerosis without significant arch vessel origin stenosis. Right carotid system: Patent with mild calcified plaque at the carotid bifurcation. No  evidence of significant stenosis or dissection. Tortuous mid cervical ICA. Left carotid system: Patent with mild-to-moderate calcified plaque in the carotid bulb and minimal plaque in the distal cervical ICA. No evidence of significant stenosis or dissection. Tortuous distal cervical ICA. Vertebral arteries: Patent and codominant with moderate to severe stenosis near the left V3-V4 junction. Skeleton: Cervical spondylosis with uncovertebral spurring resulting in advanced neural foraminal stenosis on the right at C5-6 and bilaterally at C6-7. At least mild spinal stenosis at C6-7 due to a broad-based posterior disc osteophyte complex. Other neck: No evidence of cervical lymphadenopathy or mass. Upper chest: Clear lung apices. Review of the MIP images confirms the above findings CTA HEAD FINDINGS Anterior circulation: The internal carotid arteries are patent from skull base to carotid termini with calcified plaque resulting in moderate left greater than right cavernous and mild-to-moderate left paraclinoid stenoses. ACAs and MCAs are patent without evidence of proximal branch occlusion or flow limiting proximal stenosis. There is mild right A1 segment irregular narrowing versus artifact. No aneurysm is identified. Posterior circulation: The intracranial vertebral arteries are patent to the basilar with atherosclerosis bilaterally resulting in severe proximal V4 stenosis on the left. The right PICA and left AICA appear dominant. Patent SCA origins are seen bilaterally.  The basilar artery is patent and congenitally small in caliber diffusely. There are large posterior communicating arteries with hypoplastic P1 segments bilaterally. Both PCAs are patent without evidence of flow limiting proximal stenosis. No aneurysm is identified. Venous sinuses: Patent. Anatomic variants: Predominantly fetal type origin of the PCAs. Review of the MIP images confirms the above findings CT Brain Perfusion Findings: ASPECTS: 10 CBF (<30%)  Volume: 0 mL Perfusion (Tmax>6.0s) volume: 81 mL, primarily localizing to the superior cerebellum, brainstem, and right greater than left temporo-occipital regions and felt to reflect artifact Mismatch Volume: Not applicable given suspected artifact described above Infarction Location: Not applicable IMPRESSION: 1. No large vessel occlusion. 2. Intracranial atherosclerosis with moderate bilateral ICA and severe left V4 stenoses. 3. Mild cervical carotid artery atherosclerosis without stenosis. 4. No core infarct or definite abnormal perfusion identified on CTP. 5.  Aortic Atherosclerosis (ICD10-I70.0). The preliminary finding of no LVO was communicated via telephone to Dr. Ezequiel Essex core on 05/28/2019 at 2:58 pm. Electronically Signed   By: Logan Bores M.D.   On: 05/28/2019 15:46   US LIVER DOPPLER  Result Date: 05/30/2019 CLINICAL DATA:  69 year old with cirrhosis. EXAM: DUPLEX ULTRASOUND OF LIVER TECHNIQUE: Color and duplex Doppler ultrasound was performed to evaluate the hepatic in-flow and out-flow vessels. COMPARISON:  Ultrasound 05/30/2019.  MRI 04/26/2017 FINDINGS: Liver: Liver is poorly visualized on this examination due to body habitus. Liver has a nodular contour and compatible with cirrhosis. Main Portal Vein size: 0.8 cm Portal Vein Velocities Main Prox: None Main Mid: None Main Dist: None Right: Limited evaluation Left: Limited evaluation Hepatic Vein Velocities Right:  21 cm/sec Middle:  47 cm/sec Left:  21 cm/sec IVC: Present and patent with normal respiratory phasicity. Hepatic Artery Velocity:  80 cm/sec Splenic Vein Velocity:  35 cm/sec Spleen: Refer to abdominal ultrasound from same day. Portal Vein Occlusion/Thrombus: Yes Splenic Vein Occlusion/Thrombus: No Ascites: None Varices: Not clear No definite flow in the main portal vein. Question a small amount of flow in the intrahepatic portal venous system. Hepatic veins appear to be patent with hepatofugal flow. Evidence for echogenic  gallstones. IMPRESSION: 1. No definite blood flow in the main portal vein. Findings are concerning for portal vein occlusion. Study has technical limitations due to body habitus. Portal vein occlusion could be confirmed with a post contrast CT or MRI. 2. Nodular contour of the liver is suggestive for cirrhosis. 3. Cholelithiasis. Electronically Signed   By: Markus Daft M.D.   On: 05/30/2019 17:47   CT HEAD CODE STROKE WO CONTRAST  Result Date: 05/28/2019 CLINICAL DATA:  Code stroke. Right-sided weakness. Altered mental status. EXAM: CT HEAD WITHOUT CONTRAST TECHNIQUE: Contiguous axial images were obtained from the base of the skull through the vertex without intravenous contrast. COMPARISON:  None. FINDINGS: Brain: The study is mildly motion degraded despite repeat imaging. Within this limitation, no acute infarct, intracranial hemorrhage, mass, midline shift, or extra-axial fluid collection is identified. The ventricles and sulci are within normal limits for age. Hypodensities in the cerebral white matter bilaterally are nonspecific but compatible with mild chronic small vessel ischemic disease. Vascular: Calcified atherosclerosis at the skull base. No hyperdense vessel. Skull: No fracture or suspicious osseous lesion. Sinuses/Orbits: Visualized paranasal sinuses and mastoid air cells are clear. Bilateral cataract extraction is noted. Other: None. ASPECTS [facility] Stroke Program Early CT Score) - Ganglionic level infarction (caudate, lentiform nuclei, internal capsule, insula, M1-M3 cortex): 7 - Supraganglionic infarction (M4-M6 cortex): 3 Total score (0-10 with 10 being normal): 10 IMPRESSION:  1. No evidence of acute intracranial abnormality. 2. ASPECTS is 10. 3. Mild chronic small vessel ischemic disease. These results were called by telephone at the time of interpretation on 05/28/2019 at 2:58 pm to Dr. Ezequiel Essex , who verbally acknowledged these results. Electronically Signed   By: Logan Bores M.D.   On:  05/28/2019 15:04    Micro Results    Recent Results (from the past 240 hour(s))  Respiratory Panel by RT PCR (Flu A&B, Covid) - Nasopharyngeal Swab     Status: None   Collection Time: 05/28/19  2:51 PM   Specimen: Nasopharyngeal Swab  Result Value Ref Range Status   SARS Coronavirus 2 by RT PCR NEGATIVE NEGATIVE Final    Comment: (NOTE) SARS-CoV-2 target nucleic acids are NOT DETECTED. The SARS-CoV-2 RNA is generally detectable in upper respiratoy specimens during the acute phase of infection. The lowest concentration of SARS-CoV-2 viral copies this assay can detect is 131 copies/mL. A negative result does not preclude SARS-Cov-2 infection and should not be used as the sole basis for treatment or other patient management decisions. A negative result may occur with  improper specimen collection/handling, submission of specimen other than nasopharyngeal swab, presence of viral mutation(s) within the areas targeted by this assay, and inadequate number of viral copies (<131 copies/mL). A negative result must be combined with clinical observations, patient history, and epidemiological information. The expected result is Negative. Fact Sheet for Patients:  PinkCheek.be Fact Sheet for Healthcare Providers:  GravelBags.it This test is not yet ap proved or cleared by the [place] FDA and  has been authorized for detection and/or diagnosis of SARS-CoV-2 by FDA under an Emergency Use Authorization (EUA). This EUA will remain  in effect (meaning this test can be used) for the duration of the COVID-19 declaration under Section 564(b)(1) of the Act, 21 U.S.C. section 360bbb-3(b)(1), unless the authorization is terminated or revoked sooner.    Influenza A by PCR NEGATIVE NEGATIVE Final   Influenza B by PCR NEGATIVE NEGATIVE Final    Comment: (NOTE) The Xpert Xpress SARS-CoV-2/FLU/RSV assay is intended as an aid in  the diagnosis  of influenza from Nasopharyngeal swab specimens and  should not be used as a sole basis for treatment. Nasal washings and  aspirates are unacceptable for Xpert Xpress SARS-CoV-2/FLU/RSV  testing. Fact Sheet for Patients: PinkCheek.be Fact Sheet for Healthcare Providers: GravelBags.it This test is not yet approved or cleared by the [place] FDA and  has been authorized for detection and/or diagnosis of SARS-CoV-2 by  FDA under an Emergency Use Authorization (EUA). This EUA will remain  in effect (meaning this test can be used) for the duration of the  Covid-19 declaration under Section 564(b)(1) of the Act, 21  U.S.C. section 360bbb-3(b)(1), unless the authorization is  terminated or revoked. Performed at [facility], [address] [place], [place] [address]   MRSA PCR Screening     Status: None   Collection Time: 05/28/19 10:10 PM   Specimen: Nasal Mucosa; Nasopharyngeal  Result Value Ref Range Status   MRSA by PCR NEGATIVE NEGATIVE Final    Comment:        The GeneXpert MRSA Assay (FDA approved for NASAL specimens only), is one component of a comprehensive MRSA colonization surveillance program. It is not intended to diagnose MRSA infection nor to guide or monitor treatment for MRSA infections. Performed at [facility], [address], [place], [place] [address]     Today   Subjective  Galvin Proffer today has no complaints -Mentation has improved significantly with lactulose and BMs     Patient has been seen and examined prior to discharge   Objective   Blood pressure (!) 113/52, pulse 65, temperature 98.4 F (36.9 C), temperature source Oral, resp. rate 16, height 5' 7"  (1.702 m), weight 103.7 kg, SpO2 98 %.   Intake/Output Summary (Last 24 hours) at 06/01/2019 1200 Last data filed at 06/01/2019 0908 Gross per 24 hour  Intake 720 ml  Output --  Net 720 ml    Exam Gen:- Awake Alert, no acute  distress  HEENT:- [place].AT, No sclera icterus Neck-Supple Neck,No JVD,.  Lungs-  CTAB , good air movement bilaterally  CV- S1, S2 normal, regular Abd-  +ve B.Sounds, Abd Soft, No tenderness,    Extremity/Skin:- No  edema,   good pulses Psych-affect is appropriate, oriented x3 Neuro-no new focal deficits, no tremors    Data Review   CBC w Diff:  Lab Results  Component Value Date   WBC 3.6 (L) 06/01/2019   HGB 12.0 (L) 06/01/2019   HGB 14.0 11/15/2016   HCT 35.8 (L) 06/01/2019   HCT 40.8 11/15/2016   PLT 70 (L) 06/01/2019   PLT 99 (LL) 11/15/2016   LYMPHOPCT 24 05/28/2019   MONOPCT 14 05/28/2019   EOSPCT 3 05/28/2019   BASOPCT 1 05/28/2019    CMP:  Lab Results  Component Value Date   NA 136 06/01/2019   K 3.7 06/01/2019   CL 106 06/01/2019   CO2 22 06/01/2019   BUN 16 06/01/2019   CREATININE 0.95 06/01/2019   PROT 5.5 (L) 06/01/2019   PROT 7.0 11/15/2016   ALBUMIN 2.9 (L) 06/01/2019   ALBUMIN 4.2 11/15/2016   BILITOT 2.8 (H) 06/01/2019   BILITOT 1.7 (H) 11/15/2016   ALKPHOS 82 06/01/2019   AST 49 (H) 06/01/2019   ALT 29 06/01/2019  .   Total Discharge time is about 33 minutes  Roxan Hockey M.D on 06/01/2019 at 12:00 PM  Go to [email or web address] -  for contact info  [facility] - Office  [phone]

## 2019-06-01 NOTE — Progress Notes (Signed)
Nsg Discharge Note  Admit Date:  05/28/2019 Discharge date: 06/01/2019   Cody Austin to be D/C'd Home per MD order.  AVS completed.  Copy for chart, and copy for patient signed, and dated. Removed IVs-clean, dry, intact. Rip Harbour the [employer] nurse reviewed d/c paperwork with patient and wife. Reviewed new medications and gave printed drug info on Rifaximin. Wheeled stable patient and belongings to short stay entrance where he was picked up by wife. Patient/caregiver able to verbalize understanding.  Discharge Medication: Allergies as of 06/01/2019   No Known Allergies     Medication List    TAKE these medications   atorvastatin 20 MG tablet Commonly known as: LIPITOR Take 20 mg by mouth daily.   lactulose 10 GM/15ML solution Commonly known as: CHRONULAC Take 30 mLs (20 g total) by mouth 3 (three) times daily.   rifaximin 550 MG Tabs tablet Commonly known as: XIFAXAN Take 1 tablet (550 mg total) by mouth 2 (two) times daily.   zinc gluconate 50 MG tablet Take 50 mg by mouth daily.       Discharge Assessment: Vitals:   05/31/19 2132 06/01/19 0649  BP: 136/68 (!) 113/52  Pulse: 82 65  Resp: 16 16  Temp: 98.5 F (36.9 C) 98.4 F (36.9 C)  SpO2: 100% 98%   Skin clean, dry and intact without evidence of skin break down, no evidence of skin tears noted. IV catheter discontinued intact. Site without signs and symptoms of complications - no redness or edema noted at insertion site, patient denies c/o pain - only slight tenderness at site.  Dressing with slight pressure applied.  D/c Instructions-Education: Discharge instructions given to patient/family with verbalized understanding. D/c education completed with patient/family including follow up instructions, medication list, d/c activities limitations if indicated, with other d/c instructions as indicated by MD - patient able to verbalize understanding, all questions fully answered. Patient instructed to return to ED, call 911,  or call MD for any changes in condition.  Patient escorted via [place], and D/C home via private auto.  Santa Lighter, RN 06/01/2019 12:58 PM

## 2019-06-01 NOTE — Plan of Care (Signed)
  Problem: Education: Goal: Knowledge of General Education information will improve Description: Including pain rating scale, medication(s)/side effects and non-pharmacologic comfort measures 06/01/2019 1056 by Santa Lighter, RN Outcome: Adequate for Discharge 06/01/2019 1054 by Santa Lighter, RN Outcome: Progressing   Problem: Clinical Measurements: Goal: Ability to maintain clinical measurements within normal limits will improve 06/01/2019 1056 by Santa Lighter, RN Outcome: Adequate for Discharge 06/01/2019 1054 by Santa Lighter, RN Outcome: Progressing Goal: Will remain free from infection Outcome: Adequate for Discharge Goal: Diagnostic test results will improve Outcome: Adequate for Discharge Goal: Respiratory complications will improve Outcome: Adequate for Discharge Goal: Cardiovascular complication will be avoided Outcome: Adequate for Discharge   Problem: Activity: Goal: Risk for activity intolerance will decrease Outcome: Adequate for Discharge   Problem: Nutrition: Goal: Adequate nutrition will be maintained Outcome: Adequate for Discharge   Problem: Coping: Goal: Level of anxiety will decrease Outcome: Adequate for Discharge   Problem: Elimination: Goal: Will not experience complications related to bowel motility Outcome: Adequate for Discharge Goal: Will not experience complications related to urinary retention Outcome: Adequate for Discharge   Problem: Pain Managment: Goal: General experience of comfort will improve Outcome: Adequate for Discharge   Problem: Safety: Goal: Ability to remain free from injury will improve Outcome: Adequate for Discharge   Problem: Skin Integrity: Goal: Risk for impaired skin integrity will decrease Outcome: Adequate for Discharge

## 2019-06-02 LAB — AFP TUMOR MARKER: AFP, Serum, Tumor Marker: 7.7 ng/mL (ref 0.0–8.3)

## 2019-06-04 ENCOUNTER — Telehealth (INDEPENDENT_AMBULATORY_CARE_PROVIDER_SITE_OTHER): Payer: Self-pay | Admitting: Internal Medicine

## 2019-06-04 NOTE — Telephone Encounter (Signed)
Dr Laural Golden wants to see this patient on 2/25 - wants labs done before visit (CBC, LFT, INR, Imonia and blood group type)

## 2019-06-05 ENCOUNTER — Other Ambulatory Visit (INDEPENDENT_AMBULATORY_CARE_PROVIDER_SITE_OTHER): Payer: Self-pay | Admitting: *Deleted

## 2019-06-05 DIAGNOSIS — R772 Abnormality of alphafetoprotein: Secondary | ICD-10-CM

## 2019-06-05 DIAGNOSIS — K769 Liver disease, unspecified: Secondary | ICD-10-CM

## 2019-06-05 DIAGNOSIS — K760 Fatty (change of) liver, not elsewhere classified: Secondary | ICD-10-CM

## 2019-06-05 DIAGNOSIS — K7460 Unspecified cirrhosis of liver: Secondary | ICD-10-CM

## 2019-06-05 DIAGNOSIS — K862 Cyst of pancreas: Secondary | ICD-10-CM

## 2019-06-05 NOTE — Telephone Encounter (Signed)
All labs have ben ordered per Dr.Rehman. Patient will have done 06/11/2019. Patient was called and made aware.

## 2019-06-07 ENCOUNTER — Other Ambulatory Visit: Payer: Self-pay

## 2019-06-07 NOTE — Patient Outreach (Signed)
Cody Austin: Reason for alert:  Unfilled RX.  Reviewed medical record and discharge plan.  Placed call to patient who answered and identified himself.  Patient reports that he is waiting for authorization from insurance to get RX filled. Reports that he has samples provided by MD.  Reports wife has been in contact with insurance provider.    Patient reports he has follow up planned with MD next week and has medication to last until he gets to that appointment.  PLAN: close case as no needs identified at this time.  Tomasa Rand, RN, BSN, CEN [facility] [employer] [phone]

## 2019-06-08 DIAGNOSIS — L821 Other seborrheic keratosis: Secondary | ICD-10-CM | POA: Diagnosis not present

## 2019-06-08 DIAGNOSIS — L578 Other skin changes due to chronic exposure to nonionizing radiation: Secondary | ICD-10-CM | POA: Diagnosis not present

## 2019-06-08 DIAGNOSIS — L820 Inflamed seborrheic keratosis: Secondary | ICD-10-CM | POA: Diagnosis not present

## 2019-06-08 DIAGNOSIS — Z23 Encounter for immunization: Secondary | ICD-10-CM | POA: Diagnosis not present

## 2019-06-11 DIAGNOSIS — K7460 Unspecified cirrhosis of liver: Secondary | ICD-10-CM | POA: Diagnosis not present

## 2019-06-11 DIAGNOSIS — K769 Liver disease, unspecified: Secondary | ICD-10-CM | POA: Diagnosis not present

## 2019-06-11 DIAGNOSIS — R772 Abnormality of alphafetoprotein: Secondary | ICD-10-CM | POA: Diagnosis not present

## 2019-06-11 DIAGNOSIS — K760 Fatty (change of) liver, not elsewhere classified: Secondary | ICD-10-CM | POA: Diagnosis not present

## 2019-06-11 DIAGNOSIS — K862 Cyst of pancreas: Secondary | ICD-10-CM | POA: Diagnosis not present

## 2019-06-12 LAB — HEPATIC FUNCTION PANEL
AG Ratio: 1.3 (calc) (ref 1.0–2.5)
ALT: 33 U/L (ref 9–46)
AST: 52 U/L — ABNORMAL HIGH (ref 10–35)
Albumin: 3.1 g/dL — ABNORMAL LOW (ref 3.6–5.1)
Alkaline phosphatase (APISO): 71 U/L (ref 35–144)
Bilirubin, Direct: 0.7 mg/dL — ABNORMAL HIGH (ref 0.0–0.2)
Globulin: 2.4 g/dL (calc) (ref 1.9–3.7)
Indirect Bilirubin: 1.1 mg/dL (calc) (ref 0.2–1.2)
Total Bilirubin: 1.8 mg/dL — ABNORMAL HIGH (ref 0.2–1.2)
Total Protein: 5.5 g/dL — ABNORMAL LOW (ref 6.1–8.1)

## 2019-06-12 LAB — CBC
HCT: 33.7 % — ABNORMAL LOW (ref 38.5–50.0)
Hemoglobin: 11.7 g/dL — ABNORMAL LOW (ref 13.2–17.1)
MCH: 34.4 pg — ABNORMAL HIGH (ref 27.0–33.0)
MCHC: 34.7 g/dL (ref 32.0–36.0)
MCV: 99.1 fL (ref 80.0–100.0)
MPV: 11.3 fL (ref 7.5–12.5)
Platelets: 82 10*3/uL — ABNORMAL LOW (ref 140–400)
RBC: 3.4 10*6/uL — ABNORMAL LOW (ref 4.20–5.80)
RDW: 13.6 % (ref 11.0–15.0)
WBC: 3.1 10*3/uL — ABNORMAL LOW (ref 3.8–10.8)

## 2019-06-12 LAB — ABO AND RH

## 2019-06-12 LAB — PROTIME-INR
INR: 1.5 — ABNORMAL HIGH
Prothrombin Time: 14.9 s — ABNORMAL HIGH (ref 9.0–11.5)

## 2019-06-14 ENCOUNTER — Ambulatory Visit (INDEPENDENT_AMBULATORY_CARE_PROVIDER_SITE_OTHER): Payer: Medicare Other | Admitting: Internal Medicine

## 2019-06-18 ENCOUNTER — Ambulatory Visit (INDEPENDENT_AMBULATORY_CARE_PROVIDER_SITE_OTHER): Payer: Medicare Other | Admitting: Internal Medicine

## 2019-06-18 ENCOUNTER — Other Ambulatory Visit: Payer: Self-pay

## 2019-06-18 ENCOUNTER — Encounter (INDEPENDENT_AMBULATORY_CARE_PROVIDER_SITE_OTHER): Payer: Self-pay | Admitting: Internal Medicine

## 2019-06-18 VITALS — BP 112/67 | HR 51 | Temp 97.2°F | Ht 67.0 in | Wt 221.3 lb

## 2019-06-18 DIAGNOSIS — K7460 Unspecified cirrhosis of liver: Secondary | ICD-10-CM

## 2019-06-18 DIAGNOSIS — D696 Thrombocytopenia, unspecified: Secondary | ICD-10-CM | POA: Diagnosis not present

## 2019-06-18 DIAGNOSIS — K7682 Hepatic encephalopathy: Secondary | ICD-10-CM

## 2019-06-18 DIAGNOSIS — D61818 Other pancytopenia: Secondary | ICD-10-CM | POA: Diagnosis not present

## 2019-06-18 DIAGNOSIS — D638 Anemia in other chronic diseases classified elsewhere: Secondary | ICD-10-CM

## 2019-06-18 DIAGNOSIS — K7290 Hepatic failure, unspecified without coma: Secondary | ICD-10-CM

## 2019-06-18 NOTE — Progress Notes (Signed)
Presenting complaint;  Follow-up to recent hospitalization for new onset of hepatic encephalopathy  Database and subjective:  Patient is 69 year old Caucasian male who was diagnosed with cirrhosis back in August 2016 when he was evaluated by Dr. Everardo All for pancytopenia.  Biochemical markers were all negative.  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  And he has done well.  He was last seen in office in August 2020.  He was admitted to [facility] on 05/28/2019 for new onset of hepatic encephalopathy.  There was no obvious trigger.  Imaging studies were negative for portal or hepatic vein thrombosis.  He responded to medical therapy.  He was discharged on 06/01/2019. He is accompanied by his wife Chrys Racer today. He has no complaints.  She reports he has not had any more episodes of confusion.  He is using stationary bike and currently exercising for 20 minutes daily.  He is having 2-4 bowel movements per day.  He denies abdominal pain melena or rectal bleeding.  He received second dose of Covid vaccine last week and had self-limiting bout of diarrhea without fever or vomiting. He he had blood work as planned. He states he has changed his eating habits completely.  Not only he is watching salt intake but he is also cut back on calories and portions.  He has lost 7 pounds in the last 3 weeks.  Current Medications: Outpatient Encounter Medications as of 06/18/2019  Medication Sig  . atorvastatin (LIPITOR) 20 MG tablet Take 20 mg by mouth daily.   . fenofibrate (TRICOR) 145 MG tablet Take 145 mg by mouth daily.  Marland Kitchen lactulose (CHRONULAC) 10 GM/15ML solution Take 30 mLs (20 g total) by mouth 3 (three) times daily.  . Omega-3 Fatty Acids (OMEGA 3 500 PO) Take by mouth daily.  . rifaximin (XIFAXAN) 550 MG TABS tablet Take 1 tablet (550 mg total) by mouth 2 (two) times daily.  Marland Kitchen zinc gluconate 50 MG tablet Take 50 mg by mouth daily.   No facility-administered encounter medications on file as of 06/18/2019.      Objective: Blood pressure 112/67, pulse (!) 51, temperature (!) 97.2 F (36.2 C), temperature source Temporal, height 5' 7"  (1.702 m), weight 221 lb 4.8 oz (100.4 kg). Patient is alert and in no acute distress. He is wearing facial mask. He does not have asterixis. Conjunctiva is pink. Sclera is nonicteric Oropharyngeal mucosa is normal. No neck masses or thyromegaly noted. Cardiac exam with regular rhythm normal S1 and S2.  He has faint systolic murmur best heard at aortic area. Lungs are clear to auscultation. Abdomen is full but soft and nontender with organomegaly or masses.  He does not have shifting dullness. No LE edema or clubbing noted.  Labs/studies Results:  CBC Latest Ref Rng & Units 06/11/2019 06/01/2019 05/29/2019  WBC 3.8 - 10.8 Thousand/uL 3.1(L) 3.6(L) 5.1  Hemoglobin 13.2 - 17.1 g/dL 11.7(L) 12.0(L) 12.6(L)  Hematocrit 38.5 - 50.0 % 33.7(L) 35.8(L) 37.2(L)  Platelets 140 - 400 Thousand/uL 82(L) 70(L) 75(L)    CMP Latest Ref Rng & Units 06/11/2019 06/01/2019 05/31/2019  Glucose 70 - 99 mg/dL - 92 -  BUN 8 - 23 mg/dL - 16 -  Creatinine 0.61 - 1.24 mg/dL - 0.95 -  Sodium 135 - 145 mmol/L - 136 -  Potassium 3.5 - 5.1 mmol/L - 3.7 -  Chloride 98 - 111 mmol/L - 106 -  CO2 22 - 32 mmol/L - 22 -  Calcium 8.9 - 10.3 mg/dL - 8.7(L) -  Total Protein 6.1 - 8.1 g/dL 5.5(L) 5.5(L) 5.6(L)  Total Bilirubin 0.2 - 1.2 mg/dL 1.8(H) 2.8(H) 3.8(H)  Alkaline Phos 38 - 126 U/L - 82 80  AST 10 - 35 U/L 52(H) 49(H) 55(H)  ALT 9 - 46 U/L 33 29 31    Hepatic Function Latest Ref Rng & Units 06/11/2019 06/01/2019 05/31/2019  Total Protein 6.1 - 8.1 g/dL 5.5(L) 5.5(L) 5.6(L)  Albumin 3.5 - 5.0 g/dL - 2.9(L) 3.0(L)  AST 10 - 35 U/L 52(H) 49(H) 55(H)  ALT 9 - 46 U/L 33 29 31  Alk Phosphatase 38 - 126 U/L - 82 80  Total Bilirubin 0.2 - 1.2 mg/dL 1.8(H) 2.8(H) 3.8(H)  Bilirubin, Direct 0.0 - 0.2 mg/dL 0.7(H) - 1.0(H)    INR 1.5.  Assessment:  #1.  Recent hospitalization for hepatic  encephalopathy.  No obvious precipitating event but few months ago he was diagnosed with Covid and had some symptoms.  He is presently on lactulose and Xifaxan.  Hepatic function has improved as evidenced by slight decrease in his INR.   Since he has developed complication of cirrhosis it would be a good time for him to be referred for transplant evaluation.  #2.  Cirrhosis felt to be secondary to NAFLD.  He has received hepatitis a and B vaccination.  He is due for esophagogastroduodenoscopy to screen for esophageal varices.  Last EGD was in August 2016 and was negative for varices.  #3.  History of colonic adenomas.  He had 5 small polyps removed in August 2016 and 2 polyps are tubular adenomas.  He will be due for surveillance colonoscopy this year.  #4.  Anemia secondary to chronic disease.  #5.  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  Plan:  Referral to [facility] for transplant evaluation. Patient will continue lactulose and Xifaxan at current dose. He will go to the lab for serum ammonia.  I am not sure why it was not done with rest of his blood work last week. He will have CBC, LFTs, metabolic 7, INR and serum ammonia prior to next visit in 2 months. We will plan esophagogastroduodenoscopy and colonoscopy after next visit.

## 2019-06-18 NOTE — Patient Instructions (Signed)
Physician will call with results of blood test when completed. Next blood work will be prior to next visit in 2 months. We will plan for EGD and colonoscopy after next visit.

## 2019-06-20 LAB — AMMONIA: Ammonia: 55 umol/L (ref <=72)

## 2019-06-21 ENCOUNTER — Encounter (INDEPENDENT_AMBULATORY_CARE_PROVIDER_SITE_OTHER): Payer: Self-pay | Admitting: *Deleted

## 2019-07-09 ENCOUNTER — Encounter (INDEPENDENT_AMBULATORY_CARE_PROVIDER_SITE_OTHER): Payer: Self-pay | Admitting: *Deleted

## 2019-07-09 ENCOUNTER — Other Ambulatory Visit (INDEPENDENT_AMBULATORY_CARE_PROVIDER_SITE_OTHER): Payer: Self-pay | Admitting: *Deleted

## 2019-07-09 DIAGNOSIS — K7682 Hepatic encephalopathy: Secondary | ICD-10-CM

## 2019-07-09 DIAGNOSIS — K7290 Hepatic failure, unspecified without coma: Secondary | ICD-10-CM

## 2019-07-09 DIAGNOSIS — K7460 Unspecified cirrhosis of liver: Secondary | ICD-10-CM

## 2019-07-09 DIAGNOSIS — D638 Anemia in other chronic diseases classified elsewhere: Secondary | ICD-10-CM

## 2019-08-08 ENCOUNTER — Other Ambulatory Visit (INDEPENDENT_AMBULATORY_CARE_PROVIDER_SITE_OTHER): Payer: Self-pay | Admitting: *Deleted

## 2019-08-08 DIAGNOSIS — K7460 Unspecified cirrhosis of liver: Secondary | ICD-10-CM

## 2019-08-08 DIAGNOSIS — K7290 Hepatic failure, unspecified without coma: Secondary | ICD-10-CM

## 2019-08-08 DIAGNOSIS — K7682 Hepatic encephalopathy: Secondary | ICD-10-CM

## 2019-08-08 DIAGNOSIS — D638 Anemia in other chronic diseases classified elsewhere: Secondary | ICD-10-CM

## 2019-08-13 DIAGNOSIS — E785 Hyperlipidemia, unspecified: Secondary | ICD-10-CM | POA: Diagnosis not present

## 2019-08-13 DIAGNOSIS — K7290 Hepatic failure, unspecified without coma: Secondary | ICD-10-CM | POA: Diagnosis not present

## 2019-08-13 DIAGNOSIS — D638 Anemia in other chronic diseases classified elsewhere: Secondary | ICD-10-CM | POA: Diagnosis not present

## 2019-08-13 DIAGNOSIS — Z6831 Body mass index (BMI) 31.0-31.9, adult: Secondary | ICD-10-CM | POA: Diagnosis not present

## 2019-08-13 DIAGNOSIS — Z1389 Encounter for screening for other disorder: Secondary | ICD-10-CM | POA: Diagnosis not present

## 2019-08-13 DIAGNOSIS — D61818 Other pancytopenia: Secondary | ICD-10-CM | POA: Diagnosis not present

## 2019-08-13 DIAGNOSIS — R7309 Other abnormal glucose: Secondary | ICD-10-CM | POA: Diagnosis not present

## 2019-08-13 DIAGNOSIS — D696 Thrombocytopenia, unspecified: Secondary | ICD-10-CM | POA: Diagnosis not present

## 2019-08-13 DIAGNOSIS — K7460 Unspecified cirrhosis of liver: Secondary | ICD-10-CM | POA: Diagnosis not present

## 2019-08-13 DIAGNOSIS — I10 Essential (primary) hypertension: Secondary | ICD-10-CM | POA: Diagnosis not present

## 2019-08-13 DIAGNOSIS — E6609 Other obesity due to excess calories: Secondary | ICD-10-CM | POA: Diagnosis not present

## 2019-08-15 ENCOUNTER — Other Ambulatory Visit: Payer: Self-pay | Admitting: Family Medicine

## 2019-08-15 ENCOUNTER — Other Ambulatory Visit (HOSPITAL_COMMUNITY): Payer: Self-pay | Admitting: Family Medicine

## 2019-08-15 DIAGNOSIS — R0989 Other specified symptoms and signs involving the circulatory and respiratory systems: Secondary | ICD-10-CM

## 2019-08-15 LAB — CBC
Hematocrit: 38.7 % (ref 37.5–51.0)
Hemoglobin: 13.3 g/dL (ref 13.0–17.7)
MCH: 33.8 pg — ABNORMAL HIGH (ref 26.6–33.0)
MCHC: 34.4 g/dL (ref 31.5–35.7)
MCV: 98 fL — ABNORMAL HIGH (ref 79–97)
Platelets: 74 10*3/uL — CL (ref 150–450)
RBC: 3.94 x10E6/uL — ABNORMAL LOW (ref 4.14–5.80)
RDW: 13.4 % (ref 11.6–15.4)
WBC: 3.3 10*3/uL — ABNORMAL LOW (ref 3.4–10.8)

## 2019-08-15 LAB — BASIC METABOLIC PANEL
BUN/Creatinine Ratio: 16 (ref 10–24)
BUN: 17 mg/dL (ref 8–27)
CO2: 24 mmol/L (ref 20–29)
Calcium: 8.9 mg/dL (ref 8.6–10.2)
Chloride: 106 mmol/L (ref 96–106)
Creatinine, Ser: 1.07 mg/dL (ref 0.76–1.27)
GFR calc Af Amer: 82 mL/min/{1.73_m2} (ref >59)
GFR calc non Af Amer: 71 mL/min/{1.73_m2} (ref >59)
Glucose: 86 mg/dL (ref 65–99)
Potassium: 4.6 mmol/L (ref 3.5–5.2)
Sodium: 138 mmol/L (ref 134–144)

## 2019-08-15 LAB — HEPATIC FUNCTION PANEL
ALT: 39 IU/L (ref 0–44)
AST: 71 IU/L — ABNORMAL HIGH (ref 0–40)
Albumin: 3.5 g/dL — ABNORMAL LOW (ref 3.8–4.8)
Alkaline Phosphatase: 100 IU/L (ref 39–117)
Bilirubin Total: 1.8 mg/dL — ABNORMAL HIGH (ref 0.0–1.2)
Bilirubin, Direct: 0.7 mg/dL — ABNORMAL HIGH (ref 0.00–0.40)
Total Protein: 6.1 g/dL (ref 6.0–8.5)

## 2019-08-15 LAB — PROTIME-INR
INR: 1.4 — ABNORMAL HIGH (ref 0.9–1.2)
Prothrombin Time: 15.1 s — ABNORMAL HIGH (ref 9.1–12.0)

## 2019-08-15 LAB — AMMONIA: Ammonia: 62 ug/dL (ref 40–200)

## 2019-08-16 DIAGNOSIS — E119 Type 2 diabetes mellitus without complications: Secondary | ICD-10-CM | POA: Diagnosis not present

## 2019-08-20 ENCOUNTER — Encounter (INDEPENDENT_AMBULATORY_CARE_PROVIDER_SITE_OTHER): Payer: Self-pay | Admitting: *Deleted

## 2019-08-20 ENCOUNTER — Other Ambulatory Visit: Payer: Self-pay

## 2019-08-20 ENCOUNTER — Ambulatory Visit (INDEPENDENT_AMBULATORY_CARE_PROVIDER_SITE_OTHER): Payer: Medicare Other | Admitting: Internal Medicine

## 2019-08-20 ENCOUNTER — Other Ambulatory Visit (INDEPENDENT_AMBULATORY_CARE_PROVIDER_SITE_OTHER): Payer: Self-pay | Admitting: *Deleted

## 2019-08-20 ENCOUNTER — Encounter (INDEPENDENT_AMBULATORY_CARE_PROVIDER_SITE_OTHER): Payer: Self-pay | Admitting: Internal Medicine

## 2019-08-20 VITALS — BP 116/70 | HR 54 | Temp 97.1°F | Ht 67.0 in | Wt 204.8 lb

## 2019-08-20 DIAGNOSIS — K7460 Unspecified cirrhosis of liver: Secondary | ICD-10-CM | POA: Diagnosis not present

## 2019-08-20 DIAGNOSIS — K7290 Hepatic failure, unspecified without coma: Secondary | ICD-10-CM

## 2019-08-20 DIAGNOSIS — K7682 Hepatic encephalopathy: Secondary | ICD-10-CM

## 2019-08-20 MED ORDER — LACTULOSE 10 GM/15ML PO SOLN: 20.0000 g | Freq: Three times a day (TID) | ORAL | 5 refills | Status: DC

## 2019-08-20 NOTE — Progress Notes (Signed)
Presenting complaint;  Follow-up for chronic liver disease and hepatic encephalopathy.  Database and subjective:  Patient is 69 year old Caucasian male who is here for scheduled visit.  He has history of cirrhosis which was diagnosed in August 2016 when he presented with pancytopenia.  Biochemical markers were all negative.  Liver disease was felt to be secondary to NAFLD.  He had remained with well-preserved hepatic function. He presented with new onset of hepatic encephalopathy in February 2021.  Several weeks earlier he had been diagnosed with Covid and experience symptoms.  There was no evidence of triggers such as infection or portal vein thrombosis.  During hospitalization his meld score was over 17.  He was discharged on lactulose and Xifaxan. He was last seen on 06/18/2018 and was continuing to improve. He he says he is doing well.  He has lost 15 pounds since his last visit.  He had lost 7 pounds prior to that visit.  He says he is very active.  He either does 40 minutes on a structured bike or walks outside.  He generally does 20 minutes at a time.  His appetite is good.  Eats 3 meals and 1 snack at night.  His bowels move at least 2-3 times a day.  He denies abdominal pain pruritus melena or rectal bleeding.  His only complaint is that food does not taste as good since he is not using salt.  He has not experienced any episodes of confusion.  He says he has not heard from [facility]. He needs refill on lactulose. He had blood work as planned which is reviewed below.  Current Medications: Outpatient Encounter Medications as of 08/20/2019  Medication Sig  . atorvastatin (LIPITOR) 20 MG tablet Take 20 mg by mouth daily.   . fenofibrate (TRICOR) 145 MG tablet Take 145 mg by mouth daily.  Marland Kitchen lactulose (CHRONULAC) 10 GM/15ML solution Take 30 mLs (20 g total) by mouth 3 (three) times daily.  . Omega-3 Fatty Acids (OMEGA 3 500 PO) Take by mouth daily.  . rifaximin (XIFAXAN) 550 MG TABS tablet  Take 1 tablet (550 mg total) by mouth 2 (two) times daily.  Marland Kitchen zinc gluconate 50 MG tablet Take 50 mg by mouth daily.   No facility-administered encounter medications on file as of 08/20/2019.     Objective: Blood pressure 116/70, pulse (!) 54, temperature (!) 97.1 F (36.2 C), temperature source Temporal, height 5' 7"  (1.702 m), weight 204 lb 12.8 oz (92.9 kg). Patient is alert and in no acute distress. He is wearing a facial mask. He does not have asterixis. Conjunctiva is pink. Sclera is nonicteric Oropharyngeal mucosa is normal. No neck masses or thyromegaly noted. Cardiac exam with regular rhythm normal S1 and S2.  He has faint systolic murmur heard at aortic as well as pulmonary areas. Lungs are clear to auscultation. Abdomen is full but soft and nontender.  Spleen is not palpable.  Liver edge is indistinct below RCM. No LE edema or clubbing noted.  Labs/studies Results:  CBC Latest Ref Rng & Units 08/13/2019 06/11/2019 06/01/2019  WBC 3.4 - 10.8 x10E3/uL 3.3(L) 3.1(L) 3.6(L)  Hemoglobin 13.0 - 17.7 g/dL 13.3 11.7(L) 12.0(L)  Hematocrit 37.5 - 51.0 % 38.7 33.7(L) 35.8(L)  Platelets 150 - 450 x10E3/uL 74(LL) 82(L) 70(L)    CMP Latest Ref Rng & Units 08/13/2019 06/11/2019 06/01/2019  Glucose 65 - 99 mg/dL 86 - 92  BUN 8 - 27 mg/dL 17 - 16  Creatinine 0.76 - 1.27 mg/dL 1.07 -  0.95  Sodium 134 - 144 mmol/L 138 - 136  Potassium 3.5 - 5.2 mmol/L 4.6 - 3.7  Chloride 96 - 106 mmol/L 106 - 106  CO2 20 - 29 mmol/L 24 - 22  Calcium 8.6 - 10.2 mg/dL 8.9 - 8.7(L)  Total Protein 6.0 - 8.5 g/dL 6.1 5.5(L) 5.5(L)  Total Bilirubin 0.0 - 1.2 mg/dL 1.8(H) 1.8(H) 2.8(H)  Alkaline Phos 39 - 117 IU/L 100 - 82  AST 0 - 40 IU/L 71(H) 52(H) 49(H)  ALT 0 - 44 IU/L 39 33 29    Hepatic Function Latest Ref Rng & Units 08/13/2019 06/11/2019 06/01/2019  Total Protein 6.0 - 8.5 g/dL 6.1 5.5(L) 5.5(L)  Albumin 3.8 - 4.8 g/dL 3.5(L) - 2.9(L)  AST 0 - 40 IU/L 71(H) 52(H) 49(H)  ALT 0 - 44 IU/L 39 33 29  Alk  Phosphatase 39 - 117 IU/L 100 - 82  Total Bilirubin 0.0 - 1.2 mg/dL 1.8(H) 1.8(H) 2.8(H)  Bilirubin, Direct 0.00 - 0.40 mg/dL 0.70(H) 0.7(H) -    Serum ammonia 62 (40-200 uG/dL)  INR 1.4.  Assessment:  #1.  Hepatic encephalopathy.  This was first diagnosed in February 2021 when he was hospitalized.  Cirrhosis was diagnosed almost 5 years ago although he must have had it for many more years.  Hepatic decompensation may have been the result of Covid infection that he had few weeks earlier.  He appears to be back at his baseline.  Serum ammonia is normal.  #2.  Cirrhosis felt to be secondary to NAFLD.  Hepatic function has improved as evidenced by drop in his INR.  His meld score has decreased to 13 compared to it was 17.4 when he was hospitalized. He needs to be screened for esophageal varices since his liver disease has progressed.  He has received hepatitis A and B vaccination.  He is up-to-date on screening for [place].  #3.  Thrombocytopenia and leukopenia secondary to cirrhosis/splenomegaly.  It is stable.   Plan:  New prescription given for lactulose for 1 month with 5 refills. Esophagogastroduodenoscopy to screen and man esophageal varices under monitored anesthesia care. We will contact [facility] and inquire when he would be seen. Patient may use ingredients other than salt to make his foods more palatable. Office visit in 6 months.

## 2019-08-20 NOTE — Patient Instructions (Signed)
Esophagogastroduodenoscopy to screen and band esophageal varices to be scheduled.

## 2019-08-22 ENCOUNTER — Other Ambulatory Visit: Payer: Self-pay

## 2019-08-22 ENCOUNTER — Ambulatory Visit (HOSPITAL_COMMUNITY)
Admission: RE | Admit: 2019-08-22 | Discharge: 2019-08-22 | Disposition: A | Discharge status: Home / Self Care | Payer: Medicare Other | Source: Ambulatory Visit | Attending: Family Medicine | Admitting: Family Medicine

## 2019-08-22 DIAGNOSIS — I6523 Occlusion and stenosis of bilateral carotid arteries: Secondary | ICD-10-CM | POA: Diagnosis not present

## 2019-08-22 DIAGNOSIS — R0989 Other specified symptoms and signs involving the circulatory and respiratory systems: Secondary | ICD-10-CM | POA: Insufficient documentation

## 2019-08-28 NOTE — Patient Instructions (Signed)
Cody Austin  08/28/2019     @PREFPERIOPPHARMACY @   Your procedure is scheduled on  08/31/2019 .  Report to [facility] at  1030  A.M.  Call this number if you have problems the morning of surgery:  [phone]   Remember:  Follow the diet instructions given to you by Dr Olevia Perches office.                       Take these medicines the morning of surgery with A SIP OF WATER  Rifaximin.    Do not wear jewelry, make-up or nail polish.  Do not wear lotions, powders, or perfumes Please wear deodorant and brush your teeth..  Do not shave 48 hours prior to surgery.  Men may shave face and neck.  Do not bring valuables to the hospital.  [facility] is not responsible for any belongings or valuables.  Contacts, dentures or bridgework may not be worn into surgery.  Leave your suitcase in the car.  After surgery it may be brought to your room.  For patients admitted to the hospital, discharge time will be determined by your treatment team.  Patients discharged the day of surgery will not be allowed to drive home.   Name and phone number of your driver:   Family. Special instructions:  DO NOT smoke the morning of your procedure.  Please read over the following fact sheets that you were given. Anesthesia Post-op Instructions and Care and Recovery After Surgery       Upper Endoscopy, Adult, Care After This sheet gives you information about how to care for yourself after your procedure. Your health care provider may also give you more specific instructions. If you have problems or questions, contact your health care provider. What can I expect after the procedure? After the procedure, it is common to have:  A sore throat.  Mild stomach pain or discomfort.  Bloating.  Nausea. Follow these instructions at home:   Follow instructions from your health care provider about what to eat or drink after your procedure.  Return to your normal activities as told by your health  care provider. Ask your health care provider what activities are safe for you.  Take over-the-counter and prescription medicines only as told by your health care provider.  Do not drive for 24 hours if you were given a sedative during your procedure.  Keep all follow-up visits as told by your health care provider. This is important. Contact a health care provider if you have:  A sore throat that lasts longer than one day.  Trouble swallowing. Get help right away if:  You vomit blood or your vomit looks like coffee grounds.  You have: ? A fever. ? Bloody, black, or tarry stools. ? A severe sore throat or you cannot swallow. ? Difficulty breathing. ? Severe pain in your chest or abdomen. Summary  After the procedure, it is common to have a sore throat, mild stomach discomfort, bloating, and nausea.  Do not drive for 24 hours if you were given a sedative during the procedure.  Follow instructions from your health care provider about what to eat or drink after your procedure.  Return to your normal activities as told by your health care provider. This information is not intended to replace advice given to you by your health care provider. Make sure you discuss any questions you have with your health care provider. Document Revised: 09/27/2017 Document  Reviewed: 09/05/2017 Elsevier Patient Education  [place] After These instructions provide you with information about caring for yourself after your procedure. Your health care provider may also give you more specific instructions. Your treatment has been planned according to current medical practices, but problems sometimes occur. Call your health care provider if you have any problems or questions after your procedure. What can I expect after the procedure? After your procedure, you may:  Feel sleepy for several hours.  Feel clumsy and have poor balance for several hours.  Feel  forgetful about what happened after the procedure.  Have poor judgment for several hours.  Feel nauseous or vomit.  Have a sore throat if you had a breathing tube during the procedure. Follow these instructions at home: For at least 24 hours after the procedure:      Have a responsible adult stay with you. It is important to have someone help care for you until you are awake and alert.  Rest as needed.  Do not: ? Participate in activities in which you could fall or become injured. ? Drive. ? Use heavy machinery. ? Drink alcohol. ? Take sleeping pills or medicines that cause drowsiness. ? Make important decisions or sign legal documents. ? Take care of children on your own. Eating and drinking  Follow the diet that is recommended by your health care provider.  If you vomit, drink water, juice, or soup when you can drink without vomiting.  Make sure you have little or no nausea before eating solid foods. General instructions  Take over-the-counter and prescription medicines only as told by your health care provider.  If you have sleep apnea, surgery and certain medicines can increase your risk for breathing problems. Follow instructions from your health care provider about wearing your sleep device: ? Anytime you are sleeping, including during daytime naps. ? While taking prescription pain medicines, sleeping medicines, or medicines that make you drowsy.  If you smoke, do not smoke without supervision.  Keep all follow-up visits as told by your health care provider. This is important. Contact a health care provider if:  You keep feeling nauseous or you keep vomiting.  You feel light-headed.  You develop a rash.  You have a fever. Get help right away if:  You have trouble breathing. Summary  For several hours after your procedure, you may feel sleepy and have poor judgment.  Have a responsible adult stay with you for at least 24 hours or until you are awake and  alert. This information is not intended to replace advice given to you by your health care provider. Make sure you discuss any questions you have with your health care provider. Document Revised: 07/04/2017 Document Reviewed: 07/27/2015 Elsevier Patient Education  [place].

## 2019-08-29 ENCOUNTER — Other Ambulatory Visit: Payer: Self-pay

## 2019-08-29 ENCOUNTER — Encounter (HOSPITAL_COMMUNITY)
Admission: RE | Admit: 2019-08-29 | Discharge: 2019-08-29 | Disposition: A | Discharge status: Home / Self Care | Payer: Medicare Other | Source: Ambulatory Visit | Attending: Internal Medicine | Admitting: Internal Medicine

## 2019-08-29 ENCOUNTER — Other Ambulatory Visit (HOSPITAL_COMMUNITY)
Admission: RE | Admit: 2019-08-29 | Discharge: 2019-08-29 | Disposition: A | Discharge status: Home / Self Care | Payer: Medicare Other | Source: Ambulatory Visit | Attending: Internal Medicine | Admitting: Internal Medicine

## 2019-08-29 ENCOUNTER — Encounter (HOSPITAL_COMMUNITY): Payer: Self-pay

## 2019-08-29 DIAGNOSIS — K7460 Unspecified cirrhosis of liver: Secondary | ICD-10-CM | POA: Diagnosis not present

## 2019-08-29 DIAGNOSIS — K7581 Nonalcoholic steatohepatitis (NASH): Secondary | ICD-10-CM | POA: Diagnosis not present

## 2019-08-29 DIAGNOSIS — G4730 Sleep apnea, unspecified: Secondary | ICD-10-CM | POA: Diagnosis not present

## 2019-08-29 DIAGNOSIS — Z20822 Contact with and (suspected) exposure to covid-19: Secondary | ICD-10-CM | POA: Diagnosis not present

## 2019-08-29 DIAGNOSIS — Z01812 Encounter for preprocedural laboratory examination: Secondary | ICD-10-CM | POA: Diagnosis not present

## 2019-08-29 DIAGNOSIS — Z79899 Other long term (current) drug therapy: Secondary | ICD-10-CM | POA: Diagnosis not present

## 2019-08-29 HISTORY — DX: Personal history of urinary calculi: Z87.442

## 2019-08-29 LAB — CBC WITH DIFFERENTIAL/PLATELET
Abs Immature Granulocytes: 0 10*3/uL (ref 0.00–0.07)
Basophils Absolute: 0 10*3/uL (ref 0.0–0.1)
Basophils Relative: 1 %
Eosinophils Absolute: 0.2 10*3/uL (ref 0.0–0.5)
Eosinophils Relative: 7 %
HCT: 36.5 % — ABNORMAL LOW (ref 39.0–52.0)
Hemoglobin: 12.2 g/dL — ABNORMAL LOW (ref 13.0–17.0)
Immature Granulocytes: 0 %
Lymphocytes Relative: 26 %
Lymphs Abs: 0.7 10*3/uL (ref 0.7–4.0)
MCH: 33.8 pg (ref 26.0–34.0)
MCHC: 33.4 g/dL (ref 30.0–36.0)
MCV: 101.1 fL — ABNORMAL HIGH (ref 80.0–100.0)
Monocytes Absolute: 0.4 10*3/uL (ref 0.1–1.0)
Monocytes Relative: 15 %
Neutro Abs: 1.4 10*3/uL — ABNORMAL LOW (ref 1.7–7.7)
Neutrophils Relative %: 51 %
Platelets: 67 10*3/uL — ABNORMAL LOW (ref 150–400)
RBC: 3.61 MIL/uL — ABNORMAL LOW (ref 4.22–5.81)
RDW: 15.5 % (ref 11.5–15.5)
WBC: 2.6 10*3/uL — ABNORMAL LOW (ref 4.0–10.5)
nRBC: 0 % (ref 0.0–0.2)

## 2019-08-29 LAB — COMPREHENSIVE METABOLIC PANEL
ALT: 32 U/L (ref 0–44)
AST: 59 U/L — ABNORMAL HIGH (ref 15–41)
Albumin: 3.2 g/dL — ABNORMAL LOW (ref 3.5–5.0)
Alkaline Phosphatase: 82 U/L (ref 38–126)
Anion gap: 7 (ref 5–15)
BUN: 19 mg/dL (ref 8–23)
CO2: 25 mmol/L (ref 22–32)
Calcium: 9.1 mg/dL (ref 8.9–10.3)
Chloride: 107 mmol/L (ref 98–111)
Creatinine, Ser: 0.88 mg/dL (ref 0.61–1.24)
GFR calc Af Amer: >60 mL/min (ref >60)
GFR calc non Af Amer: >60 mL/min (ref >60)
Glucose, Bld: 109 mg/dL — ABNORMAL HIGH (ref 70–99)
Potassium: 4.1 mmol/L (ref 3.5–5.1)
Sodium: 139 mmol/L (ref 135–145)
Total Bilirubin: 2.2 mg/dL — ABNORMAL HIGH (ref 0.3–1.2)
Total Protein: 6 g/dL — ABNORMAL LOW (ref 6.5–8.1)

## 2019-08-29 LAB — PROTIME-INR
INR: 1.5 — ABNORMAL HIGH (ref 0.8–1.2)
Prothrombin Time: 17.9 seconds — ABNORMAL HIGH (ref 11.4–15.2)

## 2019-08-30 LAB — SARS CORONAVIRUS 2 (TAT 6-24 HRS): SARS Coronavirus 2: NEGATIVE

## 2019-08-30 NOTE — Pre-Procedure Instructions (Signed)
Labs routed to Dr Laural Golden.

## 2019-08-31 ENCOUNTER — Ambulatory Visit (HOSPITAL_COMMUNITY): Payer: Medicare Other | Admitting: Anesthesiology

## 2019-08-31 ENCOUNTER — Encounter (HOSPITAL_COMMUNITY): Payer: Self-pay | Admitting: Internal Medicine

## 2019-08-31 ENCOUNTER — Encounter (HOSPITAL_COMMUNITY)
Admission: RE | Disposition: A | Discharge status: Home / Self Care | Payer: Self-pay | Source: Home / Self Care | Attending: Internal Medicine

## 2019-08-31 ENCOUNTER — Ambulatory Visit (HOSPITAL_COMMUNITY)
Admission: RE | Admit: 2019-08-31 | Discharge: 2019-08-31 | Disposition: A | Discharge status: Home / Self Care | Payer: Medicare Other | Attending: Internal Medicine | Admitting: Internal Medicine

## 2019-08-31 DIAGNOSIS — I739 Peripheral vascular disease, unspecified: Secondary | ICD-10-CM | POA: Insufficient documentation

## 2019-08-31 DIAGNOSIS — Z79899 Other long term (current) drug therapy: Secondary | ICD-10-CM | POA: Insufficient documentation

## 2019-08-31 DIAGNOSIS — K3189 Other diseases of stomach and duodenum: Secondary | ICD-10-CM | POA: Diagnosis not present

## 2019-08-31 DIAGNOSIS — D649 Anemia, unspecified: Secondary | ICD-10-CM | POA: Diagnosis not present

## 2019-08-31 DIAGNOSIS — K7581 Nonalcoholic steatohepatitis (NASH): Secondary | ICD-10-CM | POA: Insufficient documentation

## 2019-08-31 DIAGNOSIS — K766 Portal hypertension: Secondary | ICD-10-CM | POA: Diagnosis not present

## 2019-08-31 DIAGNOSIS — Z8616 Personal history of COVID-19: Secondary | ICD-10-CM | POA: Diagnosis not present

## 2019-08-31 DIAGNOSIS — K7290 Hepatic failure, unspecified without coma: Secondary | ICD-10-CM | POA: Insufficient documentation

## 2019-08-31 DIAGNOSIS — K7460 Unspecified cirrhosis of liver: Secondary | ICD-10-CM | POA: Diagnosis not present

## 2019-08-31 DIAGNOSIS — G4730 Sleep apnea, unspecified: Secondary | ICD-10-CM | POA: Diagnosis not present

## 2019-08-31 DIAGNOSIS — Z13810 Encounter for screening for upper gastrointestinal disorder: Secondary | ICD-10-CM | POA: Diagnosis not present

## 2019-08-31 HISTORY — PX: ESOPHAGOGASTRODUODENOSCOPY (EGD) WITH PROPOFOL: SHX5813

## 2019-08-31 HISTORY — PX: ESOPHAGEAL BANDING: SHX5518

## 2019-08-31 SURGERY — ESOPHAGOGASTRODUODENOSCOPY (EGD) WITH PROPOFOL
Anesthesia: General

## 2019-08-31 MED ORDER — LACTATED RINGERS IV SOLN: Freq: Once | INTRAVENOUS | Status: AC

## 2019-08-31 MED ORDER — LACTATED RINGERS IV SOLN: INTRAVENOUS | Status: DC | PRN

## 2019-08-31 MED ORDER — GLYCOPYRROLATE 0.2 MG/ML IJ SOLN
0.2000 mg | Freq: Once | INTRAMUSCULAR | Status: AC
  Administered 2019-08-31: 0.2 mg via INTRAVENOUS

## 2019-08-31 MED ORDER — LIDOCAINE VISCOUS HCL 2 % MT SOLN
15.0000 mL | Freq: Once | OROMUCOSAL | Status: AC
  Administered 2019-08-31: 15 mL via OROMUCOSAL

## 2019-08-31 MED ORDER — LIDOCAINE VISCOUS HCL 2 % MT SOLN
OROMUCOSAL | Status: AC
  Filled 2019-08-31: qty 15

## 2019-08-31 MED ORDER — CHLORHEXIDINE GLUCONATE CLOTH 2 % EX PADS: 6.0000 | MEDICATED_PAD | Freq: Once | CUTANEOUS | Status: DC

## 2019-08-31 MED ORDER — STERILE WATER FOR IRRIGATION IR SOLN
Status: DC | PRN
  Administered 2019-08-31: 2.5 mL

## 2019-08-31 MED ORDER — PROPOFOL 10 MG/ML IV BOLUS
INTRAVENOUS | Status: DC | PRN
  Administered 2019-08-31: 60 mg via INTRAVENOUS

## 2019-08-31 MED ORDER — GLYCOPYRROLATE 0.2 MG/ML IJ SOLN
INTRAMUSCULAR | Status: AC
  Filled 2019-08-31: qty 1

## 2019-08-31 MED ORDER — PROPOFOL 500 MG/50ML IV EMUL
INTRAVENOUS | Status: DC | PRN
  Administered 2019-08-31: 150 ug/kg/min via INTRAVENOUS

## 2019-08-31 NOTE — Op Note (Addendum)
[facility] Patient Name: Cody Austin Procedure Date: 08/31/2019 10:50 AM MRN: [id number] Date of Birth: 08/30/1950 Attending MD: Hildred Laser , MD CSN: [id number] Age: 69 Admit Type: Outpatient Procedure:                Upper GI endoscopy Indications:              Screening procedure, Cirrhosis rule out esophageal                            varices Providers:                Hildred Laser, MD, Lurline Del, RN, Nelma Rothman,                            Technician Referring MD:             Halford Chessman MD, MD Medicines:                Propofol per Anesthesia Complications:            No immediate complications. Estimated Blood Loss:     Estimated blood loss: none. Procedure:                Pre-Anesthesia Assessment:                           - Prior to the procedure, a History and Physical                            was performed, and patient medications and                            allergies were reviewed. The patient's tolerance of                            previous anesthesia was also reviewed. The risks                            and benefits of the procedure and the sedation                            options and risks were discussed with the patient.                            All questions were answered, and informed consent                            was obtained. Prior Anticoagulants: The patient has                            taken no previous anticoagulant or antiplatelet                            agents except for aspirin. ASA Grade Assessment:  III - A patient with severe systemic disease. After                            reviewing the risks and benefits, the patient was                            deemed in satisfactory condition to undergo the                            procedure.                           After obtaining informed consent, the endoscope was                            passed under direct vision. Throughout the                  procedure, the patient's blood pressure, pulse, and                            oxygen saturations were monitored continuously. The                            [id number]-[id number] ([id number]) scope was introduced through the                            mouth, and advanced to the second part of duodenum.                            The upper GI endoscopy was accomplished without                            difficulty. The patient tolerated the procedure                            well. Scope In: 10:56:48 AM Scope Out: 11:03:10 AM Total Procedure Duration: 0 hours 6 minutes 22 seconds  Findings:      The hypopharynx was normal.      The examined esophagus was normal.      The Z-line was regular and was found 43 cm from the incisors.      Mild portal hypertensive gastropathy was found in the gastric fundus and       in the gastric body.      The cardia, gastric antrum and pylorus were normal.      Patchy mildly congested erythematous mucosa without active bleeding and       with no stigmata of bleeding was found in the duodenal bulb. Impression:               - Normal hypopharynx.                           - Normal esophagus.                           - Z-line regular, 43 cm from the incisors.                           -  Portal hypertensive gastropathy.                           - Normal cardia, antrum and pylorus.                           - Congested duodenal mucosa.                           - No specimens collected.                           comment; no esophageal or gastric varices Moderate Sedation:      Per Anesthesia Care Recommendation:           - Patient has a contact number available for                            emergencies. The signs and symptoms of potential                            delayed complications were discussed with the                            patient. Return to normal activities tomorrow.                            Written discharge instructions were provided  to the                            patient.                           - Resume previous diet today.                           - Continue present medications.                           - Repeat upper endoscopy in 1 year. Procedure Code(s):        --- Professional ---                           [phone], Esophagogastroduodenoscopy, flexible,                            transoral; diagnostic, including collection of                            specimen(s) by brushing or washing, when performed                            (separate procedure) Diagnosis Code(s):        --- Professional ---                           K76.6, Portal hypertension  K31.89, Other diseases of stomach and duodenum                           Z13.810, Encounter for screening for upper                            gastrointestinal disorder                           K74.60, Unspecified cirrhosis of liver CPT copyright 2019 American Medical Association. All rights reserved. The codes documented in this report are preliminary and upon coder review may  be revised to meet current compliance requirements. Hildred Laser, MD Hildred Laser, MD 08/31/2019 11:11:49 AM This report has been signed electronically. Number of Addenda: 0

## 2019-08-31 NOTE — Transfer of Care (Signed)
Immediate Anesthesia Transfer of Care Note  Patient: Cody Austin  Procedure(s) Performed: ESOPHAGOGASTRODUODENOSCOPY (EGD) WITH PROPOFOL (N/A ) ESOPHAGEAL BANDING (N/A )  Patient Location: PACU  Anesthesia Type:General  Level of Consciousness: awake, alert , oriented and patient cooperative  Airway & Oxygen Therapy: Patient Spontanous Breathing  Post-op Assessment: Report given to RN, Post -op Vital signs reviewed and stable and Patient moving all extremities X 4  Post vital signs: Reviewed and stable  Last Vitals:  Vitals Value Taken Time  BP    Temp    Pulse 72 08/31/19 1110  Resp    SpO2 99 % 08/31/19 1110  Vitals shown include unvalidated device data.  Last Pain:  Vitals:   08/31/19 1008  TempSrc: Oral  PainSc: 0-No pain         Complications: No apparent anesthesia complications

## 2019-08-31 NOTE — Anesthesia Preprocedure Evaluation (Signed)
Anesthesia Evaluation  Patient identified by MRN, date of birth, ID band Patient awake    Reviewed: Allergy & Precautions, NPO status , Patient's Chart, lab work & pertinent test results  History of Anesthesia Complications Negative for: history of anesthetic complications  Airway Mallampati: III  TM Distance: >3 FB Neck ROM: Full    Dental  (+) Teeth Intact, Dental Advisory Given   Pulmonary sleep apnea and Continuous Positive Airway Pressure Ventilation , pneumonia (covid positive - 01/2019), resolved,    Pulmonary exam normal breath sounds clear to auscultation       Cardiovascular Exercise Tolerance: Good + Peripheral Vascular Disease  Normal cardiovascular exam+ Valvular Problems/Murmurs  Rhythm:Regular Rate:Normal + Systolic murmurs [id number] 16:45:40 [facility] ROUTINE RECORD Sinus or ectopic atrial tachycardia Prolonged PR interval Borderline T wave abnormalities   Neuro/Psych Hepatic encephalopathy     GI/Hepatic (+) Cirrhosis  (nonalcoholic)  Esophageal Varices    , Hepatitis -  Endo/Other  negative endocrine ROS  Renal/GU Renal disease (stones)     Musculoskeletal  (+) Arthritis ,   Abdominal   Peds  Hematology  (+) Blood dyscrasia (thrombocytopenia - 67000), anemia ,   Anesthesia Other Findings   Reproductive/Obstetrics                             Anesthesia Physical Anesthesia Plan  ASA: III  Anesthesia Plan: General   Post-op Pain Management:    Induction: Intravenous  PONV Risk Score and Plan: 1 and TIVA  Airway Management Planned: Nasal Cannula and Natural Airway  Additional Equipment:   Intra-op Plan:   Post-operative Plan: Possible Post-op intubation/ventilation  Informed Consent: I have reviewed the patients History and Physical, chart, labs and discussed the procedure including the risks, benefits and alternatives for the  proposed anesthesia with the patient or authorized representative who has indicated his/her understanding and acceptance.     Dental advisory given  Plan Discussed with: CRNA and Surgeon  Anesthesia Plan Comments:         Anesthesia Quick Evaluation

## 2019-08-31 NOTE — Anesthesia Postprocedure Evaluation (Signed)
Anesthesia Post Note  Patient: Cody Austin  Procedure(s) Performed: ESOPHAGOGASTRODUODENOSCOPY (EGD) WITH PROPOFOL (N/A ) ESOPHAGEAL BANDING (N/A )  Patient location during evaluation: PACU Anesthesia Type: General Level of consciousness: awake, oriented, awake and alert and patient cooperative Pain management: pain level controlled Vital Signs Assessment: post-procedure vital signs reviewed and stable Respiratory status: spontaneous breathing, respiratory function stable and nonlabored ventilation Cardiovascular status: stable Postop Assessment: no apparent nausea or vomiting Anesthetic complications: no     Last Vitals:  Vitals:   08/31/19 1008  BP: (!) 135/57  Pulse: (!) 53  Resp: (!) 21  Temp: 36.9 C  SpO2: 99%    Last Pain:  Vitals:   08/31/19 1008  TempSrc: Oral  PainSc: 0-No pain                 Shanequia Kendrick

## 2019-08-31 NOTE — H&P (Signed)
Cody Austin is an 69 y.o. male.   Chief Complaint: Patient is here for esophagogastroduodenoscopy for esophageal variceal screening/banding HPI: Patient is 69 year old Caucasian male who was diagnosed with cirrhosis about 5 years ago.  Work-up suggested cirrhosis secondary to NASH.  He has remained with preserved hepatic function until about 3 months ago when he developed new onset of hepatic encephalopathy.  Few months earlier he been treated for Covid.  Hepatic encephalopathy is well controlled with medication.  He is here for EGD to screen for esophageal varices.  If these varices are of significant size these would be banded. Patient denies heartburn dysphagia abdominal pain melena or rectal bleeding.  Past Medical History:  Diagnosis Date  . Arthritis   . Cirrhosis, non-alcoholic ([place])   . Fatty liver   . Heart murmur   . History of kidney stones   . NASH Liver cirrhosis secondary to NASH (nonalcoholic steatohepatitis) 06/01/2019  . Other pancytopenia ([place]) 11/19/2014  . Peripheral arterial disease ([place]) 05/28/2019  . Sleep apnea    uses CPAP    Past Surgical History:  Procedure Laterality Date  . CATARACT EXTRACTION W/PHACO Right 08/12/2014   Procedure: CATARACT EXTRACTION PHACO AND INTRAOCULAR LENS PLACEMENT (IOC);  Surgeon: [name], MD;  Location: [facility];  Service: Ophthalmology;  Laterality: Right;  CDE 9.32  . CATARACT EXTRACTION W/PHACO Left 08/26/2014   Procedure: CATARACT EXTRACTION PHACO AND INTRAOCULAR LENS PLACEMENT (IOC);  Surgeon: [name], MD;  Location: [facility] ORS;  Service: Ophthalmology;  Laterality: Left;  CDE:  9.49  . COLONOSCOPY N/A 12/13/2014   Procedure: COLONOSCOPY;  Surgeon: Rogene Houston, MD;  Location: [facility] END[facility] SUITE;  Service: Endoscopy;  Laterality: N/A;  100  . ESOPHAGOGASTRODUODENOSCOPY N/A 12/13/2014   Procedure: ESOPHAGOGASTRODUODENOSCOPY (EGD);  Surgeon: Rogene Houston, MD;  Location: [facility] SU[facility];  Service: Endoscopy;  Laterality: N/A;  . HERNIA  REPAIR Right    Inguinal hernia  . TONSILLECTOMY      Family History  Problem Relation Age of Onset  . Cancer Father   . Aneurysm Father    Social History:  reports that he has never smoked. He has never used smokeless tobacco. He reports current alcohol use. He reports that he does not use drugs.  Allergies: No Known Allergies  Medications Prior to Admission  Medication Sig Dispense Refill  . atorvastatin (LIPITOR) 20 MG tablet Take 20 mg by mouth at bedtime.   0  . fenofibrate (TRICOR) 145 MG tablet Take 145 mg by mouth at bedtime.     Marland Kitchen lactulose (CHRONULAC) 10 GM/15ML solution Take 30 mLs (20 g total) by mouth 3 (three) times daily. (Patient taking differently: Take 20 g by mouth 3 (three) times daily. 30 ml) 2700 mL 5  . Omega-3 Fatty Acids (OMEGA 3 500 PO) Take 500 mg by mouth daily.     . rifaximin (XIFAXAN) 550 MG TABS tablet Take 1 tablet (550 mg total) by mouth 2 (two) times daily. 60 tablet 2  . tolnaftate (TINACTIN) 1 % cream Apply 1 application topically 3 (three) times a week. On leg for ring worm    . zinc gluconate 50 MG tablet Take 50 mg by mouth at bedtime.       Results for orders placed or performed during the hospital encounter of 08/29/19 (from the past 48 hour(s))  SARS CORONAVIRUS 2 (TAT 6-24 HRS) Nasopharyngeal Nasopharyngeal Swab     Status: None   Collection Time: 08/29/19  1:52 PM   Specimen: Nasopharyngeal  Swab  Result Value Ref Range   SARS Coronavirus 2 NEGATIVE NEGATIVE    Comment: (NOTE) SARS-CoV-2 target nucleic acids are NOT DETECTED. The SARS-CoV-2 RNA is generally detectable in upper and lower respiratory specimens during the acute phase of infection. Negative results do not preclude SARS-CoV-2 infection, do not rule out co-infections with other pathogens, and should not be used as the sole basis for treatment or other patient management decisions. Negative results must be combined with clinical observations, patient history, and  epidemiological information. The expected result is Negative. Fact Sheet for Patients: SugarRoll.be Fact Sheet for Healthcare Providers: https://www.woods-mathews.com/ This test is not yet approved or cleared by the [place] FDA and  has been authorized for detection and/or diagnosis of SARS-CoV-2 by FDA under an Emergency Use Authorization (EUA). This EUA will remain  in effect (meaning this test can be used) for the duration of the COVID-19 declaration under Section 56 4(b)(1) of the Act, 21 U.S.C. section 360bbb-3(b)(1), unless the authorization is terminated or revoked sooner. Performed at [facility], [place] [address] [place], [place] [address]   CBC with Differential/Platelet     Status: Abnormal   Collection Time: 08/29/19  3:00 PM  Result Value Ref Range   WBC 2.6 (L) 4.0 - 10.5 K/uL   RBC 3.61 (L) 4.22 - 5.81 MIL/uL   Hemoglobin 12.2 (L) 13.0 - 17.0 g/dL   HCT 36.5 (L) 39.0 - 52.0 %   MCV 101.1 (H) 80.0 - 100.0 fL   MCH 33.8 26.0 - 34.0 pg   MCHC 33.4 30.0 - 36.0 g/dL   RDW 15.5 11.5 - 15.5 %   Platelets 67 (L) 150 - 400 K/uL    Comment: Immature Platelet Fraction may be clinically indicated, consider ordering this additional test [id number]    nRBC 0.0 0.0 - 0.2 %   Neutrophils Relative % 51 %   Neutro Abs 1.4 (L) 1.7 - 7.7 K/uL   Lymphocytes Relative 26 %   Lymphs Abs 0.7 0.7 - 4.0 K/uL   Monocytes Relative 15 %   Monocytes Absolute 0.4 0.1 - 1.0 K/uL   Eosinophils Relative 7 %   Eosinophils Absolute 0.2 0.0 - 0.5 K/uL   Basophils Relative 1 %   Basophils Absolute 0.0 0.0 - 0.1 K/uL   Immature Granulocytes 0 %   Abs Immature Granulocytes 0.00 0.00 - 0.07 K/uL    Comment: Performed at [facility], [address], [place], [place] [address]  Comprehensive metabolic panel     Status: Abnormal   Collection Time: 08/29/19  3:00 PM  Result Value Ref Range   Sodium 139 135 - 145 mmol/L   Potassium 4.1 3.5 - 5.1  mmol/L   Chloride 107 98 - 111 mmol/L   CO2 25 22 - 32 mmol/L   Glucose, Bld 109 (H) 70 - 99 mg/dL    Comment: Glucose reference range applies only to samples taken after fasting for at least 8 hours.   BUN 19 8 - 23 mg/dL   Creatinine, Ser 0.88 0.61 - 1.24 mg/dL   Calcium 9.1 8.9 - 10.3 mg/dL   Total Protein 6.0 (L) 6.5 - 8.1 g/dL   Albumin 3.2 (L) 3.5 - 5.0 g/dL   AST 59 (H) 15 - 41 U/L   ALT 32 0 - 44 U/L   Alkaline Phosphatase 82 38 - 126 U/L   Total Bilirubin 2.2 (H) 0.3 - 1.2 mg/dL   GFR calc non Af Amer >60 >60 mL/min  GFR calc Af Amer >60 >60 mL/min   Anion gap 7 5 - 15    Comment: Performed at [facility], [address] [place], [place] [address]  Protime-INR     Status: Abnormal   Collection Time: 08/29/19  3:00 PM  Result Value Ref Range   Prothrombin Time 17.9 (H) 11.4 - 15.2 seconds   INR 1.5 (H) 0.8 - 1.2    Comment: (NOTE) INR goal varies based on device and disease states. Performed at [facility], [address] [place], [place] [address]    Lab studies reviewed with patient. Review of Systems  Blood pressure (!) 135/57, pulse (!) 53, temperature 98.5 F (36.9 C), temperature source Oral, resp. rate (!) 21, height 5' 7"  (1.702 m), weight 91.2 kg, SpO2 99 %. Physical Exam   Assessment/Plan Cirrhosis secondary to NASH complicated by hepatic encephalopathy. Esophagogastroduodenoscopy with possible esophageal variceal banding under monitored anesthesia care.  Hildred Laser, MD 08/31/2019, 10:47 AM

## 2019-08-31 NOTE — Discharge Instructions (Signed)
Resume usual medications and diet as before. No driving for 24 hours. Office visit as planned.   Monitored Anesthesia Care, Care After These instructions provide you with information about caring for yourself after your procedure. Your health care provider may also give you more specific instructions. Your treatment has been planned according to current medical practices, but problems sometimes occur. Call your health care provider if you have any problems or questions after your procedure. What can I expect after the procedure? After your procedure, you may:  Feel sleepy for several hours.  Feel clumsy and have poor balance for several hours.  Feel forgetful about what happened after the procedure.  Have poor judgment for several hours.  Feel nauseous or vomit.  Have a sore throat if you had a breathing tube during the procedure. Follow these instructions at home: For at least 24 hours after the procedure:      Have a responsible adult stay with you. It is important to have someone help care for you until you are awake and alert.  Rest as needed.  Do not: ? Participate in activities in which you could fall or become injured. ? Drive. ? Use heavy machinery. ? Drink alcohol. ? Take sleeping pills or medicines that cause drowsiness. ? Make important decisions or sign legal documents. ? Take care of children on your own. Eating and drinking  Follow the diet that is recommended by your health care provider.  If you vomit, drink water, juice, or soup when you can drink without vomiting.  Make sure you have little or no nausea before eating solid foods. General instructions  Take over-the-counter and prescription medicines only as told by your health care provider.  If you have sleep apnea, surgery and certain medicines can increase your risk for breathing problems. Follow instructions from your health care provider about wearing your sleep device: ? Anytime you are  sleeping, including during daytime naps. ? While taking prescription pain medicines, sleeping medicines, or medicines that make you drowsy.  If you smoke, do not smoke without supervision.  Keep all follow-up visits as told by your health care provider. This is important. Contact a health care provider if:  You keep feeling nauseous or you keep vomiting.  You feel light-headed.  You develop a rash.  You have a fever. Get help right away if:  You have trouble breathing. Summary  For several hours after your procedure, you may feel sleepy and have poor judgment.  Have a responsible adult stay with you for at least 24 hours or until you are awake and alert. This information is not intended to replace advice given to you by your health care provider. Make sure you discuss any questions you have with your health care provider. Document Revised: 07/04/2017 Document Reviewed: 07/27/2015 Elsevier Patient Education  [place].  Upper Endoscopy, Adult, Care After This sheet gives you information about how to care for yourself after your procedure. Your health care provider may also give you more specific instructions. If you have problems or questions, contact your health care provider. What can I expect after the procedure? After the procedure, it is common to have:  A sore throat.  Mild stomach pain or discomfort.  Bloating.  Nausea. Follow these instructions at home:   Follow instructions from your health care provider about what to eat or drink after your procedure.  Return to your normal activities as told by your health care provider. Ask your health care provider what activities  are safe for you.  Take over-the-counter and prescription medicines only as told by your health care provider.  Do not drive for 24 hours if you were given a sedative during your procedure.  Keep all follow-up visits as told by your health care provider. This is important. Contact a  health care provider if you have:  A sore throat that lasts longer than one day.  Trouble swallowing. Get help right away if:  You vomit blood or your vomit looks like coffee grounds.  You have: ? A fever. ? Bloody, black, or tarry stools. ? A severe sore throat or you cannot swallow. ? Difficulty breathing. ? Severe pain in your chest or abdomen. Summary  After the procedure, it is common to have a sore throat, mild stomach discomfort, bloating, and nausea.  Do not drive for 24 hours if you were given a sedative during the procedure.  Follow instructions from your health care provider about what to eat or drink after your procedure.  Return to your normal activities as told by your health care provider. This information is not intended to replace advice given to you by your health care provider. Make sure you discuss any questions you have with your health care provider. Document Revised: 09/27/2017 Document Reviewed: 09/05/2017 Elsevier Patient Education  [place].

## 2019-11-20 ENCOUNTER — Ambulatory Visit (INDEPENDENT_AMBULATORY_CARE_PROVIDER_SITE_OTHER): Payer: Medicare Other | Admitting: Internal Medicine

## 2019-12-17 DIAGNOSIS — H903 Sensorineural hearing loss, bilateral: Secondary | ICD-10-CM | POA: Diagnosis not present

## 2019-12-17 DIAGNOSIS — H838X3 Other specified diseases of inner ear, bilateral: Secondary | ICD-10-CM | POA: Diagnosis not present

## 2020-01-10 ENCOUNTER — Ambulatory Visit: Payer: Medicare Other | Attending: Internal Medicine

## 2020-01-10 DIAGNOSIS — Z23 Encounter for immunization: Secondary | ICD-10-CM

## 2020-01-10 NOTE — Progress Notes (Signed)
   Covid-19 Vaccination Clinic  Name:  CHIPPER KOUDELKA    MRN: [id number] DOB: 1950/08/12  01/10/2020  Mr. Wenker was observed post Covid-19 immunization for 15 minutes without incident. He was provided with Vaccine Information Sheet and instruction to access the V-Safe system.   Mr. Swayze was instructed to call 911 with any severe reactions post vaccine: Marland Kitchen Difficulty breathing  . Swelling of face and throat  . A fast heartbeat  . A bad rash all over body  . Dizziness and weakness

## 2020-01-14 DIAGNOSIS — K7460 Unspecified cirrhosis of liver: Secondary | ICD-10-CM | POA: Diagnosis not present

## 2020-01-14 DIAGNOSIS — E785 Hyperlipidemia, unspecified: Secondary | ICD-10-CM | POA: Diagnosis not present

## 2020-01-14 DIAGNOSIS — M170 Bilateral primary osteoarthritis of knee: Secondary | ICD-10-CM | POA: Diagnosis not present

## 2020-01-14 DIAGNOSIS — K760 Fatty (change of) liver, not elsewhere classified: Secondary | ICD-10-CM | POA: Diagnosis not present

## 2020-01-14 DIAGNOSIS — K7290 Hepatic failure, unspecified without coma: Secondary | ICD-10-CM | POA: Diagnosis not present

## 2020-01-14 DIAGNOSIS — Z8616 Personal history of COVID-19: Secondary | ICD-10-CM | POA: Diagnosis not present

## 2020-01-14 DIAGNOSIS — Z23 Encounter for immunization: Secondary | ICD-10-CM | POA: Diagnosis not present

## 2020-01-14 DIAGNOSIS — M810 Age-related osteoporosis without current pathological fracture: Secondary | ICD-10-CM | POA: Diagnosis not present

## 2020-01-14 DIAGNOSIS — G4733 Obstructive sleep apnea (adult) (pediatric): Secondary | ICD-10-CM | POA: Diagnosis not present

## 2020-02-06 DIAGNOSIS — D696 Thrombocytopenia, unspecified: Secondary | ICD-10-CM | POA: Diagnosis not present

## 2020-02-06 DIAGNOSIS — Z1331 Encounter for screening for depression: Secondary | ICD-10-CM | POA: Diagnosis not present

## 2020-02-06 DIAGNOSIS — I10 Essential (primary) hypertension: Secondary | ICD-10-CM | POA: Diagnosis not present

## 2020-02-06 DIAGNOSIS — Z6831 Body mass index (BMI) 31.0-31.9, adult: Secondary | ICD-10-CM | POA: Diagnosis not present

## 2020-02-06 DIAGNOSIS — R011 Cardiac murmur, unspecified: Secondary | ICD-10-CM | POA: Diagnosis not present

## 2020-02-06 DIAGNOSIS — Z1389 Encounter for screening for other disorder: Secondary | ICD-10-CM | POA: Diagnosis not present

## 2020-02-06 DIAGNOSIS — Z0001 Encounter for general adult medical examination with abnormal findings: Secondary | ICD-10-CM | POA: Diagnosis not present

## 2020-02-06 DIAGNOSIS — E785 Hyperlipidemia, unspecified: Secondary | ICD-10-CM | POA: Diagnosis not present

## 2020-02-06 DIAGNOSIS — Z125 Encounter for screening for malignant neoplasm of prostate: Secondary | ICD-10-CM | POA: Diagnosis not present

## 2020-02-26 ENCOUNTER — Ambulatory Visit (INDEPENDENT_AMBULATORY_CARE_PROVIDER_SITE_OTHER): Payer: Medicare Other | Admitting: Internal Medicine

## 2020-02-26 ENCOUNTER — Other Ambulatory Visit: Payer: Self-pay

## 2020-02-26 ENCOUNTER — Encounter (INDEPENDENT_AMBULATORY_CARE_PROVIDER_SITE_OTHER): Payer: Self-pay | Admitting: Internal Medicine

## 2020-02-26 VITALS — BP 115/55 | Temp 97.8°F | Ht 67.0 in | Wt 210.3 lb

## 2020-02-26 DIAGNOSIS — K7460 Unspecified cirrhosis of liver: Secondary | ICD-10-CM | POA: Diagnosis not present

## 2020-02-26 DIAGNOSIS — K7290 Hepatic failure, unspecified without coma: Secondary | ICD-10-CM | POA: Diagnosis not present

## 2020-02-26 DIAGNOSIS — K7682 Hepatic encephalopathy: Secondary | ICD-10-CM

## 2020-02-26 NOTE — Patient Instructions (Signed)
Will request copy of blood work from Dr. Delanna Ahmadi office. Patient will call with results of ultrasound when completed.

## 2020-02-26 NOTE — Progress Notes (Addendum)
Presenting complaint;  Follow-up for chronic liver disease.  Database and subjective:  Cody Austin 69 year old Caucasian male who has cirrhosis secondary to NASH and remained well compensated until he had Covid in few weeks later he presented with new onset of hepatic encephalopathy.  With therapy he has returned to baseline.  He was referred to [facility] in case he may eventually need liver transplant.  His last visit there was on 01/14/2020.  Patient states he is doing well.  He was walking regularly until his wife broke her ankle last month into his builder loss for hospitalized with Covid and one of them died at age 65.  He has gained close to 10 pounds.  He is not working to try to drop his weight below 200 pounds. He is having 3 bowel movements per day.  He denies abdominal pain melena or rectal bleeding.  He has not had any episodes of confusion. He is due for abdominal ultrasound.  He has called [facility] so the test could be scheduled here but he was not amenable to get through to them. He said he had hepatitis A and B vaccination on 01/14/2020 since his serology was negative.  He has received both of these vaccines in the past.  He also received flu vaccine. He is hoping that Xifaxan would be approved by manufacture when current period ends.    Current Medications: Outpatient Encounter Medications as of 02/26/2020  Medication Sig  . atorvastatin (LIPITOR) 20 MG tablet Take 20 mg by mouth at bedtime.   Marland Kitchen lactulose (CHRONULAC) 10 GM/15ML solution Take 30 mLs (20 g total) by mouth 3 (three) times daily. (Patient taking differently: Take 20 g by mouth 3 (three) times daily. 30 ml)  . Omega-3 Fatty Acids (OMEGA 3 500 PO) Take 500 mg by mouth daily.   . rifaximin (XIFAXAN) 550 MG TABS tablet Take 1 tablet (550 mg total) by mouth 2 (two) times daily.  Marland Kitchen tolnaftate (TINACTIN) 1 % cream Apply 1 application topically 3 (three) times a week. On leg for ring worm  . zinc gluconate 50 MG  tablet Take 50 mg by mouth at bedtime.   . [DISCONTINUED] fenofibrate (TRICOR) 145 MG tablet Take 145 mg by mouth at bedtime.  (Patient not taking: Reported on 02/26/2020)   No facility-administered encounter medications on file as of 02/26/2020.     Objective: Blood pressure (!) 115/55, temperature 97.8 F (36.6 C), temperature source Oral, height 5' 7"  (1.702 m), weight 210 lb 4.8 oz (95.4 kg). Patient is alert and in no acute distress. He is wearing a mask. He does not have asterixis. Conjunctiva is pink. Sclera is nonicteric Oropharyngeal mucosa is normal. No neck masses or thyromegaly noted. Cardiac exam with regular rhythm normal S1 and S2. No murmur or gallop noted. Lungs are clear to auscultation. Abdomen is full but soft and nontender with organomegaly or masses. No LE edema or clubbing noted.  Labs/studies Results:  CBC Latest Ref Rng & Units 08/29/2019 08/13/2019 06/11/2019  WBC 4.0 - 10.5 K/uL 2.6(L) 3.3(L) 3.1(L)  Hemoglobin 13.0 - 17.0 g/dL 12.2(L) 13.3 11.7(L)  Hematocrit 39 - 52 % 36.5(L) 38.7 33.7(L)  Platelets 150 - 400 K/uL 67(L) 74(LL) 82(L)    CMP Latest Ref Rng & Units 08/29/2019 08/13/2019 06/11/2019  Glucose 70 - 99 mg/dL 109(H) 86 -  BUN 8 - 23 mg/dL 19 17 -  Creatinine 0.61 - 1.24 mg/dL 0.88 1.07 -  Sodium 135 - 145 mmol/L 139 138 -  Potassium 3.5 - 5.1 mmol/L 4.1 4.6 -  Chloride 98 - 111 mmol/L 107 106 -  CO2 22 - 32 mmol/L 25 24 -  Calcium 8.9 - 10.3 mg/dL 9.1 8.9 -  Total Protein 6.5 - 8.1 g/dL 6.0(L) 6.1 5.5(L)  Total Bilirubin 0.3 - 1.2 mg/dL 2.2(H) 1.8(H) 1.8(H)  Alkaline Phos 38 - 126 U/L 82 100 -  AST 15 - 41 U/L 59(H) 71(H) 52(H)  ALT 0 - 44 U/L 32 39 33    Hepatic Function Latest Ref Rng & Units 08/29/2019 08/13/2019 06/11/2019  Total Protein 6.5 - 8.1 g/dL 6.0(L) 6.1 5.5(L)  Albumin 3.5 - 5.0 g/dL 3.2(L) 3.5(L) -  AST 15 - 41 U/L 59(H) 71(H) 52(H)  ALT 0 - 44 U/L 32 39 33  Alk Phosphatase 38 - 126 U/L 82 100 -  Total Bilirubin 0.3 - 1.2  mg/dL 2.2(H) 1.8(H) 1.8(H)  Bilirubin, Direct 0.00 - 0.40 mg/dL - 0.70(H) 0.7(H)    Lab data from 01/14/2020  WBC 3.1 H&H 12.2 and 36  Platelet count 64K. INR 1.55.  Serum sodium 142, potassium 4.1, chloride 114, CO2 21, BUN 15 and creatinine 1.02 Glucose 102 Serum calcium 8.9 Bilirubin 2.5, AP 104, AST 67, ALT 30, total protein 6.4 and albumin 3.0.  Alpha-fetoprotein 7.7 on 06/01/2019. Alpha-fetoprotein 5.0 on 10/11/2019.  Assessment:  #1.  Cirrhosis secondary to NASH complicated by hepatic encephalopathy which occurred soon after he suffered Covid infection.  He appears to be at baseline with lactulose and Xifaxan.  Hopefully Xifaxan will be approved again by manufacturer. He is up-to-date on screening for esophageal varices.  Last EGD was in May 2021. He is due for [facility] screening.  #2.  Asymptomatic cholelithiasis.  Plan:  Continue Xifaxan and lactulose as before. Schedule abdominal ultrasound and Doppler study. Alpha-fetoprotein in January 2022. He will check with Dr. Delanna Ahmadi office if they administer vaccines otherwise will arrange for him to go to [facility] for hepatitis a and B vaccine in December 2021. Office visit in 6 months.  Addendum.  Lab data from Dr. Delanna Ahmadi office reviewed February 06, 2020  WBC 3.5 H&H 12.8 and 38 and platelet count 68K Glucose 93 BUN 16 and creatinine 0.97 Electrolytes normal Bilirubin 2.6, AP 128, AST 77, ALT 36, total protein 5.7 albumin 3.8 Serum calcium 8.8. TSH 2.45 PSA 0.6

## 2020-03-06 ENCOUNTER — Other Ambulatory Visit: Payer: Self-pay

## 2020-03-06 ENCOUNTER — Ambulatory Visit (HOSPITAL_COMMUNITY)
Admission: RE | Admit: 2020-03-06 | Discharge: 2020-03-06 | Disposition: A | Discharge status: Home / Self Care | Payer: Medicare Other | Source: Ambulatory Visit | Attending: Internal Medicine | Admitting: Internal Medicine

## 2020-03-06 DIAGNOSIS — K7460 Unspecified cirrhosis of liver: Secondary | ICD-10-CM | POA: Diagnosis not present

## 2020-04-22 ENCOUNTER — Encounter (INDEPENDENT_AMBULATORY_CARE_PROVIDER_SITE_OTHER): Payer: Self-pay | Admitting: Family Medicine

## 2020-06-18 DIAGNOSIS — L308 Other specified dermatitis: Secondary | ICD-10-CM | POA: Diagnosis not present

## 2020-06-18 DIAGNOSIS — D485 Neoplasm of uncertain behavior of skin: Secondary | ICD-10-CM | POA: Diagnosis not present

## 2020-06-18 DIAGNOSIS — L814 Other melanin hyperpigmentation: Secondary | ICD-10-CM | POA: Diagnosis not present

## 2020-06-18 DIAGNOSIS — L565 Disseminated superficial actinic porokeratosis (DSAP): Secondary | ICD-10-CM | POA: Diagnosis not present

## 2020-06-18 DIAGNOSIS — L570 Actinic keratosis: Secondary | ICD-10-CM | POA: Diagnosis not present

## 2020-06-18 DIAGNOSIS — L578 Other skin changes due to chronic exposure to nonionizing radiation: Secondary | ICD-10-CM | POA: Diagnosis not present

## 2020-06-18 DIAGNOSIS — L821 Other seborrheic keratosis: Secondary | ICD-10-CM | POA: Diagnosis not present

## 2020-06-18 DIAGNOSIS — D225 Melanocytic nevi of trunk: Secondary | ICD-10-CM | POA: Diagnosis not present

## 2020-07-21 ENCOUNTER — Telehealth (INDEPENDENT_AMBULATORY_CARE_PROVIDER_SITE_OTHER): Payer: Self-pay | Admitting: Internal Medicine

## 2020-07-21 NOTE — Telephone Encounter (Signed)
I called and left a message on patient vm asked that he please return call.

## 2020-07-21 NOTE — Telephone Encounter (Signed)
Patient would like to know if he is to continue Xifaxan. He states he resubmitted his patient assistance forms a month and a half ago and has not heard from them.He is out of medication. If he needs to continue he will need a refill sent in or samples. Please advise.

## 2020-07-21 NOTE — Telephone Encounter (Signed)
Patient left message stating he needs a refill on his medication - he doesn't know if Dr Laural Golden wants him to continue taking or not - he needs to know what his plan of care will be - please advise - ph# [phone]

## 2020-07-22 NOTE — Telephone Encounter (Signed)
I spoke with Beverely Low at [employer] and he states the patient had received a letter stating he made too much money and was no longer approved for patient assistance. Patient will need to write an appeal letter, and send in proof of income W2 etc... he will need to send them a test claim of how much it would cost him out of pocket for the medication also will need to send in his house hold expenses. He is aware of all this. I gave the patient a number to reach the patient assistance program [phone] and asked that he give them a call as some of this may be resolved over the phone. I also gave the patient # 60 tablets to last him a month until he gets this resolved.

## 2020-07-22 NOTE — Telephone Encounter (Signed)
Per Dr. Laural Golden yes patient will need to continue.

## 2020-08-12 ENCOUNTER — Encounter (INDEPENDENT_AMBULATORY_CARE_PROVIDER_SITE_OTHER): Payer: Self-pay | Admitting: *Deleted

## 2020-08-12 ENCOUNTER — Other Ambulatory Visit (INDEPENDENT_AMBULATORY_CARE_PROVIDER_SITE_OTHER): Payer: Self-pay

## 2020-08-12 DIAGNOSIS — D638 Anemia in other chronic diseases classified elsewhere: Secondary | ICD-10-CM

## 2020-08-12 DIAGNOSIS — K7682 Hepatic encephalopathy: Secondary | ICD-10-CM

## 2020-08-12 DIAGNOSIS — K7290 Hepatic failure, unspecified without coma: Secondary | ICD-10-CM

## 2020-08-12 MED ORDER — RIFAXIMIN 550 MG PO TABS: 550.0000 mg | ORAL_TABLET | Freq: Two times a day (BID) | ORAL | 11 refills | Status: DC

## 2020-08-20 ENCOUNTER — Other Ambulatory Visit (INDEPENDENT_AMBULATORY_CARE_PROVIDER_SITE_OTHER): Payer: Self-pay | Admitting: *Deleted

## 2020-08-21 DIAGNOSIS — D485 Neoplasm of uncertain behavior of skin: Secondary | ICD-10-CM | POA: Diagnosis not present

## 2020-08-22 ENCOUNTER — Other Ambulatory Visit (INDEPENDENT_AMBULATORY_CARE_PROVIDER_SITE_OTHER): Payer: Self-pay

## 2020-08-22 DIAGNOSIS — K7460 Unspecified cirrhosis of liver: Secondary | ICD-10-CM

## 2020-08-26 ENCOUNTER — Ambulatory Visit (INDEPENDENT_AMBULATORY_CARE_PROVIDER_SITE_OTHER): Payer: Medicare Other | Admitting: Internal Medicine

## 2020-08-26 ENCOUNTER — Encounter (INDEPENDENT_AMBULATORY_CARE_PROVIDER_SITE_OTHER): Payer: Self-pay | Admitting: *Deleted

## 2020-09-01 ENCOUNTER — Telehealth (INDEPENDENT_AMBULATORY_CARE_PROVIDER_SITE_OTHER): Payer: Self-pay | Admitting: *Deleted

## 2020-09-01 DIAGNOSIS — M25561 Pain in right knee: Secondary | ICD-10-CM | POA: Diagnosis not present

## 2020-09-01 NOTE — Telephone Encounter (Signed)
Referring MD/PCP: golding  Procedure: egd w propofol  Reason/Indication:  cirrhosis  Has patient had this procedure before?  Yes, 08/2019  If so, when, by whom and where?    Is there a family history of colon cancer?    Who?  What age when diagnosed?    Is patient diabetic? If yes, Type 1 or Type 2   no      Does patient have prosthetic heart valve or mechanical valve?  no  Do you have a pacemaker/defibrillator?  no  Has patient ever had endocarditis/atrial fibrillation? no  Have you had a stroke/heart attack last 6 mths? no  Does patient use oxygen? no  Has patient had joint replacement within last 12 months?  no  Is patient constipated or do they take laxatives? no  Does patient have a history of alcohol/drug use?  no  Is patient on blood thinner such as Coumadin, Plavix and/or Aspirin? no  Do you take medicine for weight loss?  no  For male patients,: do you still have your menstrual cycle? n/a  Medications: atorvastatin 20 mg daily, xifaxin 550 mg bid, zinc 50 mg daily, fish oil 500 mg daily  Allergies: nkda  Medication Adjustment per Dr Rehman/Dr Jenetta Downer   Procedure date & time: 10/01/20

## 2020-09-02 DIAGNOSIS — E119 Type 2 diabetes mellitus without complications: Secondary | ICD-10-CM | POA: Diagnosis not present

## 2020-09-26 NOTE — Patient Instructions (Signed)
Jarron Curley Hornig  09/26/2020     @PREFPERIOPPHARMACY @   Your procedure is scheduled on 10/01/2020.   Report to [facility] at  [facility].   Call this number if you have problems the morning of surgery:  [phone]   Remember:  Follow the diet and prep instructions given to you by the office.    Take these medicines the morning of surgery with A SIP OF WATER    None     Please brush your teeth.  Do not wear jewelry, make-up or nail polish.  Do not wear lotions, powders, or perfumes, or deodorant.  Do not shave 48 hours prior to surgery.  Men may shave face and neck.  Do not bring valuables to the hospital.  [facility] is not responsible for any belongings or valuables.  Contacts, dentures or bridgework may not be worn into surgery.  Leave your suitcase in the car.  After surgery it may be brought to your room.  For patients admitted to the hospital, discharge time will be determined by your treatment team.  Patients discharged the day of surgery will not be allowed to drive home and must have someone with them for 24 hours.    Special instructions:   DO NOT smoke tobacco or vape for 24 hours before your procedure.  Please read over the following fact sheets that you were given. Anesthesia Post-op Instructions and Care and Recovery After Surgery      Upper Endoscopy, Adult, Care After This sheet gives you information about how to care for yourself after your procedure. Your health care provider may also give you more specific instructions. If you have problems or questions, contact your health careprovider. What can I expect after the procedure? After the procedure, it is common to have: A sore throat. Mild stomach pain or discomfort. Bloating. Nausea. Follow these instructions at home:  Follow instructions from your health care provider about what to eat or drink after your procedure. Return to your normal activities as told by your health care provider.  Ask your health care provider what activities are safe for you. Take over-the-counter and prescription medicines only as told by your health care provider. If you were given a sedative during the procedure, it can affect you for several hours. Do not drive or operate machinery until your health care provider says that it is safe. Keep all follow-up visits as told by your health care provider. This is important. Contact a health care provider if you have: A sore throat that lasts longer than one day. Trouble swallowing. Get help right away if: You vomit blood or your vomit looks like coffee grounds. You have: A fever. Bloody, black, or tarry stools. A severe sore throat or you cannot swallow. Difficulty breathing. Severe pain in your chest or abdomen. Summary After the procedure, it is common to have a sore throat, mild stomach discomfort, bloating, and nausea. If you were given a sedative during the procedure, it can affect you for several hours. Do not drive or operate machinery until your health care provider says that it is safe. Follow instructions from your health care provider about what to eat or drink after your procedure. Return to your normal activities as told by your health care provider. This information is not intended to replace advice given to you by your health care provider. Make sure you discuss any questions you have with your healthcare provider. Document Revised: 04/03/2019 Document Reviewed: 09/05/2017  Elsevier Patient Education  2022 [place] After This sheet gives you information about how to care for yourself after your procedure. Your health care provider may also give you more specific instructions. If you have problems or questions, contact your health careprovider. What can I expect after the procedure? After the procedure, it is common to have: Tiredness. Forgetfulness about what happened after the procedure. Impaired  judgment for important decisions. Nausea or vomiting. Some difficulty with balance. Follow these instructions at home: For the time period you were told by your health care provider:     Rest as needed. Do not participate in activities where you could fall or become injured. Do not drive or use machinery. Do not drink alcohol. Do not take sleeping pills or medicines that cause drowsiness. Do not make important decisions or sign legal documents. Do not take care of children on your own. Eating and drinking Follow the diet that is recommended by your health care provider. Drink enough fluid to keep your urine pale yellow. If you vomit: Drink water, juice, or soup when you can drink without vomiting. Make sure you have little or no nausea before eating solid foods. General instructions Have a responsible adult stay with you for the time you are told. It is important to have someone help care for you until you are awake and alert. Take over-the-counter and prescription medicines only as told by your health care provider. If you have sleep apnea, surgery and certain medicines can increase your risk for breathing problems. Follow instructions from your health care provider about wearing your sleep device: Anytime you are sleeping, including during daytime naps. While taking prescription pain medicines, sleeping medicines, or medicines that make you drowsy. Avoid smoking. Keep all follow-up visits as told by your health care provider. This is important. Contact a health care provider if: You keep feeling nauseous or you keep vomiting. You feel light-headed. You are still sleepy or having trouble with balance after 24 hours. You develop a rash. You have a fever. You have redness or swelling around the IV site. Get help right away if: You have trouble breathing. You have new-onset confusion at home. Summary For several hours after your procedure, you may feel tired. You may also be  forgetful and have poor judgment. Have a responsible adult stay with you for the time you are told. It is important to have someone help care for you until you are awake and alert. Rest as told. Do not drive or operate machinery. Do not drink alcohol or take sleeping pills. Get help right away if you have trouble breathing, or if you suddenly become confused. This information is not intended to replace advice given to you by your health care provider. Make sure you discuss any questions you have with your healthcare provider. Document Revised: 12/20/2019 Document Reviewed: 03/08/2019 Elsevier Patient Education  2022 [employer].

## 2020-09-29 DIAGNOSIS — H6981 Other specified disorders of Eustachian tube, right ear: Secondary | ICD-10-CM | POA: Diagnosis not present

## 2020-09-29 DIAGNOSIS — J310 Chronic rhinitis: Secondary | ICD-10-CM | POA: Diagnosis not present

## 2020-09-29 DIAGNOSIS — R040 Epistaxis: Secondary | ICD-10-CM | POA: Diagnosis not present

## 2020-09-29 DIAGNOSIS — H6521 Chronic serous otitis media, right ear: Secondary | ICD-10-CM | POA: Diagnosis not present

## 2020-09-29 DIAGNOSIS — J343 Hypertrophy of nasal turbinates: Secondary | ICD-10-CM | POA: Diagnosis not present

## 2020-09-29 DIAGNOSIS — H838X3 Other specified diseases of inner ear, bilateral: Secondary | ICD-10-CM | POA: Diagnosis not present

## 2020-09-29 DIAGNOSIS — H9011 Conductive hearing loss, unilateral, right ear, with unrestricted hearing on the contralateral side: Secondary | ICD-10-CM | POA: Diagnosis not present

## 2020-09-30 ENCOUNTER — Other Ambulatory Visit (INDEPENDENT_AMBULATORY_CARE_PROVIDER_SITE_OTHER): Payer: Self-pay | Admitting: Internal Medicine

## 2020-09-30 ENCOUNTER — Other Ambulatory Visit (HOSPITAL_COMMUNITY): Payer: Medicare Other | Attending: Internal Medicine

## 2020-09-30 ENCOUNTER — Encounter (HOSPITAL_COMMUNITY): Payer: Self-pay

## 2020-09-30 ENCOUNTER — Other Ambulatory Visit: Payer: Self-pay

## 2020-09-30 ENCOUNTER — Encounter (HOSPITAL_COMMUNITY)
Admission: RE | Admit: 2020-09-30 | Discharge: 2020-09-30 | Disposition: A | Discharge status: Home / Self Care | Payer: Medicare Other | Source: Ambulatory Visit | Attending: Internal Medicine | Admitting: Internal Medicine

## 2020-09-30 DIAGNOSIS — Z01818 Encounter for other preprocedural examination: Secondary | ICD-10-CM | POA: Diagnosis not present

## 2020-09-30 DIAGNOSIS — K7460 Unspecified cirrhosis of liver: Secondary | ICD-10-CM

## 2020-09-30 LAB — COMPREHENSIVE METABOLIC PANEL
ALT: 41 U/L (ref 0–44)
AST: 66 U/L — ABNORMAL HIGH (ref 15–41)
Albumin: 2.6 g/dL — ABNORMAL LOW (ref 3.5–5.0)
Alkaline Phosphatase: 89 U/L (ref 38–126)
Anion gap: 5 (ref 5–15)
BUN: 19 mg/dL (ref 8–23)
CO2: 24 mmol/L (ref 22–32)
Calcium: 8.3 mg/dL — ABNORMAL LOW (ref 8.9–10.3)
Chloride: 111 mmol/L (ref 98–111)
Creatinine, Ser: 0.85 mg/dL (ref 0.61–1.24)
GFR, Estimated: >60 mL/min (ref >60)
Glucose, Bld: 78 mg/dL (ref 70–99)
Potassium: 3.9 mmol/L (ref 3.5–5.1)
Sodium: 140 mmol/L (ref 135–145)
Total Bilirubin: 4.2 mg/dL — ABNORMAL HIGH (ref 0.3–1.2)
Total Protein: 5.4 g/dL — ABNORMAL LOW (ref 6.5–8.1)

## 2020-09-30 LAB — CBC WITH DIFFERENTIAL/PLATELET
Abs Immature Granulocytes: 0.01 10*3/uL (ref 0.00–0.07)
Basophils Absolute: 0.1 10*3/uL (ref 0.0–0.1)
Basophils Relative: 1 %
Eosinophils Absolute: 0.2 10*3/uL (ref 0.0–0.5)
Eosinophils Relative: 6 %
HCT: 37.2 % — ABNORMAL LOW (ref 39.0–52.0)
Hemoglobin: 12.3 g/dL — ABNORMAL LOW (ref 13.0–17.0)
Immature Granulocytes: 0 %
Lymphocytes Relative: 24 %
Lymphs Abs: 0.9 10*3/uL (ref 0.7–4.0)
MCH: 34.5 pg — ABNORMAL HIGH (ref 26.0–34.0)
MCHC: 33.1 g/dL (ref 30.0–36.0)
MCV: 104.2 fL — ABNORMAL HIGH (ref 80.0–100.0)
Monocytes Absolute: 0.5 10*3/uL (ref 0.1–1.0)
Monocytes Relative: 14 %
Neutro Abs: 1.9 10*3/uL (ref 1.7–7.7)
Neutrophils Relative %: 55 %
Platelets: 69 10*3/uL — ABNORMAL LOW (ref 150–400)
RBC: 3.57 MIL/uL — ABNORMAL LOW (ref 4.22–5.81)
RDW: 16.2 % — ABNORMAL HIGH (ref 11.5–15.5)
WBC: 3.6 10*3/uL — ABNORMAL LOW (ref 4.0–10.5)
nRBC: 0 % (ref 0.0–0.2)

## 2020-09-30 LAB — PROTIME-INR
INR: 1.6 — ABNORMAL HIGH (ref 0.8–1.2)
Prothrombin Time: 19.1 seconds — ABNORMAL HIGH (ref 11.4–15.2)

## 2020-10-01 ENCOUNTER — Ambulatory Visit (HOSPITAL_COMMUNITY): Payer: Medicare Other | Admitting: Anesthesiology

## 2020-10-01 ENCOUNTER — Other Ambulatory Visit: Payer: Self-pay

## 2020-10-01 ENCOUNTER — Encounter (HOSPITAL_COMMUNITY)
Admission: RE | Disposition: A | Discharge status: Home / Self Care | Payer: Self-pay | Source: Home / Self Care | Attending: Internal Medicine

## 2020-10-01 ENCOUNTER — Encounter (HOSPITAL_COMMUNITY): Payer: Self-pay | Admitting: Internal Medicine

## 2020-10-01 ENCOUNTER — Ambulatory Visit (HOSPITAL_COMMUNITY)
Admission: RE | Admit: 2020-10-01 | Discharge: 2020-10-01 | Disposition: A | Discharge status: Home / Self Care | Payer: Medicare Other | Attending: Internal Medicine | Admitting: Internal Medicine

## 2020-10-01 DIAGNOSIS — K2289 Other specified disease of esophagus: Secondary | ICD-10-CM | POA: Diagnosis not present

## 2020-10-01 DIAGNOSIS — I8510 Secondary esophageal varices without bleeding: Secondary | ICD-10-CM | POA: Diagnosis not present

## 2020-10-01 DIAGNOSIS — K7581 Nonalcoholic steatohepatitis (NASH): Secondary | ICD-10-CM | POA: Insufficient documentation

## 2020-10-01 DIAGNOSIS — Z13810 Encounter for screening for upper gastrointestinal disorder: Secondary | ICD-10-CM | POA: Diagnosis not present

## 2020-10-01 DIAGNOSIS — K7290 Hepatic failure, unspecified without coma: Secondary | ICD-10-CM | POA: Diagnosis not present

## 2020-10-01 DIAGNOSIS — K7460 Unspecified cirrhosis of liver: Secondary | ICD-10-CM | POA: Diagnosis not present

## 2020-10-01 DIAGNOSIS — Z79899 Other long term (current) drug therapy: Secondary | ICD-10-CM | POA: Insufficient documentation

## 2020-10-01 DIAGNOSIS — K766 Portal hypertension: Secondary | ICD-10-CM | POA: Diagnosis not present

## 2020-10-01 DIAGNOSIS — D696 Thrombocytopenia, unspecified: Secondary | ICD-10-CM | POA: Insufficient documentation

## 2020-10-01 DIAGNOSIS — K3189 Other diseases of stomach and duodenum: Secondary | ICD-10-CM | POA: Insufficient documentation

## 2020-10-01 HISTORY — PX: ESOPHAGOGASTRODUODENOSCOPY (EGD) WITH PROPOFOL: SHX5813

## 2020-10-01 LAB — AMMONIA: Ammonia: 50 umol/L — ABNORMAL HIGH (ref 9–35)

## 2020-10-01 SURGERY — ESOPHAGOGASTRODUODENOSCOPY (EGD) WITH PROPOFOL
Anesthesia: General

## 2020-10-01 MED ORDER — PROPOFOL 10 MG/ML IV BOLUS
INTRAVENOUS | Status: DC | PRN
  Administered 2020-10-01: 100 mg via INTRAVENOUS

## 2020-10-01 MED ORDER — LACTATED RINGERS IV SOLN: INTRAVENOUS | Status: DC

## 2020-10-01 MED ORDER — PROPOFOL 500 MG/50ML IV EMUL
INTRAVENOUS | Status: DC | PRN
  Administered 2020-10-01: 150 ug/kg/min via INTRAVENOUS

## 2020-10-01 MED ORDER — LIDOCAINE HCL (CARDIAC) PF 100 MG/5ML IV SOSY
PREFILLED_SYRINGE | INTRAVENOUS | Status: DC | PRN
  Administered 2020-10-01: 50 mg via INTRAVENOUS

## 2020-10-01 NOTE — Transfer of Care (Signed)
Immediate Anesthesia Transfer of Care Note  Patient: Cody Austin  Procedure(s) Performed: ESOPHAGOGASTRODUODENOSCOPY (EGD) WITH PROPOFOL  Patient Location: Short Stay  Anesthesia Type:General  Level of Consciousness: awake, alert  and oriented  Airway & Oxygen Therapy: Patient Spontanous Breathing  Post-op Assessment: Report given to RN, Post -op Vital signs reviewed and stable and Patient moving all extremities X 4  Post vital signs: Reviewed and stable  Last Vitals:  Vitals Value Taken Time  BP    Temp    Pulse    Resp    SpO2      Last Pain:  Vitals:   10/01/20 0935  TempSrc: Oral  PainSc: 0-No pain         Complications: No notable events documented.

## 2020-10-01 NOTE — Anesthesia Procedure Notes (Signed)
Date/Time: 10/01/2020 11:20 AM Performed by: Orlie Dakin, CRNA Pre-anesthesia Checklist: Patient identified, Emergency Drugs available, Suction available and Patient being monitored Patient Re-evaluated:Patient Re-evaluated prior to induction Oxygen Delivery Method: Nasal cannula Induction Type: IV induction Placement Confirmation: positive ETCO2

## 2020-10-01 NOTE — Op Note (Signed)
[facility] Patient Name: Cody Austin Procedure Date: 10/01/2020 10:45 AM MRN: [id number] Date of Birth: December 12, 1950 Attending MD: Hildred Laser , MD CSN: [id number] Age: 70 Admit Type: Outpatient Procedure:                Upper GI endoscopy Indications:              Cirrhosis with suspected esophageal varices,                            Follow-up of esophageal varices Providers:                Hildred Laser, MD, Gwenlyn Fudge, RN, Randa Spike, Technician Referring MD:             Chauncey Mann, MD Medicines:                Propofol per Anesthesia Complications:            No immediate complications. Estimated Blood Loss:     Estimated blood loss: none. Procedure:                Pre-Anesthesia Assessment:                           - Prior to the procedure, a History and Physical                            was performed, and patient medications and                            allergies were reviewed. The patient's tolerance of                            previous anesthesia was also reviewed. The risks                            and benefits of the procedure and the sedation                            options and risks were discussed with the patient.                            All questions were answered, and informed consent                            was obtained. Prior Anticoagulants: The patient has                            taken no previous anticoagulant or antiplatelet                            agents. ASA Grade Assessment: III - A patient with  severe systemic disease. After reviewing the risks                            and benefits, the patient was deemed in                            satisfactory condition to undergo the procedure.                           After obtaining informed consent, the endoscope was                            passed under direct vision. Throughout the                            procedure,  the patient's blood pressure, pulse, and                            oxygen saturations were monitored continuously. The                            [id number]) was introduced through the mouth,                            and advanced to the second part of duodenum. The                            upper GI endoscopy was accomplished without                            difficulty. The patient tolerated the procedure                            well. Scope In: 11:15:04 AM Scope Out: 11:21:24 AM Total Procedure Duration: 0 hours 6 minutes 20 seconds  Findings:      The hypopharynx was normal.      The proximal esophagus was normal.      Grade I varices were found in the distal esophagus.      The Z-line was irregular and was found 42 cm from the incisors.      Mild portal hypertensive gastropathy was found in the entire examined       stomach.      The exam of the stomach was otherwise normal.      The duodenal bulb and second portion of the duodenum were normal. Impression:               - Normal hypopharynx.                           - Normal proximal esophagus.                           - Grade I esophageal varices. Varices were not  large enough to be banded.                           - Z-line irregular, 42 cm from the incisors.                           - Portal hypertensive gastropathy.                           - Normal duodenal bulb and second portion of the                            duodenum.                           - No specimens collected. Moderate Sedation:      Per Anesthesia Care Recommendation:           - Patient has a contact number available for                            emergencies. The signs and symptoms of potential                            delayed complications were discussed with the                            patient. Return to normal activities tomorrow.                            Written discharge instructions were provided to  the                            patient.                           - Resume previous diet today.                           - Continue present medications.                           - Repeat upper endoscopy in 1 year. Procedure Code(s):        --- Professional ---                           [id number], Esophagogastroduodenoscopy, flexible,                            transoral; diagnostic, including collection of                            specimen(s) by brushing or washing, when performed                            (separate procedure) Diagnosis Code(s):        --- Professional ---  K74.60, Unspecified cirrhosis of liver                           I85.10, Secondary esophageal varices without                            bleeding                           K22.8, Other specified diseases of esophagus                           K76.6, Portal hypertension                           K31.89, Other diseases of stomach and duodenum CPT copyright 2019 [employer]. All rights reserved. The codes documented in this report are preliminary and upon coder review may  be revised to meet current compliance requirements. Hildred Laser, MD Hildred Laser, MD 10/01/2020 11:30:30 AM This report has been signed electronically. Number of Addenda: 0

## 2020-10-01 NOTE — Anesthesia Preprocedure Evaluation (Addendum)
Anesthesia Evaluation  Patient identified by MRN, date of birth, ID band Patient awake    Reviewed: Allergy & Precautions, H&P , NPO status , Patient's Chart, lab work & pertinent test results, reviewed documented beta blocker date and time   Airway Mallampati: II  TM Distance: >3 FB Neck ROM: full    Dental no notable dental hx.    Pulmonary neg pulmonary ROS, sleep apnea ,    Pulmonary exam normal breath sounds clear to auscultation       Cardiovascular Exercise Tolerance: Good + Peripheral Vascular Disease  + Valvular Problems/Murmurs  Rhythm:regular Rate:Normal     Neuro/Psych negative neurological ROS  negative psych ROS   GI/Hepatic negative GI ROS, Neg liver ROS, (+) Hepatitis -, Autoimmune  Endo/Other  negative endocrine ROS  Renal/GU negative Renal ROS  negative genitourinary   Musculoskeletal   Abdominal   Peds  Hematology negative hematology ROS (+) Blood dyscrasia, anemia ,   Anesthesia Other Findings   Reproductive/Obstetrics negative OB ROS                            Anesthesia Physical Anesthesia Plan  ASA: 3  Anesthesia Plan: General   Post-op Pain Management:    Induction:   PONV Risk Score and Plan: Propofol infusion  Airway Management Planned:   Additional Equipment:   Intra-op Plan:   Post-operative Plan:   Informed Consent: I have reviewed the patients History and Physical, chart, labs and discussed the procedure including the risks, benefits and alternatives for the proposed anesthesia with the patient or authorized representative who has indicated his/her understanding and acceptance.     Dental Advisory Given  Plan Discussed with: CRNA  Anesthesia Plan Comments:        Anesthesia Quick Evaluation

## 2020-10-01 NOTE — Discharge Instructions (Signed)
Resume usual medications and diet as before. No driving for 24 hours. Physician will call with results of alpha-fetoprotein when completed. Abdominal ultrasound to be scheduled.  Office will call.

## 2020-10-01 NOTE — Anesthesia Postprocedure Evaluation (Signed)
Anesthesia Post Note  Patient: Psalm Schappell Bors  Procedure(s) Performed: ESOPHAGOGASTRODUODENOSCOPY (EGD) WITH PROPOFOL  Patient location during evaluation: Phase II Anesthesia Type: General Level of consciousness: awake Pain management: pain level controlled Vital Signs Assessment: post-procedure vital signs reviewed and stable Respiratory status: spontaneous breathing and respiratory function stable Cardiovascular status: blood pressure returned to baseline and stable Postop Assessment: no headache and no apparent nausea or vomiting Anesthetic complications: no Comments: Late entry   No notable events documented.   Last Vitals:  Vitals:   10/01/20 0935 10/01/20 1124  BP: (!) 130/56 122/62  Pulse: (!) 58 68  Resp: 15 17  Temp: 36.6 C 36.5 C  SpO2: 99% 100%    Last Pain:  Vitals:   10/01/20 1124  TempSrc: Oral  PainSc: 0-No pain                 Louann Sjogren

## 2020-10-01 NOTE — H&P (Signed)
Cody Austin is an 70 y.o. male.   Chief Complaint: Patient is here for esophagogastroduodenoscopy and possible esophageal variceal banding. HPI: Patient is 70 year old Caucasian male with history of cirrhosis secondary to [facility] complicated by hepatic encephalopathy and thrombocytopenia who was also found to have grade 1 esophageal varices back in May 2021.  He is returning for repeat EGD to evaluate varices and if they have enlarged these would be banded.  He has no complaints.  His bowels move at least twice daily.  He denies abdominal pain melena or rectal bleeding.  He has not had any episodes of confusion.  He was seen by Dr. Benjamine Mola yesterday and wants to make sure he can take prednisone for few days as recommended by him.  Patient told that he cannot take prednisone as recommended by Dr. Benjamine Mola. Meld score based on lab data from yesterday is 17.14.  Past Medical History:  Diagnosis Date   Arthritis    Cirrhosis, non-alcoholic ([name])    Fatty liver    Heart murmur    History of kidney stones    NASH Liver cirrhosis secondary to NASH (nonalcoholic steatohepatitis) 06/01/2019   Other pancytopenia ([name]) 11/19/2014   Peripheral arterial disease ([place]) 05/28/2019   Sleep apnea    uses CPAP    Past Surgical History:  Procedure Laterality Date   CATARACT EXTRACTION W/PHACO Right 08/12/2014   Procedure: CATARACT EXTRACTION PHACO AND INTRAOCULAR LENS PLACEMENT ([place]);  Surgeon: [name], MD;  Location: [facility];  Service: Ophthalmology;  Laterality: Right;  CDE 9.32   CATARACT EXTRACTION W/PHACO Left 08/26/2014   Procedure: CATARACT EXTRACTION PHACO AND INTRAOCULAR LENS PLACEMENT ([facility]);  Surgeon: [name], MD;  Location: [facility];  Service: Ophthalmology;  Laterality: Left;  CDE:  9.49   COLONOSCOPY N/A 12/13/2014   Procedure: COLONOSCOPY;  Surgeon: Rogene Houston, MD;  Location: [facility];  Service: Endoscopy;  Laterality: N/A;  100   ESOPHAGEAL BANDING N/A 08/31/2019   Procedure: ESOPHAGEAL BANDING;   Surgeon: Rogene Houston, MD;  Location: [facility];  Service: Endoscopy;  Laterality: N/A;   ESOPHAGOGASTRODUODENOSCOPY N/A 12/13/2014   Procedure: ESOPHAGOGASTRODUODENOSCOPY (EGD);  Surgeon: Rogene Houston, MD;  Location: [facility];  Service: Endoscopy;  Laterality: N/A;   ESOPHAGOGASTRODUODENOSCOPY (EGD) WITH PROPOFOL N/A 08/31/2019   Procedure: ESOPHAGOGASTRODUODENOSCOPY (EGD) WITH PROPOFOL;  Surgeon: Rogene Houston, MD;  Location: [facility];  Service: Endoscopy;  Laterality: N/A;  1200   HERNIA REPAIR Right    Inguinal hernia   TONSILLECTOMY      Family History  Problem Relation Age of Onset   Cancer Father    Aneurysm Father    Social History:  reports that he has never smoked. He has never used smokeless tobacco. He reports current alcohol use. He reports that he does not use drugs.  Allergies: No Known Allergies  Medications Prior to Admission  Medication Sig Dispense Refill   atorvastatin (LIPITOR) 20 MG tablet Take 20 mg by mouth at bedtime.   0   lactulose (CHRONULAC) 10 GM/15ML solution Take 30 mLs (20 g total) by mouth 3 (three) times daily. (Patient taking differently: Take 10 g by mouth 3 (three) times daily.) 2700 mL 5   Omega-3 Fatty Acids (OMEGA 3 500 PO) Take 500 mg by mouth daily.      rifaximin (XIFAXAN) 550 MG TABS tablet Take 1 tablet (550 mg total) by mouth 2 (two) times daily. (Patient taking differently: Take 550 mg by mouth 2 (two)  times daily.) 60 tablet 11   zinc gluconate 50 MG tablet Take 50 mg by mouth at bedtime.       Results for orders placed or performed during the hospital encounter of 09/30/20 (from the past 48 hour(s))  CBC with Differential/Platelet     Status: Abnormal   Collection Time: 09/30/20  9:28 AM  Result Value Ref Range   WBC 3.6 (L) 4.0 - 10.5 K/uL   RBC 3.57 (L) 4.22 - 5.81 MIL/uL   Hemoglobin 12.3 (L) 13.0 - 17.0 g/dL   HCT 37.2 (L) 39.0 - 52.0 %   MCV 104.2 (H) 80.0 - 100.0 fL   MCH 34.5 (H) 26.0 - 34.0 pg   MCHC  33.1 30.0 - 36.0 g/dL   RDW 16.2 (H) 11.5 - 15.5 %   Platelets 69 (L) 150 - 400 K/uL    Comment: SPECIMEN CHECKED FOR CLOTS Immature Platelet Fraction may be clinically indicated, consider ordering this additional test [id number] PLATELET COUNT CONFIRMED BY SMEAR    nRBC 0.0 0.0 - 0.2 %   Neutrophils Relative % 55 %   Neutro Abs 1.9 1.7 - 7.7 K/uL   Lymphocytes Relative 24 %   Lymphs Abs 0.9 0.7 - 4.0 K/uL   Monocytes Relative 14 %   Monocytes Absolute 0.5 0.1 - 1.0 K/uL   Eosinophils Relative 6 %   Eosinophils Absolute 0.2 0.0 - 0.5 K/uL   Basophils Relative 1 %   Basophils Absolute 0.1 0.0 - 0.1 K/uL   Immature Granulocytes 0 %   Abs Immature Granulocytes 0.01 0.00 - 0.07 K/uL    Comment: Performed at [facility], [address] [place], [place] [address]  Comprehensive metabolic panel     Status: Abnormal   Collection Time: 09/30/20  9:28 AM  Result Value Ref Range   Sodium 140 135 - 145 mmol/L   Potassium 3.9 3.5 - 5.1 mmol/L   Chloride 111 98 - 111 mmol/L   CO2 24 22 - 32 mmol/L   Glucose, Bld 78 70 - 99 mg/dL    Comment: Glucose reference range applies only to samples taken after fasting for at least 8 hours.   BUN 19 8 - 23 mg/dL   Creatinine, Ser 0.85 0.61 - 1.24 mg/dL   Calcium 8.3 (L) 8.9 - 10.3 mg/dL   Total Protein 5.4 (L) 6.5 - 8.1 g/dL   Albumin 2.6 (L) 3.5 - 5.0 g/dL   AST 66 (H) 15 - 41 U/L   ALT 41 0 - 44 U/L   Alkaline Phosphatase 89 38 - 126 U/L   Total Bilirubin 4.2 (H) 0.3 - 1.2 mg/dL   GFR, Estimated >60 >60 mL/min    Comment: (NOTE) Calculated using the CKD-EPI Creatinine Equation (2021)    Anion gap 5 5 - 15    Comment: Performed at [facility], [address], [place], [place] [address]  Protime-INR     Status: Abnormal   Collection Time: 09/30/20  9:28 AM  Result Value Ref Range   Prothrombin Time 19.1 (H) 11.4 - 15.2 seconds   INR 1.6 (H) 0.8 - 1.2    Comment: (NOTE) INR goal varies based on device and disease states. Performed at [facility], [address], [place], [place] [address]    No results found.  Review of Systems  Blood pressure (!) 130/56, pulse (!) 58, temperature 97.9 F (36.6 C), temperature source Oral, resp. rate 15, SpO2 99 %. Physical Exam HENT:     Mouth/Throat:  Mouth: Mucous membranes are moist.     Pharynx: Oropharynx is clear.  Eyes:     General: No scleral icterus.    Conjunctiva/sclera: Conjunctivae normal.  Cardiovascular:     Rate and Rhythm: Normal rate and regular rhythm.     Heart sounds: Murmur heard.     Comments: Grade 2/6 systolic murmur best noted at aortic area. Pulmonary:     Effort: Pulmonary effort is normal.     Breath sounds: Normal breath sounds.  Abdominal:     General: There is no distension.     Palpations: Abdomen is soft. There is no mass.     Tenderness: There is no abdominal tenderness.  Musculoskeletal:        General: Swelling present.     Cervical back: Neck supple.     Comments: 1+ pitting edema around ankles.  Lymphadenopathy:     Cervical: No cervical adenopathy.  Skin:    General: Skin is warm and dry.  Neurological:     Mental Status: He is alert.     Comments: Asterixis absent.     Assessment/Plan Cirrhosis secondary to NASH. Esophagogastroduodenoscopy to assess and band esophageal varices.  Hildred Laser, MD 10/01/2020, 11:06 AM

## 2020-10-02 LAB — AFP TUMOR MARKER: AFP, Serum, Tumor Marker: 6.1 ng/mL (ref 0.0–8.4)

## 2020-10-06 ENCOUNTER — Ambulatory Visit (HOSPITAL_COMMUNITY)
Admission: RE | Admit: 2020-10-06 | Discharge: 2020-10-06 | Disposition: A | Discharge status: Home / Self Care | Payer: Medicare Other | Source: Ambulatory Visit | Attending: Internal Medicine | Admitting: Internal Medicine

## 2020-10-06 ENCOUNTER — Other Ambulatory Visit: Payer: Self-pay

## 2020-10-06 DIAGNOSIS — N281 Cyst of kidney, acquired: Secondary | ICD-10-CM | POA: Diagnosis not present

## 2020-10-06 DIAGNOSIS — K7460 Unspecified cirrhosis of liver: Secondary | ICD-10-CM | POA: Diagnosis not present

## 2020-10-06 DIAGNOSIS — K7581 Nonalcoholic steatohepatitis (NASH): Secondary | ICD-10-CM | POA: Diagnosis not present

## 2020-10-06 DIAGNOSIS — R161 Splenomegaly, not elsewhere classified: Secondary | ICD-10-CM | POA: Diagnosis not present

## 2020-10-08 ENCOUNTER — Other Ambulatory Visit (INDEPENDENT_AMBULATORY_CARE_PROVIDER_SITE_OTHER): Payer: Self-pay

## 2020-10-08 ENCOUNTER — Encounter (HOSPITAL_COMMUNITY): Payer: Self-pay | Admitting: Internal Medicine

## 2020-10-08 DIAGNOSIS — K7460 Unspecified cirrhosis of liver: Secondary | ICD-10-CM

## 2020-10-08 DIAGNOSIS — R9389 Abnormal findings on diagnostic imaging of other specified body structures: Secondary | ICD-10-CM

## 2020-10-08 DIAGNOSIS — I81 Portal vein thrombosis: Secondary | ICD-10-CM

## 2020-10-27 DIAGNOSIS — H838X3 Other specified diseases of inner ear, bilateral: Secondary | ICD-10-CM | POA: Diagnosis not present

## 2020-10-27 DIAGNOSIS — H903 Sensorineural hearing loss, bilateral: Secondary | ICD-10-CM | POA: Diagnosis not present

## 2020-10-27 DIAGNOSIS — H6983 Other specified disorders of Eustachian tube, bilateral: Secondary | ICD-10-CM | POA: Diagnosis not present

## 2020-10-29 ENCOUNTER — Ambulatory Visit (HOSPITAL_COMMUNITY)
Admission: RE | Admit: 2020-10-29 | Discharge: 2020-10-29 | Disposition: A | Discharge status: Home / Self Care | Payer: Medicare Other | Source: Ambulatory Visit | Attending: Internal Medicine | Admitting: Internal Medicine

## 2020-10-29 ENCOUNTER — Other Ambulatory Visit: Payer: Self-pay

## 2020-10-29 DIAGNOSIS — K863 Pseudocyst of pancreas: Secondary | ICD-10-CM | POA: Diagnosis not present

## 2020-10-29 DIAGNOSIS — I81 Portal vein thrombosis: Secondary | ICD-10-CM | POA: Diagnosis not present

## 2020-10-29 DIAGNOSIS — R9389 Abnormal findings on diagnostic imaging of other specified body structures: Secondary | ICD-10-CM

## 2020-10-29 DIAGNOSIS — I868 Varicose veins of other specified sites: Secondary | ICD-10-CM | POA: Diagnosis not present

## 2020-10-29 DIAGNOSIS — K7469 Other cirrhosis of liver: Secondary | ICD-10-CM | POA: Diagnosis not present

## 2020-10-29 DIAGNOSIS — K8020 Calculus of gallbladder without cholecystitis without obstruction: Secondary | ICD-10-CM | POA: Diagnosis not present

## 2020-10-29 MED ORDER — IOHEXOL 300 MG/ML  SOLN
100.0000 mL | Freq: Once | INTRAMUSCULAR | Status: AC | PRN
  Administered 2020-10-29: 100 mL via INTRAVENOUS

## 2020-10-31 DIAGNOSIS — Z23 Encounter for immunization: Secondary | ICD-10-CM | POA: Diagnosis not present

## 2020-11-05 ENCOUNTER — Other Ambulatory Visit (INDEPENDENT_AMBULATORY_CARE_PROVIDER_SITE_OTHER): Payer: Self-pay

## 2020-11-05 DIAGNOSIS — K760 Fatty (change of) liver, not elsewhere classified: Secondary | ICD-10-CM

## 2020-11-05 DIAGNOSIS — I739 Peripheral vascular disease, unspecified: Secondary | ICD-10-CM

## 2020-11-05 DIAGNOSIS — K7460 Unspecified cirrhosis of liver: Secondary | ICD-10-CM

## 2020-11-17 DIAGNOSIS — K760 Fatty (change of) liver, not elsewhere classified: Secondary | ICD-10-CM | POA: Diagnosis not present

## 2020-11-17 DIAGNOSIS — K7460 Unspecified cirrhosis of liver: Secondary | ICD-10-CM | POA: Diagnosis not present

## 2020-11-17 DIAGNOSIS — I739 Peripheral vascular disease, unspecified: Secondary | ICD-10-CM | POA: Diagnosis not present

## 2020-11-18 LAB — COMPREHENSIVE METABOLIC PANEL
AG Ratio: 1.1 (calc) (ref 1.0–2.5)
ALT: 34 U/L (ref 9–46)
AST: 68 U/L — ABNORMAL HIGH (ref 10–35)
Albumin: 2.6 g/dL — ABNORMAL LOW (ref 3.6–5.1)
Alkaline phosphatase (APISO): 113 U/L (ref 35–144)
BUN: 18 mg/dL (ref 7–25)
CO2: 21 mmol/L (ref 20–32)
Calcium: 7.8 mg/dL — ABNORMAL LOW (ref 8.6–10.3)
Chloride: 113 mmol/L — ABNORMAL HIGH (ref 98–110)
Creat: 1.03 mg/dL (ref 0.70–1.28)
Globulin: 2.4 g/dL (calc) (ref 1.9–3.7)
Glucose, Bld: 85 mg/dL (ref 65–99)
Potassium: 4.3 mmol/L (ref 3.5–5.3)
Sodium: 143 mmol/L (ref 135–146)
Total Bilirubin: 3.6 mg/dL — ABNORMAL HIGH (ref 0.2–1.2)
Total Protein: 5 g/dL — ABNORMAL LOW (ref 6.1–8.1)

## 2020-11-18 LAB — PROTIME-INR
INR: 1.5 — ABNORMAL HIGH
Prothrombin Time: 14.5 s — ABNORMAL HIGH (ref 9.0–11.5)

## 2020-11-20 ENCOUNTER — Other Ambulatory Visit (INDEPENDENT_AMBULATORY_CARE_PROVIDER_SITE_OTHER): Payer: Self-pay | Admitting: *Deleted

## 2020-11-20 ENCOUNTER — Telehealth (INDEPENDENT_AMBULATORY_CARE_PROVIDER_SITE_OTHER): Payer: Self-pay | Admitting: *Deleted

## 2020-11-20 DIAGNOSIS — R251 Tremor, unspecified: Secondary | ICD-10-CM | POA: Diagnosis not present

## 2020-11-20 DIAGNOSIS — R899 Unspecified abnormal finding in specimens from other organs, systems and tissues: Secondary | ICD-10-CM | POA: Diagnosis not present

## 2020-11-20 DIAGNOSIS — K7290 Hepatic failure, unspecified without coma: Secondary | ICD-10-CM | POA: Diagnosis not present

## 2020-11-20 DIAGNOSIS — K7682 Hepatic encephalopathy: Secondary | ICD-10-CM

## 2020-11-20 DIAGNOSIS — K7460 Unspecified cirrhosis of liver: Secondary | ICD-10-CM

## 2020-11-20 MED ORDER — LACTULOSE 10 GM/15ML PO SOLN: 20.0000 g | Freq: Three times a day (TID) | ORAL | 5 refills | Status: DC

## 2020-11-20 NOTE — Telephone Encounter (Signed)
Wife carolyn called and states in the past his ammonia levels were abnormal and he was acting the same way he has been for the past couple of weeks. Hands shaking for about 2 weeks worse past 2 -3 days. Before he acted "spaced out" she said he is not like that right now but does not seem himself. States seems ok today besides the shaking. He is out taking a walk for exercise while i'm talking to her. Shivers off and on. No fever. Advised to call 911 or take him to ED if he is not alert or "spaced out" like he was last November when she said he had to be admitted to ED. She verbalized understanding. Paged dr Laural Golden and he wanted to order serum ammonia. Order placed and wife notified to pick up order at the office and take to [facility] and will notify pt when results are available.

## 2020-11-21 ENCOUNTER — Other Ambulatory Visit (INDEPENDENT_AMBULATORY_CARE_PROVIDER_SITE_OTHER): Payer: Self-pay | Admitting: *Deleted

## 2020-11-21 DIAGNOSIS — R7989 Other specified abnormal findings of blood chemistry: Secondary | ICD-10-CM

## 2020-11-21 DIAGNOSIS — R899 Unspecified abnormal finding in specimens from other organs, systems and tissues: Secondary | ICD-10-CM

## 2020-11-21 LAB — AMMONIA: Ammonia: 151 umol/L — ABNORMAL HIGH (ref <=72)

## 2020-11-21 NOTE — Telephone Encounter (Signed)
Result of ammonia level was 151. Paged dr Laural Golden and he states to call wife and let her know and to repeat bw for ammonia level mid next week. Called and discussed with pt's wife and she verbalized understanding order put in and placed up front for pick up to take to lab to do on 8/10.

## 2020-11-26 DIAGNOSIS — R7989 Other specified abnormal findings of blood chemistry: Secondary | ICD-10-CM | POA: Diagnosis not present

## 2020-11-26 DIAGNOSIS — R899 Unspecified abnormal finding in specimens from other organs, systems and tissues: Secondary | ICD-10-CM | POA: Diagnosis not present

## 2020-11-27 LAB — AMMONIA: Ammonia: 53 umol/L (ref <=72)

## 2020-12-30 ENCOUNTER — Other Ambulatory Visit: Payer: Self-pay

## 2020-12-30 ENCOUNTER — Ambulatory Visit (INDEPENDENT_AMBULATORY_CARE_PROVIDER_SITE_OTHER): Payer: Medicare Other | Admitting: Internal Medicine

## 2020-12-30 ENCOUNTER — Encounter (INDEPENDENT_AMBULATORY_CARE_PROVIDER_SITE_OTHER): Payer: Self-pay | Admitting: Internal Medicine

## 2020-12-30 VITALS — BP 118/66 | HR 66 | Temp 98.0°F | Ht 67.0 in | Wt 225.8 lb

## 2020-12-30 DIAGNOSIS — K862 Cyst of pancreas: Secondary | ICD-10-CM

## 2020-12-30 DIAGNOSIS — D638 Anemia in other chronic diseases classified elsewhere: Secondary | ICD-10-CM | POA: Diagnosis not present

## 2020-12-30 DIAGNOSIS — K7460 Unspecified cirrhosis of liver: Secondary | ICD-10-CM

## 2020-12-30 DIAGNOSIS — G9340 Encephalopathy, unspecified: Secondary | ICD-10-CM

## 2020-12-30 NOTE — Progress Notes (Signed)
Presenting complaint;  Follow for chronic liver disease.  Database and subjective:  Patient is 70 year old Caucasian male who is here for scheduled visit. He has over 6-year history of cirrhosis secondary to [place] and he had remained compensated until he developed COVID infection last year leading to decompensation.  He developed hepatic encephalopathy. He developed confusion last month and his serum ammonia was elevated.  It turns out that he was not taking his lactulose. He was last seen in the office in November 2021. He had EGD on 10/01/2020 revealing grade 1 varices. Abdominal ultrasound and Doppler study in June 2022 revealed ascites or focal abnormalities liver.  Portal vein could not be well seen.  Therefore he had CT about 2 months ago and this portal vein was patent but was small with normal directional flow.  Patient states he has not had any episodes of confusion since he has been taking lactulose along with Xifaxan.  He has gained 15 pounds since his last visit.  He states he is not able to do physical activity because of right knee and left hip pain.  Last year he had an injection to his knee which helped.  He however is able to do yard work.  He generally has 2-3 bowel movements per day.  He denies heartburn nausea vomiting abdominal pain melena or rectal bleeding.  He does not understand why he is gaining weight.  He says he eats 2 meals a day plus a snack and he is eating 50% of food what he used to.   Current Medications: Outpatient Encounter Medications as of 12/30/2020  Medication Sig   atorvastatin (LIPITOR) 20 MG tablet Take 20 mg by mouth at bedtime.    lactulose (CHRONULAC) 10 GM/15ML solution Take 30 mLs (20 g total) by mouth 3 (three) times daily.   Omega-3 Fatty Acids (OMEGA 3 500 PO) Take 500 mg by mouth daily.    rifaximin (XIFAXAN) 550 MG TABS tablet Take 1 tablet (550 mg total) by mouth 2 (two) times daily.   zinc gluconate 50 MG tablet Take 50 mg by mouth at  bedtime.    No facility-administered encounter medications on file as of 12/30/2020.     Objective: Blood pressure 118/66, pulse 66, temperature 98 F (36.7 C), temperature source Oral, height 5' 7"  (1.702 m), weight 225 lb 12.8 oz (102.4 kg). Patient is alert and in no acute distress. He does not have asterixis. Conjunctiva is pink. Sclera is nonicteric Oropharyngeal mucosa is normal. No neck masses or thyromegaly noted. Cardiac exam with regular rhythm normal S1 and S2.  He has grade 3 or 6 systolic murmur heard at aortic area as well as left sternal border. Lungs are clear to auscultation. Abdomen is full.  Soft and nontender without hepatosplenomegaly or masses. He has 1+ pitting edema to his legs.  Labs/studies Results:   CBC Latest Ref Rng & Units 09/30/2020 08/29/2019 08/13/2019  WBC 4.0 - 10.5 K/uL 3.6(L) 2.6(L) 3.3(L)  Hemoglobin 13.0 - 17.0 g/dL 12.3(L) 12.2(L) 13.3  Hematocrit 39.0 - 52.0 % 37.2(L) 36.5(L) 38.7  Platelets 150 - 400 K/uL 69(L) 67(L) 74(LL)    CMP Latest Ref Rng & Units 11/17/2020 09/30/2020 08/29/2019  Glucose 65 - 99 mg/dL 85 78 109(H)  BUN 7 - 25 mg/dL 18 19 19   Creatinine 0.70 - 1.28 mg/dL 1.03 0.85 0.88  Sodium 135 - 146 mmol/L 143 140 139  Potassium 3.5 - 5.3 mmol/L 4.3 3.9 4.1  Chloride 98 - 110 mmol/L 113(H) 111 107  CO2 20 - 32 mmol/L 21 24 25   Calcium 8.6 - 10.3 mg/dL 7.8(L) 8.3(L) 9.1  Total Protein 6.1 - 8.1 g/dL 5.0(L) 5.4(L) 6.0(L)  Total Bilirubin 0.2 - 1.2 mg/dL 3.6(H) 4.2(H) 2.2(H)  Alkaline Phos 38 - 126 U/L - 89 82  AST 10 - 35 U/L 68(H) 66(H) 59(H)  ALT 9 - 46 U/L 34 41 32    Hepatic Function Latest Ref Rng & Units 11/17/2020 09/30/2020 08/29/2019  Total Protein 6.1 - 8.1 g/dL 5.0(L) 5.4(L) 6.0(L)  Albumin 3.5 - 5.0 g/dL - 2.6(L) 3.2(L)  AST 10 - 35 U/L 68(H) 66(H) 59(H)  ALT 9 - 46 U/L 34 41 32  Alk Phosphatase 38 - 126 U/L - 89 82  Total Bilirubin 0.2 - 1.2 mg/dL 3.6(H) 4.2(H) 2.2(H)  Bilirubin, Direct 0.00 - 0.40 mg/dL - - -      Alpha-fetoprotein 6.1 on 10/01/2020.  Narrative & Impression  CLINICAL DATA:  Difficulty visualizing the portal veins on ultrasound. Cirrhosis.   EXAM: CT ABDOMEN WITHOUT AND WITH CONTRAST   TECHNIQUE: Multidetector CT imaging of the abdomen was performed following the standard protocol before and following the bolus administration of intravenous contrast.   CONTRAST:  158m OMNIPAQUE IOHEXOL 300 MG/ML  SOLN   COMPARISON:  CT scan 05/31/2019 and ultrasound of 10/06/2020   FINDINGS: Lower chest: Descending aortic and right coronary atherosclerotic calcification. Lower paraesophageal varices.   Hepatobiliary: Hepatic cirrhosis. No abnormal arterial phase enhancing mass in the liver is identified. Contracted gallbladder with wall thickening. Depending gallstones noted in the gallbladder. Intrahepatic and extrahepatic portal veins are somewhat small but patent as on the 05/31/2019 CT.   Pancreas: Uncinate process hypodense lesion slightly indistinct but approximately 1.3 by 1.2 by 1.2 cm on image 57 series 14, previously 1.3 by 1.1 by 1.2 cm by my measurements.   Spleen: The spleen measures 16.1 by 6.0 by 13.4 cm (volume = 680 cm^3), compatible with splenomegal.   Adrenals/Urinary Tract: Both adrenal glands appear normal.   1.2 by 1.1 cm cyst of the left mid to lower kidney posterolaterally within overlying 1.5 by 0.6 cm complex capsular density which was present but not enhancing on the prior MRI from 03/30/2019. The morphology of this lesion makes it difficult to measure for enhancement today but based on the multiplanar reformatting images we do not demonstrate definite enhancement.   Additional small cysts are present. No upper urinary tract calculi are observed.   Stomach/Bowel: Unremarkable   Vascular/Lymphatic: Left renal vein varix with prominent splenorenal shunting. Diminutive portal vein with prominent superior mesenteric vein. Uphill esophageal varices.  Aortoiliac atherosclerotic vascular disease.   Other: No supplemental non-categorized findings.   Musculoskeletal: Lumbar spondylosis and degenerative disc disease causing bilateral foraminal impingement at L3-4, L4-5, and L5-S1.   IMPRESSION: 1. Stable appearance of somewhat diminutive but patent portal veins. Vascular findings associated with cirrhosis including uphill varices, left renal vein varix, and splenorenal shunting. 2. Hepatic cirrhosis without mass. 3. Complex but not enhancing capsular lesion along the left mid to lower kidney. Likely a complex cyst along the capsule. 4. Uncinate process cystic lesion averages 1.2 cm in diameter. This is not increased in size compared to the most recent exam of 05/31/2019 although does appear slightly larger than on 05/14/2016. Possibilities include indolent intraductal put papillary mucinous neoplasm or a postinflammatory cyst. Follow up pancreatic protocol MRI or CT imaging is recommended in 2 years time. This recommendation follows ACR consensus guidelines: Management of Incidental Pancreatic Cysts: A White Paper  of the ACR Incidental Findings Committee. J Am Coll Radiol 3594;[id number]. 5. Aortic Atherosclerosis (ICD10-I70.0). Right coronary atherosclerosis. 6. Multilevel lumbar impingement. 7. Splenomegaly. 8. Cholelithiasis.     Electronically Signed   By: Van Clines M.D.   On: 10/30/2020 13:31   Assessment:  #1.  Hepatic encephalopathy.  He appears to be at baseline.  He needs to continue both Xifaxan and lactulose.  #2.  History of pancreatic cyst.  It appears to be stable.  No further work-up unless it increases in size.  It measured 12 mm and recent study.  #3.  Cirrhosis secondary to NASH.  He has been seen at [facility] and does not want to go back there just for visit alone.  We will continue to monitor his status and meld score and go from there.  Weight gain is bothersome.  Clinically he does not have  ascites. He is up-to-date on [place] screening.  #4.  Anemia appears to be due to chronic disease.   Plan:  Continued lactulose and Xifaxan as before. Patient will go to the lab for CBC, comprehensive chemistry panel and INR. Patient advised to follow-up with his orthopedic surgeon so his joint pain can be addressed so he can be physically more active. He would also check with Dr. Hilma Favors if he should have an echocardiogram. Office visit in 6 months.

## 2020-12-30 NOTE — Patient Instructions (Signed)
Physician will call with results of blood test when completed

## 2021-01-06 DIAGNOSIS — D638 Anemia in other chronic diseases classified elsewhere: Secondary | ICD-10-CM | POA: Diagnosis not present

## 2021-01-06 DIAGNOSIS — K7460 Unspecified cirrhosis of liver: Secondary | ICD-10-CM | POA: Diagnosis not present

## 2021-01-06 LAB — COMPREHENSIVE METABOLIC PANEL
AG Ratio: 1.1 (calc) (ref 1.0–2.5)
ALT: 31 U/L (ref 9–46)
AST: 54 U/L — ABNORMAL HIGH (ref 10–35)
Albumin: 2.5 g/dL — ABNORMAL LOW (ref 3.6–5.1)
Alkaline phosphatase (APISO): 136 U/L (ref 35–144)
BUN: 10 mg/dL (ref 7–25)
CO2: 26 mmol/L (ref 20–32)
Calcium: 8 mg/dL — ABNORMAL LOW (ref 8.6–10.3)
Chloride: 110 mmol/L (ref 98–110)
Creat: 0.98 mg/dL (ref 0.70–1.28)
Globulin: 2.3 g/dL (calc) (ref 1.9–3.7)
Glucose, Bld: 85 mg/dL (ref 65–99)
Potassium: 4.3 mmol/L (ref 3.5–5.3)
Sodium: 140 mmol/L (ref 135–146)
Total Bilirubin: 4 mg/dL — ABNORMAL HIGH (ref 0.2–1.2)
Total Protein: 4.8 g/dL — ABNORMAL LOW (ref 6.1–8.1)

## 2021-01-06 LAB — CBC
HCT: 34.3 % — ABNORMAL LOW (ref 38.5–50.0)
Hemoglobin: 11.6 g/dL — ABNORMAL LOW (ref 13.2–17.1)
MCH: 34.1 pg — ABNORMAL HIGH (ref 27.0–33.0)
MCHC: 33.8 g/dL (ref 32.0–36.0)
MCV: 100.9 fL — ABNORMAL HIGH (ref 80.0–100.0)
MPV: 12 fL (ref 7.5–12.5)
Platelets: 60 10*3/uL — ABNORMAL LOW (ref 140–400)
RBC: 3.4 10*6/uL — ABNORMAL LOW (ref 4.20–5.80)
RDW: 13.9 % (ref 11.0–15.0)
WBC: 3.3 10*3/uL — ABNORMAL LOW (ref 3.8–10.8)

## 2021-01-06 LAB — PROTIME-INR
INR: 1.6 — ABNORMAL HIGH
Prothrombin Time: 15.6 s — ABNORMAL HIGH (ref 9.0–11.5)

## 2021-01-08 DIAGNOSIS — M1711 Unilateral primary osteoarthritis, right knee: Secondary | ICD-10-CM | POA: Diagnosis not present

## 2021-01-15 DIAGNOSIS — M1711 Unilateral primary osteoarthritis, right knee: Secondary | ICD-10-CM | POA: Diagnosis not present

## 2021-01-22 DIAGNOSIS — M1711 Unilateral primary osteoarthritis, right knee: Secondary | ICD-10-CM | POA: Diagnosis not present

## 2021-01-30 DIAGNOSIS — Z23 Encounter for immunization: Secondary | ICD-10-CM | POA: Diagnosis not present

## 2021-02-09 DIAGNOSIS — Z125 Encounter for screening for malignant neoplasm of prostate: Secondary | ICD-10-CM | POA: Diagnosis not present

## 2021-02-09 DIAGNOSIS — Z1331 Encounter for screening for depression: Secondary | ICD-10-CM | POA: Diagnosis not present

## 2021-02-09 DIAGNOSIS — E785 Hyperlipidemia, unspecified: Secondary | ICD-10-CM | POA: Diagnosis not present

## 2021-02-09 DIAGNOSIS — R7309 Other abnormal glucose: Secondary | ICD-10-CM | POA: Diagnosis not present

## 2021-02-09 DIAGNOSIS — E6609 Other obesity due to excess calories: Secondary | ICD-10-CM | POA: Diagnosis not present

## 2021-02-09 DIAGNOSIS — D696 Thrombocytopenia, unspecified: Secondary | ICD-10-CM | POA: Diagnosis not present

## 2021-02-09 DIAGNOSIS — I10 Essential (primary) hypertension: Secondary | ICD-10-CM | POA: Diagnosis not present

## 2021-02-09 DIAGNOSIS — Z0001 Encounter for general adult medical examination with abnormal findings: Secondary | ICD-10-CM | POA: Diagnosis not present

## 2021-02-09 DIAGNOSIS — D61818 Other pancytopenia: Secondary | ICD-10-CM | POA: Diagnosis not present

## 2021-02-09 DIAGNOSIS — G4730 Sleep apnea, unspecified: Secondary | ICD-10-CM | POA: Diagnosis not present

## 2021-02-09 DIAGNOSIS — Z6831 Body mass index (BMI) 31.0-31.9, adult: Secondary | ICD-10-CM | POA: Diagnosis not present

## 2021-02-09 DIAGNOSIS — K7581 Nonalcoholic steatohepatitis (NASH): Secondary | ICD-10-CM | POA: Diagnosis not present

## 2021-02-13 DIAGNOSIS — Z23 Encounter for immunization: Secondary | ICD-10-CM | POA: Diagnosis not present

## 2021-02-18 ENCOUNTER — Telehealth (INDEPENDENT_AMBULATORY_CARE_PROVIDER_SITE_OTHER): Payer: Self-pay

## 2021-02-18 NOTE — Telephone Encounter (Signed)
02/18/2021: Patient came in the office 02/18/2021 @ 11 am stating he received a note from [place] about renewal I gave him a copy of the new patient assistance forms we had filled out our part, with instructions to #1 fill out his portion of application,#2 get print out from [place] at [place] to send with his income and the application to [place] to prevent delay in medication by 04/18/2021. Patient has all information and states understanding. 02/18/2021 @ 11:22 am CLS. Spoke with Trip 02/18/2021 @ 9:am he states the insurance has patient blocked stating patient on a patient assist will not let him run with insurance. He will fax the cash price to the office this am.  Patient assistance forms filled out from our end for the patient assistance for Xifaxan with [employer].Placed on Dr. Olevia Perches desk for his signature. Need to get a price quote from drug store.

## 2021-02-25 ENCOUNTER — Other Ambulatory Visit (HOSPITAL_COMMUNITY): Payer: Self-pay | Admitting: Family Medicine

## 2021-02-25 DIAGNOSIS — I2510 Atherosclerotic heart disease of native coronary artery without angina pectoris: Secondary | ICD-10-CM

## 2021-03-11 ENCOUNTER — Other Ambulatory Visit (INDEPENDENT_AMBULATORY_CARE_PROVIDER_SITE_OTHER): Payer: Self-pay | Admitting: *Deleted

## 2021-03-11 DIAGNOSIS — K7460 Unspecified cirrhosis of liver: Secondary | ICD-10-CM

## 2021-03-11 DIAGNOSIS — K7581 Nonalcoholic steatohepatitis (NASH): Secondary | ICD-10-CM

## 2021-03-16 ENCOUNTER — Encounter (INDEPENDENT_AMBULATORY_CARE_PROVIDER_SITE_OTHER): Payer: Self-pay | Admitting: *Deleted

## 2021-03-23 ENCOUNTER — Other Ambulatory Visit (INDEPENDENT_AMBULATORY_CARE_PROVIDER_SITE_OTHER): Payer: Self-pay

## 2021-03-23 DIAGNOSIS — Z23 Encounter for immunization: Secondary | ICD-10-CM | POA: Diagnosis not present

## 2021-03-23 DIAGNOSIS — C189 Malignant neoplasm of colon, unspecified: Secondary | ICD-10-CM | POA: Diagnosis not present

## 2021-03-23 DIAGNOSIS — K7581 Nonalcoholic steatohepatitis (NASH): Secondary | ICD-10-CM

## 2021-03-23 DIAGNOSIS — Z6832 Body mass index (BMI) 32.0-32.9, adult: Secondary | ICD-10-CM | POA: Diagnosis not present

## 2021-03-23 DIAGNOSIS — R251 Tremor, unspecified: Secondary | ICD-10-CM | POA: Diagnosis not present

## 2021-03-23 DIAGNOSIS — M25569 Pain in unspecified knee: Secondary | ICD-10-CM | POA: Diagnosis not present

## 2021-03-23 DIAGNOSIS — G9340 Encephalopathy, unspecified: Secondary | ICD-10-CM | POA: Diagnosis not present

## 2021-03-23 DIAGNOSIS — K769 Liver disease, unspecified: Secondary | ICD-10-CM | POA: Diagnosis not present

## 2021-03-23 DIAGNOSIS — Z8616 Personal history of COVID-19: Secondary | ICD-10-CM | POA: Diagnosis not present

## 2021-03-23 DIAGNOSIS — D126 Benign neoplasm of colon, unspecified: Secondary | ICD-10-CM | POA: Diagnosis not present

## 2021-03-23 DIAGNOSIS — K7682 Hepatic encephalopathy: Secondary | ICD-10-CM | POA: Diagnosis not present

## 2021-03-23 DIAGNOSIS — K7460 Unspecified cirrhosis of liver: Secondary | ICD-10-CM

## 2021-03-23 DIAGNOSIS — B029 Zoster without complications: Secondary | ICD-10-CM | POA: Diagnosis not present

## 2021-03-23 DIAGNOSIS — J1181 Influenza due to unidentified influenza virus with encephalopathy: Secondary | ICD-10-CM | POA: Diagnosis not present

## 2021-03-24 ENCOUNTER — Telehealth (INDEPENDENT_AMBULATORY_CARE_PROVIDER_SITE_OTHER): Payer: Self-pay

## 2021-03-24 NOTE — Telephone Encounter (Signed)
I spoke with Lisa at [employer] patient assistance foundation. She states they have received patient application and income information. The patient has met all the current requirements on the application and they will verify patient benefits on 04/19/2021, to see if he still qualifies for free medication through their program. 

## 2021-03-25 DIAGNOSIS — K7581 Nonalcoholic steatohepatitis (NASH): Secondary | ICD-10-CM | POA: Diagnosis not present

## 2021-03-25 DIAGNOSIS — K7460 Unspecified cirrhosis of liver: Secondary | ICD-10-CM | POA: Diagnosis not present

## 2021-03-26 LAB — AFP TUMOR MARKER: AFP-Tumor Marker: 8.8 ng/mL — ABNORMAL HIGH (ref <6.1)

## 2021-03-30 ENCOUNTER — Other Ambulatory Visit: Payer: Self-pay

## 2021-03-30 ENCOUNTER — Ambulatory Visit (HOSPITAL_COMMUNITY)
Admission: RE | Admit: 2021-03-30 | Discharge: 2021-03-30 | Disposition: A | Discharge status: Home / Self Care | Payer: Medicare Other | Source: Ambulatory Visit | Attending: Internal Medicine | Admitting: Internal Medicine

## 2021-03-30 DIAGNOSIS — K7460 Unspecified cirrhosis of liver: Secondary | ICD-10-CM | POA: Diagnosis not present

## 2021-03-30 DIAGNOSIS — K8020 Calculus of gallbladder without cholecystitis without obstruction: Secondary | ICD-10-CM | POA: Diagnosis not present

## 2021-03-30 DIAGNOSIS — K7581 Nonalcoholic steatohepatitis (NASH): Secondary | ICD-10-CM | POA: Insufficient documentation

## 2021-03-31 NOTE — Telephone Encounter (Signed)
03/31/2021: Spoke with Deshante at [facility] patient has passed the intial requirements, awaiting 04/19/2021 before they can verify 2023 benefits.s

## 2021-04-09 ENCOUNTER — Other Ambulatory Visit: Payer: Self-pay

## 2021-04-09 ENCOUNTER — Ambulatory Visit (HOSPITAL_COMMUNITY)
Admission: RE | Admit: 2021-04-09 | Discharge: 2021-04-09 | Disposition: A | Discharge status: Home / Self Care | Payer: Medicare Other | Source: Ambulatory Visit | Attending: Family Medicine | Admitting: Family Medicine

## 2021-04-09 DIAGNOSIS — I2510 Atherosclerotic heart disease of native coronary artery without angina pectoris: Secondary | ICD-10-CM | POA: Diagnosis not present

## 2021-04-09 LAB — ECHOCARDIOGRAM COMPLETE
AR max vel: 2.23 cm2
AV Area VTI: 2.12 cm2
AV Area mean vel: 2.1 cm2
AV Mean grad: 19 mmHg
AV Peak grad: 34 mmHg
Ao pk vel: 2.92 m/s
Area-P 1/2: 3.27 cm2
S' Lateral: 3.5 cm

## 2021-04-09 NOTE — Progress Notes (Signed)
*  PRELIMINARY RESULTS* Echocardiogram 2D Echocardiogram has been performed.  Samuel Germany 04/09/2021, 11:52 AM

## 2021-04-14 NOTE — Telephone Encounter (Signed)
04/14/2021: I spoke with Nilda Simmer with [employer] [facility] they still have patient information and will make a determination around the first week in January 2023, and fax out a determination to the office then. If we have not received this by then call the second week in January 2023 for the determination.

## 2021-04-16 ENCOUNTER — Ambulatory Visit (INDEPENDENT_AMBULATORY_CARE_PROVIDER_SITE_OTHER): Payer: Medicare Other | Admitting: Internal Medicine

## 2021-04-21 NOTE — Telephone Encounter (Signed)
I spoke with Remo Lipps at [facility] whom states we should know a determination on patient this week or next.

## 2021-04-23 NOTE — Telephone Encounter (Signed)
I called [employer] Spoke with Cody Austin, she states the patient is still in the "Pass stage" meaning they have everything they needed from [place]. They are still processing benefits investigation. We could hear a determination soon, (this week or next or could be as late as February 2023). She states if the patient starts running low on medication (a weeks worth of medication) before the determination is made we may call them and have them expedite the process.

## 2021-04-27 DIAGNOSIS — H903 Sensorineural hearing loss, bilateral: Secondary | ICD-10-CM | POA: Diagnosis not present

## 2021-04-27 DIAGNOSIS — H6981 Other specified disorders of Eustachian tube, right ear: Secondary | ICD-10-CM | POA: Diagnosis not present

## 2021-04-27 NOTE — Telephone Encounter (Signed)
I called [employer] spoke with Gregary Signs, She state they are still processing the patient application. To call back at the end of the week to check the status(around 05/01/2021). She states the patient had a delivery last on 02/17/2021 and should run out approximately 05/20/2021. This is too early for them to expedite as patient has to only have one weeks worth of medication left to expedite.

## 2021-05-07 NOTE — Telephone Encounter (Signed)
I spoke with Lattie Haw at [facility] she will expedite the request as I informed the patient only has one week of medication left . She states they have no out of pocket quote from the patient. I advised that the patient has tried to get this but the pharmacy is not able to get the quote as it is blocking them as it states the patient is on a patient assistance program. She states she will let them know this information and if they need any more information from [place] they will reach out to me.

## 2021-05-08 NOTE — Telephone Encounter (Signed)
Patient aware he was approved for free Xifaxan from 05/07/2021-04/18/2022. Patient aware of all. Note placed in the reminders to have re enrollment at end of year.

## 2021-05-12 ENCOUNTER — Ambulatory Visit (INDEPENDENT_AMBULATORY_CARE_PROVIDER_SITE_OTHER): Payer: Medicare Other | Admitting: Internal Medicine

## 2021-05-12 ENCOUNTER — Encounter (INDEPENDENT_AMBULATORY_CARE_PROVIDER_SITE_OTHER): Payer: Self-pay | Admitting: Internal Medicine

## 2021-05-12 ENCOUNTER — Other Ambulatory Visit: Payer: Self-pay

## 2021-05-12 VITALS — BP 134/61 | HR 55 | Temp 97.8°F | Ht 67.0 in | Wt 224.0 lb

## 2021-05-12 DIAGNOSIS — K7682 Hepatic encephalopathy: Secondary | ICD-10-CM

## 2021-05-12 DIAGNOSIS — K7581 Nonalcoholic steatohepatitis (NASH): Secondary | ICD-10-CM

## 2021-05-12 DIAGNOSIS — K7460 Unspecified cirrhosis of liver: Secondary | ICD-10-CM

## 2021-05-12 MED ORDER — LACTULOSE 10 GM/15ML PO SOLN: 20.0000 g | Freq: Three times a day (TID) | ORAL | 5 refills | Status: DC

## 2021-05-12 NOTE — Patient Instructions (Signed)
Physician will call with results of blood test when completed.

## 2021-05-12 NOTE — Progress Notes (Signed)
Presenting complaint;  Follow-up for chronic liver disease.  Database and subjective:  Patient is 71 year old Caucasian male who is here for scheduled visit.  He was last seen on 12/30/2020.  He has a history of cirrhosis secondary to NASH which was diagnosed in July 2016 by Dr. Everardo All.  He presented to home with pancytopenia.  His work-up was negative for other etiologies.  His disease remained well compensated until about 2 years ago when he developed COVID infection and when he developed hepatic encephalopathy but has been easily managed with combination of lactulose and Xifaxan.  About 5 years ago his alpha-fetoprotein was over 11 but imaging studies including MRI did not reveal [place].  He has been getting periodic imaging studies.  He has received hepatitis a and B vaccination in the past.  He is being evaluated at [facility] for transplant evaluation.    Patient says he is doing well.  He has 2-3 bowel movements per day.  He denies abdominal pain melena or rectal bleeding.  He says his appetite is not as it used to be.  Now he gets full after eating 1 hamburger but he could eat 2 and did not have any issues.  He is also trying to eat less.  He has not lost any weight since his last visit.  He is not walking or exercising.  He says his wife is having some health issues and he is with her almost 24/7.  He denies nausea vomiting heartburn dysphagia or confusion episodes. He he is stays busy.  He plays card games on his phone and he also does Stroop test regularly.  Current Medications: Outpatient Encounter Medications as of 05/12/2021  Medication Sig   atorvastatin (LIPITOR) 20 MG tablet Take 20 mg by mouth at bedtime.    lactulose (CHRONULAC) 10 GM/15ML solution Take 30 mLs (20 g total) by mouth 3 (three) times daily.   Omega-3 Fatty Acids (OMEGA 3 500 PO) Take 500 mg by mouth daily.    rifaximin (XIFAXAN) 550 MG TABS tablet Take 1 tablet (550 mg total) by mouth 2 (two) times daily.    zinc gluconate 50 MG tablet Take 50 mg by mouth at bedtime.    No facility-administered encounter medications on file as of 05/12/2021.     Objective: Blood pressure 134/61, pulse (!) 55, temperature 97.8 F (36.6 C), temperature source Oral, height 5' 7"  (1.702 m), weight 224 lb (101.6 kg). Patient is alert and in no acute distress. He does not have asterixis. Conjunctiva is pink. Sclera is nonicteric Oropharyngeal mucosa is normal. No neck masses or thyromegaly noted. Cardiac exam with regular rhythm normal S1 and S2.  Grade 2/6 systolic murmur noted at aortic area. Lungs are clear to auscultation. Abdomen is full but soft and nontender with organomegaly or masses.  Shifting dullness is absent. Trace edema around ankles.  Labs/studies Results:   CBC Latest Ref Rng & Units 01/06/2021 09/30/2020 08/29/2019  WBC 3.8 - 10.8 Thousand/uL 3.3(L) 3.6(L) 2.6(L)  Hemoglobin 13.2 - 17.1 g/dL 11.6(L) 12.3(L) 12.2(L)  Hematocrit 38.5 - 50.0 % 34.3(L) 37.2(L) 36.5(L)  Platelets 140 - 400 Thousand/uL 60(L) 69(L) 67(L)    CMP Latest Ref Rng & Units 01/06/2021 11/17/2020 09/30/2020  Glucose 65 - 99 mg/dL 85 85 78  BUN 7 - 25 mg/dL 10 18 19   Creatinine 0.70 - 1.28 mg/dL 0.98 1.03 0.85  Sodium 135 - 146 mmol/L 140 143 140  Potassium 3.5 - 5.3 mmol/L 4.3 4.3 3.9  Chloride  98 - 110 mmol/L 110 113(H) 111  CO2 20 - 32 mmol/L 26 21 24   Calcium 8.6 - 10.3 mg/dL 8.0(L) 7.8(L) 8.3(L)  Total Protein 6.1 - 8.1 g/dL 4.8(L) 5.0(L) 5.4(L)  Total Bilirubin 0.2 - 1.2 mg/dL 4.0(H) 3.6(H) 4.2(H)  Alkaline Phos 38 - 126 U/L - - 89  AST 10 - 35 U/L 54(H) 68(H) 66(H)  ALT 9 - 46 U/L 31 34 41    Hepatic Function Latest Ref Rng & Units 01/06/2021 11/17/2020 09/30/2020  Total Protein 6.1 - 8.1 g/dL 4.8(L) 5.0(L) 5.4(L)  Albumin 3.5 - 5.0 g/dL - - 2.6(L)  AST 10 - 35 U/L 54(H) 68(H) 66(H)  ALT 9 - 46 U/L 31 34 41  Alk Phosphatase 38 - 126 U/L - - 89  Total Bilirubin 0.2 - 1.2 mg/dL 4.0(H) 3.6(H) 4.2(H)  Bilirubin,  Direct 0.00 - 0.40 mg/dL - - -    Alpha-fetoprotein 8.8 on 03/25/2021(ULN 6.1)  INR 1.6 on 01/06/2021.  Abdominal ultrasound on 03/30/2021 Cirrhotic liver without focal abnormalities. Cholelithiasis without evidence of gallbladder wall thickening Limited evaluation of portal vein likely secondary to to-and-fro versus retrograde flow via prominent splenorenal shunt.  Assessment:  #1.  Hepatic encephalopathy.  Clinically she is doing well with combination of lactulose and Xifaxan.  He is trying to keep a steady protein intake rather than intermittent use.  #2.  Cirrhosis secondary to NASH.  He has decompensated disease.  Decompensation occur after he suffered COVID infection in 2021.  Hepatic function has leveled off but his meld score was 16.96 based on blood work 01/06/2021. He is up-to-date regarding [place] screening.  He had AFP level of over 11 five years ago MRI was negative for [place].  AFP levels have come down and actually have stabilized. EGD in June 2022 revealed grade 1 esophageal varices. His meld score will be recalculated and if is trending upwards will arrange for him to follow-up at [facility] where he was last seen in December 2022.  #3.  History of colonic adenomas.  His last exam was in August 2016.  He had 5 small polyps in 2 or 3 of these were tubular adenomas.  He should consider colonoscopy at the time of next EGD.    Plan:  Patient will go to the lab for the following; CBC with differential, comprehensive chemistry panel and INR. Office visit in 4 months.

## 2021-05-13 LAB — CBC WITH DIFFERENTIAL/PLATELET
Absolute Monocytes: 374 cells/uL (ref 200–950)
Basophils Absolute: 50 cells/uL (ref 0–200)
Basophils Relative: 1.4 %
Eosinophils Absolute: 306 cells/uL (ref 15–500)
Eosinophils Relative: 8.5 %
HCT: 33.1 % — ABNORMAL LOW (ref 38.5–50.0)
Hemoglobin: 11.2 g/dL — ABNORMAL LOW (ref 13.2–17.1)
Lymphs Abs: 1058 cells/uL (ref 850–3900)
MCH: 35 pg — ABNORMAL HIGH (ref 27.0–33.0)
MCHC: 33.8 g/dL (ref 32.0–36.0)
MCV: 103.4 fL — ABNORMAL HIGH (ref 80.0–100.0)
MPV: 12.7 fL — ABNORMAL HIGH (ref 7.5–12.5)
Monocytes Relative: 10.4 %
Neutro Abs: 1811 cells/uL (ref 1500–7800)
Neutrophils Relative %: 50.3 %
Platelets: 55 10*3/uL — ABNORMAL LOW (ref 140–400)
RBC: 3.2 10*6/uL — ABNORMAL LOW (ref 4.20–5.80)
RDW: 14.5 % (ref 11.0–15.0)
Total Lymphocyte: 29.4 %
WBC: 3.6 10*3/uL — ABNORMAL LOW (ref 3.8–10.8)

## 2021-05-13 LAB — PROTIME-INR
INR: 1.8 — ABNORMAL HIGH
Prothrombin Time: 17.5 s — ABNORMAL HIGH (ref 9.0–11.5)

## 2021-05-13 LAB — COMPREHENSIVE METABOLIC PANEL
AG Ratio: 0.9 (calc) — ABNORMAL LOW (ref 1.0–2.5)
ALT: 26 U/L (ref 9–46)
AST: 48 U/L — ABNORMAL HIGH (ref 10–35)
Albumin: 2.3 g/dL — ABNORMAL LOW (ref 3.6–5.1)
Alkaline phosphatase (APISO): 150 U/L — ABNORMAL HIGH (ref 35–144)
BUN: 10 mg/dL (ref 7–25)
CO2: 31 mmol/L (ref 20–32)
Calcium: 7.8 mg/dL — ABNORMAL LOW (ref 8.6–10.3)
Chloride: 109 mmol/L (ref 98–110)
Creat: 1.16 mg/dL (ref 0.70–1.28)
Globulin: 2.7 g/dL (calc) (ref 1.9–3.7)
Glucose, Bld: 72 mg/dL (ref 65–99)
Potassium: 3.9 mmol/L (ref 3.5–5.3)
Sodium: 142 mmol/L (ref 135–146)
Total Bilirubin: 5 mg/dL — ABNORMAL HIGH (ref 0.2–1.2)
Total Protein: 5 g/dL — ABNORMAL LOW (ref 6.1–8.1)

## 2021-06-08 ENCOUNTER — Other Ambulatory Visit (INDEPENDENT_AMBULATORY_CARE_PROVIDER_SITE_OTHER): Payer: Self-pay | Admitting: *Deleted

## 2021-06-08 ENCOUNTER — Telehealth (INDEPENDENT_AMBULATORY_CARE_PROVIDER_SITE_OTHER): Payer: Self-pay | Admitting: *Deleted

## 2021-06-08 DIAGNOSIS — K7581 Nonalcoholic steatohepatitis (NASH): Secondary | ICD-10-CM

## 2021-06-08 DIAGNOSIS — K7460 Unspecified cirrhosis of liver: Secondary | ICD-10-CM

## 2021-06-08 MED ORDER — FUROSEMIDE 40 MG PO TABS: 40.0000 mg | ORAL_TABLET | Freq: Every morning | ORAL | 0 refills | Status: DC

## 2021-06-08 MED ORDER — POTASSIUM CHLORIDE CRYS ER 20 MEQ PO TBCR: 20.0000 meq | EXTENDED_RELEASE_TABLET | Freq: Every day | ORAL | 0 refills | Status: DC

## 2021-06-08 NOTE — Telephone Encounter (Signed)
Per dr Laural Golden - pt needs lasix 3m one qam, kcl 20 meq one daily. One month supply of both and 0 refills. Met 7 in one 1 week. Check weight every morning and call next week with weight. - can check weight when he goes over to lab next Monday. .   I called and discussed all with pt and pt verbalized understanding. Meds sent to pharm. Pt states he will pick up orders for lab. Order at front desk.

## 2021-06-10 ENCOUNTER — Other Ambulatory Visit (INDEPENDENT_AMBULATORY_CARE_PROVIDER_SITE_OTHER): Payer: Self-pay

## 2021-06-10 ENCOUNTER — Other Ambulatory Visit (INDEPENDENT_AMBULATORY_CARE_PROVIDER_SITE_OTHER): Payer: Self-pay | Admitting: *Deleted

## 2021-06-10 DIAGNOSIS — L309 Dermatitis, unspecified: Secondary | ICD-10-CM | POA: Diagnosis not present

## 2021-06-10 DIAGNOSIS — K7460 Unspecified cirrhosis of liver: Secondary | ICD-10-CM

## 2021-06-10 DIAGNOSIS — R17 Unspecified jaundice: Secondary | ICD-10-CM | POA: Diagnosis not present

## 2021-06-10 DIAGNOSIS — I872 Venous insufficiency (chronic) (peripheral): Secondary | ICD-10-CM | POA: Diagnosis not present

## 2021-06-10 DIAGNOSIS — R188 Other ascites: Secondary | ICD-10-CM

## 2021-06-10 NOTE — Telephone Encounter (Signed)
Patient called and states he saw dermatologist today and she was concerned about his fluid build up and jaundice in eyes. Started lasix 74m yesterday and took today. Weight at ov on 1/24 was 224. Pt states weight yesterday was 241 lb and today was 240 lbs.   Per dr rLaural Golden- do abdominal ultrasound, cmet, and INR. Cut back on salt as much as he can. I called pt and discussed all and he verbalized understanding. States he will do bw today and I put in orders and left at front for pickup. Pt advised he will get a call back about ultrasound.

## 2021-06-10 NOTE — Telephone Encounter (Signed)
Serena Colonel can you schedule abdominal ultrasound per dr Laural Golden either today or tomorrow.

## 2021-06-11 ENCOUNTER — Telehealth (INDEPENDENT_AMBULATORY_CARE_PROVIDER_SITE_OTHER): Payer: Self-pay | Admitting: *Deleted

## 2021-06-11 ENCOUNTER — Other Ambulatory Visit (INDEPENDENT_AMBULATORY_CARE_PROVIDER_SITE_OTHER): Payer: Self-pay | Admitting: Internal Medicine

## 2021-06-11 DIAGNOSIS — R17 Unspecified jaundice: Secondary | ICD-10-CM

## 2021-06-11 DIAGNOSIS — K7460 Unspecified cirrhosis of liver: Secondary | ICD-10-CM

## 2021-06-11 LAB — COMPREHENSIVE METABOLIC PANEL
AG Ratio: 0.9 (calc) — ABNORMAL LOW (ref 1.0–2.5)
ALT: 91 U/L — ABNORMAL HIGH (ref 9–46)
AST: 177 U/L — ABNORMAL HIGH (ref 10–35)
Albumin: 2.4 g/dL — ABNORMAL LOW (ref 3.6–5.1)
Alkaline phosphatase (APISO): 127 U/L (ref 35–144)
BUN: 14 mg/dL (ref 7–25)
CO2: 27 mmol/L (ref 20–32)
Calcium: 7.9 mg/dL — ABNORMAL LOW (ref 8.6–10.3)
Chloride: 105 mmol/L (ref 98–110)
Creat: 1.25 mg/dL (ref 0.70–1.28)
Globulin: 2.7 g/dL (calc) (ref 1.9–3.7)
Glucose, Bld: 93 mg/dL (ref 65–139)
Potassium: 4 mmol/L (ref 3.5–5.3)
Sodium: 140 mmol/L (ref 135–146)
Total Bilirubin: 7.5 mg/dL — ABNORMAL HIGH (ref 0.2–1.2)
Total Protein: 5.1 g/dL — ABNORMAL LOW (ref 6.1–8.1)

## 2021-06-11 LAB — PROTIME-INR
INR: 1.7 — ABNORMAL HIGH
Prothrombin Time: 17.2 s — ABNORMAL HIGH (ref 9.0–11.5)

## 2021-06-11 MED ORDER — METOLAZONE 5 MG PO TABS: 5.0000 mg | ORAL_TABLET | ORAL | 0 refills | Status: DC

## 2021-06-11 NOTE — Telephone Encounter (Signed)
Pt aware of appt.

## 2021-06-12 ENCOUNTER — Encounter (HOSPITAL_COMMUNITY): Payer: Self-pay

## 2021-06-12 ENCOUNTER — Telehealth (INDEPENDENT_AMBULATORY_CARE_PROVIDER_SITE_OTHER): Payer: Self-pay | Admitting: *Deleted

## 2021-06-12 ENCOUNTER — Ambulatory Visit (HOSPITAL_COMMUNITY): Payer: Medicare Other

## 2021-06-12 NOTE — Telephone Encounter (Signed)
Pt called to claify directions on metolazone.  Per dr Laural Golden - take one tablet 30 mins to 1 hour after lasix every 4 days until the 10 tablets are gone. Pt verbalized understanding.

## 2021-06-15 ENCOUNTER — Telehealth (INDEPENDENT_AMBULATORY_CARE_PROVIDER_SITE_OTHER): Payer: Self-pay

## 2021-06-15 ENCOUNTER — Ambulatory Visit (HOSPITAL_COMMUNITY)
Admission: RE | Admit: 2021-06-15 | Discharge: 2021-06-15 | Disposition: A | Discharge status: Home / Self Care | Payer: Medicare Other | Source: Ambulatory Visit | Attending: Internal Medicine | Admitting: Internal Medicine

## 2021-06-15 DIAGNOSIS — K766 Portal hypertension: Secondary | ICD-10-CM | POA: Diagnosis not present

## 2021-06-15 DIAGNOSIS — R17 Unspecified jaundice: Secondary | ICD-10-CM | POA: Diagnosis not present

## 2021-06-15 DIAGNOSIS — K7469 Other cirrhosis of liver: Secondary | ICD-10-CM | POA: Diagnosis not present

## 2021-06-15 DIAGNOSIS — K7460 Unspecified cirrhosis of liver: Secondary | ICD-10-CM | POA: Insufficient documentation

## 2021-06-15 DIAGNOSIS — K862 Cyst of pancreas: Secondary | ICD-10-CM | POA: Diagnosis not present

## 2021-06-15 DIAGNOSIS — N281 Cyst of kidney, acquired: Secondary | ICD-10-CM | POA: Diagnosis not present

## 2021-06-15 MED ORDER — GADOBUTROL 1 MMOL/ML IV SOLN
10.0000 mL | Freq: Once | INTRAVENOUS | Status: AC | PRN
  Administered 2021-06-15: 10 mL via INTRAVENOUS

## 2021-06-15 NOTE — Telephone Encounter (Signed)
Patient called back stating Dr. Laural Golden wanted his weights after starting Lasix.  06/09/2021=241 06/10/2021=240 06/11/2021=240.5 06/12/2021=239.8 06/13/2021=236.5 06/14/2021=232.5 06/15/2021=229.4.   I advised the patient we would let Dr. Laural Golden know of these tomorrow when he is in the office.

## 2021-06-15 NOTE — Telephone Encounter (Signed)
Patient called back stating Dr. Laural Golden wanted his weights after starting Lasix.  06/09/2021=241 06/10/2021=240 06/11/2021=240.5 06/12/2021=239.8 06/13/2021=236.5 06/14/2021=232.5 06/15/2021=229.4.  I advised the patient we would let Dr. Laural Golden know of these tomorrow when he is in the office.

## 2021-06-15 NOTE — Telephone Encounter (Signed)
error 

## 2021-06-16 ENCOUNTER — Telehealth (INDEPENDENT_AMBULATORY_CARE_PROVIDER_SITE_OTHER): Payer: Self-pay

## 2021-06-16 DIAGNOSIS — K7682 Hepatic encephalopathy: Secondary | ICD-10-CM

## 2021-06-16 DIAGNOSIS — I739 Peripheral vascular disease, unspecified: Secondary | ICD-10-CM

## 2021-06-16 DIAGNOSIS — G4733 Obstructive sleep apnea (adult) (pediatric): Secondary | ICD-10-CM

## 2021-06-16 DIAGNOSIS — K7460 Unspecified cirrhosis of liver: Secondary | ICD-10-CM

## 2021-06-16 NOTE — Telephone Encounter (Signed)
Per Dr. Laural Golden he is satified with the weight loss. Wants patient to have a Bmet on 06/17/2021. Patient aware of all and he will pick up lab orders at our front office tomorrow 06/17/2021.

## 2021-06-17 ENCOUNTER — Telehealth (INDEPENDENT_AMBULATORY_CARE_PROVIDER_SITE_OTHER): Payer: Self-pay

## 2021-06-17 DIAGNOSIS — K7460 Unspecified cirrhosis of liver: Secondary | ICD-10-CM | POA: Diagnosis not present

## 2021-06-17 DIAGNOSIS — I739 Peripheral vascular disease, unspecified: Secondary | ICD-10-CM | POA: Diagnosis not present

## 2021-06-17 DIAGNOSIS — G4733 Obstructive sleep apnea (adult) (pediatric): Secondary | ICD-10-CM | POA: Diagnosis not present

## 2021-06-17 DIAGNOSIS — K7682 Hepatic encephalopathy: Secondary | ICD-10-CM | POA: Diagnosis not present

## 2021-06-17 LAB — BASIC METABOLIC PANEL
BUN/Creatinine Ratio: 12 (calc) (ref 6–22)
BUN: 19 mg/dL (ref 7–25)
CO2: 36 mmol/L — ABNORMAL HIGH (ref 20–32)
Calcium: 8 mg/dL — ABNORMAL LOW (ref 8.6–10.3)
Chloride: 93 mmol/L — ABNORMAL LOW (ref 98–110)
Creat: 1.56 mg/dL — ABNORMAL HIGH (ref 0.70–1.28)
Glucose, Bld: 81 mg/dL (ref 65–99)
Potassium: 2.8 mmol/L — ABNORMAL LOW (ref 3.5–5.3)
Sodium: 137 mmol/L (ref 135–146)

## 2021-06-17 NOTE — Telephone Encounter (Signed)
Dr. Laural Golden told me on phone today that he would call patient.  ?

## 2021-06-17 NOTE — Telephone Encounter (Signed)
Call report from [facility] 06/15/2021 scan. I have reached out to Dr. Laural Golden via text for him to review the scan. ? ?IMPRESSION: ?1. Hyperenhancing 12 mm segment VII/VIII hepatic lesion LI-RADS 4, ?probable hepatocellular carcinoma. ?

## 2021-06-18 ENCOUNTER — Other Ambulatory Visit (INDEPENDENT_AMBULATORY_CARE_PROVIDER_SITE_OTHER): Payer: Self-pay | Admitting: *Deleted

## 2021-06-18 DIAGNOSIS — E876 Hypokalemia: Secondary | ICD-10-CM

## 2021-06-18 MED ORDER — POTASSIUM CHLORIDE CRYS ER 20 MEQ PO TBCR: EXTENDED_RELEASE_TABLET | ORAL | 0 refills | Status: DC

## 2021-06-22 DIAGNOSIS — E876 Hypokalemia: Secondary | ICD-10-CM | POA: Diagnosis not present

## 2021-06-23 ENCOUNTER — Other Ambulatory Visit (INDEPENDENT_AMBULATORY_CARE_PROVIDER_SITE_OTHER): Payer: Self-pay | Admitting: *Deleted

## 2021-06-23 ENCOUNTER — Telehealth (INDEPENDENT_AMBULATORY_CARE_PROVIDER_SITE_OTHER): Payer: Self-pay | Admitting: *Deleted

## 2021-06-23 DIAGNOSIS — E876 Hypokalemia: Secondary | ICD-10-CM

## 2021-06-23 LAB — BASIC METABOLIC PANEL
BUN/Creatinine Ratio: 16 (calc) (ref 6–22)
BUN: 26 mg/dL — ABNORMAL HIGH (ref 7–25)
CO2: 36 mmol/L — ABNORMAL HIGH (ref 20–32)
Calcium: 8.2 mg/dL — ABNORMAL LOW (ref 8.6–10.3)
Chloride: 91 mmol/L — ABNORMAL LOW (ref 98–110)
Creat: 1.59 mg/dL — ABNORMAL HIGH (ref 0.70–1.28)
Glucose, Bld: 87 mg/dL (ref 65–99)
Potassium: 2.8 mmol/L — ABNORMAL LOW (ref 3.5–5.3)
Sodium: 136 mmol/L (ref 135–146)

## 2021-06-23 NOTE — Telephone Encounter (Signed)
Called patient and discussed with him to stop fluid pills and to take potassium 38mq 2 today and then one daily and do bw in 2 days. Pt verbalized understanding. Orders left at front window for pickup.  ?

## 2021-06-23 NOTE — Telephone Encounter (Signed)
Dr. Laural Golden reviewed labs with me over the phone and wants patient to stop fluid pills and start on potassium 22mq - 2 today and then one daily. Needs bmp and magnessium on 3/9. Orders put in.  ?

## 2021-06-25 DIAGNOSIS — E876 Hypokalemia: Secondary | ICD-10-CM | POA: Diagnosis not present

## 2021-06-26 ENCOUNTER — Other Ambulatory Visit (INDEPENDENT_AMBULATORY_CARE_PROVIDER_SITE_OTHER): Payer: Self-pay | Admitting: Gastroenterology

## 2021-06-26 DIAGNOSIS — K7460 Unspecified cirrhosis of liver: Secondary | ICD-10-CM

## 2021-06-26 LAB — BASIC METABOLIC PANEL
BUN/Creatinine Ratio: 15 (calc) (ref 6–22)
BUN: 23 mg/dL (ref 7–25)
CO2: 37 mmol/L — ABNORMAL HIGH (ref 20–32)
Calcium: 8.2 mg/dL — ABNORMAL LOW (ref 8.6–10.3)
Chloride: 94 mmol/L — ABNORMAL LOW (ref 98–110)
Creat: 1.54 mg/dL — ABNORMAL HIGH (ref 0.70–1.28)
Glucose, Bld: 85 mg/dL (ref 65–99)
Potassium: 3.4 mmol/L — ABNORMAL LOW (ref 3.5–5.3)
Sodium: 136 mmol/L (ref 135–146)

## 2021-06-26 LAB — MAGNESIUM: Magnesium: 2 mg/dL (ref 1.5–2.5)

## 2021-06-26 MED ORDER — SPIRONOLACTONE 50 MG PO TABS: 50.0000 mg | ORAL_TABLET | Freq: Every day | ORAL | 0 refills | Status: DC

## 2021-06-26 NOTE — Telephone Encounter (Signed)
Lab results forwarded to Dr. Jenetta Downer ?

## 2021-06-26 NOTE — Telephone Encounter (Signed)
Spoke with the patient today regarding results of labs.  We will start him on spironolactone 50 mg every day and I advised him to take half a pill of Lasix (20 mg) every day as well.  We will stop metolazone indefinitely.  He will need a repeat BMP in 1 week. ?

## 2021-06-29 ENCOUNTER — Other Ambulatory Visit (INDEPENDENT_AMBULATORY_CARE_PROVIDER_SITE_OTHER): Payer: Self-pay | Admitting: *Deleted

## 2021-06-29 DIAGNOSIS — E876 Hypokalemia: Secondary | ICD-10-CM

## 2021-06-29 DIAGNOSIS — K7460 Unspecified cirrhosis of liver: Secondary | ICD-10-CM

## 2021-06-29 DIAGNOSIS — K7682 Hepatic encephalopathy: Secondary | ICD-10-CM

## 2021-06-29 DIAGNOSIS — I10 Essential (primary) hypertension: Secondary | ICD-10-CM | POA: Diagnosis not present

## 2021-06-29 DIAGNOSIS — Z6831 Body mass index (BMI) 31.0-31.9, adult: Secondary | ICD-10-CM | POA: Diagnosis not present

## 2021-06-29 DIAGNOSIS — E6609 Other obesity due to excess calories: Secondary | ICD-10-CM | POA: Diagnosis not present

## 2021-06-29 DIAGNOSIS — K7581 Nonalcoholic steatohepatitis (NASH): Secondary | ICD-10-CM | POA: Diagnosis not present

## 2021-06-29 DIAGNOSIS — A46 Erysipelas: Secondary | ICD-10-CM | POA: Diagnosis not present

## 2021-06-29 NOTE — Telephone Encounter (Signed)
Called to let pt know lab orders at front window for pickup and to do this Friday and his wife asked if he could have an appt today for rash on legs and leg swelling states he went to pcp this morning and still there to see if dr Hilma Favors could work him in. I let her know I would call back around lunch to get update if he was able to be seen.  ?

## 2021-06-29 NOTE — Telephone Encounter (Signed)
Patient states he did go see pcp for leg swelling and rash today and stasis dermatitis. Pcp wants to double lasix 2 -73m qam and at lunch for 2 weeks and prescribed amox- clauv  248mone bid for 10 days. Wear compression socks. Did labs today for thyroid. Dr. CaJenetta Downertopped metolazone and started him on spirolactone 5018maily. Pt states he does not know if dr golHilma Favorsnted him to continue or stop since he increased lasix.  ?

## 2021-06-30 ENCOUNTER — Ambulatory Visit (INDEPENDENT_AMBULATORY_CARE_PROVIDER_SITE_OTHER): Payer: Medicare Other | Admitting: Internal Medicine

## 2021-06-30 NOTE — Telephone Encounter (Signed)
Pt called back and states pcp does want him to continue spironolactone ?

## 2021-07-02 ENCOUNTER — Encounter (INDEPENDENT_AMBULATORY_CARE_PROVIDER_SITE_OTHER): Payer: Self-pay | Admitting: Internal Medicine

## 2021-07-02 ENCOUNTER — Other Ambulatory Visit: Payer: Self-pay

## 2021-07-02 ENCOUNTER — Ambulatory Visit (INDEPENDENT_AMBULATORY_CARE_PROVIDER_SITE_OTHER): Payer: Medicare Other | Admitting: Internal Medicine

## 2021-07-02 VITALS — BP 123/65 | HR 81 | Temp 98.0°F | Ht 67.0 in | Wt 236.2 lb

## 2021-07-02 DIAGNOSIS — R188 Other ascites: Secondary | ICD-10-CM | POA: Diagnosis not present

## 2021-07-02 DIAGNOSIS — K7460 Unspecified cirrhosis of liver: Secondary | ICD-10-CM

## 2021-07-02 DIAGNOSIS — K7581 Nonalcoholic steatohepatitis (NASH): Secondary | ICD-10-CM | POA: Diagnosis not present

## 2021-07-02 DIAGNOSIS — K7682 Hepatic encephalopathy: Secondary | ICD-10-CM

## 2021-07-02 MED ORDER — SPIRONOLACTONE 50 MG PO TABS: 100.0000 mg | ORAL_TABLET | Freq: Every day | ORAL | 1 refills | Status: DC

## 2021-07-02 MED ORDER — ALBUMIN HUMAN 25 % IV SOLN: 50.0000 g | Freq: Once | INTRAVENOUS | Status: DC

## 2021-07-02 NOTE — Progress Notes (Addendum)
Presenting complaint; ? ?Follow-up for decompensated liver disease. ?Patient now has developed new liver lesions. ? ?Database and subjective: ? ?Patient is accompanied by his wife today. ?Patient is 71 year old Caucasian male who was diagnosed with cirrhosis secondary to NASH about 7 years ago.  His condition remained well compensated until he developed COVID infection about 2 years ago after which she developed hepatic encephalopathy well controlled with medications.  He has had mildly elevated alpha-fetoprotein in the past but imaging studies including MR were negative for suspicious lesions.  He has been evaluated at [facility] but his MELD score has been low.  He was seen in the office on 05/12/2021.  He had lab studies.  His MELD score was 19.5.  Patient had become very sedentary and he was advised to go back to his usual physical DVT and try to lose the weight he had gained. ?Patient's daughter Cody Austin called office over 3 weeks ago stating that her diet and developed lower extremity edema.  Patient was started on furosemide 40 mg daily and KCl 20 mill equivalents daily.  We also recommended daily weight record and metabolic 7 a week later.  Patient was noted to be jaundiced when he saw his dermatologist last month.  Therefore I had him go to the lab for blood work on 06/10/2021.  His INR was 1.7.  Bilirubin had increased to 7.5 and transaminases are up as well.  AST was 177 and ALT was 91.  About 4 weeks earlier AST was 48 and ALT was 26. ?His daughter called back few days later that diuretic therapy was not working.  He was also begun on spironolactone and given metolazone to be used every fourth day and he began to have diuresis.  He had just had ultrasound in December 2022.  Therefore MR abdomen was obtained and reveals 2 new liver lesions one of the lesions measuring 12 mm or LI-RADS 4 concerning for HCC. ?I contacted Ms. Milas Hock, patient's transplant coordinator at [facility] and images were sent to  them for review and further recommendations.  Have not heard from them yet. ?With diuresis he developed hypokalemia and bump in his creatinine.  Therefore diuretic therapy was held and resumed at lower dose. ?Over the weekend his daughter sent me pictures of his lower extremities suggesting cellulitis and while I was traveling I recommended he should be seen by Dr. Hilma Favors.  He was seen by Dr. Hilma Favors.  He was begun on antibiotic.  Dr. Hilma Favors suggested he should take furosemide 80 mg daily.  He is also using stockings._0  ? ?Patient has been keeping up with his weight record.  This morning he weighed 235 pounds on his home scale.  He says his appetite is normal.  He is having 2 bowel movements per day.  He denies abdominal pain.  His abdomen is distended but he says distention has decreased since he was begun on diuretic therapy.  He denies melena or rectal bleeding.  He says he is getting on stationary bike but doing no more than few minutes every day. ?Patient's TSH was elevated and he was begun on levothyroxine by Dr. Hilma Favors earlier this week. ? ? ? ?Current Medications: ?Outpatient Encounter Medications as of 07/02/2021  ?Medication Sig  ? amoxicillin-clavulanate (AUGMENTIN) 875-125 MG tablet Take 1 tablet by mouth 2 (two) times daily.  ? atorvastatin (LIPITOR) 20 MG tablet Take 20 mg by mouth at bedtime.   ? furosemide (LASIX) 40 MG tablet Take 1 tablet (40 mg total) by  mouth in the morning.  ? lactulose (CHRONULAC) 10 GM/15ML solution Take 30 mLs (20 g total) by mouth 3 (three) times daily.  ? Levothyroxine Sodium 50 MCG CAPS Take by mouth daily before breakfast.  ? Omega-3 Fatty Acids (OMEGA 3 500 PO) Take 500 mg by mouth daily.   ? rifaximin (XIFAXAN) 550 MG TABS tablet Take 1 tablet (550 mg total) by mouth 2 (two) times daily.  ? zinc gluconate 50 MG tablet Take 50 mg by mouth at bedtime.   ? spironolactone (ALDACTONE) 50 MG tablet Take 1 tablet (50 mg total) by mouth daily. (Patient not taking: Reported on  07/02/2021)  ? ?No facility-administered encounter medications on file as of 07/02/2021.  ? ? ? ?Objective: ?Blood pressure 123/65, pulse 81, temperature 98 ?F (36.7 ?C), temperature source Oral, height 5' 7" (1.702 m), weight 236 lb 3.2 oz (107.1 kg). ?Patient is alert and in no acute distress. ?He does not have asterixis. ?Conjunctiva is pink. Sclera is nonicteric ?Oropharyngeal mucosa is normal. ?No neck masses or thyromegaly noted. ?Cardiac exam with regular rhythm normal S1 and S2. No murmur or gallop noted. ?Lungs are clear to auscultation. ?Abdomen is distended but not tense.  Both flanks are dull.  Liver edge is indistinct. ?He has 2+ pitting edema involving both his legs. ? ?Labs/studies Results: ? ? ?CBC Latest Ref Rng & Units 05/12/2021 01/06/2021 09/30/2020  ?WBC 3.8 - 10.8 Thousand/uL 3.6(L) 3.3(L) 3.6(L)  ?Hemoglobin 13.2 - 17.1 g/dL 11.2(L) 11.6(L) 12.3(L)  ?Hematocrit 38.5 - 50.0 % 33.1(L) 34.3(L) 37.2(L)  ?Platelets 140 - 400 Thousand/uL 55(L) 60(L) 69(L)  ?  ?CMP Latest Ref Rng & Units 06/25/2021 06/22/2021 06/17/2021  ?Glucose 65 - 99 mg/dL 85 87 81  ?BUN 7 - 25 mg/dL 23 26(H) 19  ?Creatinine 0.70 - 1.28 mg/dL 1.54(H) 1.59(H) 1.56(H)  ?Sodium 135 - 146 mmol/L 136 136 137  ?Potassium 3.5 - 5.3 mmol/L 3.4(L) 2.8(L) 2.8(L)  ?Chloride 98 - 110 mmol/L 94(L) 91(L) 93(L)  ?CO2 20 - 32 mmol/L 37(H) 36(H) 36(H)  ?Calcium 8.6 - 10.3 mg/dL 8.2(L) 8.2(L) 8.0(L)  ?Total Protein 6.1 - 8.1 g/dL - - -  ?Total Bilirubin 0.2 - 1.2 mg/dL - - -  ?Alkaline Phos 38 - 126 U/L - - -  ?AST 10 - 35 U/L - - -  ?ALT 9 - 46 U/L - - -  ?  ?Hepatic Function Latest Ref Rng & Units 06/10/2021 05/12/2021 01/06/2021  ?Total Protein 6.1 - 8.1 g/dL 5.1(L) 5.0(L) 4.8(L)  ?Albumin 3.5 - 5.0 g/dL - - -  ?AST 10 - 35 U/L 177(H) 48(H) 54(H)  ?ALT 9 - 46 U/L 91(H) 26 31  ?Alk Phosphatase 38 - 126 U/L - - -  ?Total Bilirubin 0.2 - 1.2 mg/dL 7.5(H) 5.0(H) 4.0(H)  ?Bilirubin, Direct 0.00 - 0.40 mg/dL - - -  ?  ?Lab data from 06/29/2021 ?Glucose 81, BUN 22,  creatinine 1.38 ?Serum sodium 134, potassium 3.8, chloride 93, CO2 30 ?TSH 4.690. ? ?Assessment: ? ?#1.  Fluid overload/ascites.  Ultrasound in December and was negative for ascites.  Unfortunately has hepatic function has deteriorated and reason for this must be poor progression of his liver disease and development of HCC.  He is responding to diuretic therapy.  He needs to go back on spironolactone along with furosemide.  Will need to monitor electrolytes and renal function closely.  If he does not respond to diuretic therapy may consider albumin infusion. ? ?#2.  New onset of ascites.  Will  arrange for diagnostic and possibly therapeutic abdominal paracenteses.  Studies will be ordered. ? ?#3.  New liver lesions concerning for hepatocellular carcinoma.  [facility] were [facility] to [facility].  Will reach out to them again tomorrow and see if they have any recommendations.  Patient may need to have these lesions ablated and eventually will need to be listed for liver transplant since hepatic function has deteriorated. ? ?#4.  NASH cirrhosis.  We will recalculate MELD score. ? ?#5.  Hepatic encephalopathy.  Patient appears to be at baseline. ? ? ?Plan: ? ?Continue furosemide at a dose of 80 mg p.o. every morning. ?Spironolactone 100 mg by mouth every morning. ?Patient will call office his weight dropped by more than 5 pounds in which case we will consider adjusting diuretic therapy. ?Ultrasound-guided abdominal paracentesis disease both for diagnostic and therapeutic purposes.  He will be given 50 g of albumin infusion at the time of paracentesis. ?He will go to the lab for CBC, INR and comprehensive chemistry panel on 07/06/2021.  We will recheck AFP along with this blood work. ?Office visit in 4 weeks. ? ? ? ? ? ?

## 2021-07-02 NOTE — Patient Instructions (Signed)
Blood work to be done on 07/06/2021. ?Physician will call with results when completed. ?Ultrasound-guided abdominal paracenteses to be arranged. ?

## 2021-07-02 NOTE — Telephone Encounter (Signed)
Patient is coming in today for office visit with dr Laural Golden at 3 ?

## 2021-07-02 NOTE — Telephone Encounter (Signed)
Per Dr. Laural Golden - Has patient heard back from [place]? Continue fluid pills as currently taking and hold on bw this Friday since had some with pcp and dr Laural Golden did review. Dr Laural Golden would like to see patient next week in office. ( Message sent to mitzie and she put patient down for today at 3pm.) left message to return call to discuss with pt.  ?

## 2021-07-06 ENCOUNTER — Encounter (HOSPITAL_COMMUNITY): Payer: Self-pay

## 2021-07-06 ENCOUNTER — Other Ambulatory Visit: Payer: Self-pay

## 2021-07-06 ENCOUNTER — Ambulatory Visit (HOSPITAL_COMMUNITY)
Admission: RE | Admit: 2021-07-06 | Discharge: 2021-07-06 | Disposition: A | Discharge status: Home / Self Care | Payer: Medicare Other | Source: Ambulatory Visit | Attending: Internal Medicine | Admitting: Internal Medicine

## 2021-07-06 DIAGNOSIS — K7460 Unspecified cirrhosis of liver: Secondary | ICD-10-CM | POA: Diagnosis not present

## 2021-07-06 DIAGNOSIS — R188 Other ascites: Secondary | ICD-10-CM | POA: Diagnosis not present

## 2021-07-06 DIAGNOSIS — K7581 Nonalcoholic steatohepatitis (NASH): Secondary | ICD-10-CM | POA: Diagnosis not present

## 2021-07-06 LAB — GRAM STAIN

## 2021-07-06 LAB — PROTEIN, PLEURAL OR PERITONEAL FLUID: Total protein, fluid: <3 g/dL

## 2021-07-06 LAB — BODY FLUID CELL COUNT WITH DIFFERENTIAL
Eos, Fluid: 0 %
Lymphs, Fluid: 61 %
Monocyte-Macrophage-Serous Fluid: 22 % — ABNORMAL LOW (ref 50–90)
Neutrophil Count, Fluid: 17 % (ref 0–25)
Total Nucleated Cell Count, Fluid: 251 cu mm (ref 0–1000)

## 2021-07-06 LAB — GLUCOSE, PLEURAL OR PERITONEAL FLUID: Glucose, Fluid: 94 mg/dL

## 2021-07-06 MED ORDER — ALBUMIN HUMAN 25 % IV SOLN: 50.0000 g | Freq: Once | INTRAVENOUS | Status: AC

## 2021-07-06 MED ORDER — SODIUM CHLORIDE FLUSH 0.9 % IV SOLN
INTRAVENOUS | Status: AC
  Administered 2021-07-06: 10 mL
  Filled 2021-07-06: qty 10

## 2021-07-06 MED ORDER — ALBUMIN HUMAN 25 % IV SOLN
INTRAVENOUS | Status: AC
  Administered 2021-07-06: 50 g via INTRAVENOUS
  Filled 2021-07-06: qty 200

## 2021-07-06 NOTE — Progress Notes (Signed)
PT tolerated right sided paracentesis and 50G of IV albumin well today and 3.7 Liters of clear amber colored fluid removed with labs collected and sent for processing. PT verbalized understanding of discharge instructions and ambulatory at departure with no acute distress noted.  ?

## 2021-07-07 ENCOUNTER — Telehealth (INDEPENDENT_AMBULATORY_CARE_PROVIDER_SITE_OTHER): Payer: Self-pay | Admitting: *Deleted

## 2021-07-07 ENCOUNTER — Other Ambulatory Visit (INDEPENDENT_AMBULATORY_CARE_PROVIDER_SITE_OTHER): Payer: Self-pay | Admitting: Internal Medicine

## 2021-07-07 LAB — COMPREHENSIVE METABOLIC PANEL
AG Ratio: 1.2 (calc) (ref 1.0–2.5)
ALT: 48 U/L — ABNORMAL HIGH (ref 9–46)
AST: 94 U/L — ABNORMAL HIGH (ref 10–35)
Albumin: 2.8 g/dL — ABNORMAL LOW (ref 3.6–5.1)
Alkaline phosphatase (APISO): 109 U/L (ref 35–144)
BUN/Creatinine Ratio: 14 (calc) (ref 6–22)
BUN: 24 mg/dL (ref 7–25)
CO2: 32 mmol/L (ref 20–32)
Calcium: 8.9 mg/dL (ref 8.6–10.3)
Chloride: 94 mmol/L — ABNORMAL LOW (ref 98–110)
Creat: 1.77 mg/dL — ABNORMAL HIGH (ref 0.70–1.28)
Globulin: 2.3 g/dL (calc) (ref 1.9–3.7)
Glucose, Bld: 80 mg/dL (ref 65–99)
Potassium: 3.6 mmol/L (ref 3.5–5.3)
Sodium: 135 mmol/L (ref 135–146)
Total Bilirubin: 6 mg/dL — ABNORMAL HIGH (ref 0.2–1.2)
Total Protein: 5.1 g/dL — ABNORMAL LOW (ref 6.1–8.1)

## 2021-07-07 LAB — CBC
HCT: 27.5 % — ABNORMAL LOW (ref 38.5–50.0)
Hemoglobin: 9.5 g/dL — ABNORMAL LOW (ref 13.2–17.1)
MCH: 36.1 pg — ABNORMAL HIGH (ref 27.0–33.0)
MCHC: 34.5 g/dL (ref 32.0–36.0)
MCV: 104.6 fL — ABNORMAL HIGH (ref 80.0–100.0)
MPV: 12.8 fL — ABNORMAL HIGH (ref 7.5–12.5)
Platelets: 38 10*3/uL — ABNORMAL LOW (ref 140–400)
RBC: 2.63 10*6/uL — ABNORMAL LOW (ref 4.20–5.80)
RDW: 14.8 % (ref 11.0–15.0)
WBC: 4 10*3/uL (ref 3.8–10.8)

## 2021-07-07 LAB — PATHOLOGIST SMEAR REVIEW

## 2021-07-07 LAB — AFP TUMOR MARKER: AFP-Tumor Marker: 14.6 ng/mL — ABNORMAL HIGH (ref <6.1)

## 2021-07-07 LAB — PROTIME-INR
INR: 1.9 — ABNORMAL HIGH
Prothrombin Time: 18.9 s — ABNORMAL HIGH (ref 9.0–11.5)

## 2021-07-07 NOTE — Telephone Encounter (Signed)
Per Dr. Laural Golden call patient and tell him Creatinine on bw was elevated and to stop both fluid pills furosemide and spironolactone. Patient verbalized understanding.  ?

## 2021-07-08 ENCOUNTER — Telehealth (INDEPENDENT_AMBULATORY_CARE_PROVIDER_SITE_OTHER): Payer: Self-pay | Admitting: Internal Medicine

## 2021-07-08 ENCOUNTER — Other Ambulatory Visit (INDEPENDENT_AMBULATORY_CARE_PROVIDER_SITE_OTHER): Payer: Self-pay | Admitting: *Deleted

## 2021-07-08 DIAGNOSIS — K7682 Hepatic encephalopathy: Secondary | ICD-10-CM

## 2021-07-08 DIAGNOSIS — K7460 Unspecified cirrhosis of liver: Secondary | ICD-10-CM

## 2021-07-08 NOTE — Telephone Encounter (Signed)
I called and talked with Cody Austin Network engineer for Mr. Cody Austin.  I brought her up-to-date on patient's condition.  His MELD score is down to 27 based on blood work from yesterday. ?She was able to see all of blood work.  She is hoping for the patient to be seen within the next few working days. ? ?Patient called and informed of the plan. ? ? ?

## 2021-07-09 LAB — ACID FAST SMEAR (AFB, MYCOBACTERIA): Acid Fast Smear: NEGATIVE

## 2021-07-10 DIAGNOSIS — N179 Acute kidney failure, unspecified: Secondary | ICD-10-CM | POA: Diagnosis not present

## 2021-07-10 DIAGNOSIS — I739 Peripheral vascular disease, unspecified: Secondary | ICD-10-CM | POA: Diagnosis present

## 2021-07-10 DIAGNOSIS — R188 Other ascites: Secondary | ICD-10-CM | POA: Diagnosis not present

## 2021-07-10 DIAGNOSIS — I350 Nonrheumatic aortic (valve) stenosis: Secondary | ICD-10-CM | POA: Diagnosis present

## 2021-07-10 DIAGNOSIS — K7682 Hepatic encephalopathy: Secondary | ICD-10-CM | POA: Diagnosis present

## 2021-07-10 DIAGNOSIS — K7210 Chronic hepatic failure without coma: Secondary | ICD-10-CM | POA: Diagnosis present

## 2021-07-10 DIAGNOSIS — Z20822 Contact with and (suspected) exposure to covid-19: Secondary | ICD-10-CM | POA: Diagnosis present

## 2021-07-10 DIAGNOSIS — J9811 Atelectasis: Secondary | ICD-10-CM | POA: Diagnosis not present

## 2021-07-10 DIAGNOSIS — I8500 Esophageal varices without bleeding: Secondary | ICD-10-CM | POA: Diagnosis present

## 2021-07-10 DIAGNOSIS — R109 Unspecified abdominal pain: Secondary | ICD-10-CM | POA: Diagnosis not present

## 2021-07-10 DIAGNOSIS — D689 Coagulation defect, unspecified: Secondary | ICD-10-CM | POA: Diagnosis present

## 2021-07-10 DIAGNOSIS — E785 Hyperlipidemia, unspecified: Secondary | ICD-10-CM | POA: Diagnosis present

## 2021-07-10 DIAGNOSIS — E871 Hypo-osmolality and hyponatremia: Secondary | ICD-10-CM | POA: Diagnosis present

## 2021-07-10 DIAGNOSIS — R932 Abnormal findings on diagnostic imaging of liver and biliary tract: Secondary | ICD-10-CM | POA: Diagnosis not present

## 2021-07-10 DIAGNOSIS — I480 Paroxysmal atrial fibrillation: Secondary | ICD-10-CM | POA: Diagnosis not present

## 2021-07-10 DIAGNOSIS — Z6833 Body mass index (BMI) 33.0-33.9, adult: Secondary | ICD-10-CM | POA: Diagnosis not present

## 2021-07-10 DIAGNOSIS — K7200 Acute and subacute hepatic failure without coma: Secondary | ICD-10-CM | POA: Diagnosis present

## 2021-07-10 DIAGNOSIS — Z6832 Body mass index (BMI) 32.0-32.9, adult: Secondary | ICD-10-CM | POA: Diagnosis not present

## 2021-07-10 DIAGNOSIS — K766 Portal hypertension: Secondary | ICD-10-CM | POA: Diagnosis not present

## 2021-07-10 DIAGNOSIS — F419 Anxiety disorder, unspecified: Secondary | ICD-10-CM | POA: Diagnosis present

## 2021-07-10 DIAGNOSIS — R944 Abnormal results of kidney function studies: Secondary | ICD-10-CM | POA: Diagnosis not present

## 2021-07-10 DIAGNOSIS — K7469 Other cirrhosis of liver: Secondary | ICD-10-CM | POA: Diagnosis not present

## 2021-07-10 DIAGNOSIS — K7460 Unspecified cirrhosis of liver: Secondary | ICD-10-CM | POA: Diagnosis not present

## 2021-07-10 DIAGNOSIS — D696 Thrombocytopenia, unspecified: Secondary | ICD-10-CM | POA: Diagnosis present

## 2021-07-10 DIAGNOSIS — I214 Non-ST elevation (NSTEMI) myocardial infarction: Secondary | ICD-10-CM | POA: Diagnosis present

## 2021-07-10 DIAGNOSIS — R778 Other specified abnormalities of plasma proteins: Secondary | ICD-10-CM | POA: Diagnosis not present

## 2021-07-10 DIAGNOSIS — I2510 Atherosclerotic heart disease of native coronary artery without angina pectoris: Secondary | ICD-10-CM | POA: Diagnosis not present

## 2021-07-10 DIAGNOSIS — S8992XA Unspecified injury of left lower leg, initial encounter: Secondary | ICD-10-CM | POA: Diagnosis not present

## 2021-07-10 DIAGNOSIS — I471 Supraventricular tachycardia: Secondary | ICD-10-CM | POA: Diagnosis present

## 2021-07-10 DIAGNOSIS — K7290 Hepatic failure, unspecified without coma: Secondary | ICD-10-CM | POA: Diagnosis not present

## 2021-07-10 DIAGNOSIS — Z6835 Body mass index (BMI) 35.0-35.9, adult: Secondary | ICD-10-CM | POA: Diagnosis not present

## 2021-07-10 DIAGNOSIS — D649 Anemia, unspecified: Secondary | ICD-10-CM | POA: Diagnosis present

## 2021-07-10 DIAGNOSIS — R161 Splenomegaly, not elsewhere classified: Secondary | ICD-10-CM | POA: Diagnosis not present

## 2021-07-10 DIAGNOSIS — G4733 Obstructive sleep apnea (adult) (pediatric): Secondary | ICD-10-CM | POA: Diagnosis present

## 2021-07-10 DIAGNOSIS — E8809 Other disorders of plasma-protein metabolism, not elsewhere classified: Secondary | ICD-10-CM | POA: Diagnosis present

## 2021-07-10 DIAGNOSIS — M1990 Unspecified osteoarthritis, unspecified site: Secondary | ICD-10-CM | POA: Diagnosis present

## 2021-07-10 DIAGNOSIS — K7581 Nonalcoholic steatohepatitis (NASH): Secondary | ICD-10-CM | POA: Diagnosis present

## 2021-07-11 DIAGNOSIS — I739 Peripheral vascular disease, unspecified: Secondary | ICD-10-CM | POA: Diagnosis present

## 2021-07-11 DIAGNOSIS — D689 Coagulation defect, unspecified: Secondary | ICD-10-CM | POA: Diagnosis present

## 2021-07-11 DIAGNOSIS — J9811 Atelectasis: Secondary | ICD-10-CM | POA: Diagnosis not present

## 2021-07-11 DIAGNOSIS — I8500 Esophageal varices without bleeding: Secondary | ICD-10-CM | POA: Diagnosis present

## 2021-07-11 DIAGNOSIS — E8809 Other disorders of plasma-protein metabolism, not elsewhere classified: Secondary | ICD-10-CM | POA: Diagnosis present

## 2021-07-11 DIAGNOSIS — D696 Thrombocytopenia, unspecified: Secondary | ICD-10-CM | POA: Diagnosis present

## 2021-07-11 DIAGNOSIS — K7460 Unspecified cirrhosis of liver: Secondary | ICD-10-CM | POA: Diagnosis present

## 2021-07-11 DIAGNOSIS — E785 Hyperlipidemia, unspecified: Secondary | ICD-10-CM | POA: Diagnosis present

## 2021-07-11 DIAGNOSIS — K7200 Acute and subacute hepatic failure without coma: Secondary | ICD-10-CM | POA: Diagnosis present

## 2021-07-11 DIAGNOSIS — G4733 Obstructive sleep apnea (adult) (pediatric): Secondary | ICD-10-CM | POA: Diagnosis present

## 2021-07-11 DIAGNOSIS — M1990 Unspecified osteoarthritis, unspecified site: Secondary | ICD-10-CM | POA: Diagnosis present

## 2021-07-11 DIAGNOSIS — R944 Abnormal results of kidney function studies: Secondary | ICD-10-CM | POA: Diagnosis not present

## 2021-07-11 DIAGNOSIS — K766 Portal hypertension: Secondary | ICD-10-CM | POA: Diagnosis not present

## 2021-07-11 DIAGNOSIS — Z6835 Body mass index (BMI) 35.0-35.9, adult: Secondary | ICD-10-CM | POA: Diagnosis not present

## 2021-07-11 DIAGNOSIS — R932 Abnormal findings on diagnostic imaging of liver and biliary tract: Secondary | ICD-10-CM | POA: Diagnosis not present

## 2021-07-11 DIAGNOSIS — K7290 Hepatic failure, unspecified without coma: Secondary | ICD-10-CM | POA: Diagnosis not present

## 2021-07-11 DIAGNOSIS — Z20822 Contact with and (suspected) exposure to covid-19: Secondary | ICD-10-CM | POA: Diagnosis present

## 2021-07-11 DIAGNOSIS — I471 Supraventricular tachycardia: Secondary | ICD-10-CM | POA: Diagnosis present

## 2021-07-11 DIAGNOSIS — I350 Nonrheumatic aortic (valve) stenosis: Secondary | ICD-10-CM | POA: Diagnosis present

## 2021-07-11 DIAGNOSIS — F419 Anxiety disorder, unspecified: Secondary | ICD-10-CM | POA: Diagnosis present

## 2021-07-11 DIAGNOSIS — K7581 Nonalcoholic steatohepatitis (NASH): Secondary | ICD-10-CM | POA: Diagnosis present

## 2021-07-11 DIAGNOSIS — K7210 Chronic hepatic failure without coma: Secondary | ICD-10-CM | POA: Diagnosis present

## 2021-07-11 DIAGNOSIS — R778 Other specified abnormalities of plasma proteins: Secondary | ICD-10-CM | POA: Diagnosis not present

## 2021-07-11 DIAGNOSIS — E871 Hypo-osmolality and hyponatremia: Secondary | ICD-10-CM | POA: Diagnosis present

## 2021-07-11 DIAGNOSIS — I480 Paroxysmal atrial fibrillation: Secondary | ICD-10-CM | POA: Diagnosis present

## 2021-07-11 DIAGNOSIS — R109 Unspecified abdominal pain: Secondary | ICD-10-CM | POA: Diagnosis not present

## 2021-07-11 DIAGNOSIS — K7682 Hepatic encephalopathy: Secondary | ICD-10-CM | POA: Diagnosis present

## 2021-07-11 DIAGNOSIS — N179 Acute kidney failure, unspecified: Secondary | ICD-10-CM | POA: Diagnosis present

## 2021-07-11 DIAGNOSIS — K7469 Other cirrhosis of liver: Secondary | ICD-10-CM | POA: Diagnosis not present

## 2021-07-11 DIAGNOSIS — I2510 Atherosclerotic heart disease of native coronary artery without angina pectoris: Secondary | ICD-10-CM | POA: Diagnosis present

## 2021-07-11 DIAGNOSIS — R161 Splenomegaly, not elsewhere classified: Secondary | ICD-10-CM | POA: Diagnosis not present

## 2021-07-11 DIAGNOSIS — D649 Anemia, unspecified: Secondary | ICD-10-CM | POA: Diagnosis present

## 2021-07-11 DIAGNOSIS — I214 Non-ST elevation (NSTEMI) myocardial infarction: Secondary | ICD-10-CM | POA: Diagnosis present

## 2021-07-11 DIAGNOSIS — R188 Other ascites: Secondary | ICD-10-CM | POA: Diagnosis present

## 2021-07-11 LAB — CULTURE, BODY FLUID W GRAM STAIN -BOTTLE
Culture: NO GROWTH
Special Requests: ADEQUATE

## 2021-07-16 ENCOUNTER — Emergency Department (HOSPITAL_COMMUNITY): Payer: Medicare Other

## 2021-07-16 ENCOUNTER — Encounter (HOSPITAL_COMMUNITY): Payer: Self-pay | Admitting: Emergency Medicine

## 2021-07-16 ENCOUNTER — Inpatient Hospital Stay (HOSPITAL_COMMUNITY)
Admission: EM | Admit: 2021-07-16 | Discharge: 2021-07-20 | DRG: 281 | Disposition: A | Discharge status: Home Health | Payer: Medicare Other | Attending: Internal Medicine | Admitting: Internal Medicine

## 2021-07-16 DIAGNOSIS — I209 Angina pectoris, unspecified: Secondary | ICD-10-CM | POA: Diagnosis not present

## 2021-07-16 DIAGNOSIS — E8770 Fluid overload, unspecified: Secondary | ICD-10-CM | POA: Diagnosis present

## 2021-07-16 DIAGNOSIS — I8510 Secondary esophageal varices without bleeding: Secondary | ICD-10-CM | POA: Diagnosis present

## 2021-07-16 DIAGNOSIS — N179 Acute kidney failure, unspecified: Secondary | ICD-10-CM

## 2021-07-16 DIAGNOSIS — Z8719 Personal history of other diseases of the digestive system: Secondary | ICD-10-CM | POA: Diagnosis not present

## 2021-07-16 DIAGNOSIS — I350 Nonrheumatic aortic (valve) stenosis: Secondary | ICD-10-CM | POA: Diagnosis present

## 2021-07-16 DIAGNOSIS — E039 Hypothyroidism, unspecified: Secondary | ICD-10-CM

## 2021-07-16 DIAGNOSIS — K7290 Hepatic failure, unspecified without coma: Secondary | ICD-10-CM | POA: Diagnosis not present

## 2021-07-16 DIAGNOSIS — R52 Pain, unspecified: Secondary | ICD-10-CM | POA: Diagnosis not present

## 2021-07-16 DIAGNOSIS — I2510 Atherosclerotic heart disease of native coronary artery without angina pectoris: Secondary | ICD-10-CM | POA: Diagnosis present

## 2021-07-16 DIAGNOSIS — I440 Atrioventricular block, first degree: Secondary | ICD-10-CM | POA: Diagnosis present

## 2021-07-16 DIAGNOSIS — I739 Peripheral vascular disease, unspecified: Secondary | ICD-10-CM | POA: Diagnosis present

## 2021-07-16 DIAGNOSIS — E669 Obesity, unspecified: Secondary | ICD-10-CM | POA: Diagnosis present

## 2021-07-16 DIAGNOSIS — R188 Other ascites: Secondary | ICD-10-CM | POA: Diagnosis not present

## 2021-07-16 DIAGNOSIS — I25118 Atherosclerotic heart disease of native coronary artery with other forms of angina pectoris: Secondary | ICD-10-CM

## 2021-07-16 DIAGNOSIS — D6859 Other primary thrombophilia: Secondary | ICD-10-CM | POA: Diagnosis present

## 2021-07-16 DIAGNOSIS — K7460 Unspecified cirrhosis of liver: Secondary | ICD-10-CM | POA: Diagnosis present

## 2021-07-16 DIAGNOSIS — G4733 Obstructive sleep apnea (adult) (pediatric): Secondary | ICD-10-CM | POA: Diagnosis present

## 2021-07-16 DIAGNOSIS — K7210 Chronic hepatic failure without coma: Secondary | ICD-10-CM

## 2021-07-16 DIAGNOSIS — I259 Chronic ischemic heart disease, unspecified: Secondary | ICD-10-CM

## 2021-07-16 DIAGNOSIS — K7581 Nonalcoholic steatohepatitis (NASH): Secondary | ICD-10-CM | POA: Diagnosis present

## 2021-07-16 DIAGNOSIS — D500 Iron deficiency anemia secondary to blood loss (chronic): Secondary | ICD-10-CM | POA: Diagnosis not present

## 2021-07-16 DIAGNOSIS — Z79890 Hormone replacement therapy (postmenopausal): Secondary | ICD-10-CM

## 2021-07-16 DIAGNOSIS — I959 Hypotension, unspecified: Secondary | ICD-10-CM | POA: Diagnosis present

## 2021-07-16 DIAGNOSIS — K7469 Other cirrhosis of liver: Secondary | ICD-10-CM | POA: Diagnosis not present

## 2021-07-16 DIAGNOSIS — K766 Portal hypertension: Secondary | ICD-10-CM | POA: Diagnosis present

## 2021-07-16 DIAGNOSIS — I214 Non-ST elevation (NSTEMI) myocardial infarction: Secondary | ICD-10-CM | POA: Diagnosis present

## 2021-07-16 DIAGNOSIS — D631 Anemia in chronic kidney disease: Secondary | ICD-10-CM | POA: Diagnosis present

## 2021-07-16 DIAGNOSIS — N1832 Chronic kidney disease, stage 3b: Secondary | ICD-10-CM | POA: Diagnosis present

## 2021-07-16 DIAGNOSIS — I222 Subsequent non-ST elevation (NSTEMI) myocardial infarction: Secondary | ICD-10-CM | POA: Diagnosis present

## 2021-07-16 DIAGNOSIS — Z6832 Body mass index (BMI) 32.0-32.9, adult: Secondary | ICD-10-CM

## 2021-07-16 DIAGNOSIS — E785 Hyperlipidemia, unspecified: Secondary | ICD-10-CM | POA: Diagnosis present

## 2021-07-16 DIAGNOSIS — K760 Fatty (change of) liver, not elsewhere classified: Secondary | ICD-10-CM | POA: Diagnosis not present

## 2021-07-16 DIAGNOSIS — R079 Chest pain, unspecified: Secondary | ICD-10-CM | POA: Diagnosis present

## 2021-07-16 DIAGNOSIS — C220 Liver cell carcinoma: Secondary | ICD-10-CM | POA: Diagnosis not present

## 2021-07-16 DIAGNOSIS — R601 Generalized edema: Secondary | ICD-10-CM | POA: Diagnosis not present

## 2021-07-16 DIAGNOSIS — Z7189 Other specified counseling: Secondary | ICD-10-CM

## 2021-07-16 DIAGNOSIS — Z515 Encounter for palliative care: Secondary | ICD-10-CM

## 2021-07-16 DIAGNOSIS — D649 Anemia, unspecified: Secondary | ICD-10-CM | POA: Diagnosis not present

## 2021-07-16 DIAGNOSIS — R0789 Other chest pain: Secondary | ICD-10-CM | POA: Diagnosis not present

## 2021-07-16 DIAGNOSIS — R9431 Abnormal electrocardiogram [ECG] [EKG]: Secondary | ICD-10-CM | POA: Diagnosis not present

## 2021-07-16 DIAGNOSIS — Z79899 Other long term (current) drug therapy: Secondary | ICD-10-CM

## 2021-07-16 DIAGNOSIS — K7682 Hepatic encephalopathy: Secondary | ICD-10-CM | POA: Diagnosis not present

## 2021-07-16 DIAGNOSIS — D696 Thrombocytopenia, unspecified: Secondary | ICD-10-CM | POA: Diagnosis not present

## 2021-07-16 DIAGNOSIS — I4510 Unspecified right bundle-branch block: Secondary | ICD-10-CM | POA: Diagnosis not present

## 2021-07-16 HISTORY — DX: Non-ST elevation (NSTEMI) myocardial infarction: I21.4

## 2021-07-16 HISTORY — DX: Atherosclerotic heart disease of native coronary artery without angina pectoris: I25.10

## 2021-07-16 LAB — CBC
HCT: 21.3 % — ABNORMAL LOW (ref 39.0–52.0)
Hemoglobin: 7.2 g/dL — ABNORMAL LOW (ref 13.0–17.0)
MCH: 36.9 pg — ABNORMAL HIGH (ref 26.0–34.0)
MCHC: 33.8 g/dL (ref 30.0–36.0)
MCV: 109.2 fL — ABNORMAL HIGH (ref 80.0–100.0)
Platelets: 53 10*3/uL — ABNORMAL LOW (ref 150–400)
RBC: 1.95 MIL/uL — ABNORMAL LOW (ref 4.22–5.81)
RDW: 18 % — ABNORMAL HIGH (ref 11.5–15.5)
WBC: 5.7 10*3/uL (ref 4.0–10.5)
nRBC: 0 % (ref 0.0–0.2)

## 2021-07-16 LAB — BASIC METABOLIC PANEL
Anion gap: 8 (ref 5–15)
BUN: 46 mg/dL — ABNORMAL HIGH (ref 8–23)
CO2: 27 mmol/L (ref 22–32)
Calcium: 8.6 mg/dL — ABNORMAL LOW (ref 8.9–10.3)
Chloride: 101 mmol/L (ref 98–111)
Creatinine, Ser: 1.69 mg/dL — ABNORMAL HIGH (ref 0.61–1.24)
GFR, Estimated: 43 mL/min — ABNORMAL LOW (ref >60)
Glucose, Bld: 104 mg/dL — ABNORMAL HIGH (ref 70–99)
Potassium: 4.4 mmol/L (ref 3.5–5.1)
Sodium: 136 mmol/L (ref 135–145)

## 2021-07-16 LAB — TROPONIN I (HIGH SENSITIVITY)
Troponin I (High Sensitivity): 1749 ng/L (ref <18)
Troponin I (High Sensitivity): 1865 ng/L (ref <18)

## 2021-07-16 LAB — VITAMIN B12: Vitamin B-12: 1811 pg/mL — ABNORMAL HIGH (ref 180–914)

## 2021-07-16 LAB — ABO/RH: ABO/RH(D): A POS

## 2021-07-16 LAB — PREPARE RBC (CROSSMATCH)

## 2021-07-16 LAB — IRON AND TIBC
Iron: 104 ug/dL (ref 45–182)
Saturation Ratios: 100 % — ABNORMAL HIGH (ref 17.9–39.5)
TIBC: 104 ug/dL — ABNORMAL LOW (ref 250–450)
UIBC: 0 ug/dL

## 2021-07-16 LAB — PROTIME-INR
INR: 2.7 — ABNORMAL HIGH (ref 0.8–1.2)
Prothrombin Time: 29 seconds — ABNORMAL HIGH (ref 11.4–15.2)

## 2021-07-16 LAB — FERRITIN: Ferritin: 189 ng/mL (ref 24–336)

## 2021-07-16 LAB — FOLATE: Folate: 9.6 ng/mL (ref >5.9)

## 2021-07-16 LAB — RETICULOCYTES
Immature Retic Fract: 27.9 % — ABNORMAL HIGH (ref 2.3–15.9)
RBC.: 1.96 MIL/uL — ABNORMAL LOW (ref 4.22–5.81)
Retic Count, Absolute: 101.5 10*3/uL (ref 19.0–186.0)
Retic Ct Pct: 5.2 % — ABNORMAL HIGH (ref 0.4–3.1)

## 2021-07-16 MED ORDER — NITROGLYCERIN 0.4 MG SL SUBL
0.4000 mg | SUBLINGUAL_TABLET | SUBLINGUAL | Status: DC | PRN
  Filled 2021-07-16: qty 1

## 2021-07-16 MED ORDER — SODIUM CHLORIDE 0.9 % IV SOLN
10.0000 mL/h | Freq: Once | INTRAVENOUS | Status: AC
  Administered 2021-07-16: 10 mL/h via INTRAVENOUS

## 2021-07-16 NOTE — H&P (Signed)
?History and Physical  ? ? ?Cody Austin DOB: 1950/12/12 DOA: 07/16/2021 ? ?PCP: [name], MD ? ?Patient coming from: Home ? ?Chief Complaint: Chest pain ? ?HPI: Cody Austin is a 71 y.o. male with medical history significant of NAFLD cirrhosis, OSA on CPAP, hyperlipidemia, hypothyroidism, PAD.  Recently admitted to [facility] for expedited transplant work-up.  He had evidence of ascites and underwent paracentesis on 3/28 with 2 L off. Also presented with creatinine 1.4, baseline 0.8-1.0.  Continued to be fluid overloaded despite albumin and diuresis with IV Lasix. During this admission he had chest pain and evidence of NSTEMI (ST depressions in V2-V5, troponin peaked at 15,349).  Cardiac catheterization done 3/27 revealed severe three-vessel disease and thus was no longer a candidate for transplant.  Also deemed not a good candidate for cardiac intervention given significant thrombocytopenia. Cardiology recommended outpatient cardiac rehab and follow-up with a local cardiologist. ? ?Patient presents to the ED today complaining of chest pain.  Vital signs stable.  Labs showing hemoglobin 7.2, MCV 109.2.  Platelet count 53k.  BUN 46, creatinine 1.6 (baseline 0.8-1.0). INR 2.7.  Vitamin B12 level 1811.  Folate normal.  Iron 104, TIBC 104, ferritin 189. High-sensitivity troponin 1749 > 1865.  Initial EKG showing sinus rhythm with first-degree AV block, T wave inversions in lead I and aVL, and ST depressions in V3-4. Chest x-ray showing mildly decreased lung volumes with mild bronchovascular crowding.  No acute lung process. Patient had recurrence of chest pain in the ED and repeat EKG showing new ST elevations in the inferior leads and worsening ST depressions/T wave inversions in lateral leads.  Cardiology consulted.  Patient was given 1 unit PRBCs. ? ?Patient states around 3 PM today he had a boost shake and soon after experienced tightness all across his chest which prompted family to call  EMS.  He did not experience any dyspnea, diaphoresis, or nausea at that time.  Chest pain had resolved by the time he arrived to the emergency room.  However, he did have recurrence of chest pain in the emergency room which he thinks happens every time he tries to lay back in the bed but improves if he is sitting up.  Patient states he was recently admitted to [facility] to be evaluated for liver transplant but had a heart attack during this hospitalization and was told he was no longer a candidate for liver transplant and also not a candidate for blood thinners or any further cardiac interventions due to his platelet count being very low.  Patient states palliative care was discussed and his daughter is currently trying to set up an outpatient appointment.  States the dose of his home Lasix has been adjusted several times in the past 2 weeks due to worsening kidney function.  Since after his recent hospitalization, he is taking Lasix 20 mg daily and no spironolactone.  Patient does mention that his stool was dark in color as few hours ago but he has not vomited any blood.  ? ?Review of Systems:  ?Review of Systems  ?All other systems reviewed and are negative. ? ?Past Medical History:  ?Diagnosis Date  ? AKI (acute kidney injury) ([place]) 07/17/2021  ? Arthritis   ? Cirrhosis, non-alcoholic ([place])   ? Coronary artery disease   ? Fatty liver   ? Heart murmur   ? History of kidney stones   ? Hypothyroidism 07/17/2021  ? NASH Liver cirrhosis secondary to NASH (nonalcoholic steatohepatitis) 06/01/2019  ? NSTEMI (  non-ST elevated myocardial infarction) ([place])   ? Other pancytopenia ([place]) 11/19/2014  ? Peripheral arterial disease ([place]) 05/28/2019  ? Sleep apnea   ? uses CPAP  ? ? ?Past Surgical History:  ?Procedure Laterality Date  ? CATARACT EXTRACTION W/PHACO Right 08/12/2014  ? Procedure: CATARACT EXTRACTION PHACO AND INTRAOCULAR LENS PLACEMENT (IOC);  Surgeon: [name], MD;  Location: [facility] ORS;  Service: Ophthalmology;   Laterality: Right;  CDE 9.32  ? CATARACT EXTRACTION W/PHACO Left 08/26/2014  ? Procedure: CATARACT EXTRACTION PHACO AND INTRAOCULAR LENS PLACEMENT (IOC);  Surgeon: [name], MD;  Location: [facility] ORS;  Service: Ophthalmology;  Laterality: Left;  CDE:  9.49  ? COLONOSCOPY N/A 12/13/2014  ? Procedure: COLONOSCOPY;  Surgeon: Rogene Houston, MD;  Location: [facility] END[facility] SUITE;  Service: Endoscopy;  Laterality: N/A;  100  ? ESOPHAGEAL BANDING N/A 08/31/2019  ? Procedure: ESOPHAGEAL BANDING;  Surgeon: Rogene Houston, MD;  Location: [facility] END[facility] SU[facility];  Service: Endoscopy;  Laterality: N/A;  ? ESOPHAGOGASTRODUODENOSCOPY N/A 12/13/2014  ? Procedure: ESOPHAGOGASTRODUODENOSCOPY (EGD);  Surgeon: Rogene Houston, MD;  Location: [facility];  Service: Endoscopy;  Laterality: N/A;  ? ESOPHAGOGASTRODUODENOSCOPY (EGD) WITH PROPOFOL N/A 08/31/2019  ? Procedure: ESOPHAGOGASTRODUODENOSCOPY (EGD) WITH PROPOFOL;  Surgeon: Rogene Houston, MD;  Location: [facility];  Service: Endoscopy;  Laterality: N/A;  1200  ? ESOPHAGOGASTRODUODENOSCOPY (EGD) WITH PROPOFOL N/A 10/01/2020  ? Procedure: ESOPHAGOGASTRODUODENOSCOPY (EGD) WITH PROPOFOL;  Surgeon: Rogene Houston, MD;  Location: [facility];  Service: Endoscopy;  Laterality: N/A;  10:00  ? HERNIA REPAIR Right   ? Inguinal hernia  ? TONSILLECTOMY    ? ? ? reports that he has never smoked. He has never been exposed to tobacco smoke. He has never used smokeless tobacco. He reports that he does not currently use alcohol. He reports that he does not use drugs. ? ?No Known Allergies ? ?Family History  ?Problem Relation Age of Onset  ? Cancer Father   ? Aneurysm Father   ? ? ?Prior to Admission medications   ?Medication Sig Start Date End Date Taking? Authorizing Provider  ?atorvastatin (LIPITOR) 20 MG tablet Take 20 mg by mouth at bedtime.  08/25/17  Yes [provider]  ?furosemide (LASIX) 20 MG tablet Take 20 mg by mouth daily.   Yes [provider]  ?lactulose (CHRONULAC) 10 GM/15ML  solution Take 30 mLs (20 g total) by mouth 3 (three) times daily. ?Patient taking differently: Take 10 g by mouth 3 (three) times daily. 05/12/21  Yes Rehman, Mechele Dawley, MD  ?Levothyroxine Sodium 50 MCG CAPS Take 50 mcg by mouth daily before breakfast.   Yes [provider]  ?Omega-3 Fatty Acids (OMEGA 3 500 PO) Take 500 mg by mouth at bedtime.   Yes [provider]  ?rifaximin (XIFAXAN) 550 MG TABS tablet Take 1 tablet (550 mg total) by mouth 2 (two) times daily. ?Patient taking differently: Take 550 mg by mouth 2 (two) times daily. Continuously 08/12/20  Yes Rehman, Mechele Dawley, MD  ?zinc gluconate 50 MG tablet Take 50 mg by mouth at bedtime.    Yes [provider]  ?amoxicillin-clavulanate (AUGMENTIN) 875-125 MG tablet Take 1 tablet by mouth 2 (two) times daily. ?Patient not taking: Reported on 07/16/2021 06/29/21   [provider]  ? ? ?Physical Exam: ?Vitals:  ? 07/17/21 0200 07/17/21 0230 07/17/21 0300 07/17/21 0400  ?BP: 114/67 (!) 113/53 (!) 114/57 (!) 110/55  ?Pulse: 81 80 78 77  ?Resp: 10 12 12 12   ?  Temp:      ?TempSrc:      ?SpO2: 100% 99% 99% 98%  ?Weight:      ?Height:      ? ? ?Physical Exam ?Vitals reviewed.  ?Constitutional:   ?   General: He is not in acute distress. ?HENT:  ?   Head: Normocephalic and atraumatic.  ?Eyes:  ?   Extraocular Movements: Extraocular movements intact.  ?   Conjunctiva/sclera: Conjunctivae normal.  ?Neck:  ?   Comments: +JVD ?Cardiovascular:  ?   Rate and Rhythm: Normal rate and regular rhythm.  ?   Pulses: Normal pulses.  ?   Heart sounds: Murmur heard.  ?Pulmonary:  ?   Effort: Pulmonary effort is normal. No respiratory distress.  ?   Breath sounds: No wheezing or rales.  ?Abdominal:  ?   General: Bowel sounds are normal. There is no distension.  ?   Palpations: Abdomen is soft.  ?   Tenderness: There is no abdominal tenderness.  ?Musculoskeletal:     ?   General: No tenderness.  ?   Cervical back: Normal range of motion and neck supple.  ?    Right lower leg: Edema present.  ?   Left lower leg: Edema present.  ?   Comments: +3 pitting edema of bilateral lower extremities  ?Skin: ?   General: Skin is warm and dry.  ?Neurological:  ?   General: No focal

## 2021-07-16 NOTE — ED Triage Notes (Signed)
Patient here with complaint of chest pain that started at approximately 1600 today, recently discharged after being admitted for NSTEMI. Pain is improved now, denies shortness of breath and nausea. Patient is alert, oriented, and in no apparent distress at this time. ?

## 2021-07-16 NOTE — Consult Note (Signed)
?Cardiology Consultation:  ? ?Patient ID: Cody Austin ?MRN: [id number]; DOB: 1951-01-06 ? ?Admit date: 07/16/2021 ?Date of Consult: 07/16/2021 ? ?PCP:  [name], MD ?  ?[facility] Providers ?Cardiologist:  None      ? ? ?Patient Profile:  ? ?Cody Austin is a 72 y.o. male with a hx of newly-dx 3V CAD, not candidate for surgical or percutaneous revascularization due to non-cardiac comorbidities who is being seen 07/16/2021 for the evaluation of CP and abnormal EKG/HS trop at the request of Dr. Marlowe Sax. ? ?History of Present Illness:  ? ?Cody Austin is a 71 y.o. male with medical history significant of NAFLD cirrhosis, OSA, hyperlipidemia, hypothyroidism.  Recently admitted to [facility] for expedited transplant work-up.  He had evidence of ascites and underwent paracentesis on 3/28 with 2 L off. Also presented with creatinine 1.4, baseline 0.8-1.0.  Continued to be fluid overloaded despite albumin and diuresis with IV Lasix. During this admission he had chest pain and evidence of NSTEMI (ST depressions in V2-V5, troponin peaked at 15,349).  Cardiac catheterization done 3/27 revealed severe three-vessel disease and thus was no longer a candidate for transplant.  Also deemed not a good candidate for cardiac intervention given significant thrombocytopenia. ?Pt says he was not having any significant anginal sx prior to the MI at [facility]. He was discharged yesterday and has had a few episodes of chest tightness since he left the hospital. He became concerned and came to the ED for further evaluation. ?Since arriving at the ED he had one recurrent episode of CP that lasted for several minutes; there were dynamic ST changes (mild ST elevation in III, o/w 62m diffuse ST depression) noted during that episode. His pain seems to be less significant when he sits upright. ? ? ?Past Medical History:  ?Diagnosis Date  ? Arthritis   ? Cirrhosis, non-alcoholic ([facility]   ? Fatty liver   ? Heart murmur   ? History of kidney  stones   ? NASH Liver cirrhosis secondary to NASH (nonalcoholic steatohepatitis) 06/01/2019  ? Other pancytopenia ([place] 11/19/2014  ? Peripheral arterial disease ([facility] 05/28/2019  ? Sleep apnea   ? uses CPAP  ? ? ?Past Surgical History:  ?Procedure Laterality Date  ? CATARACT EXTRACTION W/PHACO Right 08/12/2014  ? Procedure: CATARACT EXTRACTION PHACO AND INTRAOCULAR LENS PLACEMENT (IOC);  Surgeon: KTonny Branch MD;  Location: [facility] ORS;  Service: Ophthalmology;  Laterality: Right;  CDE 9.32  ? CATARACT EXTRACTION W/PHACO Left 08/26/2014  ? Procedure: CATARACT EXTRACTION PHACO AND INTRAOCULAR LENS PLACEMENT (IOC);  Surgeon: KTonny Branch MD;  Location: [facility] ORS;  Service: Ophthalmology;  Laterality: Left;  CDE:  9.49  ? COLONOSCOPY N/A 12/13/2014  ? Procedure: COLONOSCOPY;  Surgeon: NRogene Houston MD;  Location: [facility] ENDO SUITE;  Service: Endoscopy;  Laterality: N/A;  100  ? ESOPHAGEAL BANDING N/A 08/31/2019  ? Procedure: ESOPHAGEAL BANDING;  Surgeon: RRogene Houston MD;  Location: [facility] END[facility] SUITE;  Service: Endoscopy;  Laterality: N/A;  ? ESOPHAGOGASTRODUODENOSCOPY N/A 12/13/2014  ? Procedure: ESOPHAGOGASTRODUODENOSCOPY (EGD);  Surgeon: NRogene Houston MD;  Location: [facility];  Service: Endoscopy;  Laterality: N/A;  ? ESOPHAGOGASTRODUODENOSCOPY (EGD) WITH PROPOFOL N/A 08/31/2019  ? Procedure: ESOPHAGOGASTRODUODENOSCOPY (EGD) WITH PROPOFOL;  Surgeon: RRogene Houston MD;  Location: [facility] SU[facility];  Service: Endoscopy;  Laterality: N/A;  1200  ? ESOPHAGOGASTRODUODENOSCOPY (EGD) WITH PROPOFOL N/A 10/01/2020  ? Procedure: ESOPHAGOGASTRODUODENOSCOPY (EGD) WITH PROPOFOL;  Surgeon: RRogene Houston MD;  Location: [facility];  Service: Endoscopy;  Laterality: N/A;  10:00  ? HERNIA REPAIR Right   ? Inguinal hernia  ? TONSILLECTOMY    ?  ? ?Home Medications:  ?Prior to Admission medications   ?Medication Sig Start Date End Date Taking? Authorizing Provider  ?atorvastatin (LIPITOR) 20 MG tablet Take 20 mg by mouth at bedtime.  08/25/17  Yes  [provider]  ?furosemide (LASIX) 20 MG tablet Take 20 mg by mouth daily.   Yes [provider]  ?lactulose (CHRONULAC) 10 GM/15ML solution Take 30 mLs (20 g total) by mouth 3 (three) times daily. ?Patient taking differently: Take 10 g by mouth 3 (three) times daily. 05/12/21  Yes Rehman, Mechele Dawley, MD  ?Levothyroxine Sodium 50 MCG CAPS Take 50 mcg by mouth daily before breakfast.   Yes [provider]  ?Omega-3 Fatty Acids (OMEGA 3 500 PO) Take 500 mg by mouth at bedtime.   Yes [provider]  ?rifaximin (XIFAXAN) 550 MG TABS tablet Take 1 tablet (550 mg total) by mouth 2 (two) times daily. ?Patient taking differently: Take 550 mg by mouth 2 (two) times daily. Continuously 08/12/20  Yes Rehman, Mechele Dawley, MD  ?zinc gluconate 50 MG tablet Take 50 mg by mouth at bedtime.    Yes [provider]  ?amoxicillin-clavulanate (AUGMENTIN) 875-125 MG tablet Take 1 tablet by mouth 2 (two) times daily. ?Patient not taking: Reported on 07/16/2021 06/29/21   [provider]  ? ? ?Inpatient Medications: ?Scheduled Meds: ? ?Continuous Infusions: ? sodium chloride    ? albumin human    ? ?PRN Meds: ?nitroGLYCERIN ? ?Allergies:   No Known Allergies ? ?Social History:   ?Social History  ? ?Socioeconomic History  ? Marital status: Married  ?  Spouse name: Not on file  ? Number of children: Not on file  ? Years of education: Not on file  ? Highest education level: Not on file  ?Occupational History  ? Not on file  ?Tobacco Use  ? Smoking status: Never  ?  Passive exposure: Never  ? Smokeless tobacco: Never  ?Vaping Use  ? Vaping Use: Never used  ?Substance and Sexual Activity  ? Alcohol use: Not Currently  ? Drug use: No  ? Sexual activity: Yes  ?Other Topics Concern  ? Not on file  ?Social History Narrative  ? Not on file  ? ?Social Determinants of Health  ? ?Financial Resource Strain: Not on file  ?Food Insecurity: Not on file  ?Transportation Needs: Not on file  ?Physical Activity:  Not on file  ?Stress: Not on file  ?Social Connections: Not on file  ?Intimate Partner Violence: Not on file  ?  ?Family History:   ? ?Family History  ?Problem Relation Age of Onset  ? Cancer Father   ? Aneurysm Father   ?  ? ?ROS:  ?Please see the history of present illness.  ? ?All other ROS reviewed and negative.    ? ?Physical Exam/Data:  ? ?Vitals:  ? 07/16/21 2030 07/16/21 2200 07/16/21 2215 07/16/21 2232  ?BP: 99/76 133/70 (!) 113/56 (!) 102/52  ?Pulse: 95 95 97 94  ?Resp: 11 15 17 15   ?Temp:    98.2 ?F (36.8 ?C)  ?TempSrc:    Oral  ?SpO2: 99% 99% 99% 97%  ?Weight:      ?Height:      ? ?No intake or output data in the 24 hours ending 07/16/21 2233 ? ?  07/16/2021  ?  7:29 PM 07/02/2021  ?  3:10 PM 05/12/2021  ?  9:07 AM  ?Last 3 Weights  ?Weight (lbs) 208 lb 236 lb 3.2 oz 224 lb  ?Weight (kg) 94.348 kg 107.14 kg 101.606 kg  ?   ?Body mass index is 32.58 kg/m?.  ?General:  Well nourished, well developed, in no acute distress ?HEENT: normal ?Neck: no JVD ?Vascular: No carotid bruits ?Cardiac:  normal S1, S2; RRR; 2-3/6 sys murmur audible throughout the precordium and radiating to both carotids ?Lungs:  clear to auscultation bilaterally, no wheezing, rhonchi or rales  ?Abd: soft, nontender, no hepatomegaly  ?Ext: 1-2+ bilateral edema ?Musculoskeletal:  No deformities ?Skin: warm and dry  ?Neuro:  CNs 2-12 intact, no focal abnormalities noted ?Psych:  Normal affect  ? ?EKG:  The EKG was personally reviewed and demonstrates:  NSR with incomp RBBB, diffuse ST depressions ?Telemetry:  Telemetry was personally reviewed and demonstrates:  NSR ? ?Relevant CV Studies: ?[place] 07-13-21 ?CONCLUSIONS:  ?-Severe coronary artery disease involving the severely diffused right  ?coronary artery, subtotaled circumflex marginal system and large ramus  ?-Mild diffuse plaque involving the LAD and diagonals  ?-Likely at least moderate aortic stenosis with 21 mm mean gradient across  ?the aortic valve  ?-Normal left ventricular  end-diastolic pressure  ?TTE 07-12-21 ?1. The aortic valve is probably trileaflet with moderately thickened  ?leaflets with moderately reduced excursion.  ?  2. There is moderate aortic valve stenosis.  ?[facility]

## 2021-07-16 NOTE — ED Provider Notes (Signed)
?[place] ?Provider Note ? ? ?CSN: [id number] ?Arrival date & time: 07/16/21  1629 ? ?  ? ?History ? ?Chief Complaint  ?Patient presents with  ? Chest Pain  ? ? ?Cody Austin is a 71 y.o. male. ? ?HPI ? ?  ?71 year old male with history of [name] cirrhosis, peripheral arterial disease, OSA, hypothyroidism, who recently had presented to [facility] for expedited transplant work-up, and unfortunately had an NSTEMI where he was found to have three-vessel disease and not a candidate for transplant at [facility], sleep apnea, presenting concern for type chest pain. ? ? ? ?Shortly after his admission to [facility] he had ST depressions and a troponin of 15,000, at the time no intervention was done given his renal function and thrombocytopenia, cath showed severe three-vessel disease, not a good candidate for medical management given thrombocytopenia, with cardiology recommending outpatient cardiac rehab and to follow-up with local cardiologist.  That also discussed his decompensated cirrhosis, was diuresed ? ? ?Today premier protein shake and after that began to feel tightness in the chest, continued on seemed like a while, but it was likely 5 minutes, ECG completed by EMS looked ok but didn't have blood work, and that EMS crew would bring him to [facility]. Last Friday was the first time he learned he had a heart problem, they had recommended cardiology follow up. Wife sees Dr. Gwenlyn Found  ?Pain worse laying down, like in ED came back when reclined but improved sitting forward. Pain started when laying back too.   ? ?Lactulose working too well-was up all night having BM,  ? ?CP was better after sitting up in chair ?Not feeling short of breath really, more tightness in chest ?No nausea, no vomiting ?No bloody stool, was dark but not more dark than previously  ?2L paracentesis while in hospital ? ?Hgb 8.5 yesterday ? ?Past Medical History:  ?Diagnosis Date  ? Arthritis   ? Cirrhosis, non-alcoholic  ([place])   ? Fatty liver   ? Heart murmur   ? History of kidney stones   ? NASH Liver cirrhosis secondary to NASH (nonalcoholic steatohepatitis) 06/01/2019  ? Other pancytopenia ([place]) 11/19/2014  ? Peripheral arterial disease ([place]) 05/28/2019  ? Sleep apnea   ? uses CPAP  ?  ?Home Medications ?Prior to Admission medications   ?Medication Sig Start Date End Date Taking? Authorizing Provider  ?atorvastatin (LIPITOR) 20 MG tablet Take 20 mg by mouth at bedtime.  08/25/17  Yes [provider]  ?furosemide (LASIX) 20 MG tablet Take 20 mg by mouth daily.   Yes [provider]  ?lactulose (CHRONULAC) 10 GM/15ML solution Take 30 mLs (20 g total) by mouth 3 (three) times daily. ?Patient taking differently: Take 10 g by mouth 3 (three) times daily. 05/12/21  Yes Rehman, Mechele Dawley, MD  ?Levothyroxine Sodium 50 MCG CAPS Take 50 mcg by mouth daily before breakfast.   Yes [provider]  ?Omega-3 Fatty Acids (OMEGA 3 500 PO) Take 500 mg by mouth at bedtime.   Yes [provider]  ?rifaximin (XIFAXAN) 550 MG TABS tablet Take 1 tablet (550 mg total) by mouth 2 (two) times daily. ?Patient taking differently: Take 550 mg by mouth 2 (two) times daily. Continuously 08/12/20  Yes Rehman, Mechele Dawley, MD  ?zinc gluconate 50 MG tablet Take 50 mg by mouth at bedtime.    Yes [provider]  ?amoxicillin-clavulanate (AUGMENTIN) 875-125 MG tablet Take 1 tablet by mouth 2 (two) times daily. ?Patient not  taking: Reported on 07/16/2021 06/29/21   [provider]  ?   ? ?Allergies    ?Patient has no known allergies.   ? ?Review of Systems   ?Review of Systems ?See above ? ?Physical Exam ?Updated Vital Signs ?BP (!) 123/54   Pulse 84   Temp 98.2 ?F (36.8 ?C) (Oral)   Resp 18   Ht 5' 7"  (1.702 m)   Wt 94.3 kg   SpO2 100%   BMI 32.58 kg/m?  ?Physical Exam ?Vitals and nursing note reviewed.  ?Constitutional:   ?   General: He is not in acute distress. ?   Appearance: He is well-developed. He is not  diaphoretic.  ?HENT:  ?   Head: Normocephalic and atraumatic.  ?Eyes:  ?   Conjunctiva/sclera: Conjunctivae normal.  ?Cardiovascular:  ?   Rate and Rhythm: Normal rate and regular rhythm.  ?   Heart sounds: Normal heart sounds. No murmur heard. ?  No friction rub. No gallop.  ?Pulmonary:  ?   Effort: Pulmonary effort is normal. No respiratory distress.  ?   Breath sounds: Normal breath sounds. No wheezing or rales.  ?Abdominal:  ?   General: There is no distension.  ?   Palpations: Abdomen is soft.  ?   Tenderness: There is no abdominal tenderness. There is no guarding.  ?Musculoskeletal:  ?   Cervical back: Normal range of motion.  ?   Right lower leg: Edema present.  ?   Left lower leg: Edema present.  ?Skin: ?   General: Skin is warm and dry.  ?Neurological:  ?   Mental Status: He is alert and oriented to person, place, and time.  ? ? ?ED Results / Procedures / Treatments   ?Labs ?(all labs ordered are listed, but only abnormal results are displayed) ?Labs Reviewed  ?BASIC METABOLIC PANEL - Abnormal; Notable for the following components:  ?    Result Value  ? Glucose, Bld 104 (*)   ? BUN 46 (*)   ? Creatinine, Ser 1.69 (*)   ? Calcium 8.6 (*)   ? GFR, Estimated 43 (*)   ? All other components within normal limits  ?CBC - Abnormal; Notable for the following components:  ? RBC 1.95 (*)   ? Hemoglobin 7.2 (*)   ? HCT 21.3 (*)   ? MCV 109.2 (*)   ? MCH 36.9 (*)   ? RDW 18.0 (*)   ? Platelets 53 (*)   ? All other components within normal limits  ?PROTIME-INR - Abnormal; Notable for the following components:  ? Prothrombin Time 29.0 (*)   ? INR 2.7 (*)   ? All other components within normal limits  ?VITAMIN B12 - Abnormal; Notable for the following components:  ? Vitamin B-12 1,811 (*)   ? All other components within normal limits  ?IRON AND TIBC - Abnormal; Notable for the following components:  ? TIBC 104 (*)   ? Saturation Ratios 100 (*)   ? All other components within normal limits  ?RETICULOCYTES - Abnormal;  Notable for the following components:  ? Retic Ct Pct 5.2 (*)   ? RBC. 1.96 (*)   ? Immature Retic Fract 27.9 (*)   ? All other components within normal limits  ?TROPONIN I (HIGH SENSITIVITY) - Abnormal; Notable for the following components:  ? Troponin I (High Sensitivity) 1,749 (*)   ? All other components within normal limits  ?TROPONIN I (HIGH SENSITIVITY) - Abnormal; Notable for the following components:  ?  Troponin I (High Sensitivity) 1,865 (*)   ? All other components within normal limits  ?FOLATE  ?FERRITIN  ?HEPATIC FUNCTION PANEL  ?HIV ANTIBODY (ROUTINE TESTING W REFLEX)  ?OCCULT BLOOD X 1 CARD TO LAB, STOOL  ?BASIC METABOLIC PANEL  ?CBC  ?TYPE AND SCREEN  ?PREPARE RBC (CROSSMATCH)  ?ABO/RH  ?TROPONIN I (HIGH SENSITIVITY)  ? ? ?EKG ?EKG Interpretation ? ?Date/Time:  Thursday July 16 2021 21:49:19 EDT ?Ventricular Rate:  94 ?PR Interval:  259 ?QRS Duration: 111 ?QT Interval:  324 ?QTC Calculation: 406 ?R Axis:   27 ?Text Interpretation: Sinus rhythm Prolonged PR interval Incomplete left bundle branch block Posterior infarct, acute (LCx) Anterior Q waves, possibly due to ILBBB Minimal ST elevation, inferior leads >>> Acute MI <<< New STE in lead III, depressions anteriorlaterally Confirmed by Gareth Morgan [phone]) on 07/17/2021 1:00:20 AM ? ?Radiology ?DG Chest 2 View ? ?Result Date: 07/16/2021 ?CLINICAL DATA:  Chest pain. EXAM: CHEST - 2 VIEW COMPARISON:  None. FINDINGS: Cardiac silhouette is mildly enlarged. Mediastinal contours are within normal limits with mild calcification within aortic arch. Mildly decreased lung volumes with bronchovascular crowding. No focal airspace opacity to indicate pneumonia. No pulmonary edema, pleural effusion, or pneumothorax. Mild-to-moderate multilevel degenerative disc changes of the thoracic spine. IMPRESSION: Mildly decreased lung volumes with mild bronchovascular crowding. No acute lung process. Electronically Signed   By: Yvonne Kendall M.D.   On: 07/16/2021 17:13    ? ?Procedures ?Marland KitchenCritical Care ?Performed by: Gareth Morgan, MD ?Authorized by: Gareth Morgan, MD  ? ?Critical care provider statement:  ?  Critical care time (minutes):  75 ?  Critical care was time spent personally

## 2021-07-16 NOTE — ED Notes (Signed)
Pt complaining of chest pain. EKG showing ST elevations. EDP at bedside ?

## 2021-07-16 NOTE — ED Provider Triage Note (Signed)
Emergency Medicine Provider Triage Evaluation Note ? ?Cody Austin , a 71 y.o. male  was evaluated in triage.  Pt complains of chest tightness starting about an hour ago. He was d/c yesterday after being admitted for liver cirrhosis and NSTEMI s/p heart cath.  ? ?Review of Systems  ?Positive: Chest tightness (improved since symptoms started) ?Negative: SOB, cough, nausea, vomiting ? ?Physical Exam  ?BP 107/65   Pulse 91   Temp 98 ?F (36.7 ?C) (Oral)   Resp 15   SpO2 100%  ?Gen:   Awake, no distress   ?Resp:  Normal effort  ?MSK:   Moves extremities without difficulty  ?Other:   ? ?Medical Decision Making  ?Medically screening exam initiated at 4:38 PM.  Appropriate orders placed.  Cody Austin was informed that the remainder of the evaluation will be completed by another provider, this initial triage assessment does not replace that evaluation, and the importance of remaining in the ED until their evaluation is complete. ? ? ?  ?Kateri Plummer, PA-C ?07/16/21 1638 ? ?

## 2021-07-16 NOTE — ED Notes (Signed)
Pt currently reporting 0/10 chest pain ?

## 2021-07-17 ENCOUNTER — Other Ambulatory Visit: Payer: Self-pay

## 2021-07-17 ENCOUNTER — Encounter (HOSPITAL_COMMUNITY): Payer: Self-pay | Admitting: Internal Medicine

## 2021-07-17 DIAGNOSIS — I214 Non-ST elevation (NSTEMI) myocardial infarction: Secondary | ICD-10-CM | POA: Insufficient documentation

## 2021-07-17 DIAGNOSIS — E039 Hypothyroidism, unspecified: Secondary | ICD-10-CM | POA: Diagnosis present

## 2021-07-17 DIAGNOSIS — K7290 Hepatic failure, unspecified without coma: Secondary | ICD-10-CM | POA: Diagnosis not present

## 2021-07-17 DIAGNOSIS — D6859 Other primary thrombophilia: Secondary | ICD-10-CM | POA: Diagnosis present

## 2021-07-17 DIAGNOSIS — N179 Acute kidney failure, unspecified: Secondary | ICD-10-CM

## 2021-07-17 DIAGNOSIS — D631 Anemia in chronic kidney disease: Secondary | ICD-10-CM | POA: Diagnosis present

## 2021-07-17 DIAGNOSIS — R9431 Abnormal electrocardiogram [ECG] [EKG]: Secondary | ICD-10-CM | POA: Insufficient documentation

## 2021-07-17 DIAGNOSIS — R601 Generalized edema: Secondary | ICD-10-CM | POA: Diagnosis not present

## 2021-07-17 DIAGNOSIS — I350 Nonrheumatic aortic (valve) stenosis: Secondary | ICD-10-CM | POA: Insufficient documentation

## 2021-07-17 DIAGNOSIS — Z7189 Other specified counseling: Secondary | ICD-10-CM | POA: Diagnosis not present

## 2021-07-17 DIAGNOSIS — E785 Hyperlipidemia, unspecified: Secondary | ICD-10-CM | POA: Diagnosis not present

## 2021-07-17 DIAGNOSIS — I222 Subsequent non-ST elevation (NSTEMI) myocardial infarction: Secondary | ICD-10-CM | POA: Diagnosis present

## 2021-07-17 DIAGNOSIS — D649 Anemia, unspecified: Secondary | ICD-10-CM

## 2021-07-17 DIAGNOSIS — Z8719 Personal history of other diseases of the digestive system: Secondary | ICD-10-CM | POA: Diagnosis not present

## 2021-07-17 DIAGNOSIS — G4733 Obstructive sleep apnea (adult) (pediatric): Secondary | ICD-10-CM | POA: Diagnosis present

## 2021-07-17 DIAGNOSIS — R188 Other ascites: Secondary | ICD-10-CM | POA: Diagnosis present

## 2021-07-17 DIAGNOSIS — E669 Obesity, unspecified: Secondary | ICD-10-CM | POA: Diagnosis present

## 2021-07-17 DIAGNOSIS — K766 Portal hypertension: Secondary | ICD-10-CM | POA: Diagnosis present

## 2021-07-17 DIAGNOSIS — I739 Peripheral vascular disease, unspecified: Secondary | ICD-10-CM | POA: Diagnosis present

## 2021-07-17 DIAGNOSIS — I8510 Secondary esophageal varices without bleeding: Secondary | ICD-10-CM | POA: Diagnosis present

## 2021-07-17 DIAGNOSIS — K7682 Hepatic encephalopathy: Secondary | ICD-10-CM | POA: Diagnosis not present

## 2021-07-17 DIAGNOSIS — K7460 Unspecified cirrhosis of liver: Secondary | ICD-10-CM | POA: Diagnosis present

## 2021-07-17 DIAGNOSIS — K7581 Nonalcoholic steatohepatitis (NASH): Secondary | ICD-10-CM | POA: Diagnosis present

## 2021-07-17 DIAGNOSIS — D500 Iron deficiency anemia secondary to blood loss (chronic): Secondary | ICD-10-CM | POA: Diagnosis not present

## 2021-07-17 DIAGNOSIS — K7210 Chronic hepatic failure without coma: Secondary | ICD-10-CM

## 2021-07-17 DIAGNOSIS — K7469 Other cirrhosis of liver: Secondary | ICD-10-CM | POA: Diagnosis not present

## 2021-07-17 DIAGNOSIS — I209 Angina pectoris, unspecified: Secondary | ICD-10-CM | POA: Diagnosis not present

## 2021-07-17 DIAGNOSIS — Z515 Encounter for palliative care: Secondary | ICD-10-CM | POA: Diagnosis not present

## 2021-07-17 DIAGNOSIS — D696 Thrombocytopenia, unspecified: Secondary | ICD-10-CM

## 2021-07-17 DIAGNOSIS — C220 Liver cell carcinoma: Secondary | ICD-10-CM | POA: Diagnosis present

## 2021-07-17 DIAGNOSIS — I2510 Atherosclerotic heart disease of native coronary artery without angina pectoris: Secondary | ICD-10-CM | POA: Diagnosis present

## 2021-07-17 DIAGNOSIS — K760 Fatty (change of) liver, not elsewhere classified: Secondary | ICD-10-CM | POA: Insufficient documentation

## 2021-07-17 DIAGNOSIS — N1832 Chronic kidney disease, stage 3b: Secondary | ICD-10-CM | POA: Diagnosis present

## 2021-07-17 DIAGNOSIS — I959 Hypotension, unspecified: Secondary | ICD-10-CM | POA: Diagnosis present

## 2021-07-17 DIAGNOSIS — I25118 Atherosclerotic heart disease of native coronary artery with other forms of angina pectoris: Secondary | ICD-10-CM | POA: Diagnosis not present

## 2021-07-17 DIAGNOSIS — R079 Chest pain, unspecified: Secondary | ICD-10-CM | POA: Diagnosis not present

## 2021-07-17 DIAGNOSIS — E8770 Fluid overload, unspecified: Secondary | ICD-10-CM | POA: Diagnosis present

## 2021-07-17 DIAGNOSIS — I440 Atrioventricular block, first degree: Secondary | ICD-10-CM | POA: Diagnosis present

## 2021-07-17 HISTORY — DX: Acute kidney failure, unspecified: N17.9

## 2021-07-17 HISTORY — DX: Hypothyroidism, unspecified: E03.9

## 2021-07-17 LAB — CBC
HCT: 20.8 % — ABNORMAL LOW (ref 39.0–52.0)
HCT: 20 % — ABNORMAL LOW (ref 39.0–52.0)
Hemoglobin: 7.2 g/dL — ABNORMAL LOW (ref 13.0–17.0)
Hemoglobin: 6.8 g/dL — CL (ref 13.0–17.0)
MCH: 36.5 pg — ABNORMAL HIGH (ref 26.0–34.0)
MCH: 36 pg — ABNORMAL HIGH (ref 26.0–34.0)
MCHC: 34.6 g/dL (ref 30.0–36.0)
MCHC: 34 g/dL (ref 30.0–36.0)
MCV: 105.6 fL — ABNORMAL HIGH (ref 80.0–100.0)
MCV: 105.8 fL — ABNORMAL HIGH (ref 80.0–100.0)
Platelets: 41 10*3/uL — ABNORMAL LOW (ref 150–400)
Platelets: 52 10*3/uL — ABNORMAL LOW (ref 150–400)
RBC: 1.97 MIL/uL — ABNORMAL LOW (ref 4.22–5.81)
RBC: 1.89 MIL/uL — ABNORMAL LOW (ref 4.22–5.81)
RDW: 18.3 % — ABNORMAL HIGH (ref 11.5–15.5)
RDW: 18.7 % — ABNORMAL HIGH (ref 11.5–15.5)
WBC: 4.4 10*3/uL (ref 4.0–10.5)
WBC: 5.5 10*3/uL (ref 4.0–10.5)
nRBC: 0 % (ref 0.0–0.2)
nRBC: 0 % (ref 0.0–0.2)

## 2021-07-17 LAB — TROPONIN I (HIGH SENSITIVITY): Troponin I (High Sensitivity): 1850 ng/L (ref <18)

## 2021-07-17 LAB — HEPATIC FUNCTION PANEL
ALT: 36 U/L (ref 0–44)
AST: 64 U/L — ABNORMAL HIGH (ref 15–41)
Albumin: 2 g/dL — ABNORMAL LOW (ref 3.5–5.0)
Alkaline Phosphatase: 61 U/L (ref 38–126)
Bilirubin, Direct: 1.8 mg/dL — ABNORMAL HIGH (ref 0.0–0.2)
Indirect Bilirubin: 4.7 mg/dL — ABNORMAL HIGH (ref 0.3–0.9)
Total Bilirubin: 6.5 mg/dL — ABNORMAL HIGH (ref 0.3–1.2)
Total Protein: 4.2 g/dL — ABNORMAL LOW (ref 6.5–8.1)

## 2021-07-17 LAB — URINALYSIS, ROUTINE W REFLEX MICROSCOPIC
Bilirubin Urine: NEGATIVE
Glucose, UA: NEGATIVE mg/dL
Hgb urine dipstick: NEGATIVE
Ketones, ur: NEGATIVE mg/dL
Leukocytes,Ua: NEGATIVE
Nitrite: NEGATIVE
Protein, ur: NEGATIVE mg/dL
Specific Gravity, Urine: 1.017 (ref 1.005–1.030)
pH: 5 (ref 5.0–8.0)

## 2021-07-17 LAB — BASIC METABOLIC PANEL
Anion gap: 5 (ref 5–15)
BUN: 51 mg/dL — ABNORMAL HIGH (ref 8–23)
CO2: 28 mmol/L (ref 22–32)
Calcium: 8.6 mg/dL — ABNORMAL LOW (ref 8.9–10.3)
Chloride: 99 mmol/L (ref 98–111)
Creatinine, Ser: 1.57 mg/dL — ABNORMAL HIGH (ref 0.61–1.24)
GFR, Estimated: 47 mL/min — ABNORMAL LOW (ref >60)
Glucose, Bld: 95 mg/dL (ref 70–99)
Potassium: 4.3 mmol/L (ref 3.5–5.1)
Sodium: 132 mmol/L — ABNORMAL LOW (ref 135–145)

## 2021-07-17 LAB — HEMOGLOBIN AND HEMATOCRIT, BLOOD
HCT: 21.9 % — ABNORMAL LOW (ref 39.0–52.0)
Hemoglobin: 7.4 g/dL — ABNORMAL LOW (ref 13.0–17.0)

## 2021-07-17 LAB — CREATININE, URINE, RANDOM: Creatinine, Urine: 126.18 mg/dL

## 2021-07-17 LAB — HIV ANTIBODY (ROUTINE TESTING W REFLEX): HIV Screen 4th Generation wRfx: NONREACTIVE

## 2021-07-17 LAB — SODIUM, URINE, RANDOM: Sodium, Ur: 19 mmol/L

## 2021-07-17 LAB — PREPARE RBC (CROSSMATCH)

## 2021-07-17 MED ORDER — SODIUM CHLORIDE 0.9% IV SOLUTION: Freq: Once | INTRAVENOUS | Status: AC

## 2021-07-17 MED ORDER — ISOSORBIDE MONONITRATE ER 30 MG PO TB24
30.0000 mg | ORAL_TABLET | Freq: Every day | ORAL | Status: DC
  Administered 2021-07-17 – 2021-07-20 (×2): 30 mg via ORAL
  Filled 2021-07-17 (×4): qty 1

## 2021-07-17 MED ORDER — LACTULOSE 10 GM/15ML PO SOLN
10.0000 g | Freq: Three times a day (TID) | ORAL | Status: DC
  Administered 2021-07-17 – 2021-07-19 (×8): 10 g via ORAL
  Filled 2021-07-17 (×4): qty 15
  Filled 2021-07-17 (×2): qty 30
  Filled 2021-07-17 (×2): qty 15

## 2021-07-17 MED ORDER — RIFAXIMIN 550 MG PO TABS
550.0000 mg | ORAL_TABLET | Freq: Two times a day (BID) | ORAL | Status: DC
  Administered 2021-07-17 – 2021-07-20 (×7): 550 mg via ORAL
  Filled 2021-07-17 (×9): qty 1

## 2021-07-17 MED ORDER — LEVOTHYROXINE SODIUM 50 MCG PO TABS
50.0000 ug | ORAL_TABLET | Freq: Every day | ORAL | Status: DC
  Administered 2021-07-17 – 2021-07-20 (×4): 50 ug via ORAL
  Filled 2021-07-17 (×5): qty 1

## 2021-07-17 MED ORDER — METOPROLOL TARTRATE 12.5 MG HALF TABLET
12.5000 mg | ORAL_TABLET | Freq: Two times a day (BID) | ORAL | Status: DC
  Administered 2021-07-17 – 2021-07-19 (×4): 12.5 mg via ORAL
  Filled 2021-07-17 (×7): qty 1

## 2021-07-17 MED ORDER — ATORVASTATIN CALCIUM 10 MG PO TABS
20.0000 mg | ORAL_TABLET | Freq: Every day | ORAL | Status: DC
  Administered 2021-07-17 – 2021-07-19 (×4): 20 mg via ORAL
  Filled 2021-07-17 (×4): qty 2

## 2021-07-17 NOTE — Assessment & Plan Note (Signed)
-  Continue nightly CPAP ?

## 2021-07-17 NOTE — Assessment & Plan Note (Addendum)
Patient with extremely poor prognosis however multiple discussions with providers, palliative care, cardiology and gastroenterology result and continued desire to get better.  It is unclear to me if patient understands the gravity of his combination of medical conditions. ? ?

## 2021-07-17 NOTE — Assessment & Plan Note (Addendum)
CAD ?Recently admitted for NSTEMI at [facility] and cardiac catheterization done 3/27 revealed severe three-vessel disease. ?Appreciate cardiology consult ?Not a candidate for CABG.   ?Not a candidate for PCI due to AKI, acute on chronic anemia, and thrombocytopenia.   ?Not a candidate for antiplatelets given thrombocytopenia ?High-sensitivity troponin remains elevated (1749 > 1865 > 1850).  ?Continue long-acting nitrate (Imdur 30 mg daily) ?It is unclear to me if patient understands significance of ongoing elevation of troponin and our inability to provide any disease modifying agents due to multiple comorbidities. ?Patient will see cardiology in consultation after discharge per their request. ?

## 2021-07-17 NOTE — Assessment & Plan Note (Addendum)
Patient has responded well to 1 unit PRBC ?H&H have responded appropriately and have been stable on repeat ?Blood pressure also improved. ? EGD done in June 2022 showing grade 1 esophageal varices and portal hypertensive gastropathy. ?GI was consulted who wrote a wonderful note delineating difficulties with more aggressive treatment. ?Continue octreotide for 48 hours ?Convert IV PPI to oral PPI on discharge ?Marland Kitchen ?

## 2021-07-17 NOTE — Progress Notes (Signed)
? ?Progress Note ? ?Patient Name: Cody Austin ?Date of Encounter: 07/17/2021 ? ?[facility] Cardiologist: None  ? ?Subjective  ? ?No current cardiac complaints.  Troponins are noted to be elevated and flat. ? ?Inpatient Medications  ?  ?Scheduled Meds: ? atorvastatin  20 mg Oral QHS  ? isosorbide mononitrate  30 mg Oral Daily  ? lactulose  10 g Oral TID  ? levothyroxine  50 mcg Oral QAC breakfast  ? metoprolol tartrate  12.5 mg Oral BID  ? rifaximin  550 mg Oral BID  ? ?Continuous Infusions: ? albumin human    ? ?PRN Meds: ?  ? ?Vital Signs  ?  ?Vitals:  ? 07/17/21 1045 07/17/21 1115 07/17/21 1130 07/17/21 1315  ?BP: (!) 110/54 (!) 118/58 115/60 (!) 107/47  ?Pulse: 85 87 84 66  ?Resp: 13 17 11 18   ?Temp:      ?TempSrc:      ?SpO2: 98% 99% 95% 100%  ?Weight:      ?Height:      ? ? ?Intake/Output Summary (Last 24 hours) at 07/17/2021 1346 ?Last data filed at 07/17/2021 0113 ?Gross per 24 hour  ?Intake 315 ml  ?Output --  ?Net 315 ml  ? ? ?  07/16/2021  ?  7:29 PM 07/02/2021  ?  3:10 PM 05/12/2021  ?  9:07 AM  ?Last 3 Weights  ?Weight (lbs) 208 lb 236 lb 3.2 oz 224 lb  ?Weight (kg) 94.348 kg 107.14 kg 101.606 kg  ?   ? ?Telemetry  ?  ?Normal sinus rhythm with premature beats.- Personally Reviewed ? ?ECG  ?  ?Admission EKG sinus rhythm with lead III ST elevation, and ischemic ST depression 1, aVL, V45 and 6.- Personally Reviewed ? ?Physical Exam  ?Obese obese, pale, and jaundiced. ?GEN: No acute distress.   ?Neck: No JVD ?Cardiac: RRR, 3/6 systolic murmur consistent with aortic stenosis., rubs, or gallops.  ?Respiratory: Clear to auscultation bilaterally. ?GI: Soft, nontender, non-distended  ?MS: No edema; No deformity. ?Neuro:  Nonfocal  ?Psych: Normal affect  ? ?Labs  ?  ?High Sensitivity Troponin:   ?Recent Labs  ?Lab 07/16/21 ?1645 07/16/21 ?1848 07/17/21 ?0216  ?TROPONINIHS 1,749* 1,865* 1,850*  ?   ?Chemistry ?Recent Labs  ?Lab 07/16/21 ?6789 07/17/21 ?0215  ?NA 136 132*  ?K 4.4 4.3  ?CL 101 99  ?CO2 27 28   ?GLUCOSE 104* 95  ?BUN 46* 51*  ?CREATININE 1.69* 1.57*  ?CALCIUM 8.6* 8.6*  ?PROT  --  4.2*  ?ALBUMIN  --  2.0*  ?AST  --  64*  ?ALT  --  36  ?ALKPHOS  --  61  ?BILITOT  --  6.5*  ?GFRNONAA 43* 47*  ?ANIONGAP 8 5  ?  ?Lipids No results for input(s): CHOL, TRIG, HDL, LABVLDL, LDLCALC, CHOLHDL in the last 168 hours.  ?Hematology ?Recent Labs  ?Lab 07/16/21 ?1645 07/16/21 ?1926 07/17/21 ?0215  ?WBC 5.7  --  4.4  ?RBC 1.95* 1.96* 1.97*  ?HGB 7.2*  --  7.2*  ?HCT 21.3*  --  20.8*  ?MCV 109.2*  --  105.6*  ?MCH 36.9*  --  36.5*  ?MCHC 33.8  --  34.6  ?RDW 18.0*  --  18.3*  ?PLT 53*  --  41*  ? ?Thyroid No results for input(s): TSH, FREET4 in the last 168 hours.  ?BNPNo results for input(s): BNP, PROBNP in the last 168 hours.  ?DDimer No results for input(s): DDIMER in the last 168 hours.  ? ?Radiology  ?  ?  DG Chest 2 View ? ?Result Date: 07/16/2021 ?CLINICAL DATA:  Chest pain. EXAM: CHEST - 2 VIEW COMPARISON:  None. FINDINGS: Cardiac silhouette is mildly enlarged. Mediastinal contours are within normal limits with mild calcification within aortic arch. Mildly decreased lung volumes with bronchovascular crowding. No focal airspace opacity to indicate pneumonia. No pulmonary edema, pleural effusion, or pneumothorax. Mild-to-moderate multilevel degenerative disc changes of the thoracic spine. IMPRESSION: Mildly decreased lung volumes with mild bronchovascular crowding. No acute lung process. Electronically Signed   By: Yvonne Kendall M.D.   On: 07/16/2021 17:13   ? ?Cardiac Studies  ? ?Recent echocardiogram 07/12/2021 at [facility]: ?Echo per care everywhere: ? 1. Left ventricular ejection fraction, by estimation, is 60 to 65%. The left ventricle has normal function. The left ventricle has no regional wall motion abnormalities. The left ventricular internal cavity size was mildly dilated. There is mild  ?concentric left ventricular hypertrophy. Left ventricular diastolic parameters are consistent with Grade II diastolic  dysfunction (pseudonormalization). Elevated left atrial pressure.  ? 2. Right ventricular systolic function is normal. The right ventricular size is normal. There is normal pulmonary artery systolic pressure.  ? 3. Left atrial size was severely dilated.  ? 4. Right atrial size was moderately dilated.  ? 5. No evidence of mitral valve regurgitation.  ? 6. AV is thickened, calcified with restricted motion. Peak and mean gradients through the valve are 37 and 20 mm Hg respectively AVA (VTI) is 1.96 cm2. Dimensionless index is 0.56 All consistent with mild AS . The aortic valve is tricuspid. Aortic valve  ? regurgitation is not visualized.  ? 7. The inferior vena cava is normal in size with greater than 50% respiratory variability, suggesting right atrial pressure of 3 mmHg.   ?Coronary angiography 07/13/2021: ?CONCLUSIONS: ?-Severe coronary artery disease involving the severely diffused right  ?coronary artery, subtotaled circumflex marginal system and large ramus ?-Mild diffuse plaque involving the LAD and diagonals ?-Likely at least moderate aortic stenosis with 21 mm mean gradient across  ?the aortic valve ?-Normal left ventricular end-diastolic pressure  ? ? ? ?Patient Profile  ?   ?71 y.o. male with NASH, thrombocytopenia, diffuse coronary disease, turndown for liver transplant due to coronary disease, not a candidate for CABG or intervention due to complications of [name] (thrombocytopenia) and currently on medical therapy alone despite recent non-ST elevation MI. ? ?Assessment & Plan  ?  ?Non-ST elevation myocardial infarction: Known severe underlying coronary disease.  Medical therapy to include acting nitrates, beta-blocker therapy, each as tolerated by blood pressure. ?CAD, native, severe: Because of thrombocytopenia, severe anemia, liver disease, patient is not a candidate for antiplatelet therapy, percutaneous intervention, or coronary bypass surgery. ?Severe anemia: Etiology is related to chronic disease.   Needs blood transfusion to hemoglobin greater than 8.  Had 1 melenic bowel movement after discharge from [facility]. ?Hepatic failure due to NASH: Agree with palliative care. ?Significant aortic stenosis: No clinical evidence of heart failure at this time. ? ? ?Very limited options.  Agree with palliative care to align goals of care. ? ?Aggressive lipid-lowering, long-acting nitrates as tolerated by blood pressure, low-dose beta-blocker therapy, and continued titration to control anginal symptoms if possible. ? ?For questions or updates, please contact [place] ?Please consult [email or web address] for contact info under  ? ?  ?   ?Signed, ?Sinclair Grooms, MD  ?07/17/2021, 1:46 PM   ? ?

## 2021-07-17 NOTE — Assessment & Plan Note (Addendum)
No longer a candidate for liver transplant given three-vessel CAD.  ?Patient underwent paracentesis this morning without any improvement in symptomatology. ?Continue rifaximin and lactulose ?Lasix and spironolactone can be restarted at home although patient will need creatinine checked within a couple of days after discharge. ?

## 2021-07-17 NOTE — Assessment & Plan Note (Addendum)
Chronic and likely due to end-stage liver disease. Platelet count 53k, stable since labs done at [facility] on 3/29. ?-No anticoagulation or antiplatelet agents ?-Monitor platelet count closely and monitor for signs of bleeding ?

## 2021-07-17 NOTE — Assessment & Plan Note (Addendum)
Creatinine seems to have stabilized around 1.5. ?

## 2021-07-17 NOTE — ED Notes (Signed)
Hemoglobin 6.8 rec'd from lab. ?

## 2021-07-17 NOTE — Discharge Planning (Signed)
[facility] [address] Clydene Laming, [place], [place], [place] [phone] ?RNCM spoke with pt at bedside regarding discharge planning for [place]. Offered pt medicare.gov list of home health agencies to choose from.  Pt chose [facility]  to render services. Colletta Maryland of [facility] notified. Patient made aware that [facility] will be in contact in 24-48 hours.   ? ?

## 2021-07-17 NOTE — Assessment & Plan Note (Signed)
Continue Synthroid °

## 2021-07-17 NOTE — Assessment & Plan Note (Signed)
-  Continue Lipitor °

## 2021-07-17 NOTE — Progress Notes (Signed)
?Progress note ? ?Cody Austin FKC:[id number] DOB: [birth year] DOA: 07/16/2021 ? ?PCP: [name], MD ? ?Patient coming from: Home ? ?Chief Complaint: Chest pain ? ?HPI: Cody Austin is a 71 y.o. male with medical history significant of NAFLD cirrhosis, OSA on CPAP, hyperlipidemia, hypothyroidism, PAD.  Recently admitted to [facility] for expedited transplant work-up.  He had evidence of ascites and underwent paracentesis on 3/28 with 2 L off. Also presented with creatinine 1.4, baseline 0.8-1.0.  Continued to be fluid overloaded despite albumin and diuresis with IV Lasix. During this admission he had chest pain and evidence of NSTEMI (ST depressions in V2-V5, troponin peaked at 15,349).  Cardiac catheterization done 3/27 revealed severe three-vessel disease and thus was no longer a candidate for transplant.  Also deemed not a good candidate for cardiac intervention given significant thrombocytopenia. Cardiology recommended outpatient cardiac rehab and follow-up with a local cardiologist. ? ?Patient presents to the ED today complaining of chest pain.  Vital signs stable.  Labs showing hemoglobin 7.2, MCV 109.2.  Platelet count 53k.  BUN 46, creatinine 1.6 (baseline 0.8-1.0). INR 2.7.  Vitamin B12 level 1811.  Folate normal.  Iron 104, TIBC 104, ferritin 189. High-sensitivity troponin 1749 > 1865.  Initial EKG showing sinus rhythm with first-degree AV block, T wave inversions in lead I and aVL, and ST depressions in V3-4. Chest x-ray showing mildly decreased lung volumes with mild bronchovascular crowding.  No acute lung process. Patient had recurrence of chest pain in the ED and repeat EKG showing new ST elevations in the inferior leads and worsening ST depressions/T wave inversions in lateral leads.  Cardiology consulted.  Patient was given 1 unit PRBCs. ? ?Patient states around 3 PM today he had a boost shake and soon after experienced tightness all across his chest which prompted family to call EMS.  He did  not experience any dyspnea, diaphoresis, or nausea at that time.  Chest pain had resolved by the time he arrived to the emergency room.  However, he did have recurrence of chest pain in the emergency room which he thinks happens every time he tries to lay back in the bed but improves if he is sitting up.  Patient states he was recently admitted to [facility] to be evaluated for liver transplant but had a heart attack during this hospitalization and was told he was no longer a candidate for liver transplant and also not a candidate for blood thinners or any further cardiac interventions due to his platelet count being very low.  Patient states palliative care was discussed and his daughter is currently trying to set up an outpatient appointment.  States the dose of his home Lasix has been adjusted several times in the past 2 weeks due to worsening kidney function.  Since after his recent hospitalization, he is taking Lasix 20 mg daily and no spironolactone.  Patient does mention that his stool was dark in color as few hours ago but he has not vomited any blood.  ? ?Subjective ?No chest pain since last night ? ? ?Physical Exam ?Vitals reviewed.  ?Constitutional:   ?   General: He is not in acute distress. ?HENT:  ?   Head: Normocephalic and atraumatic.  ?Eyes:  ?   Extraocular Movements: Extraocular movements intact.  ?   Conjunctiva/sclera: Conjunctivae normal.  ?Neck:  ?   Comments: +JVD ?Cardiovascular:  ?   Rate and Rhythm: Normal rate and regular rhythm.  ?   Pulses: Normal pulses.  ?  Heart sounds: Murmur heard.  ?Pulmonary:  ?   Effort: Pulmonary effort is normal. No respiratory distress.  ?   Breath sounds: No wheezing or rales.  ?Abdominal:  ?   General: Bowel sounds are normal. There is no distension.  ?   Palpations: Abdomen is soft.  ?   Tenderness: There is no abdominal tenderness.  ?Musculoskeletal:     ?   General: No tenderness.  ?   Cervical back: Normal range of motion and neck supple.  ?   Right  lower leg: Edema present.  ?   Left lower leg: Edema present.  ?   Comments: +3 pitting edema of bilateral lower extremities  ?Skin: ?   General: Skin is warm and dry.  ?Neurological:  ?   General: No focal deficit present.  ?   Mental Status: He is alert and oriented to person, place, and time.  ?  ? ?Labs on Admission: I have personally reviewed following labs and imaging studies ? ?CBC: ?Recent Labs  ?Lab 07/16/21 ?1749 07/17/21 ?0215  ?WBC 5.7 4.4  ?HGB 7.2* 7.2*  ?HCT 21.3* 20.8*  ?MCV 109.2* 105.6*  ?PLT 53* 41*  ? ?Basic Metabolic Panel: ?Recent Labs  ?Lab 07/16/21 ?4496 07/17/21 ?0215  ?NA 136 132*  ?K 4.4 4.3  ?CL 101 99  ?CO2 27 28  ?GLUCOSE 104* 95  ?BUN 46* 51*  ?CREATININE 1.69* 1.57*  ?CALCIUM 8.6* 8.6*  ? ?GFR: ?Estimated Creatinine Clearance: 47.9 mL/min (A) (by C-G formula based on SCr of 1.57 mg/dL (H)). ?Liver Function Tests: ?Recent Labs  ?Lab 07/17/21 ?0215  ?AST 64*  ?ALT 36  ?ALKPHOS 61  ?BILITOT 6.5*  ?PROT 4.2*  ?ALBUMIN 2.0*  ? ?No results for input(s): LIPASE, AMYLASE in the last 168 hours. ?No results for input(s): AMMONIA in the last 168 hours. ?Coagulation Profile: ?Recent Labs  ?Lab 07/16/21 ?1926  ?INR 2.7*  ? ?Cardiac Enzymes: ?No results for input(s): CKTOTAL, CKMB, CKMBINDEX, TROPONINI in the last 168 hours. ?BNP (last 3 results) ?No results for input(s): PROBNP in the last 8760 hours. ?HbA1C: ?No results for input(s): HGBA1C in the last 72 hours. ?CBG: ?No results for input(s): GLUCAP in the last 168 hours. ?Lipid Profile: ?No results for input(s): CHOL, HDL, LDLCALC, TRIG, CHOLHDL, LDLDIRECT in the last 72 hours. ?Thyroid Function Tests: ?No results for input(s): TSH, T4TOTAL, FREET4, T3FREE, THYROIDAB in the last 72 hours. ?Anemia Panel: ?Recent Labs  ?  07/16/21 ?1926  ?PRFFMBWG66 1,811*  ?FOLATE 9.6  ?FERRITIN 189  ?TIBC 104*  ?IRON 104  ?RETICCTPCT 5.2*  ? ?Urine analysis: ?   ?Component Value Date/Time  ? COLORURINE AMBER (A) 07/17/2021 0645  ? APPEARANCEUR CLEAR 07/17/2021  0645  ? LABSPEC 1.017 07/17/2021 0645  ? PHURINE 5.0 07/17/2021 0645  ? [place] NEGATIVE 07/17/2021 0645  ? [place] NEGATIVE 07/17/2021 0645  ? [place] NEGATIVE 07/17/2021 0645  ? [place] NEGATIVE 07/17/2021 0645  ? [place] NEGATIVE 07/17/2021 0645  ? NITRITE NEGATIVE 07/17/2021 0645  ? LEUKOCYTESUR NEGATIVE 07/17/2021 0645  ? ? ?Radiological Exams on Admission: I have personally reviewed images ?DG Chest 2 View ? ?Result Date: 07/16/2021 ?CLINICAL DATA:  Chest pain. EXAM: CHEST - 2 VIEW COMPARISON:  None. FINDINGS: Cardiac silhouette is mildly enlarged. Mediastinal contours are within normal limits with mild calcification within aortic arch. Mildly decreased lung volumes with bronchovascular crowding. No focal airspace opacity to indicate pneumonia. No pulmonary edema, pleural effusion, or pneumothorax. Mild-to-moderate multilevel degenerative disc changes of the thoracic spine. IMPRESSION:  Mildly decreased lung volumes with mild bronchovascular crowding. No acute lung process. Electronically Signed   By: Yvonne Kendall M.D.   On: 07/16/2021 17:13   ? ?Assessment/Plan ?Principal Problem: ?  Chest pain ?Active Problems: ?  Acute on chronic anemia ?  AKI (acute kidney injury) ([place]) ?  End stage liver disease ([place]) ?  Thrombocytopenia ([place]) ?  OSA (obstructive sleep apnea) ?  HLD (hyperlipidemia) ?  Hypothyroidism ?  Goals of care, counseling/discussion ?  ?* Chest pain ?CAD ?Recently admitted for NSTEMI at [facility] and cardiac catheterization done 3/27 revealed severe three-vessel disease. Not a candidate for CABG.  Not a candidate for PCI due to AKI, acute on chronic anemia, and thrombocytopenia.  High-sensitivity troponin elevated but stable (1749 > 1865 > 1850).  ?-Cardiology consulted ?-Started on long-acting nitrate (Imdur 30 mg daily) ?-Start low-dose beta-blocker ?-Ranexa may also be an option ultimately ?-No heparin or antiplatelet agents given significant thrombocytopenia ? ?Acute on chronic  anemia ?Hemoglobin 7.2, was 8.7 on 3/29.  Hemodynamically stable.  Anemia likely due to end-stage renal disease.  However, patient does endorse an episode of melena at home.  No abdominal pain or hematemesis.  EGD do

## 2021-07-17 NOTE — Consult Note (Signed)
? ?Palliative Care Consult Note  ?                                ?Date: 07/17/2021  ? ?Patient Name: Cody Austin  ?DOB: [birth year]  MRN: [id number]  Age / Sex: 71 y.o., male  ?PCP: Cody Sites, MD ?Referring Physician: Phillips Grout, MD ? ?Reason for Consultation: Establishing goals of care ? ?HPI/Patient Profile: 71 y.o. male  with past medical history of nonalcoholic fatty liver disease cirrhosis, OSA on CPAP, hyperlipidemia hypothyroidism and PAD.  He was admitted to [facility] on 07/10/2021 for expedited transplant work-up.  During this missed admission he had chest pain and evidence of non-STEMI.  Cardiac catheterization done 327 revealed severe three-vessel disease and thus he was no longer a candidate for transplant.  He presented to [facility] emergency department on 07/16/2021 with chest pain. Hemoglobin 7.2. ?Admitted to hospitalist service with chest pain and acute on chronic anemia.  ? ? ?Past Medical History:  ?Diagnosis Date  ? AKI (acute kidney injury) ([place]) 07/17/2021  ? Arthritis   ? Cirrhosis, non-alcoholic ([place])   ? Coronary artery disease   ? Fatty liver   ? Heart murmur   ? History of kidney stones   ? Hypothyroidism 07/17/2021  ? NASH Liver cirrhosis secondary to NASH (nonalcoholic steatohepatitis) 06/01/2019  ? NSTEMI (non-ST elevated myocardial infarction) ([place])   ? Other pancytopenia ([place]) 11/19/2014  ? Peripheral arterial disease ([place]) 05/28/2019  ? Sleep apnea   ? uses CPAP  ? ? ?Subjective:  ? ?This NP Cody Austin reviewed medical records, including labs and imaging.  ? ?I met with patient and his daughter Cody Austin at bedside in the ED.  Patient has no acute complaints at this time. ?  ?Palliative Care was introduced as specialized medical care for people and their families living with serious illness.  If focuses on providing relief from the symptoms and stress of a serious illness.  The goal is to improve quality of life for both the patient and  the family. Values and goals of care important to patient and family were attempted to be elicited. ? ?Created space and opportunity for patient  and family to explore thoughts and feelings regarding current medical situation ?  ?Natural trajectory and current clinical status were discussed. Questions and concerns addressed. Patient/family  encouraged to call with questions or concerns.   ? ?Patient/Family Understanding of Illness: ?Patient and daughter verbalized understanding that he has severe three-vessel disease and is not a candidate for cardiac intervention including CABG or PCI/stenting. They understand he cannot be treated with heparin or antiplatelet agents due to significant thrombocytopenia. They also understand he is longer a candidate for liver transplant due to significant cardiac disease, and that his liver disease is non-curable.  ? ?Life Review: ?Patient is a retired Art gallery manager.  He and his wife Cody Austin have been married for 51 years.  They have 3 children together.  Daughter Cody Austin, 1 son who lives in [place], and another son who lives in [place].  Cody Austin and Cody Austin live together in their house in [place].  Prior to admission, [name] was ambulatory and independent with ADLs.  Cody Austin has been struggling with severe anxiety and depression for the past 6 months, and is no longer able to prepare meals or drive. ? ?Goals: ?[name] tells me that his goal is to spend time with family, have good quality of life, and "continue  to be here "as long as possible. ? ?Today's Discussion: ?Hospice and Palliative Care services outpatient were explained and offered. Provided education on the difference between Palliative and Hospice care. Palliative care and hospice have similar goals of managing symptoms, promoting comfort, improving quality of life, and maintaining a person's dignity. However, palliative care may be offered during any phase of a serious illness, while hospice care  is usually offered when a person is expected to live for 6 months or less. Discussed that blood transfusions and physical therapy are not included in hospice services. For now, patient and family are interested in home health with PT. They are also agreeable to outpatient palliative services.  ? ?We did discuss code status. Encouraged patient to consider DNR/DNI status understanding evidenced based poor outcomes in similar hospitalized patients, as the cause of the arrest is likely associated with chronic/terminal disease rather than a reversible acute cardio-pulmonary event.  ?I explained that DNR/DNI does not change the medical plan and it only comes into effect after a person has arrested (died).  It is a protective measure to keep [place] from harming the patient in their last moments of life.  Patient understand  prognosis would be poor in the event of a resuscitation event, but is not ready for DNR/DNI at this time. He does however express that he would only want a time trial of mechanical ventilation.  ?  ?Primary Decision Maker: ?PATIENT ? ? ?Review of Systems  ?Cardiovascular:  Negative for chest pain.  ? ?Objective:  ? ?Primary Diagnoses: ?Present on Admission: ? Chest pain ? Thrombocytopenia ([place]) ? OSA (obstructive sleep apnea) ? HLD (hyperlipidemia) ? ? ?Physical Exam ?Vitals reviewed.  ?Constitutional:   ?   General: He is not in acute distress. ?   Appearance: He is ill-appearing.  ?Cardiovascular:  ?   Rate and Rhythm: Normal rate and regular rhythm.  ?Pulmonary:  ?   Effort: Pulmonary effort is normal.  ?Neurological:  ?   Mental Status: He is alert and oriented to person, place, and time.  ?Psychiatric:     ?   Behavior: Behavior normal.  ? ? ?Vital Signs:  ?BP (!) 97/52   Pulse 61   Temp 98.5 ?F (36.9 ?C) (Oral)   Resp 12   Ht _0  (1.702 m)   Wt 94.3 kg   SpO2 100%   BMI 32.58 kg/m?  ? ?Palliative Assessment/Data: PPS 50-60% ? ? ? ? ?Assessment & Plan:  ? ?SUMMARY OF RECOMMENDATIONS   ?Full  code with continued discussions ?Continue current interventions ?Patient/family are not ready for hospice ?Outpatient palliative at discharge - [facility] (Serious Illness Care Program) ?PMT will continue to follow and support ? ? ?Symptom Management:  ?Per primary team ? ?Prognosis:  ?< 6 months ? ?Discharge Planning:  To be determined, goal is home ? ? ?Thank you for allowing [place] to participate in the care of [employer] Everman ? ? ?MDM - [place] ? ? ?Signed by: ?Cody Confer, NP ?Palliative Medicine Team ? ?Team Phone # [phone] (Nights/Weekends) ? ? ? ? ? ? ? ? ? ? ? ? ? ? ? ?

## 2021-07-18 DIAGNOSIS — Z8719 Personal history of other diseases of the digestive system: Secondary | ICD-10-CM

## 2021-07-18 DIAGNOSIS — R601 Generalized edema: Secondary | ICD-10-CM

## 2021-07-18 DIAGNOSIS — D500 Iron deficiency anemia secondary to blood loss (chronic): Secondary | ICD-10-CM

## 2021-07-18 DIAGNOSIS — C220 Liver cell carcinoma: Secondary | ICD-10-CM

## 2021-07-18 DIAGNOSIS — R079 Chest pain, unspecified: Secondary | ICD-10-CM | POA: Diagnosis not present

## 2021-07-18 DIAGNOSIS — K7290 Hepatic failure, unspecified without coma: Secondary | ICD-10-CM

## 2021-07-18 DIAGNOSIS — K7210 Chronic hepatic failure without coma: Secondary | ICD-10-CM | POA: Diagnosis not present

## 2021-07-18 DIAGNOSIS — R188 Other ascites: Secondary | ICD-10-CM

## 2021-07-18 DIAGNOSIS — D649 Anemia, unspecified: Secondary | ICD-10-CM | POA: Diagnosis not present

## 2021-07-18 DIAGNOSIS — N179 Acute kidney failure, unspecified: Secondary | ICD-10-CM | POA: Diagnosis not present

## 2021-07-18 DIAGNOSIS — K7460 Unspecified cirrhosis of liver: Secondary | ICD-10-CM

## 2021-07-18 LAB — HEMOGLOBIN AND HEMATOCRIT, BLOOD
HCT: 25.5 % — ABNORMAL LOW (ref 39.0–52.0)
Hemoglobin: 8.8 g/dL — ABNORMAL LOW (ref 13.0–17.0)

## 2021-07-18 LAB — PROTIME-INR
INR: 2.5 — ABNORMAL HIGH (ref 0.8–1.2)
Prothrombin Time: 26.9 seconds — ABNORMAL HIGH (ref 11.4–15.2)

## 2021-07-18 LAB — OSMOLALITY: Osmolality: 297 mOsm/kg — ABNORMAL HIGH (ref 275–295)

## 2021-07-18 LAB — PREPARE RBC (CROSSMATCH)

## 2021-07-18 MED ORDER — SUCRALFATE 1 G PO TABS: 1.0000 g | ORAL_TABLET | Freq: Three times a day (TID) | ORAL | Status: DC

## 2021-07-18 MED ORDER — SODIUM CHLORIDE 0.9 % IV SOLN
50.0000 ug/h | INTRAVENOUS | Status: DC
  Administered 2021-07-18 – 2021-07-20 (×5): 50 ug/h via INTRAVENOUS
  Filled 2021-07-18 (×6): qty 1

## 2021-07-18 MED ORDER — VITAMIN K1 10 MG/ML IJ SOLN
10.0000 mg | Freq: Once | INTRAVENOUS | Status: AC
  Administered 2021-07-18: 10 mg via INTRAVENOUS
  Filled 2021-07-18: qty 1

## 2021-07-18 MED ORDER — SUCRALFATE 1 G PO TABS
1.0000 g | ORAL_TABLET | Freq: Two times a day (BID) | ORAL | Status: DC
  Administered 2021-07-18 – 2021-07-20 (×4): 1 g via ORAL
  Filled 2021-07-18 (×4): qty 1

## 2021-07-18 MED ORDER — OCTREOTIDE LOAD VIA INFUSION
50.0000 ug | Freq: Once | INTRAVENOUS | Status: AC
  Administered 2021-07-18: 50 ug via INTRAVENOUS
  Filled 2021-07-18: qty 25

## 2021-07-18 MED ORDER — SODIUM CHLORIDE 0.9% IV SOLUTION: Freq: Once | INTRAVENOUS | Status: AC

## 2021-07-18 MED ORDER — PANTOPRAZOLE SODIUM 40 MG IV SOLR
40.0000 mg | Freq: Two times a day (BID) | INTRAVENOUS | Status: DC
  Administered 2021-07-18 – 2021-07-20 (×4): 40 mg via INTRAVENOUS
  Filled 2021-07-18 (×5): qty 10

## 2021-07-18 NOTE — Progress Notes (Signed)
? ?Progress Note ? ?Patient Name: Cody Austin ?Date of Encounter: 07/18/2021 ? ?[facility] Cardiologist: None  ? ?Subjective  ? ?No complaints. No bloody stools.  No chest pain ?Ambulating around room. ? ?Inpatient Medications  ?  ?Scheduled Meds: ? atorvastatin  20 mg Oral QHS  ? isosorbide mononitrate  30 mg Oral Daily  ? lactulose  10 g Oral TID  ? levothyroxine  50 mcg Oral QAC breakfast  ? metoprolol tartrate  12.5 mg Oral BID  ? rifaximin  550 mg Oral BID  ? ?Continuous Infusions: ? ? ?PRN Meds: ?  ? ?Vital Signs  ?  ?Vitals:  ? 07/17/21 2035 07/17/21 2329 07/18/21 1610 07/18/21 0844  ?BP: (!) 106/57 (!) 101/49 (!) 96/48 (!) 95/50  ?Pulse: 66 62 (!) 56 60  ?Resp: 16 14 15    ?Temp: 98.4 ?F (36.9 ?C) 98.2 ?F (36.8 ?C) 98.3 ?F (36.8 ?C)   ?TempSrc: Oral Oral Oral   ?SpO2: 98% 96% 99%   ?Weight:      ?Height:      ? ? ?Intake/Output Summary (Last 24 hours) at 07/18/2021 1153 ?Last data filed at 07/17/2021 1900 ?Gross per 24 hour  ?Intake 315 ml  ?Output --  ?Net 315 ml  ? ? ?  07/16/2021  ?  7:29 PM 07/02/2021  ?  3:10 PM 05/12/2021  ?  9:07 AM  ?Last 3 Weights  ?Weight (lbs) 208 lb 236 lb 3.2 oz 224 lb  ?Weight (kg) 94.348 kg 107.14 kg 101.606 kg  ?   ? ?Telemetry  ?  ?SR to Sinus tachycardia rare 1st HB- Personally Reviewed ? ?Physical Exam  ? ?Gen: no distress, obese  ?Neck: No JVD ?Cardiac: No Rubs or Gallops, 3/6 SEM RRR, +1 pulses ?Respiratory: Clear to auscultation bilaterally, normal effort, normal  respiratory rate ?GI: Soft, nontender, non-distended  ?MS: non pitting  edema;  moves all extremities ?Integument: Skin feels warm; notable Jaundiced ?Neuro:  At time of evaluation, alert and oriented to person/place/time/situation ; no astereixis ?Psych: Normal affect, patient feels better ? ? ?Labs  ?  ?High Sensitivity Troponin:   ?Recent Labs  ?Lab 07/16/21 ?1645 07/16/21 ?1848 07/17/21 ?0216  ?TROPONINIHS 1,749* 1,865* 1,850*  ?   ?Chemistry ?Recent Labs  ?Lab 07/16/21 ?9604 07/17/21 ?0215  ?NA 136 132*   ?K 4.4 4.3  ?CL 101 99  ?CO2 27 28  ?GLUCOSE 104* 95  ?BUN 46* 51*  ?CREATININE 1.69* 1.57*  ?CALCIUM 8.6* 8.6*  ?PROT  --  4.2*  ?ALBUMIN  --  2.0*  ?AST  --  64*  ?ALT  --  36  ?ALKPHOS  --  61  ?BILITOT  --  6.5*  ?GFRNONAA 43* 47*  ?ANIONGAP 8 5  ?  ?Lipids No results for input(s): CHOL, TRIG, HDL, LABVLDL, LDLCALC, CHOLHDL in the last 168 hours.  ?Hematology ?Recent Labs  ?Lab 07/16/21 ?1645 07/16/21 ?1926 07/17/21 ?0215 07/17/21 ?1334 07/17/21 ?2213  ?WBC 5.7  --  4.4 5.5  --   ?RBC 1.95* 1.96* 1.97* 1.89*  --   ?HGB 7.2*  --  7.2* 6.8* 7.4*  ?HCT 21.3*  --  20.8* 20.0* 21.9*  ?MCV 109.2*  --  105.6* 105.8*  --   ?MCH 36.9*  --  36.5* 36.0*  --   ?MCHC 33.8  --  34.6 34.0  --   ?RDW 18.0*  --  18.3* 18.7*  --   ?PLT 53*  --  41* 52*  --   ? ?Thyroid  No results for input(s): TSH, FREET4 in the last 168 hours.  ?BNPNo results for input(s): BNP, PROBNP in the last 168 hours.  ?DDimer No results for input(s): DDIMER in the last 168 hours.  ? ?Radiology  ?  ?DG Chest 2 View ? ?Result Date: 07/16/2021 ?CLINICAL DATA:  Chest pain. EXAM: CHEST - 2 VIEW COMPARISON:  None. FINDINGS: Cardiac silhouette is mildly enlarged. Mediastinal contours are within normal limits with mild calcification within aortic arch. Mildly decreased lung volumes with bronchovascular crowding. No focal airspace opacity to indicate pneumonia. No pulmonary edema, pleural effusion, or pneumothorax. Mild-to-moderate multilevel degenerative disc changes of the thoracic spine. IMPRESSION: Mildly decreased lung volumes with mild bronchovascular crowding. No acute lung process. Electronically Signed   By: Yvonne Kendall M.D.   On: 07/16/2021 17:13   ? ?Cardiac Studies  ? ?Recent echocardiogram 07/12/2021 at [facility]: ?Echo per care everywhere: ? 1. Left ventricular ejection fraction, by estimation, is 60 to 65%. The left ventricle has normal function. The left ventricle has no regional wall motion abnormalities. The left ventricular internal  cavity size was mildly dilated. There is mild  ?concentric left ventricular hypertrophy. Left ventricular diastolic parameters are consistent with Grade II diastolic dysfunction (pseudonormalization). Elevated left atrial pressure.  ? 2. Right ventricular systolic function is normal. The right ventricular size is normal. There is normal pulmonary artery systolic pressure.  ? 3. Left atrial size was severely dilated.  ? 4. Right atrial size was moderately dilated.  ? 5. No evidence of mitral valve regurgitation.  ? 6. AV is thickened, calcified with restricted motion. Peak and mean gradients through the valve are 37 and 20 mm Hg respectively AVA (VTI) is 1.96 cm2. Dimensionless index is 0.56 All consistent with mild AS . The aortic valve is tricuspid. Aortic valve  ? regurgitation is not visualized.  ? 7. The inferior vena cava is normal in size with greater than 50% respiratory variability, suggesting right atrial pressure of 3 mmHg.   ?Coronary angiography 07/13/2021: ?CONCLUSIONS: ?-Severe coronary artery disease involving the severely diffused right  ?coronary artery, subtotaled circumflex marginal system and large ramus ?-Mild diffuse plaque involving the LAD and diagonals ?-Likely at least moderate aortic stenosis with 21 mm mean gradient across  ?the aortic valve ?-Normal left ventricular end-diastolic pressure  ? ? ? ?Patient Profile  ?   ?71 y.o. male with NASH, thrombocytopenia, diffuse coronary disease, turndown for liver transplant due to coronary disease, not a candidate for CABG or intervention due to complications of [name] (thrombocytopenia) and currently on medical therapy alone despite recent non-ST elevation MI. ? ?Assessment & Plan  ?  ?Non-ST elevation myocardial infarction:  ? 3VD from 10/22/21 LHC complicated by cirrhosis ? Not a candidate for PCI or surgery or DAPT ? Asymptomatic with Hgb goa above 7.4; if worsening sx would increase hgb goal to 8.0 ? Continue IMDUr 30, metoprolol 12.5 BID,  atorvastatin 20 (cirrhosis) can increase imdur as needed ?Severe anemia: No further bleeding; as above ?NASH cirrhosis : Agree with palliative care. ?Significant aortic stenosis: No clinical evidence of heart failure at this time., would not be a candidate for TAVR ? ?Seen by [facility]; currently full therapy.  Hospice would be reasonable but not in line with families wishes at this time ? ?Likely 07/19/21 sign off ? ? ?For questions or updates, please contact [place] ?Please consult [email or web address] for contact info under  ? ?  ?   ?Signed, ?Werner Lean, MD  ?07/18/2021, 11:53  AM   ? ?

## 2021-07-18 NOTE — Progress Notes (Signed)
?                                                                                                                                                     ?                                                   ?Palliative Medicine Progress Note  ? ?Patient Name: Cody Austin       Date: 07/18/2021 ?DOB: March 21, 1951  Age: 71 y.o. MRN#: [id number] ?Attending Physician: Oren Binet* ?Primary Care Physician: [name], MD ?Admit Date: 07/16/2021 ? ? ? ?HPI/Patient Profile: 71 y.o. male  with past medical history of nonalcoholic fatty liver disease cirrhosis, OSA on CPAP, hyperlipidemia hypothyroidism and PAD.  He was admitted to [facility] on 07/10/2021 for expedited transplant work-up.  During this missed admission he had chest pain and evidence of non-STEMI.  Cardiac catheterization done 327 revealed severe three-vessel disease and thus he was no longer a candidate for transplant.  He presented to [facility] emergency department on 07/16/2021 with chest pain. Hemoglobin 7.2. ?Admitted to hospitalist service with chest pain and acute on chronic anemia.  ? ?Subjective: ?Chart reviewed.  Note that hemoglobin has trended from 7.2 on admission to 6.8 after 1 unit to 7.4 after the second unit. ? ?I went to see patient at bedside.  He is alert and has no acute complaints.  His son Catalina Antigua is at bedside.  We spent time reviewing lab results, cardiology recommendations, and overall plan of care. Discussed that patient's prior endoscopy in June 2022 showed grade 1 esophageal varices and portal hypertensive gastropathy. Patient states he is willing to undergo another endoscopy or colonoscopy if needed in the setting of continued low hemoglobin without an obvious source of bleeding.  ? ?I gave Catalina Antigua the contact information for PMT and encouraged him to call if we can be of additional assistance. ? ? ?Objective: ? ?Physical Exam ?Vitals reviewed.  ?Constitutional:   ?   General: He is not in acute distress. ?   Appearance: He is  ill-appearing.  ?Pulmonary:  ?   Effort: Pulmonary effort is normal.  ?Neurological:  ?   Mental Status: He is alert and oriented to person, place, and time.  ?         ? ?Vital Signs: BP (!) 95/50   Pulse 60   Temp 98.3 ?F (36.8 ?C) (Oral)   Resp 15   Ht 5' 7"  (1.702 m)   Wt 94.3 kg   SpO2 99%   BMI 32.58 kg/m?  ?SpO2: SpO2: 99 % ?O2 Device: O2 Device: Room Air ?O2 Flow Rate:   ? ? ?LBM: Last BM  Date : 07/17/21 ? ?   ?Palliative Assessment/Data: 50-60% ? ? ? ? ?Palliative Medicine Assessment & Plan  ? ?Assessment: ?Principal Problem: ?  Chest pain ?Active Problems: ?  OSA (obstructive sleep apnea) ?  HLD (hyperlipidemia) ?  Acute on chronic anemia ?  Thrombocytopenia ([place]) ?  AKI (acute kidney injury) ([place]) ?  End stage liver disease ([place]) ?  Hypothyroidism ?  Goals of care, counseling/discussion ?  ? ?Recommendations/Plan: ?Full code  ?Continue to treat the treatable ?Patient/family understand that he is not a candidate for liver transplant, CABG, or PCI/stenting ?Outpatient palliative at discharge - [facility] (Serious Illness Care Program) ? ?PMT will continue to follow peripherally. Please call [phone] for urgent needs. ? ?Prognosis: ? Likely eligible for hospice, but patient/family are not ready for this ? ?Discharge Planning: ?Home with Palliative Services ? ? ?Care plan was discussed with Dr. Jamse Arn ? ?Thank you for allowing the Palliative Medicine Team to assist in the care of this patient. ? ? ?MDM - moderate ? ? ?Lavena Bullion, NP ? ? ?Please contact Palliative Medicine Team phone at [phone] for questions and concerns.  ?For individual providers, please see AMION. ? ? ? ? ? ?

## 2021-07-18 NOTE — Progress Notes (Signed)
?Progress Note ? ? ?PatientSOHAN Austin HRC:[id number] DOB: 09-19-1950 DOA: 07/16/2021     1 ?DOS: the patient was seen and examined on 07/18/2021 ?  ?Brief hospital course: ? ?71 year old with NASH, cirrhosis with portal hypertension, possible hepatocellular CA, three-vessel CAD which precludes liver transplant, OSA, PAD was admitted 07/17/2021 with chest pain.  Work-up revealed elevated at bedtime troponin at 1865 with some T wave inversions on EKG.  Patient was seen by cardiology who felt that there was no further intervention that was possible and recommended ongoing conservative management. ? ?Assessment and Plan: ?* Chest pain ?CAD ?Recently admitted for NSTEMI at [facility] and cardiac catheterization done 3/27 revealed severe three-vessel disease. ?Appreciate cardiology consult ?Not a candidate for CABG.   ?Not a candidate for PCI due to AKI, acute on chronic anemia, and thrombocytopenia.   ?Not a candidate for antiplatelets given thrombocytopenia ?High-sensitivity troponin remains elevated (1749 > 1865 > 1850).  ?Continue long-acting nitrate (Imdur 30 mg daily) ?It is unclear to me if patient understands significance of ongoing elevation of troponin and our inability to provide any disease modifying agents due to multiple comorbidities. ? ?Acute on chronic anemia ? EGD done in June 2022 showing grade 1 esophageal varices and portal hypertensive gastropathy. ?Patient possibly with melena at home ?Hemoglobin dropped to 6.8 yesterday despite 1 unit PRBC although it has rebounded to 7.4. ?Will provide another unit PRBC to transfuse to hemoglobin greater than 8 per cardiology recommendations ?GI was consulted who wrote a wonderful note delineating difficulties with more aggressive treatment. ?We will continue to support with IV fluids and blood transfusion as warranted. ?Continue octreotide and IV PPI. ?. ? ?End stage liver disease ([place]) ?No longer a candidate for liver transplant given three-vessel CAD.  ?Recently  underwent paracentesis on 3/28 with 2 L removed at [facility].     ?Hold Lasix and spironolactone given AKI ?Continue rifaximin and lactulose ? ?AKI (acute kidney injury) ([place]) ?BUN 46, creatinine 1.6 (baseline 0.8-1.0). ?-Hold home diuretics at this time and monitor renal function closely ?-Consider giving albumin if renal function does not improve ?-Urine sodium and creatinine  ?-UA ? ?Thrombocytopenia ([place]) ?Chronic and likely due to end-stage liver disease. Platelet count 53k, stable since labs done at [facility] on 3/29. ?-No anticoagulation or antiplatelet agents ?-Monitor platelet count closely and monitor for signs of bleeding ? ?Goals of care, counseling/discussion ?Patient with extremely poor prognosis however multiple discussions with providers, palliative care, cardiology and gastroenterology result and continued desire to get better.  It is unclear to me if patient understands the gravity of his combination of medical conditions. ? ? ?Hypothyroidism ?-Continue Synthroid ? ?HLD (hyperlipidemia) ?-Continue Lipitor ? ?OSA (obstructive sleep apnea) ?-Continue nightly CPAP ? ? ? ?Subjective:  ? ?Patient and son are very concerned about dropping in hemoglobin and hematocrit.  Patient denies any hematemesis or melena but did state he had 2 dark stools prior to admission.  They agree to blood transfusions to hemoglobin greater than 8 per cardiology recommendations.   ? ?Physical Exam: ?Vitals:  ? 07/18/21 0844 07/18/21 1226 07/18/21 1445 07/18/21 1500  ?BP: (!) 95/50 (!) 97/58 (!) 104/51 (!) 104/50  ?Pulse: 60 63    ?Resp:  16    ?Temp:  98.4 ?F (36.9 ?C) 98.1 ?F (36.7 ?C) 98.3 ?F (36.8 ?C)  ?TempSrc:  Oral Oral Oral  ?SpO2:  98% 95% 95%  ?Weight:      ?Height:      ? ?Physical Exam: ?Blood pressure (!) 104/50, pulse  63, temperature 98.3 ?F (36.8 ?C), temperature source Oral, resp. rate 16, height 5' 7"  (1.702 m), weight 94.3 kg, SpO2 95 %. ?Gen: Obese gentleman appearing tired lying in bed with attentive son at  bedside ?Eyes: sclera anicteric, conjuctiva mildly injected bilaterally ?CVS: S1-S2, regulary, no gallops ?Respiratory:  decreased air entry likely secondary to decreased inspiratory effort ?GI: Protuberant, firm but not hard. ?LE: 3+ edema ?Neuro: Somewhat lethargic however coherent, grossly nonfocal.  ?Psych: patient is logical and coherent, insight into disease processes are poor, mood and affect appropriate to situation. ? ?  ? ?Family Communication: Patient's son was at bedside throughout ? ?Disposition: ?Status is: Inpatient ?Remains inpatient appropriate because: Ongoing hypotension and drop in H&H with need for ongoing transfusion. ? Planned Discharge Destination:  TBD ? ? ? ?Time spent: 45 minutes ? ?Author: ?[name], MD ?07/18/2021 4:40 PM ? ?For on call review [email or web address].  ?

## 2021-07-18 NOTE — Consult Note (Signed)
? ? ? ? ? Consultation ? ?Referring Provider:   Dr. Jamse Arn ?Primary Care Physician:  [name], MD ?Primary Gastroenterologist:  Dr. Bernadene Person       ?Reason for Consultation:   Anemia with history of melena in patient with NASH cirrhosis and known varices ? ? Impression   ? ?Chest pain with known coronary artery disease severe three-vessel.  Not a surgical candidate. ?Cardiac catheterization 03/27 severe three-vessel disease not a candidate for CABG not a candidate for PCI due to to AKI, thrombocytopenia. ?High-sensitivity troponin elevated but stable. ?No antiplatelets or heparin due to thrombocytopenia. ?Cardiology following ? ?Acute on chronic anemia ?Admitting hemoglobin 7.2, 8.7 on 03/29 but has had decreased HGB since 03/20 at 9.5 with baseline 11-12 before that.  ?Patient has had 2 units PRBC, 1 on 03/30 without significant increase and another on 03/31 with HGB 6.8, final H/H 7.4.  ?CBC on 07/17/2021   ?WBC 5.5 HGB 7.4 MCV 105.8 Platelets 52 ?Anemia studies on 07/16/2021  ?Iron 104 Ferritin 189 B12 1,811 ?Normal iron. ?CKD ?Likely poor bone marrow response with liver disease ?Likely component of anemia of chronic disease ? ?Decompensated cirrhosis  ?HGB 7.4 Platelets 52 ?AST 64 ALT 36  ?Alkphos 61 TBili 6.5 ?GFR 47  ?INR 07/16/2021 2.7  ?MELD-Na score: 31- mortality 52 %. ? ?Hepatic encephalopathy  ?11/26/2020 Ammonia 53 ? ?Thrombocytopenia ?CBC on 07/17/2021   ? Platelets 52 ? ?Acute renal failure ?Urine sodium 19 ?-Avoid large volume paracentesis ?-Diuretics on hold ?-Avoid nephrotoxic drugs ? ?With hypercoagulability ?07/16/2021 INR 2.7 ? ? ? ? Plan  ? ?Decompensated cirrhosis with non-STEMI, nonoperable three-vessel severe coronary artery disease, with anemia not responding to packed red blood cells. ?No active bleeding at this time, will plan for conservative management unless patient shows signs of active bleeding or becomes transfusion dependent. ?-started on ocetotride and PPI IV ? ?Patient with  multiple comorbidities, non-STEMI this visit nonoperable known three-vessel severe disease. ?Hypercoagulability, thrombocytopenia, patient very high risk for endoscopic evaluation. ? ?Esophageal Varices: ?Episodes of melena prior to this visit, has not had any stools since being in the hospital but not responding appropriately to blood transfusions. ?last EGD 10/01/2020 with grade 1 varices not banded due to size. ? ?Ascites:  ?-will get diagnostic paracentesis ?-Please send for cell count with differential, , gram stain- if positive will start on ABX ?Max 2 L ?-monitor kidney function for possible restarting diuretics.  ? ? Hepatic Encephalopathy:  ?11/26/2020 Ammonia 53 ?-Continue home medications ?- Lactulose, 12m titrate to 3bms per day ?- Rifaximin 558mbid ?- minimize/remove all benzos and narcotics ? ?with Coagulopathy ?Check PT/INR daily ? ? ?Thank you for your kind consultation, we will continue to follow. ?       ? HPI:   ?Cody Austin a [age].o. male with past medical history significant for hypothyroidism, sleep apnea on CPAP, peripheral arterial disease, coronary artery disease, and NAFLD cirrhosis  pancytopenia, hepatic encephalopathy, ascites. ? ?10/01/2020 endoscopy ?- Normal hypopharynx. ?- Normal proximal esophagus. ?- Grade I esophageal varices. Varices were not large enough to be banded. ?- Z-line irregular, 42 cm from the incisors. ?- Portal hypertensive gastropathy. ?- Normal duodenal bulb and second portion of the duodenum. ?- No specimens collected. ? ?08/31/2019 Normal EGD ?- Normal hypopharynx. ?- Normal esophagus. ?- Z-line regular, 43 cm from the incisors. ?- Portal hypertensive gastropathy. ?- Normal cardia, antrum and pylorus. ?- Congested duodenal mucosa. ?- No specimens collected. ? ?12/13/2014 EGD and colonoscopy ? ?Patient is  on lactulose milliliters 3 times daily and rifaximin for history of hepatic encephalopathy. ? ?Patient was seen in the office at [facility] 07/10/2021 for  decompensation. ?At that time he was having recent findings of LR-for lesion concerning for hepatocellular carcinoma. An MRI was done on 06/15/21 that showed a LR-4 lesion in segment 7/8 and LR-3 lesion in segment 4, diminutive PV appears to be patent ?Underwent first paracentesis 07/02/2021 4.7 withdrawn with albumin. ?History of ascites status post paracentesis 07/14/2021 2 L removed. ? ?Patient sent to [facility] for admission for decompensation of cirrhosis.  While there he developed chest pain and cardiac catheterization 07/13/2021 revealed three-vessel severe disease and patient was no longer transplant candidate, and poor surgical candidate, recommended outpatient cardiac rehab... ? ?Patient presented to the 07/16/2021 with chest pain. ?Hemoglobin 7.2, MCV 109.2  ?INR elevated at 2.7, platelet count 53,000 ?Elevated BUN 46, creatinine 1.6 (baseline 0.8-1)-Home dose of Lasix recently adjusted last 2 weeks due to kidney function.  At home was only on Lasix 20 mg Tinospora lactone. ?Urine sodium 19 ?Iron 104, ferritin 189. ?In the ER patient had new ST elevations inferior leads and worsening ST depression/T wave inversions in lateral leads and cardiology was consulted. ? ?Patient states he has been having intermittent black loose stools since his stay at [facility].  Denies iron or Pepto-Bismol use. ?Denies reflux. ?Last episode was yesterday evening per patient, personal matters pathologist it was Thursday evening. ?Patient denies nausea, vomiting. ?Patient denies any abdominal pains. ? ? ?Past Medical History:  ?Diagnosis Date  ? AKI (acute kidney injury) ([place]) 07/17/2021  ? Arthritis   ? Cirrhosis, non-alcoholic ([place])   ? Coronary artery disease   ? Fatty liver   ? Heart murmur   ? History of kidney stones   ? Hypothyroidism 07/17/2021  ? NASH Liver cirrhosis secondary to NASH (nonalcoholic steatohepatitis) 06/01/2019  ? NSTEMI (non-ST elevated myocardial infarction) ([place])   ? Other pancytopenia ([place]) 11/19/2014  ? Peripheral  arterial disease ([place]) 05/28/2019  ? Sleep apnea   ? uses CPAP  ? ? ?Surgical History:  ?He  has a past surgical history that includes Hernia repair (Right); Cataract extraction w/PHACO (Right, 08/12/2014); Cataract extraction w/PHACO (Left, 08/26/2014); Colonoscopy (N/A, 12/13/2014); Esophagogastroduodenoscopy (N/A, 12/13/2014); Tonsillectomy; Esophagogastroduodenoscopy (egd) with propofol (N/A, 08/31/2019); esophageal banding (N/A, 08/31/2019); and Esophagogastroduodenoscopy (egd) with propofol (N/A, 10/01/2020). ?Family History:  ?His family history includes Aneurysm in his father; Cancer in his father. ?Social History:  ? reports that he has never smoked. He has never been exposed to tobacco smoke. He has never used smokeless tobacco. He reports that he does not currently use alcohol. He reports that he does not use drugs. ? ?Prior to Admission medications   ?Medication Sig Start Date End Date Taking? Authorizing Provider  ?atorvastatin (LIPITOR) 20 MG tablet Take 20 mg by mouth at bedtime.  08/25/17  Yes [provider]  ?furosemide (LASIX) 20 MG tablet Take 20 mg by mouth daily.   Yes [provider]  ?lactulose (CHRONULAC) 10 GM/15ML solution Take 30 mLs (20 g total) by mouth 3 (three) times daily. ?Patient taking differently: Take 10 g by mouth 3 (three) times daily. 05/12/21  Yes Rehman, Mechele Dawley, MD  ?Levothyroxine Sodium 50 MCG CAPS Take 50 mcg by mouth daily before breakfast.   Yes [provider]  ?Omega-3 Fatty Acids (OMEGA 3 500 PO) Take 500 mg by mouth at bedtime.   Yes [provider]  ?rifaximin (XIFAXAN) 550 MG TABS tablet Take 1 tablet (  550 mg total) by mouth 2 (two) times daily. ?Patient taking differently: Take 550 mg by mouth 2 (two) times daily. Continuously 08/12/20  Yes Rehman, Mechele Dawley, MD  ?zinc gluconate 50 MG tablet Take 50 mg by mouth at bedtime.    Yes [provider]  ?amoxicillin-clavulanate (AUGMENTIN) 875-125 MG tablet Take 1 tablet by mouth 2 (two)  times daily. ?Patient not taking: Reported on 07/16/2021 06/29/21   [provider]  ? ? ?Current Facility-Administered Medications  ?Medication Dose Route Frequency Provider Last Rate Last Admin  ? 0.9 %

## 2021-07-18 NOTE — Hospital Course (Signed)
71 year old with NASH, cirrhosis with portal hypertension, possible hepatocellular CA, three-vessel CAD which precludes liver transplant, OSA, PAD was admitted 07/17/2021 with chest pain.  Work-up revealed elevated at bedtime troponin at 1865 with some T wave inversions on EKG.  Patient was seen by cardiology who felt that there was no further intervention that was possible and recommended ongoing conservative management. ?

## 2021-07-18 NOTE — Plan of Care (Signed)
?  Problem: Pain Managment: ?Goal: General experience of comfort will improve ?Outcome: Progressing ?  ?Problem: Safety: ?Goal: Ability to remain free from injury will improve ?Outcome: Progressing ?  ?Problem: Skin Integrity: ?Goal: Risk for impaired skin integrity will decrease ?Outcome: Progressing ?  ?Problem: Activity: ?Goal: Ability to tolerate increased activity will improve ?Outcome: Progressing ?  ?Problem: Cardiac: ?Goal: Ability to achieve and maintain adequate cardiovascular perfusion will improve ?Outcome: Progressing ?  ?

## 2021-07-18 NOTE — Evaluation (Signed)
Physical Therapy Evaluation ?Patient Details ?Name: Cody Austin ?MRN: [id number] ?DOB: [birth year] ?Today's Date: 07/18/2021 ? ?History of Present Illness ? 71 y.o. male experienced chest pain and came to ED 07/16/21 found to have NSTEMI and acute on chronic anemia and was admitted for treatment PMH: NASH, thrombocytopenia, diffuse coronary disease, turndown for liver transplant due to coronary disease, not a candidate for CABG or intervention due to complications of [name] (thrombocytopenia) and currently on medical therapy alone despite recent non-ST elevation MI.  ?Clinical Impression ? Patient evaluated by Physical Therapy with no further acute PT needs identified. Per Evaluation order daughter and patient interested in [place], however pt able to ambulate in hallway without assistance or DME and level of function exceeds level of function that warrants HHPT services. All education has been completed and the patient has no further questions. Per Cardiologist note Cardiac Rehab was recommended and PT concurs with assessment which is more appropriate for level of function and medical need. PT also recommending Mobility Team work with pt daily to maintain current high level of function. PT is signing off. Thank you for this referral. ?   ?   ? ?Recommendations for follow up therapy are one component of a multi-disciplinary discharge planning process, led by the attending physician.  Recommendations may be updated based on patient status, additional functional criteria and insurance authorization. ? ?Follow Up Recommendations Other (comment) (Cardiac Rehab) ? ?  ?Assistance Recommended at Discharge PRN  ?   ?Equipment Recommendations None recommended by PT  ?Recommendations for Other Services ? Other (comment) (Cardiac Rehab)  ?  ?Functional Status Assessment Patient has not had a recent decline in their functional status  ? ?  ?Precautions / Restrictions Precautions ?Precautions: None ?Restrictions ?Weight Bearing  Restrictions: No  ? ?  ? ?Mobility ?   ? ?Ambulation/Gait ?Ambulation/Gait assistance: Independent ?Gait Distance (Feet): 450 Feet ?Assistive device: None ?Gait Pattern/deviations: WFL(Within Functional Limits), Step-through pattern, Shuffle ?Gait velocity: WFL ?Gait velocity interpretation: >2.62 ft/sec, indicative of community ambulatory ?  ?General Gait Details: met pt walking in hallway with son at beginning of session ? ? ?  ? ?Balance Overall balance assessment: No apparent balance deficits (not formally assessed) ?  ?  ?  ?  ?  ?  ?  ?  ?  ?  ?  ?  ?  ?  ?  ?  ?  ?  ?   ? ? ? ?Pertinent Vitals/Pain Pain Assessment ?Pain Assessment: No/denies pain  ? ? ?Home Living Family/patient expects to be discharged to:: Private residence ?Living Arrangements: Spouse/significant other ?Available Help at Discharge: Family;Available 24 hours/day ?Type of Home: House ?Home Access: Stairs to enter ?  ?Entrance Stairs-Number of Steps: 3 ?  ?Home Layout: One level;Laundry or work area in basement (2 steps down to) ?Home Equipment: Hand held shower head ?   ?  ?Prior Function Prior Level of Function : Independent/Modified Independent;Driving ?  ?  ?  ?  ?  ?  ?  ?  ?  ? ? ?   ?Extremity/Trunk Assessment  ? Upper Extremity Assessment ?Upper Extremity Assessment: Overall WFL for tasks assessed;Generalized weakness ?  ? ?Lower Extremity Assessment ?Lower Extremity Assessment: Overall WFL for tasks assessed;Generalized weakness ?  ? ?   ?Communication  ? Communication: No difficulties  ?Cognition Arousal/Alertness: Awake/alert ?Behavior During Therapy: [facility] for tasks assessed/performed ?Overall Cognitive Status: Within Functional Limits for tasks assessed ?  ?  ?  ?  ?  ?  ?  ?  ?  ?  ?  ?  ?  ?  ?  ?  ?  ?  ?  ? ?  ?  General Comments General comments (skin integrity, edema, etc.): VSS on RA ? ?  ?   ? ?Assessment/Plan  ?  ?PT Assessment Patient does not need any further PT services  ?   ?   ? ?PT Goals (Current goals can be found in  the Care Plan section)  ?Acute Rehab PT Goals ?Patient Stated Goal: go home and stay ?PT Goal Formulation: All assessment and education complete, DC therapy ? ?  ? ?AM-PAC PT "6 Clicks" Mobility  ?Outcome Measure Help needed turning from your back to your side while in a flat bed without using bedrails?: None ?Help needed moving from lying on your back to sitting on the side of a flat bed without using bedrails?: None ?Help needed moving to and from a bed to a chair (including a wheelchair)?: None ?Help needed standing up from a chair using your arms (e.g., wheelchair or bedside chair)?: None ?Help needed to walk in hospital room?: None ?Help needed climbing 3-5 steps with a railing? : None ?6 Click Score: 24 ? ?  ?End of Session   ?Activity Tolerance: Patient tolerated treatment well ?Patient left: in chair;with call bell/phone within reach;with family/visitor present ?Nurse Communication: Mobility status ?PT Visit Diagnosis: Muscle weakness (generalized) (M62.81) ?  ? ?Time: 3735-7897 ?PT Time Calculation (min) (ACUTE ONLY): 17 min ? ? ?Charges:   PT Evaluation ?$PT Eval Low Complexity: 1 Low ?  ?  ?   ? ? ?Veronnica Hennings B. Migdalia Dk PT, DPT ?Acute Rehabilitation Services ?Pager [phone] ?Office [phone] ? ? ?[place] ?07/18/2021, 1:11 PM ? ?

## 2021-07-19 ENCOUNTER — Inpatient Hospital Stay (HOSPITAL_COMMUNITY): Payer: Medicare Other

## 2021-07-19 DIAGNOSIS — R188 Other ascites: Secondary | ICD-10-CM | POA: Diagnosis not present

## 2021-07-19 DIAGNOSIS — K7210 Chronic hepatic failure without coma: Secondary | ICD-10-CM | POA: Diagnosis not present

## 2021-07-19 DIAGNOSIS — D649 Anemia, unspecified: Secondary | ICD-10-CM | POA: Diagnosis not present

## 2021-07-19 DIAGNOSIS — I209 Angina pectoris, unspecified: Secondary | ICD-10-CM

## 2021-07-19 DIAGNOSIS — K7469 Other cirrhosis of liver: Secondary | ICD-10-CM | POA: Diagnosis not present

## 2021-07-19 DIAGNOSIS — R079 Chest pain, unspecified: Secondary | ICD-10-CM | POA: Diagnosis not present

## 2021-07-19 LAB — TYPE AND SCREEN
ABO/RH(D): A POS
Antibody Screen: NEGATIVE
Unit division: 0
Unit division: 0
Unit division: 0

## 2021-07-19 LAB — BPAM RBC
Blood Product Expiration Date: 202304262359
Blood Product Expiration Date: 202305012359
Blood Product Expiration Date: 202305012359
ISSUE DATE / TIME: 202303302241
ISSUE DATE / TIME: 202303311455
ISSUE DATE / TIME: 202304011435
Unit Type and Rh: 6200
Unit Type and Rh: 6200
Unit Type and Rh: 6200

## 2021-07-19 LAB — CBC WITH DIFFERENTIAL/PLATELET
Abs Immature Granulocytes: 0.02 10*3/uL (ref 0.00–0.07)
Basophils Absolute: 0 10*3/uL (ref 0.0–0.1)
Basophils Relative: 1 %
Eosinophils Absolute: 0.2 10*3/uL (ref 0.0–0.5)
Eosinophils Relative: 5 %
HCT: 24.7 % — ABNORMAL LOW (ref 39.0–52.0)
Hemoglobin: 8.3 g/dL — ABNORMAL LOW (ref 13.0–17.0)
Immature Granulocytes: 1 %
Lymphocytes Relative: 24 %
Lymphs Abs: 0.9 10*3/uL (ref 0.7–4.0)
MCH: 34 pg (ref 26.0–34.0)
MCHC: 33.6 g/dL (ref 30.0–36.0)
MCV: 101.2 fL — ABNORMAL HIGH (ref 80.0–100.0)
Monocytes Absolute: 0.6 10*3/uL (ref 0.1–1.0)
Monocytes Relative: 17 %
Neutro Abs: 1.8 10*3/uL (ref 1.7–7.7)
Neutrophils Relative %: 52 %
Platelets: 38 10*3/uL — ABNORMAL LOW (ref 150–400)
RBC: 2.44 MIL/uL — ABNORMAL LOW (ref 4.22–5.81)
RDW: 19.3 % — ABNORMAL HIGH (ref 11.5–15.5)
WBC: 3.5 10*3/uL — ABNORMAL LOW (ref 4.0–10.5)
nRBC: 0 % (ref 0.0–0.2)

## 2021-07-19 LAB — BODY FLUID CELL COUNT WITH DIFFERENTIAL
Eos, Fluid: 0 %
Lymphs, Fluid: 73 %
Monocyte-Macrophage-Serous Fluid: 8 % — ABNORMAL LOW (ref 50–90)
Neutrophil Count, Fluid: 19 % (ref 0–25)
Total Nucleated Cell Count, Fluid: 314 cu mm (ref 0–1000)

## 2021-07-19 LAB — COMPREHENSIVE METABOLIC PANEL
ALT: 41 U/L (ref 0–44)
AST: 74 U/L — ABNORMAL HIGH (ref 15–41)
Albumin: 1.9 g/dL — ABNORMAL LOW (ref 3.5–5.0)
Alkaline Phosphatase: 64 U/L (ref 38–126)
Anion gap: 4 — ABNORMAL LOW (ref 5–15)
BUN: 42 mg/dL — ABNORMAL HIGH (ref 8–23)
CO2: 31 mmol/L (ref 22–32)
Calcium: 8.2 mg/dL — ABNORMAL LOW (ref 8.9–10.3)
Chloride: 103 mmol/L (ref 98–111)
Creatinine, Ser: 1.58 mg/dL — ABNORMAL HIGH (ref 0.61–1.24)
GFR, Estimated: 47 mL/min — ABNORMAL LOW (ref >60)
Glucose, Bld: 103 mg/dL — ABNORMAL HIGH (ref 70–99)
Potassium: 4.1 mmol/L (ref 3.5–5.1)
Sodium: 138 mmol/L (ref 135–145)
Total Bilirubin: 4.9 mg/dL — ABNORMAL HIGH (ref 0.3–1.2)
Total Protein: 3.9 g/dL — ABNORMAL LOW (ref 6.5–8.1)

## 2021-07-19 LAB — HEMOGLOBIN AND HEMATOCRIT, BLOOD
HCT: 24.1 % — ABNORMAL LOW (ref 39.0–52.0)
Hemoglobin: 8.3 g/dL — ABNORMAL LOW (ref 13.0–17.0)

## 2021-07-19 LAB — GRAM STAIN

## 2021-07-19 LAB — TROPONIN I (HIGH SENSITIVITY): Troponin I (High Sensitivity): 1440 ng/L (ref <18)

## 2021-07-19 LAB — OCCULT BLOOD X 1 CARD TO LAB, STOOL: Fecal Occult Bld: POSITIVE — AB

## 2021-07-19 LAB — OSMOLALITY, URINE: Osmolality, Ur: 593 mOsm/kg (ref 300–900)

## 2021-07-19 MED ORDER — LIDOCAINE HCL (PF) 1 % IJ SOLN
INTRAMUSCULAR | Status: AC
  Filled 2021-07-19: qty 30

## 2021-07-19 MED ORDER — LACTULOSE 10 GM/15ML PO SOLN
20.0000 g | Freq: Three times a day (TID) | ORAL | Status: DC
  Administered 2021-07-19 – 2021-07-20 (×2): 20 g via ORAL
  Filled 2021-07-19 (×2): qty 30

## 2021-07-19 NOTE — Progress Notes (Signed)
? ?Progress Note ? ?Patient Name: Cody Austin ?Date of Encounter: 07/19/2021 ? ?[facility] Cardiologist: None  ? ?Subjective  ? ?S/p Paracentesis. ?No further CP. ?BP low; Imdur held.  Lopressor tolerated. ?He is coming to grips with his long term prognosis. ? ?Inpatient Medications  ?  ?Scheduled Meds: ? atorvastatin  20 mg Oral QHS  ? isosorbide mononitrate  30 mg Oral Daily  ? lactulose  10 g Oral TID  ? levothyroxine  50 mcg Oral QAC breakfast  ? metoprolol tartrate  12.5 mg Oral BID  ? pantoprazole (PROTONIX) IV  40 mg Intravenous Q12H  ? rifaximin  550 mg Oral BID  ? sucralfate  1 g Oral BID  ? ?Continuous Infusions: ? octreotide  (SANDOSTATIN)    IV infusion 50 mcg/hr (07/19/21 0342)  ? ? ?PRN Meds: ?  ? ?Vital Signs  ?  ?Vitals:  ? 07/19/21 0835 07/19/21 0900 07/19/21 0941 07/19/21 1143  ?BP: (!) 104/52 92/78 (!) 99/52 (!) 97/49  ?Pulse:   (!) 59 (!) 50  ?Resp:   15 16  ?Temp:      ?TempSrc:      ?SpO2:    100%  ?Weight:      ?Height:      ? ? ?Intake/Output Summary (Last 24 hours) at 07/19/2021 1207 ?Last data filed at 07/19/2021 0800 ?Gross per 24 hour  ?Intake 1151.42 ml  ?Output --  ?Net 1151.42 ml  ? ? ?  07/16/2021  ?  7:29 PM 07/02/2021  ?  3:10 PM 05/12/2021  ?  9:07 AM  ?Last 3 Weights  ?Weight (lbs) 208 lb 236 lb 3.2 oz 224 lb  ?Weight (kg) 94.348 kg 107.14 kg 101.606 kg  ?   ? ?Telemetry  ?  ?SR 1st HB, sinus brady- Personally Reviewed ? ?Physical Exam  ? ?Gen: no distress, obese  ?Neck: No JVD ?Cardiac: No Rubs or Gallops, 3/6 SEM RRR, +1 pulses ?Respiratory: Clear to auscultation bilaterally, normal effort, normal  respiratory rate ?GI: Soft, nontender, non-distended  ?MS: non pitting  edema;  moves all extremities ?Integument: Skin feels warm; notable Jaundiced ?Neuro:  At time of evaluation, alert and oriented to person/place/time/situation ; no astereixis ?Psych: Normal affect, patient feels better ? ? ?Labs  ?  ?High Sensitivity Troponin:   ?Recent Labs  ?Lab 07/16/21 ?1645 07/16/21 ?1848  07/17/21 ?0216 07/19/21 ?0250  ?TROPONINIHS 1,749* 1,865* 1,850* 1,440*  ?   ?Chemistry ?Recent Labs  ?Lab 07/16/21 ?1645 07/17/21 ?0215 07/19/21 ?0250  ?NA 136 132* 138  ?K 4.4 4.3 4.1  ?CL 101 99 103  ?CO2 27 28 31   ?GLUCOSE 104* 95 103*  ?BUN 46* 51* 42*  ?CREATININE 1.69* 1.57* 1.58*  ?CALCIUM 8.6* 8.6* 8.2*  ?PROT  --  4.2* 3.9*  ?ALBUMIN  --  2.0* 1.9*  ?AST  --  64* 74*  ?ALT  --  36 41  ?ALKPHOS  --  54 27  ?BILITOT  --  6.5* 4.9*  ?GFRNONAA 43* 47* 47*  ?ANIONGAP 8 5 4*  ?  ?Lipids No results for input(s): CHOL, TRIG, HDL, LABVLDL, LDLCALC, CHOLHDL in the last 168 hours.  ?Hematology ?Recent Labs  ?Lab 07/17/21 ?0215 07/17/21 ?1334 07/17/21 ?2213 07/18/21 ?1938 07/19/21 ?0250 07/19/21 ?1045  ?WBC 4.4 5.5  --   --  3.5*  --   ?RBC 1.97* 1.89*  --   --  2.44*  --   ?HGB 7.2* 6.8*   < > 8.8* 8.3* 8.3*  ?HCT 20.8*  20.0*   < > 25.5* 24.7* 24.1*  ?MCV 105.6* 105.8*  --   --  101.2*  --   ?MCH 36.5* 36.0*  --   --  34.0  --   ?MCHC 34.6 34.0  --   --  33.6  --   ?RDW 18.3* 18.7*  --   --  19.3*  --   ?PLT 41* 52*  --   --  38*  --   ? < > = values in this interval not displayed.  ? ?Thyroid No results for input(s): TSH, FREET4 in the last 168 hours.  ?BNPNo results for input(s): BNP, PROBNP in the last 168 hours.  ?DDimer No results for input(s): DDIMER in the last 168 hours.  ? ?Radiology  ?  ?No results found. ? ?Cardiac Studies  ? ?Recent echocardiogram 07/12/2021 at [facility]: ?Echo per care everywhere: ? 1. Left ventricular ejection fraction, by estimation, is 60 to 65%. The left ventricle has normal function. The left ventricle has no regional wall motion abnormalities. The left ventricular internal cavity size was mildly dilated. There is mild  ?concentric left ventricular hypertrophy. Left ventricular diastolic parameters are consistent with Grade II diastolic dysfunction (pseudonormalization). Elevated left atrial pressure.  ? 2. Right ventricular systolic function is normal. The right ventricular  size is normal. There is normal pulmonary artery systolic pressure.  ? 3. Left atrial size was severely dilated.  ? 4. Right atrial size was moderately dilated.  ? 5. No evidence of mitral valve regurgitation.  ? 6. AV is thickened, calcified with restricted motion. Peak and mean gradients through the valve are 37 and 20 mm Hg respectively AVA (VTI) is 1.96 cm2. Dimensionless index is 0.56 All consistent with mild AS . The aortic valve is tricuspid. Aortic valve  ? regurgitation is not visualized.  ? 7. The inferior vena cava is normal in size with greater than 50% respiratory variability, suggesting right atrial pressure of 3 mmHg.   ?Coronary angiography 07/13/2021: ?CONCLUSIONS: ?-Severe coronary artery disease involving the severely diffused right  ?coronary artery, subtotaled circumflex marginal system and large ramus ?-Mild diffuse plaque involving the LAD and diagonals ?-Likely at least moderate aortic stenosis with 21 mm mean gradient across  ?the aortic valve ?-Normal left ventricular end-diastolic pressure  ? ? ? ?Patient Profile  ?   ?71 y.o. male with NASH, thrombocytopenia, diffuse coronary disease, turndown for liver transplant due to coronary disease, not a candidate for CABG or intervention due to complications of Cody Austin (thrombocytopenia) and currently on medical therapy alone despite recent non-ST elevation MI. ? ?Assessment & Plan  ?  ?Non-ST elevation myocardial infarction:  ? 3VD from 4/49/67 LHC complicated by cirrhosis ? Not a candidate for PCI or surgery or DAPT ? Asymptomatic with Hgb goa above 7.4; if worsening sx would increase hgb goal to 8.0 ? Continue  metoprolol 12.5 BID, atorvastatin 20 (cirrhosis) given BP will give PRN nitro only ?Severe anemia: No further bleeding; as above ?NASH cirrhosis : Agree with palliative care. ?Significant aortic stenosis: No clinical evidence of heart failure at this time., would not be a candidate for TAVR ? ?Discussed at length with patient and son (lawyer  one of the twins).  They are not yet ready for [facility] care but understand the poor prognosis.  Would like to see me or HS in follow up.  We will arrange this ? ? ?[facility] will sign off.   ?Medication Recommendations:  metoprolol, PRN nitro ?Other recommendations (labs,  testing, etc):  None ?Follow up as an outpatient:  Will arrange f/u ? ? ? ? ?For questions or updates, please contact [place] ?Please consult [email or web address] for contact info under  ? ?  ?   ?Signed, ?Werner Lean, MD  ?07/19/2021, 12:07 PM   ? ?

## 2021-07-19 NOTE — Progress Notes (Signed)
I have sent a message to our office's scheduling team requesting a follow-up appointment (April-May/OK to overbook per Dr. Gasper Sells), and our office will call the patient with this information. ? ? ?

## 2021-07-19 NOTE — Progress Notes (Signed)
?Progress Note ? ? ?PatientBYREN Austin KMM:[id number] DOB: [birth year] DOA: 07/16/2021     2 ?DOS: the patient was seen and examined on 07/19/2021 ?  ?Brief hospital course: ? ?71 year old with NASH, cirrhosis with portal hypertension, possible hepatocellular CA, three-vessel CAD which precludes liver transplant, OSA, PAD was admitted 07/17/2021 with chest pain.  Work-up revealed elevated at bedtime troponin at 1865 with some T wave inversions on EKG.  Patient was seen by cardiology who felt that there was no further intervention that was possible and recommended ongoing conservative management. ? ? ?Subjective:  ? ?Discussed that patient appears to be doing better with improvement in blood pressure and in hemoglobin after transfusion.  I note that hemoglobin is stable this morning and blood pressure continues to improve. ? ?Discussed discharge tomorrow.  Patient states that he does not want to "bounce back".  Reviewed again that patient is extremely ill and it is likely that he will come back given ongoing end-stage CAD and end-stage liver disease with no further treatment options.  I noted again that he would probably benefit from being in hospice. ? ?Both father and son agree that patient would be able to go home tomorrow.  Patient is able to ambulate to the bathroom has been evaluated by PT and does not need any assistive devices. ? ? ?Vitals:  ? 07/19/21 1143 07/19/21 1200 07/19/21 1255 07/19/21 1416  ?BP: (!) 97/49  (!) 118/57 (!) 105/56  ?Pulse: (!) 50 (!) 57 (!) 57 (!) 58  ?Resp: 16 14 15  (!) 21  ?Temp: 97.8 ?F (36.6 ?C)  (!) 97.5 ?F (36.4 ?C) 97.8 ?F (36.6 ?C)  ?TempSrc: Oral  Oral Oral  ?SpO2: 100% 100% 100% 97%  ?Weight:      ?Height:      ? ?Physical Exam: ?Blood pressure (!) 105/56, pulse (!) 58, temperature 97.8 ?F (36.6 ?C), temperature source Oral, resp. rate (!) 21, height 5' 7"  (1.702 m), weight 94.3 kg, SpO2 97 %. ?Gen: Obese gentleman appearing tired lying in bed with attentive son at  bedside ?Eyes: sclera anicteric, conjuctiva mildly injected bilaterally ?CVS: S1-S2, regulary, no gallops ?Respiratory:  decreased air entry likely secondary to decreased inspiratory effort ?GI: Protuberant, firm but not hard. ?LE: 3+ edema ?Neuro: Somewhat lethargic however coherent, grossly nonfocal.  ?Psych: patient is logical and coherent, insight into disease processes are poor, mood and affect appropriate to situation. ? ? ?Assessment and Plan: ? ?Disposition ?Patient and son agreed to discharge tomorrow as we have no further interventions available to help the patient.  Patient continues to have active CAD with elevated troponins however as delineated below, we have exhausted our therapeutic options.  Patient also has end-stage liver disease, underwent paracentesis earlier today but does not feel any improvement.  Patient is able to ambulate to the bathroom without assistive devices, has been evaluated by PT.  Patient states he does not want to "bounce back" however we discussed the likelihood of his being readmitted given ongoing significant medical problems although we have no further therapeutic interventions available for him.  Patient and family once again declined referral to hospice or palliative care and are comfortable taking patient home. ? ?* Chest pain ?CAD ?Recently admitted for NSTEMI at [facility] and cardiac catheterization done 3/27 revealed severe three-vessel disease. ?Appreciate cardiology consult ?Not a candidate for CABG.   ?Not a candidate for PCI due to AKI, acute on chronic anemia, and thrombocytopenia.   ?Not a candidate for antiplatelets given thrombocytopenia ?High-sensitivity troponin remains  elevated (1749 > 1865 > 1850).  ?Continue long-acting nitrate (Imdur 30 mg daily) ?It is unclear to me if patient understands significance of ongoing elevation of troponin and our inability to provide any disease modifying agents due to multiple comorbidities. ?Patient will see cardiology in  consultation after discharge per their request. ? ?Acute on chronic anemia ?Patient has responded well to 1 unit PRBC ?H&H have responded appropriately and have been stable on repeat ?Blood pressure also improved. ? EGD done in June 2022 showing grade 1 esophageal varices and portal hypertensive gastropathy. ?GI was consulted who wrote a wonderful note delineating difficulties with more aggressive treatment. ?Continue octreotide for 48 hours ?Convert IV PPI to oral PPI on discharge ?. ? ?End stage liver disease ([place]) ?No longer a candidate for liver transplant given three-vessel CAD.  ?Patient underwent paracentesis this morning without any improvement in symptomatology. ?Continue rifaximin and lactulose ?Lasix and spironolactone can be restarted at home although patient will need creatinine checked within a couple of days after discharge. ? ?AKI (acute kidney injury) ([place]) ?Creatinine seems to have stabilized around 1.5. ? ?Thrombocytopenia ([place]) ?Chronic and likely due to end-stage liver disease. Platelet count 53k, stable since labs done at [facility] on 3/29. ?-No anticoagulation or antiplatelet agents ?-Monitor platelet count closely and monitor for signs of bleeding ? ?Goals of care, counseling/discussion ?Patient with extremely poor prognosis however multiple discussions with providers, palliative care, cardiology and gastroenterology result and continued desire to get better.  It is unclear to me if patient understands the gravity of his combination of medical conditions. ? ? ?Hypothyroidism ?-Continue Synthroid ? ?HLD (hyperlipidemia) ?-Continue Lipitor ? ?OSA (obstructive sleep apnea) ?-Continue nightly CPAP ? ? ?  ? ?Family Communication: Patient's son was at bedside throughout ? ?Disposition: ?Status is: Inpatient ?Remains inpatient appropriate because: Ongoing hypotension and drop in H&H with need for ongoing transfusion. ? Planned Discharge Destination:  TBD ? ? ? ?Time spent: 45 minutes ? ?Author: ?[name], MD ?07/19/2021 4:03 PM ? ?For on call review [email or web address].  ?

## 2021-07-19 NOTE — Procedures (Signed)
PROCEDURE SUMMARY: ? ?Successful ultrasound guided paracentesis from the right lower quadrant.  ?Yielded 1.3 L of clear yellow fluid.  ?No immediate complications.  ?The patient tolerated the procedure well.  ? ?Specimen sent for labs. ? ?EBL < 2 mL ? ?The patient has required >/=2 paracenteses in a 30 day period and a formal evaluation by the [facility] has been arranged. ? Soyla Dryer, AGACNP-BC ?[phone] ?07/19/2021, 10:00 AM ? ? ?

## 2021-07-19 NOTE — Progress Notes (Signed)
Patient returned from paracentesis.  Dressing to abd with small amt of drainage.  Will monitor. ?

## 2021-07-19 NOTE — Progress Notes (Signed)
?   07/19/21 1143  ?Assess: MEWS Score  ?BP (!) 97/49  ?Pulse Rate (!) 50  ?ECG Heart Rate (!) 50  ?Resp 16  ?SpO2 100 %  ?O2 Device Room Air  ?Assess: MEWS Score  ?MEWS Temp 0  ?MEWS Systolic 1  ?MEWS Pulse 1  ?MEWS RR 0  ?MEWS LOC 0  ?MEWS Score 2  ?MEWS Score Color Yellow  ?Assess: if the MEWS score is Yellow or Red  ?Were vital signs taken at a resting state? Yes  ?Focused Assessment No change from prior assessment  ?Early Detection of Sepsis Score *See Row Information* Low  ?MEWS guidelines implemented *See Row Information* Yes  ?Treat  ?MEWS Interventions Other (Comment) ?(monitor BP)  ?Pain Scale 0-10  ?Pain Score 0  ?Take Vital Signs  ?Increase Vital Sign Frequency  Yellow: Q 2hr X 2 then Q 4hr X 2, if remains yellow, continue Q 4hrs  ?Escalate  ?MEWS: Escalate Yellow: discuss with charge nurse/RN and consider discussing with provider and RRT  ?Notify: Charge Nurse/RN  ?Name of Charge Nurse/RN Notified Yoko RN  ?Date Charge Nurse/RN Notified 07/19/21  ?Time Charge Nurse/RN Notified 1147  ?Notify: Provider  ?Provider Name/Title Dr. Jamse Arn  ?Date Provider Notified 07/19/21  ?Time Provider Notified 1150  ?Notification Type Page  ?Notification Reason Other (Comment) ?(yellow MEWS)  ?Provider response No new orders  ?Date of Provider Response 07/19/21  ?Time of Provider Response 1150  ?Document  ?Patient Outcome Other (Comment) ?(Remains stable)  ?Progress note created (see row info) Yes  ? ? ?

## 2021-07-19 NOTE — Progress Notes (Signed)
BP 99/52, HR 58-62 on monitor.  Due for lopressor 12.82m and imdur 359m  D.Royden PurlA notified.  Will hold until patient seen by Dr. ChGasper Sells ?

## 2021-07-19 NOTE — Progress Notes (Signed)
Dr. Jamse Arn here to see patient.  Advised that lopressor be given and imdur will be held.   ?

## 2021-07-19 NOTE — Progress Notes (Signed)
? ? Progress Note ? ? Subjective  ?Chief Complaint: Decompensated cirrhosis, grade 1 varices, anemia nonresponsive to packed red blood cells, severe 3 vessel coronary artery disease not amendable to surgery due to several comorbidities ? ?Patient's hemoglobin stable today. ?Has been getting his lactulose 2010 mg 3 times daily, has not had bowel movement since being here. ?While this bodes well for the not being an acute GI bleed, patient should be try to titrate to 2 bowel movements a day. ?No fever, no chills. ?No abdominal pain. ?Patient is interested in going home at this time. ? ? ? Objective  ? ?Vital signs in last 24 hours: ?Temp:  [97.2 ?F (36.2 ?C)-98.4 ?F (36.9 ?C)] 97.8 ?F (36.6 ?C) (04/02 1416) ?Pulse Rate:  [50-67] 58 (04/02 1416) ?Resp:  [14-21] 21 (04/02 1416) ?BP: (92-118)/(49-78) 105/56 (04/02 1416) ?SpO2:  [96 %-100 %] 97 % (04/02 1416) ?Last BM Date : 07/17/21 ? ?General:   Alert, well developed male in no acute distress ?Eyes: scleral icterus,conjunctive icteric  ?Heart:  regular rate and rhythm, severe AS ?Pulm: Clear anteriorly; no wheezing ?Abdomen:  Soft, Obese AB, skin exam normal, Sluggish bowel sounds.  no  tenderness .  ?Extremities:  With 2 +edema. ?Msk:  Symmetrical without gross deformities. Peripheral pulses intact.  ?Neurologic:  Alert and  oriented x4;  grossly normal neurologically. without asterixis or clonus.  ?Skin:  without jaundice. no palmar erythema or spider angioma.   ?Psychiatric: Demonstrates good judgement and reason without abnormal affect or behaviors. ? ? ?Intake/Output from previous day: ?04/01 0701 - 04/02 0700 ?In: 923.9 [P.O.:420; I.V.:503.9] ?Out: -  ?Intake/Output this shift: ?Total I/O ?In: 523.4 [P.O.:240; I.V.:283.4] ?Out: -  ? ?Lab Results: ?Recent Labs  ?  07/17/21 ?0215 07/17/21 ?1334 07/17/21 ?2213 07/18/21 ?1938 07/19/21 ?0250 07/19/21 ?1045  ?WBC 4.4 5.5  --   --  3.5*  --   ?HGB 7.2* 6.8*   < > 8.8* 8.3* 8.3*  ?HCT 20.8* 20.0*   < > 25.5* 24.7* 24.1*   ?PLT 41* 52*  --   --  38*  --   ? < > = values in this interval not displayed.  ? ?BMET ?Recent Labs  ?  07/16/21 ?1645 07/17/21 ?0215 07/19/21 ?0250  ?NA 136 132* 138  ?K 4.4 4.3 4.1  ?CL 101 99 103  ?CO2 27 28 31   ?GLUCOSE 104* 95 103*  ?BUN 46* 51* 42*  ?CREATININE 1.69* 1.57* 1.58*  ?CALCIUM 8.6* 8.6* 8.2*  ? ?LFT ?Recent Labs  ?  07/17/21 ?0215 07/19/21 ?0250  ?PROT 4.2* 3.9*  ?ALBUMIN 2.0* 1.9*  ?AST 64* 74*  ?ALT 36 41  ?ALKPHOS 61 64  ?BILITOT 6.5* 4.9*  ?BILIDIR 1.8*  --   ?IBILI 4.7*  --   ? ?PT/INR ?Recent Labs  ?  07/16/21 ?1926 07/18/21 ?1623  ?LABPROT 29.0* 26.9*  ?INR 2.7* 2.5*  ? ? ?Studies/Results: ?US Paracentesis ? ?Result Date: 07/19/2021 ?INDICATION: Patient with a history of end-stage liver disease presents today with ascites. Interventional radiology asked to perform a diagnostic and therapeutic paracentesis. EXAM: ULTRASOUND GUIDED PARACENTESIS MEDICATIONS: 1% lidocaine 10 mL COMPLICATIONS: None immediate. PROCEDURE: Informed written consent was obtained from the patient after a discussion of the risks, benefits and alternatives to treatment. A timeout was performed prior to the initiation of the procedure. Initial ultrasound scanning demonstrates a large amount of ascites within the right lower abdominal quadrant. The right lower abdomen was prepped and draped in the usual sterile fashion. 1% lidocaine was  used for local anesthesia. Following this, a 19 gauge, 7-cm, Yueh catheter was introduced. An ultrasound image was saved for documentation purposes. The paracentesis was performed. The catheter was removed and a dressing was applied. The patient tolerated the procedure well without immediate post procedural complication. FINDINGS: A total of approximately 1.3 L of clear yellow fluid was removed. Samples were sent to the laboratory as requested by the clinical team. IMPRESSION: Successful ultrasound-guided paracentesis yielding 1.3 liters of peritoneal fluid. Read by: Soyla Dryer, NP  PLAN: The patient has required >/=2 paracenteses in a 30 day period and a formal evaluation by the [facility] has been arranged. Electronically Signed   By: Lucrezia Europe M.D.   On: 07/19/2021 12:26   ? ? ? Impression/Plan:  ? ?Assessment: ?Decompensated cirrhosis with non-STEMI, nonoperable three-vessel severe coronary artery disease, with anemia not responding to packed red blood cells. No a transplant or surgical candidate.   ?Patient will need close follow-up with liver clinic versus outpatient GI primary doctor. ? ?NSTEMI ?Cardiac catheterization 03/27 severe three-vessel disease not a candidate for CABG not a candidate for PCI due to to AKI, thrombocytopenia. ?High-sensitivity troponin elevated but stable. ?No antiplatelets or heparin due to thrombocytopenia. ?  ?Decompensated cirrhosis  ?HGB 8.3 Platelets 38 ?AST 74 ALT 41  ?Alkphos 64 TBili 4.9 ?GFR 47  ?INR 07/18/2021 2.5  ?MELD-Na score: 27 at 07/19/2021  2:50 AM ?MELD score: 27 at 07/19/2021  2:50 AM ?Calculated from: ?Serum Creatinine: 1.58 mg/dL at 07/19/2021  2:50 AM ?Serum Sodium: 138 mmol/L (Using max of 137 mmol/L) at 07/19/2021  2:50 AM ?Total Bilirubin: 4.9 mg/dL at 07/19/2021  2:50 AM ?INR(ratio): 2.5 at 07/18/2021  4:23 PM ?Age: 71 years ? ?Esophageal Varices: ?Episodes of melena prior to this visit, has not had any stools since being in the hospital . ?Patient hemoglobin stable today. ?last EGD 10/01/2020 with grade 1 varices not banded due to size. ?Has been on IV PPI and octreotide. ?-Per patient hoping to go home tomorrow. ?-Continue proton pump inhibitor twice daily, can discuss with cardiology switching metoprolol to carvedilol due to esophageal varices ? ?Ascites:  ?-s/p paracentesis 07/18/2020- negative SBP ?-monitor kidney function for possible restarting diuretics outpatients ?- monitor for hypervolemia.  ?  ? Hepatic Encephalopathy:  ?11/26/2020 Ammonia 53 ?-Patient only getting 10 g 3 times daily has  not had a bowel movement,  increase to 20 g 3 times daily-please titrate to 2 bowel movements daily.  This causes too much gas/bloating could not add MiraLAX instead ?- Rifaximin 57m bid ?  ?with Coagulopathy ?2.7-->2.5 ?Received vitamin K yesterday and today.  ? ?Acute renal failure ?BUN 42 Cr 1.58  ?GFR 47  ?Urine sodium 19 ?-Avoid large volume paracentesis ?-Avoid nephrotoxic drugs ?- outpatient can decided on adding back on low dose diuretics if patient/kidney function can tolerate it. ? ?Thrombocytopenia ?CBC on 07/19/2021   ? Platelets 38 ? ?Acute on chronic anemia ?Admitting hemoglobin 7.2, 8.7 on 03/29 but has had decreased HGB since 03/20 at 9.5 with baseline 11-12 before that.  ?Patient has had 2 units PRBC, 1 on 03/30 without significant increase and another on 03/31 with HGB 6.8, final H/H 7.4.  ?HGB 8.3 MCV 101.2 ?Anemia studies on 07/16/2021  ?Iron 104 Ferritin 189 B12 1,811 ?Normal iron. ?CKD ?Likely poor bone marrow response with liver disease ?Likely component of anemia of chronic disease ? ?Possible liver mass ?07/06/2021  AFP 14.6 ?- continue follow up with outpatient GI/liver clinic ? ? ? ?  Future Appointments  ?Date Time Provider [place]  ?07/30/2021  3:00 PM Rehman, Mechele Dawley, MD [facility] None  ?09/10/2021  3:00 PM Rehman, Mechele Dawley, MD [facility] None  ? ? ? ? LOS: 2 days  ? ?Vladimir Crofts  07/19/2021, 3:58 PM ? ? ? ?

## 2021-07-19 NOTE — Progress Notes (Signed)
Patient ambulated in hall without assistance.  Tolerated without problems. ?

## 2021-07-19 NOTE — Progress Notes (Signed)
Pt has home device and places self on/off when needed. RT will cont to monitor. ?

## 2021-07-19 NOTE — Progress Notes (Signed)
Mobility Specialist Progress Note  ? ? 07/19/21 1402  ?Mobility  ?Activity Ambulated independently in hallway  ?Level of Assistance Modified independent, requires aide device or extra time  ?Assistive Device  ?(IV pole)  ?Distance Ambulated (ft) 370 ft  ?Activity Response Tolerated well  ?$Mobility charge 1 Mobility  ? ?Pre-Mobility: 54 HR, 97% SpO2 ?During Mobility: 76 HR ? ?Pt received in bed and agreeable. No complaints on walk. Left in BR with son present and advised to pull string when ready to return to bed.  ? ?Cody Austin ?Mobility Specialist  ?  ?

## 2021-07-19 NOTE — Progress Notes (Signed)
Patient taken to radiology in bed. ?

## 2021-07-20 DIAGNOSIS — K7682 Hepatic encephalopathy: Secondary | ICD-10-CM

## 2021-07-20 DIAGNOSIS — K766 Portal hypertension: Secondary | ICD-10-CM

## 2021-07-20 LAB — CBC WITH DIFFERENTIAL/PLATELET
Abs Immature Granulocytes: 0.01 10*3/uL (ref 0.00–0.07)
Basophils Absolute: 0 10*3/uL (ref 0.0–0.1)
Basophils Relative: 1 %
Eosinophils Absolute: 0.1 10*3/uL (ref 0.0–0.5)
Eosinophils Relative: 4 %
HCT: 26.2 % — ABNORMAL LOW (ref 39.0–52.0)
Hemoglobin: 8.7 g/dL — ABNORMAL LOW (ref 13.0–17.0)
Immature Granulocytes: 0 %
Lymphocytes Relative: 22 %
Lymphs Abs: 0.8 10*3/uL (ref 0.7–4.0)
MCH: 33.9 pg (ref 26.0–34.0)
MCHC: 33.2 g/dL (ref 30.0–36.0)
MCV: 101.9 fL — ABNORMAL HIGH (ref 80.0–100.0)
Monocytes Absolute: 0.7 10*3/uL (ref 0.1–1.0)
Monocytes Relative: 20 %
Neutro Abs: 1.9 10*3/uL (ref 1.7–7.7)
Neutrophils Relative %: 53 %
Platelets: 40 10*3/uL — ABNORMAL LOW (ref 150–400)
RBC: 2.57 MIL/uL — ABNORMAL LOW (ref 4.22–5.81)
RDW: 19.9 % — ABNORMAL HIGH (ref 11.5–15.5)
WBC: 3.5 10*3/uL — ABNORMAL LOW (ref 4.0–10.5)
nRBC: 0 % (ref 0.0–0.2)

## 2021-07-20 LAB — COMPREHENSIVE METABOLIC PANEL
ALT: 45 U/L — ABNORMAL HIGH (ref 0–44)
AST: 86 U/L — ABNORMAL HIGH (ref 15–41)
Albumin: 1.9 g/dL — ABNORMAL LOW (ref 3.5–5.0)
Alkaline Phosphatase: 60 U/L (ref 38–126)
Anion gap: 4 — ABNORMAL LOW (ref 5–15)
BUN: 28 mg/dL — ABNORMAL HIGH (ref 8–23)
CO2: 26 mmol/L (ref 22–32)
Calcium: 7.7 mg/dL — ABNORMAL LOW (ref 8.9–10.3)
Chloride: 104 mmol/L (ref 98–111)
Creatinine, Ser: 1.43 mg/dL — ABNORMAL HIGH (ref 0.61–1.24)
GFR, Estimated: 53 mL/min — ABNORMAL LOW (ref >60)
Glucose, Bld: 108 mg/dL — ABNORMAL HIGH (ref 70–99)
Potassium: 3.7 mmol/L (ref 3.5–5.1)
Sodium: 134 mmol/L — ABNORMAL LOW (ref 135–145)
Total Bilirubin: 5 mg/dL — ABNORMAL HIGH (ref 0.3–1.2)
Total Protein: 4 g/dL — ABNORMAL LOW (ref 6.5–8.1)

## 2021-07-20 LAB — CYTOLOGY - NON PAP

## 2021-07-20 LAB — AFP TUMOR MARKER: AFP, Serum, Tumor Marker: 9.2 ng/mL — ABNORMAL HIGH (ref 0.0–8.4)

## 2021-07-20 MED ORDER — PANTOPRAZOLE SODIUM 40 MG PO TBEC: 40.0000 mg | DELAYED_RELEASE_TABLET | Freq: Two times a day (BID) | ORAL | Status: DC

## 2021-07-20 MED ORDER — PANTOPRAZOLE SODIUM 40 MG PO TBEC: 40.0000 mg | DELAYED_RELEASE_TABLET | Freq: Every day | ORAL | 0 refills | Status: DC

## 2021-07-20 MED ORDER — METOPROLOL TARTRATE 25 MG PO TABS: 12.5000 mg | ORAL_TABLET | Freq: Two times a day (BID) | ORAL | 0 refills | Status: DC

## 2021-07-20 MED ORDER — ISOSORBIDE MONONITRATE ER 30 MG PO TB24: 30.0000 mg | ORAL_TABLET | Freq: Every day | ORAL | 0 refills | Status: DC

## 2021-07-20 MED ORDER — LACTULOSE 10 GM/15ML PO SOLN: 20.0000 g | Freq: Three times a day (TID) | ORAL | 5 refills | Status: DC

## 2021-07-20 MED ORDER — SUCRALFATE 1 G PO TABS
1.0000 g | ORAL_TABLET | Freq: Two times a day (BID) | ORAL | 0 refills | Status: DC
Start: 2021-07-20 — End: 2021-08-25

## 2021-07-20 NOTE — Discharge Summary (Signed)
?                                                                                ? Cody Austin:[id number] DOB: Jan 26, 1951 DOA: 07/16/2021 ? ?PCP: [name], MD ? ?Admit date: 07/16/2021  Discharge date: 07/20/2021 ? ?Admitted From: Home   disposition: Home ? ? ?Recommendations for Outpatient Follow-up:  ? ?Follow up with your liver doctor Dr. Corbin Ade in 3 to 4 days ?Follow-up with cardiology as previously arranged ? ?Home Health: N/A ?Equipment/Devices: N/A ?Consultations: Gastroenterology and cardiology ?Discharge Condition: Improved ?CODE STATUS: Full ?Diet Recommendation: Heart Healthy low-sodium ? ?Diet Order   ? ?       ?  Diet - low sodium heart healthy       ?  ?  Diet Heart Room service appropriate? Yes; Fluid consistency: Thin  Diet effective now       ?  ? ?  ?  ? ?  ?  ? ?Chief Complaint  ?Patient presents with  ? Chest Pain  ?  ? ?Brief history of present illness from the day of admission and additional interim summary   ? ?Cody Austin is a 71 y.o. male with medical history significant of NAFLD cirrhosis, OSA on CPAP, hyperlipidemia, hypothyroidism, PAD.  Recently admitted to [facility] for expedited transplant work-up.  He had evidence of ascites and underwent paracentesis on 3/28 with 2 L off. Also presented with creatinine 1.4, baseline 0.8-1.0.  Continued to be fluid overloaded despite albumin and diuresis with IV Lasix. During this admission he had chest pain and evidence of NSTEMI (ST depressions in V2-V5, troponin peaked at 15,349).  Cardiac catheterization done 3/27 revealed severe three-vessel disease and thus was no longer a candidate for transplant.  Also deemed not a good candidate for cardiac intervention given significant thrombocytopenia. Cardiology recommended outpatient cardiac rehab and follow-up with a local cardiologist. ? ?Patient was admitted 07/16/2021 complaining of chest pain.Vital  signs stable.  Labs showing hemoglobin 7.2, MCV 109.2.  Platelet count 53k.  BUN 46, creatinine 1.6 (baseline 0.8-1.0). INR 2.7.  Vitamin B12 level 1811.  Folate normal.  Iron 104, TIBC 104, ferritin 189. High-sensitivity troponin 1749 > 1865.  Initial EKG showing sinus rhythm with first-degree AV block, T wave inversions in lead I and aVL, and ST depressions in V3-4. Chest x-ray showing mildly decreased lung volumes with mild bronchovascular crowding.  No acute lung process. Patient had recurrence of chest pain in the ED and repeat EKG showing new ST elevations in the inferior leads and worsening ST depressions/T wave inversions in lateral leads.  Cardiology consulted.  Patient was given 1 unit PRBCs. ? ? ? ?                                                               Hospital Course  ? ? ?71 year old with NASH, cirrhosis with portal hypertension, possible hepatocellular CA, three-vessel CAD which precludes liver transplant, OSA, PAD was admitted  07/17/2021 with chest pain.  Work-up revealed elevated at bedtime troponin at 1865 with some T wave inversions on EKG.  Patient was seen by cardiology who felt that there was no further intervention that was possible and recommended ongoing conservative management.  Hospital course was complicated by drop in hemoglobin and some hypotension.  Patient was seen by GI who noted the multiple complications associated with further evaluation.  They recommended conservative management and he was treated with 1 unit PRBC blood transfusion and IV fluid resuscitation.  Patient responded very well with improved H&H which remained stable and improved blood pressure despite initiation of Imdur and metoprolol.  Patient was subsequently treated with paracentesis however noted no real change in his symptoms with this. ?  ?CAD with chest pain ?Recently admitted for NSTEMI at [facility] and cardiac catheterization done 3/27 revealed severe three-vessel disease. ?Not a candidate for CABG.   ?Not a  candidate for PCI due to AKI, acute on chronic anemia, and thrombocytopenia.   ?Not a candidate for antiplatelets given thrombocytopenia ?High-sensitivity troponin remains elevated (1749 > 1865 > 1850).  ?Patient is tolerating Imdur and low-dose metoprolol well clinically. ?Metoprolol can potentially be changed to carvedilol if warranted per Dr. Corbin Ade given decrease portal pressures that can be achieved with carvedilol. ?It is unclear to me if patient understands significance of ongoing elevation of troponin and our inability to provide any disease modifying agents due to multiple comorbidities. ?Patient will see cardiology in consultation after discharge per their request. ?  ?Acute on chronic anemia, possibly secondary to blood loss although ?Patient has responded well to 1 unit PRBC ?H&H have responded appropriately and have been stable on repeat ?Blood pressure also improved. ? EGD done in June 2022 showing grade 1 esophageal varices and portal hypertensive gastropathy. ?GI was consulted who wrote a wonderful note delineating difficulties with more aggressive treatment. ?Continue octreotide for 48 hours ?Convert IV PPI to oral PPI on discharge ? ?End stage liver disease ([place]) ?No longer a candidate for liver transplant given three-vessel CAD.  ?Patient underwent paracentesis this morning without any improvement in symptomatology. ?Continue rifaximin and lactulose ?Lasix and spironolactone can be restarted at home although patient will need creatinine checked within a couple of days after discharge. ?Patient is followed up closely with Dr. Corbin Ade as an outpatient ?  ?AKI (acute kidney injury) ([place]) ?Creatinine seems to have stabilized around 1.5. ?  ?Thrombocytopenia ([place]) ?Chronic and secondary to ESLD ? Unable to be treated with antiplatelet agents due to thrombocytopenia  ? Avoid anticoagulation ?   ?Hypothyroidism ?Continue Synthroid ?   ?OSA (obstructive sleep apnea) ?Continue nightly CPAP ?  ?Prognosis and  goals of care ?Multiple discussions with family about patient's extremely poor/grave prognosis were had by myself, cardiology, gastroenterology and palliative care.  Patient continues to express continued desire to get better.  Patient wants to continue to remain full code and declines referral to hospice or outpatient palliative care at present.  It is unclear to me if patient understands the gravity of his combination of medical conditions as below: ?Patient continues to have active CAD with elevated troponins however as delineated below, we have exhausted our therapeutic options.   ?Patient also has end-stage liver disease with probable hepatocellular carcinoma per MRI with LI-RADS 4. ? ?Disposition ?Patient is able to ambulate to the bathroom without assistive devices, has been evaluated by PT.   ?Patient states he does not want to "bounce back" however we discussed the likelihood of his being readmitted given  ongoing significant medical problems although we have no further therapeutic interventions available for him.   ? ?Discharge diagnosis   ? ? ?Principal Problem: ?  Chest pain ?Active Problems: ?  Acute on chronic anemia ?  AKI (acute kidney injury) ([place]) ?  End stage liver disease ([place]) ?  Thrombocytopenia ([place]) ?  OSA (obstructive sleep apnea) ?  HLD (hyperlipidemia) ?  Hypothyroidism ?  Goals of care, counseling/discussion ? ? ? ?Discharge instructions   ? ?Discharge Instructions   ? ? Diet - low sodium heart healthy   Complete by: As directed ?  ? Increase activity slowly   Complete by: As directed ?  ? ?  ? ? ?Discharge Medications  ? ?Allergies as of 07/20/2021   ?No Known Allergies ?  ? ?  ?Medication List  ?  ? ?STOP taking these medications   ? ?amoxicillin-clavulanate 875-125 MG tablet ?Commonly known as: AUGMENTIN ?  ?zinc gluconate 50 MG tablet ?  ? ?  ? ?TAKE these medications   ? ?atorvastatin 20 MG tablet ?Commonly known as: LIPITOR ?Take 20 mg by mouth at bedtime. ?  ?furosemide 20 MG  tablet ?Commonly known as: LASIX ?Take 20 mg by mouth daily. ?  ?isosorbide mononitrate 30 MG 24 hr tablet ?Commonly known as: IMDUR ?Take 1 tablet (30 mg total) by mouth daily for 15 days. ?Start taking on

## 2021-07-20 NOTE — Progress Notes (Signed)
Patient's heart rate dropped to 39 nonsustained overnight with heart rate sustaining in the 40s-50s most of the night.  Patient denied any c/o.  BP remained stable. ?

## 2021-07-20 NOTE — Progress Notes (Signed)
? ? ?Progress Note ? ? Subjective  ?Patient denies complaint this morning ?Patient reports he did have a large bowel movement yesterday, the "best of had in several days" which was dark brown but not black and not bloody. ?Denies abdominal pain ? ? Objective  ?Vital signs in last 24 hours: ?Temp:  [97.5 ?F (36.4 ?C)-98.7 ?F (37.1 ?C)] 98 ?F (36.7 ?C) (04/03 1108) ?Pulse Rate:  [42-67] 54 (04/03 1108) ?Resp:  [11-21] 18 (04/03 1108) ?BP: (85-132)/(49-94) 132/61 (04/03 1108) ?SpO2:  [97 %-100 %] 99 % (04/03 1108) ?Last BM Date : 07/19/21 ? ?Gen: awake, alert, NAD ?HEENT: anicteric ?CV: Bradycardic ?Pulm: CTA b/l ?Abd: soft, NT, mildly distended without being tense or firm, +BS throughout ?Ext: no c/c, 1+ bilateral lower extremity edema ?Neuro: nonfocal, no asterixis, mildly slow mentation ? ? ?Intake/Output from previous day: ?04/02 0701 - 04/03 0700 ?In: 1037.2 [P.O.:480; I.V.:557.2] ?Out: 450 [Urine:450] ?Intake/Output this shift: ?No intake/output data recorded. ? ?Lab Results: ?Recent Labs  ?  07/17/21 ?1334 07/17/21 ?2213 07/19/21 ?0250 07/19/21 ?1045 07/20/21 ?0159  ?WBC 5.5  --  3.5*  --  3.5*  ?HGB 6.8*   < > 8.3* 8.3* 8.7*  ?HCT 20.0*   < > 24.7* 24.1* 26.2*  ?PLT 52*  --  38*  --  40*  ? < > = values in this interval not displayed.  ? ?BMET ?Recent Labs  ?  07/19/21 ?0250 07/20/21 ?0159  ?NA 138 134*  ?K 4.1 3.7  ?CL 103 104  ?CO2 31 26  ?GLUCOSE 103* 108*  ?BUN 42* 28*  ?CREATININE 1.58* 1.43*  ?CALCIUM 8.2* 7.7*  ? ?LFT ?Recent Labs  ?  07/20/21 ?0159  ?PROT 4.0*  ?ALBUMIN 1.9*  ?AST 86*  ?ALT 45*  ?ALKPHOS 60  ?BILITOT 5.0*  ? ?PT/INR ?Recent Labs  ?  07/18/21 ?1623  ?LABPROT 26.9*  ?INR 2.5*  ? ?MELD-Na score: 27 at 07/20/2021  1:59 AM ?MELD score: 26 at 07/20/2021  1:59 AM ?Calculated from: ?Serum Creatinine: 1.43 mg/dL at 07/20/2021  1:59 AM ?Serum Sodium: 134 mmol/L at 07/20/2021  1:59 AM ?Total Bilirubin: 5.0 mg/dL at 07/20/2021  1:59 AM ?INR(ratio): 2.5 at 07/18/2021  4:23 PM ?Age: 71  years ? ? ?Studies/Results: ?US Paracentesis ? ?Result Date: 07/19/2021 ?INDICATION: Patient with a history of end-stage liver disease presents today with ascites. Interventional radiology asked to perform a diagnostic and therapeutic paracentesis. EXAM: ULTRASOUND GUIDED PARACENTESIS MEDICATIONS: 1% lidocaine 10 mL COMPLICATIONS: None immediate. PROCEDURE: Informed written consent was obtained from the patient after a discussion of the risks, benefits and alternatives to treatment. A timeout was performed prior to the initiation of the procedure. Initial ultrasound scanning demonstrates a large amount of ascites within the right lower abdominal quadrant. The right lower abdomen was prepped and draped in the usual sterile fashion. 1% lidocaine was used for local anesthesia. Following this, a 19 gauge, 7-cm, Yueh catheter was introduced. An ultrasound image was saved for documentation purposes. The paracentesis was performed. The catheter was removed and a dressing was applied. The patient tolerated the procedure well without immediate post procedural complication. FINDINGS: A total of approximately 1.3 L of clear yellow fluid was removed. Samples were sent to the laboratory as requested by the clinical team. IMPRESSION: Successful ultrasound-guided paracentesis yielding 1.3 liters of peritoneal fluid. Read by: Soyla Dryer, NP PLAN: The patient has required >/=2 paracenteses in a 30 day period and a formal evaluation by the [facility] has been arranged.  Electronically Signed   By: Lucrezia Europe M.D.   On: 07/19/2021 12:26   ? ? ? Assessment & Recommendations  ?71 year old with decompensated cirrhosis manifested by ascites, hepatic encephalopathy, prior varices without bleeding, coagulopathy and high MELD score (today 27), likely new diagnosis of HCC by MRI, severe three-vessel nonoperative CAD, acute on chronic anemia. ? ?1.  Decompensated cirrhosis with portal  hypertension/new diagnosis HCC/ascites/acute on chronic anemia/hepatic encephalopathy --difficult situation overall as management of his significant liver disease is complicated by his severe CAD without option for intervention.  He has been seen at [facility] hepatology and they did not feel he was a candidate for liver transplantation.  Plan per hospitalist medicine is for discharge today.  He certainly needs to follow-up with Dr. Laural Golden shortly after discharge.  Recommendations as follows: ?--Ascites: No SBP on diagnostic tap here; renal function has stabilized; can start back low-dose diuretic (would start with half of his usual home dose).  Close attention as an outpatient to renal function as well as sodium, both of which could preclude the use of diuretics.  If ascites becomes symptomatic and diuretics ineffective he will need LVP with IV albumin as an outpatient ?--Hepatic encephalopathy: Continue lactulose titrated to 2-3 soft formed stools daily and rifaximin 550 mg twice daily ?--Variceal screening --known small varices at last EGD; EGD not recommended at this time due to high risk nature of procedure and lack of overt bleeding; Future EGD reserved for overt hemorrhage; would recommend cardiology if able to tolerate changing metoprolol to carvedilol (carvedilol provides more lowering of portal pressures as compared to metoprolol) ?--Twice daily PPI ?--Close follow-up with Dr. Laural Golden and primary care ?--[facility]: Diagnostic by MRI; not a transplant candidate as discussed above; depending on overall management of multiple medical comorbidity including severe CAD, could consider IR consultation or oncology evaluation as an outpatient ? ?2.  CAD --multivessel without target for PCI and not a surgical candidate.  Medical management per cardiology. ? ?Given patient is being discharged home, GI will sign off, please call if questions ? ? ? ? ? LOS: 3 days  ? ?Lajuan Lines Aylssa Herrig  07/20/2021, 11:20 AM ?See Shea Evans, [facility], to  contact our on call provider ? ? ?

## 2021-07-20 NOTE — TOC Initial Note (Signed)
Transition of Care (TOC) - Initial/Assessment Note  ? ? ?Patient Details  ?Name: Cody Austin ?MRN: [id number] ?Date of Birth: 29-Oct-1950 ? ?Transition of Care (TOC) CM/SW Contact:    ?Graves-Bigelow, Ocie Cornfield, RN ?Phone Number: ?07/20/2021, 1:57 PM ? ?Clinical Narrative: Case Manager spoke with patients daughter at the bedside- agreeable to home health services with [place]. Previous Case Manager had called the referral in. Case Manager discussed outpatient palliative services and family is agreeable- Case Manager called [facility] and spoke with Stephanie-office to call the patient with visit times. No DME needed at this time. Plan for patient to transition home today. No further needs identified.            ? ? ?Expected Discharge Plan: [place] ?Barriers to Discharge: No Barriers Identified ? ? ?Patient Goals and CMS Choice ?Patient states their goals for this hospitalization and ongoing recovery are:: to return home ?  ?Choice offered to / list presented to : Patient, Adult Children ? ?Expected Discharge Plan and Services ?Expected Discharge Plan: [place] ?  ?Discharge Planning Services: CM Consult ?Post Acute Care Choice: Home Health ?Living arrangements for the past 2 months: [place] ?Expected Discharge Date: 07/20/21               ?  ?DME Agency: NA ?  ?  ?  ?  ?[place] Agency: [facility] ?Date HH Agency Contacted: 07/20/21 ?Time [place]: 1356 ?Representative spoke with at [place]: [place] ? ?Prior Living Arrangements/Services ?Living arrangements for the past 2 months: [place] ?Lives with:: Spouse ?Patient language and need for interpreter reviewed:: Yes ?Do you feel safe going back to the place where you live?: Yes      ?Need for Family Participation in Patient Care: Yes (Comment) ?Care giver support system in place?: Yes (comment) ?  ?Criminal Activity/Legal Involvement Pertinent to Current Situation/Hospitalization: No -  Comment as needed ? ?Activities of Daily Living ?Home Assistive Devices/Equipment: None ?ADL Screening (condition at time of admission) ?Patient's cognitive ability adequate to safely complete daily activities?: No ?Is the patient deaf or have difficulty hearing?: Yes ?Does the patient have difficulty seeing, even when wearing glasses/contacts?: No ?Does the patient have difficulty concentrating, remembering, or making decisions?: No ?Patient able to express need for assistance with ADLs?: No ?Does the patient have difficulty dressing or bathing?: No ?Independently performs ADLs?: Yes (appropriate for developmental age) ?Does the patient have difficulty walking or climbing stairs?: No ?Weakness of Legs: None ?Weakness of Arms/Hands: None ? ?Permission Sought/Granted ?Permission sought to share information with : Family Supports, Customer service manager, Case Manager ?Permission granted to share information with : Yes, Verbal Permission Granted ?   ? Permission granted to share info w AGENCY: [place] ?   ?   ? ?Emotional Assessment ?Appearance:: Appears stated age ?Attitude/Demeanor/Rapport: Engaged ?Affect (typically observed): Appropriate ?  ?Alcohol / Substance Use: Not Applicable ?Psych Involvement: No (comment) ? ?Admission diagnosis:  NSTEMI (non-ST elevated myocardial infarction) ([place]) [I21.4] ?Chest pain [R07.9] ?Anemia, unspecified type [D64.9] ?Patient Active Problem List  ? Diagnosis Date Noted  ? AKI (acute kidney injury) ([place]) 07/17/2021  ? End stage liver disease ([place]) 07/17/2021  ? Hypothyroidism 07/17/2021  ? Goals of care, counseling/discussion 07/17/2021  ? Acute on chronic anemia   ? NSTEMI (non-ST elevated myocardial infarction) ([place])   ? NAFLD (nonalcoholic fatty liver disease)   ? Thrombocytopenia ([place])   ?  Coronary artery disease   ? Abnormal EKG   ? Aortic valve stenosis   ? Chest pain 07/16/2021  ? Ascites 07/02/2021  ? Anemia, chronic disease 06/18/2019   ? Encephalopathy, hepatic ([place]) 06/01/2019  ? NASH Liver cirrhosis secondary to NASH (nonalcoholic steatohepatitis) 06/01/2019  ? H/o COVID-19 Virus infection--- Dxed [place] 2020--- He is Not currently symptomatic 06/01/2019  ? Acute encephalopathy 05/28/2019  ? Peripheral arterial disease ([place]) 05/28/2019  ? OSA (obstructive sleep apnea) 05/28/2019  ? HLD (hyperlipidemia) 05/28/2019  ? Pancreatic cyst 11/20/2018  ? Pre-diabetes 11/19/2014  ? Obesity 11/19/2014  ? Hepatic cirrhosis ([place]) 11/19/2014  ? Cirrhosis, nonalcoholic ([place]) 04/88/8916  ? Other pancytopenia ([place]) 11/19/2014  ? ?PCP:  [name], MD ?Pharmacy:   ?[facility] [address] ?[place] ?[place] [address] ?Phone: [phone] Fax: [phone] ? ?Readmission Risk Interventions ?   ? View : No data to display.  ?  ?  ?  ? ? ? ?

## 2021-07-20 NOTE — Progress Notes (Signed)
Discharge instructions explained. Follow up appointments and next medication administration times reviewed by Rosalyn Gess SWOT RN. Patient verbalized having understanding of discharge instructions. IV removed. All belongings in the patient' possession at the time of discharging.  ?

## 2021-07-20 NOTE — Progress Notes (Signed)
Patient Cody Austin      DOB: 12-10-1950      YZJ:[id number] ? ? ? ?  ?Palliative Medicine Team ? ? ? ?Subjective: Bedside symptom check. No family present at this time. Patient discussing plan of care with Dr.Pyrtle and Dr. Jamse Arn.  ? ? ?Physical exam: Patient denies any pain or discomfort at this time. Breathing is even and non-labored. Hearing aides applied to assist in participation in conversation. No physical or non-verbal signs of pain or discomfort noted. Patient looking much more alert and good coloring today compared to visit Friday. Patient states he is feeling good.  ? ? ?Assessment and plan: Patient plans to discharge home today with outpatient palliative services. Reference material provided bedside. Patient does not have any needs, was calling to place breakfast order and let daughter [name] know of plan to go home today and to follow up with GI Dr. Matthew Saras outpatient. This RN communicated plan to bedside RN, in agreement without any concerns or needs identified. Will continue to follow for any changes or advances. ? ? ? ?Thank you for allowing the Palliative Medicine Team to assist in the care of this patient. ?  ?  ?Damian Leavell, MSN, RN ?Palliative Medicine Team ?Team Phone: [phone]  ?This phone is monitored 7a-7p, please reach out to attending physician outside of these hours for urgent needs.   ?

## 2021-07-21 DIAGNOSIS — I2510 Atherosclerotic heart disease of native coronary artery without angina pectoris: Secondary | ICD-10-CM | POA: Diagnosis not present

## 2021-07-21 DIAGNOSIS — N179 Acute kidney failure, unspecified: Secondary | ICD-10-CM | POA: Diagnosis not present

## 2021-07-21 DIAGNOSIS — E039 Hypothyroidism, unspecified: Secondary | ICD-10-CM | POA: Diagnosis not present

## 2021-07-21 DIAGNOSIS — Z9989 Dependence on other enabling machines and devices: Secondary | ICD-10-CM | POA: Diagnosis not present

## 2021-07-21 DIAGNOSIS — I350 Nonrheumatic aortic (valve) stenosis: Secondary | ICD-10-CM | POA: Diagnosis not present

## 2021-07-21 DIAGNOSIS — D649 Anemia, unspecified: Secondary | ICD-10-CM | POA: Diagnosis not present

## 2021-07-21 DIAGNOSIS — K7210 Chronic hepatic failure without coma: Secondary | ICD-10-CM | POA: Diagnosis not present

## 2021-07-21 DIAGNOSIS — G4733 Obstructive sleep apnea (adult) (pediatric): Secondary | ICD-10-CM | POA: Diagnosis not present

## 2021-07-21 DIAGNOSIS — E785 Hyperlipidemia, unspecified: Secondary | ICD-10-CM | POA: Diagnosis not present

## 2021-07-21 DIAGNOSIS — D696 Thrombocytopenia, unspecified: Secondary | ICD-10-CM | POA: Diagnosis not present

## 2021-07-21 DIAGNOSIS — R188 Other ascites: Secondary | ICD-10-CM | POA: Diagnosis not present

## 2021-07-21 DIAGNOSIS — K7581 Nonalcoholic steatohepatitis (NASH): Secondary | ICD-10-CM | POA: Diagnosis not present

## 2021-07-21 DIAGNOSIS — R079 Chest pain, unspecified: Secondary | ICD-10-CM | POA: Diagnosis not present

## 2021-07-21 DIAGNOSIS — I214 Non-ST elevation (NSTEMI) myocardial infarction: Secondary | ICD-10-CM | POA: Diagnosis not present

## 2021-07-21 DIAGNOSIS — I739 Peripheral vascular disease, unspecified: Secondary | ICD-10-CM | POA: Diagnosis not present

## 2021-07-22 ENCOUNTER — Telehealth (INDEPENDENT_AMBULATORY_CARE_PROVIDER_SITE_OTHER): Payer: Self-pay | Admitting: *Deleted

## 2021-07-22 DIAGNOSIS — I214 Non-ST elevation (NSTEMI) myocardial infarction: Secondary | ICD-10-CM | POA: Diagnosis not present

## 2021-07-22 DIAGNOSIS — I739 Peripheral vascular disease, unspecified: Secondary | ICD-10-CM | POA: Diagnosis not present

## 2021-07-22 DIAGNOSIS — K7581 Nonalcoholic steatohepatitis (NASH): Secondary | ICD-10-CM | POA: Diagnosis not present

## 2021-07-22 DIAGNOSIS — K7210 Chronic hepatic failure without coma: Secondary | ICD-10-CM | POA: Diagnosis not present

## 2021-07-22 DIAGNOSIS — I350 Nonrheumatic aortic (valve) stenosis: Secondary | ICD-10-CM | POA: Diagnosis not present

## 2021-07-22 DIAGNOSIS — D649 Anemia, unspecified: Secondary | ICD-10-CM | POA: Diagnosis not present

## 2021-07-22 NOTE — Telephone Encounter (Signed)
error 

## 2021-07-22 NOTE — Telephone Encounter (Signed)
Will occupational therapist with [facility] calling to get verbal orders. He completed the OT therapy evaluation as ordered and recommends one time a week for 3 weeks. May leave verbal orders on his secure voicemail [phone].  ?

## 2021-07-23 ENCOUNTER — Ambulatory Visit (INDEPENDENT_AMBULATORY_CARE_PROVIDER_SITE_OTHER): Payer: Medicare Other | Admitting: Internal Medicine

## 2021-07-23 NOTE — Telephone Encounter (Signed)
Will notified Dr. Laural Golden will be back in office on Tuesday next week. He states that will be ok.  ?

## 2021-07-27 LAB — CULTURE, BODY FLUID W GRAM STAIN -BOTTLE: Culture: NO GROWTH

## 2021-07-28 ENCOUNTER — Ambulatory Visit (INDEPENDENT_AMBULATORY_CARE_PROVIDER_SITE_OTHER): Payer: Medicare Other | Admitting: Internal Medicine

## 2021-07-28 ENCOUNTER — Encounter (INDEPENDENT_AMBULATORY_CARE_PROVIDER_SITE_OTHER): Payer: Self-pay | Admitting: Internal Medicine

## 2021-07-28 VITALS — BP 104/58 | Temp 98.7°F | Ht 67.0 in | Wt 213.9 lb

## 2021-07-28 DIAGNOSIS — K7210 Chronic hepatic failure without coma: Secondary | ICD-10-CM | POA: Diagnosis not present

## 2021-07-28 DIAGNOSIS — R188 Other ascites: Secondary | ICD-10-CM

## 2021-07-28 DIAGNOSIS — K7460 Unspecified cirrhosis of liver: Secondary | ICD-10-CM

## 2021-07-28 DIAGNOSIS — E039 Hypothyroidism, unspecified: Secondary | ICD-10-CM | POA: Diagnosis not present

## 2021-07-28 DIAGNOSIS — I350 Nonrheumatic aortic (valve) stenosis: Secondary | ICD-10-CM | POA: Diagnosis not present

## 2021-07-28 DIAGNOSIS — D649 Anemia, unspecified: Secondary | ICD-10-CM | POA: Diagnosis not present

## 2021-07-28 DIAGNOSIS — K7682 Hepatic encephalopathy: Secondary | ICD-10-CM

## 2021-07-28 DIAGNOSIS — I739 Peripheral vascular disease, unspecified: Secondary | ICD-10-CM | POA: Diagnosis not present

## 2021-07-28 DIAGNOSIS — I214 Non-ST elevation (NSTEMI) myocardial infarction: Secondary | ICD-10-CM | POA: Diagnosis not present

## 2021-07-28 DIAGNOSIS — K7581 Nonalcoholic steatohepatitis (NASH): Secondary | ICD-10-CM | POA: Diagnosis not present

## 2021-07-28 MED ORDER — PANTOPRAZOLE SODIUM 40 MG PO TBEC: 40.0000 mg | DELAYED_RELEASE_TABLET | Freq: Every day | ORAL | 5 refills | Status: DC

## 2021-07-28 MED ORDER — ISOSORBIDE MONONITRATE ER 30 MG PO TB24: 30.0000 mg | ORAL_TABLET | Freq: Every day | ORAL | 5 refills | Status: DC

## 2021-07-28 MED ORDER — METOPROLOL TARTRATE 25 MG PO TABS: 12.5000 mg | ORAL_TABLET | Freq: Two times a day (BID) | ORAL | 1 refills | Status: DC

## 2021-07-28 NOTE — Patient Instructions (Signed)
Physician will call with results of blood test when completed ?Dietary consultation ?Please call office if you feel ascites has increased. ?

## 2021-07-28 NOTE — Progress Notes (Signed)
Presenting complaint; ? ?Follow-up for decompensated cirrhosis and anemia. ? ?Database and subjective: ? ?Patient is 71 year old Caucasian male who is here for scheduled visit accompanied by his wife and their son.  Patient was last seen by me on 07/02/2021. ?Patient was diagnosed with cirrhosis secondary to NASH in 2016.  He remained well compensated until he developed COVID about 2 years ago.  He developed hepatic encephalopathy which is easily controlled with therapy.  About 2 months ago he was noted to have rising serum bilirubin.  Prior ultrasound in December 2022 did not show any lesion in the cirrhotic liver.  MRI was obtained on 06/11/2021 revealing hyperenhancing 12 mm lesion in segment VII/VIII LI-RADS 4 and a second lesion measuring 15 mm with delayed enhancement LI-RADS 3.  He also had 52m cystic lesion in the head of pancreas which is stable most likely sidebranch IPMN. ?[facility]. ?With this new finding of liver lesions he was referred back and he was hospitalized.  Patient underwent abdominal paracenteses and received IV albumin after which she developed MI.  He underwent cardiac cath which revealed triple-vessel disease and he was felt not to be a candidate for liver transplant.  [facility] did not have any recommendations regarding liver lesions.  Patient was discharged on 07/15/2021.  [facility].  Patient was felt not to be candidate for intervention on account of underlying liver disease.  Medical therapy was optimized.  His hemoglobin was around 7.2 on admission.  He received 1 unit of PRBCs and his hemoglobin dropped below 7.  He received a total of 4 units of PRBCs during this admission.  He did give history of tarry stool prior to admission but did not have melena or rectal bleeding during hospitalization.  His stool was heme positive.  [facility]  He was initially treated with octreotide which was tapered and discontinued.  They recommended observation and consideration of EGD at a later date. ? ?Patient has not had any more episodes of chest pain or shortness of breath.  According to his son he did have transient confusion during recent hospitalization but he has done fine since then.  He is taking lactulose and Xifaxan as prescribed.  He is having 3 bowel movements per day.  Stool now is brown.  He denies abdominal pain.  He does not feel ascites has reaccumulated to the point that he needs abdominal tap.  He did have 1.3 L of ascitic fluid removed on 07/20/2021. ?Patient is driving locally.  He would like to know what is to be expected with this illness. ?He presently does not have a primary care physician.  Dr. GHilma Favorswas his PCP until recently. ? ?Current Medications: ?Outpatient Encounter Medications as of 07/28/2021  ?Medication Sig  ? atorvastatin (LIPITOR) 20 MG tablet Take 20 mg by mouth at bedtime.   ? furosemide (LASIX) 20 MG tablet Take 20 mg by mouth daily.  ? isosorbide mononitrate (IMDUR) 30 MG 24 hr tablet Take 1 tablet (30 mg total) by mouth daily for 15 days.  ? lactulose (CHRONULAC) 10 GM/15ML solution Take 30 mLs (20 g total) by mouth 3 (three) times daily. Goal is 2 bowel movements daily.  ? Levothyroxine Sodium 50 MCG CAPS Take 50 mcg by mouth daily before breakfast.  ? metoprolol tartrate (LOPRESSOR) 25 MG tablet Take 0.5 tablets (12.5 mg  total) by mouth 2 (two) times daily for 15 days.  ? Omega-3 Fatty Acids (OMEGA 3 500 PO) Take 500 mg by mouth at bedtime.  ? pantoprazole (PROTONIX) 40 MG tablet Take 1 tablet (40 mg total) by mouth daily for 15 days.  ? rifaximin (XIFAXAN) 550 MG TABS tablet Take 1 tablet (550 mg total) by mouth 2 (two) times daily. (Patient taking differently: Take 550 mg by mouth 2 (two) times daily. Continuously)  ? sucralfate (CARAFATE) 1 g tablet Take 1 tablet (1 g total) by mouth 2 (two) times daily for 15  days.  ? ?No facility-administered encounter medications on file as of 07/28/2021.  ? ? ? ?Objective: ?Blood pressure (!) 104/58, temperature 98.7 ?F (37.1 ?C), temperature source Oral, height 5' 7"  (1.702 m), weight 213 lb 14.4 oz (97 kg). ?Patient is alert and appears pale. ?Conjunctiva is pink. Sclera does not appear to be yellow in artificial light. ?Oropharyngeal mucosa is normal. ?No neck masses or thyromegaly noted. ?Cardiac exam with regular rhythm normal S1 and S2.  He has grade 2/6 to 3/6 systolic murmur best heard at aortic area. ?Lungs are clear to auscultation. ?Abdomen is full but not distended or tense.  He has few small ecchymosis over his lower abdomen on the right side.  Shifting dullness is present.  Liver spleen not palpable. ?He has 2+ pitting edema involving both legs.  He has superficial ulcer over left shin without any drainage. ? ?Labs/studies Results: ? ? ? ?  Latest Ref Rng & Units 07/20/2021  ?  1:59 AM 07/19/2021  ? 10:45 AM 07/19/2021  ?  2:50 AM  ?CBC  ?WBC 4.0 - 10.5 K/uL 3.5    3.5    ?Hemoglobin 13.0 - 17.0 g/dL 8.7   8.3   8.3    ?Hematocrit 39.0 - 52.0 % 26.2   24.1   24.7    ?Platelets 150 - 400 K/uL 40    38    ?  ? ?  Latest Ref Rng & Units 07/20/2021  ?  1:59 AM 07/19/2021  ?  2:50 AM 07/17/2021  ?  2:15 AM  ?CMP  ?Glucose 70 - 99 mg/dL 108   103   95    ?BUN 8 - 23 mg/dL 28   42   51    ?Creatinine 0.61 - 1.24 mg/dL 1.43   1.58   1.57    ?Sodium 135 - 145 mmol/L 134   138   132    ?Potassium 3.5 - 5.1 mmol/L 3.7   4.1   4.3    ?Chloride 98 - 111 mmol/L 104   103   99    ?CO2 22 - 32 mmol/L 26   31   28     ?Calcium 8.9 - 10.3 mg/dL 7.7   8.2   8.6    ?Total Protein 6.5 - 8.1 g/dL 4.0   3.9   4.2    ?Total Bilirubin 0.3 - 1.2 mg/dL 5.0   4.9   6.5    ?Alkaline Phos 38 - 126 U/L 60   64   61    ?AST 15 - 41 U/L 86   74   64    ?ALT 0 - 44 U/L 45   41   36    ?  ? ?  Latest Ref Rng & Units 07/20/2021  ?  1:59 AM 07/19/2021  ?  2:50 AM 07/17/2021  ?  2:15 AM  ?Hepatic Function  ?Total  Protein 6.5  - 8.1 g/dL 4.0   3.9   4.2    ?Albumin 3.5 - 5.0 g/dL 1.9   1.9   2.0    ?AST 15 - 41 U/L 86   74   64    ?ALT 0 - 44 U/L 45   41   36    ?Alk Phosphatase 38 - 126 U/L 60   64   61    ?Total Bilirubin 0.3 - 1.2 mg/dL 5.0   4.9   6.5    ?Bilirubin, Direct 0.0 - 0.2 mg/dL   1.8    ?  ? ? ?Assessment: ? ?#1.  Cirrhosis secondary to NASH diagnosed in 2016 now with multiple complications.  He unfortunately is not a transplant candidate given triple-vessel coronary artery disease. ? ?#2.  Hepatic encephalopathy.  He appears to be at his baseline.  He will continue lactulose and Xifaxan as before.  Goal is for him to have at least 3 bowel movements per day.  Patient encouraged not to drive.  If he has to it should be local and during the daytime and also when his family members given permission to drive. ? ?#3.  Acute on chronic anemia.  He required 4 units of PRBCs during recent hospitalization.  His first abdominal tap was blood-tinged.  It appears he may have experienced melena prior to recent hospitalization at [facility].  He is on PPI.  Last EGD was in June 2022 revealing grade 1 esophageal varices.  He certainly could have developed peptic ulcer disease given acute on chronic illness.  He is presently not having any symptoms suggest peptic ulcer disease.  We will continue PPI and monitor clinical course.  If hemoglobin drops again would consider esophagogastroduodenoscopy.  He can discontinue sucralfate. ? ?#4.  2 hepatic lesions concerning for [place].  1 lesion is LI-RADS 4 and the second lesion is LI-RADS 3.  With marginal hepatic function and elevated bilirubin he may not be a candidate for palliative chemotherapy.  However I will request Dr. Delton Coombes to look at the data and see if it be worthwhile to review options. ? ?#5.  Ascites.  Last abdominal tap was 8 days ago with removal of 1300 mL of ascitic fluid.  He is on low-dose diuretic therapy.  Given underlying renal dysfunction we cannot be aggressive on  diuretic therapy as aggressive therapy may trigger hepatic encephalopathy. ? ?#6.  Coronary artery disease.  He has three-vessel disease.  He is not a candidate for surgical therapy.  He will run out of

## 2021-07-29 ENCOUNTER — Other Ambulatory Visit (INDEPENDENT_AMBULATORY_CARE_PROVIDER_SITE_OTHER): Payer: Self-pay | Admitting: Internal Medicine

## 2021-07-29 DIAGNOSIS — I214 Non-ST elevation (NSTEMI) myocardial infarction: Secondary | ICD-10-CM | POA: Diagnosis not present

## 2021-07-29 DIAGNOSIS — K7581 Nonalcoholic steatohepatitis (NASH): Secondary | ICD-10-CM | POA: Diagnosis not present

## 2021-07-29 DIAGNOSIS — K7210 Chronic hepatic failure without coma: Secondary | ICD-10-CM | POA: Diagnosis not present

## 2021-07-29 DIAGNOSIS — D649 Anemia, unspecified: Secondary | ICD-10-CM | POA: Diagnosis not present

## 2021-07-29 DIAGNOSIS — I350 Nonrheumatic aortic (valve) stenosis: Secondary | ICD-10-CM | POA: Diagnosis not present

## 2021-07-29 DIAGNOSIS — I739 Peripheral vascular disease, unspecified: Secondary | ICD-10-CM | POA: Diagnosis not present

## 2021-07-29 LAB — CBC
HCT: 27 % — ABNORMAL LOW (ref 38.5–50.0)
Hemoglobin: 9.1 g/dL — ABNORMAL LOW (ref 13.2–17.1)
MCH: 34 pg — ABNORMAL HIGH (ref 27.0–33.0)
MCHC: 33.7 g/dL (ref 32.0–36.0)
MCV: 100.7 fL — ABNORMAL HIGH (ref 80.0–100.0)
MPV: 12.2 fL (ref 7.5–12.5)
Platelets: 56 10*3/uL — ABNORMAL LOW (ref 140–400)
RBC: 2.68 10*6/uL — ABNORMAL LOW (ref 4.20–5.80)
RDW: 17.1 % — ABNORMAL HIGH (ref 11.0–15.0)
WBC: 4.5 10*3/uL (ref 3.8–10.8)

## 2021-07-29 LAB — COMPREHENSIVE METABOLIC PANEL
AG Ratio: 1 (calc) (ref 1.0–2.5)
ALT: 37 U/L (ref 9–46)
AST: 64 U/L — ABNORMAL HIGH (ref 10–35)
Albumin: 2.2 g/dL — ABNORMAL LOW (ref 3.6–5.1)
Alkaline phosphatase (APISO): 95 U/L (ref 35–144)
BUN/Creatinine Ratio: 17 (calc) (ref 6–22)
BUN: 27 mg/dL — ABNORMAL HIGH (ref 7–25)
CO2: 28 mmol/L (ref 20–32)
Calcium: 8.2 mg/dL — ABNORMAL LOW (ref 8.6–10.3)
Chloride: 102 mmol/L (ref 98–110)
Creat: 1.56 mg/dL — ABNORMAL HIGH (ref 0.70–1.28)
Globulin: 2.2 g/dL (calc) (ref 1.9–3.7)
Glucose, Bld: 93 mg/dL (ref 65–139)
Potassium: 4.2 mmol/L (ref 3.5–5.3)
Sodium: 136 mmol/L (ref 135–146)
Total Bilirubin: 5.7 mg/dL — ABNORMAL HIGH (ref 0.2–1.2)
Total Protein: 4.4 g/dL — ABNORMAL LOW (ref 6.1–8.1)

## 2021-07-29 LAB — TSH: TSH: 4.9 mIU/L — ABNORMAL HIGH (ref 0.40–4.50)

## 2021-07-29 LAB — PROTIME-INR
INR: 1.8 — ABNORMAL HIGH
Prothrombin Time: 17.5 s — ABNORMAL HIGH (ref 9.0–11.5)

## 2021-07-29 MED ORDER — LEVOTHYROXINE SODIUM 25 MCG PO TABS: 12.5000 ug | ORAL_TABLET | Freq: Every day | ORAL | 0 refills | Status: DC

## 2021-07-29 NOTE — Telephone Encounter (Signed)
Dr. Laural Golden saw patient yesterday and Dr. Laural Golden was given orders for OT and PT that was faxed in.  ?

## 2021-07-30 ENCOUNTER — Ambulatory Visit (INDEPENDENT_AMBULATORY_CARE_PROVIDER_SITE_OTHER): Payer: Medicare Other | Admitting: Internal Medicine

## 2021-08-03 ENCOUNTER — Telehealth (INDEPENDENT_AMBULATORY_CARE_PROVIDER_SITE_OTHER): Payer: Self-pay | Admitting: *Deleted

## 2021-08-03 DIAGNOSIS — I350 Nonrheumatic aortic (valve) stenosis: Secondary | ICD-10-CM | POA: Diagnosis not present

## 2021-08-03 DIAGNOSIS — K7210 Chronic hepatic failure without coma: Secondary | ICD-10-CM | POA: Diagnosis not present

## 2021-08-03 DIAGNOSIS — D649 Anemia, unspecified: Secondary | ICD-10-CM | POA: Diagnosis not present

## 2021-08-03 DIAGNOSIS — K7581 Nonalcoholic steatohepatitis (NASH): Secondary | ICD-10-CM | POA: Diagnosis not present

## 2021-08-03 DIAGNOSIS — I739 Peripheral vascular disease, unspecified: Secondary | ICD-10-CM | POA: Diagnosis not present

## 2021-08-03 DIAGNOSIS — I214 Non-ST elevation (NSTEMI) myocardial infarction: Secondary | ICD-10-CM | POA: Diagnosis not present

## 2021-08-03 NOTE — Telephone Encounter (Signed)
Cody Austin from [facility] calling to report HR 46-48 today during OT visit. Calling because it is out of their parameters of below 50.  ? ?Cody Austin's number if questions [phone] ?If making order changes call branch office at [phone] ?

## 2021-08-04 DIAGNOSIS — D649 Anemia, unspecified: Secondary | ICD-10-CM | POA: Diagnosis not present

## 2021-08-04 DIAGNOSIS — K7210 Chronic hepatic failure without coma: Secondary | ICD-10-CM | POA: Diagnosis not present

## 2021-08-04 DIAGNOSIS — I214 Non-ST elevation (NSTEMI) myocardial infarction: Secondary | ICD-10-CM | POA: Diagnosis not present

## 2021-08-04 DIAGNOSIS — I350 Nonrheumatic aortic (valve) stenosis: Secondary | ICD-10-CM | POA: Diagnosis not present

## 2021-08-04 DIAGNOSIS — I739 Peripheral vascular disease, unspecified: Secondary | ICD-10-CM | POA: Diagnosis not present

## 2021-08-04 DIAGNOSIS — K7581 Nonalcoholic steatohepatitis (NASH): Secondary | ICD-10-CM | POA: Diagnosis not present

## 2021-08-05 DIAGNOSIS — K7210 Chronic hepatic failure without coma: Secondary | ICD-10-CM | POA: Diagnosis not present

## 2021-08-05 DIAGNOSIS — I214 Non-ST elevation (NSTEMI) myocardial infarction: Secondary | ICD-10-CM | POA: Diagnosis not present

## 2021-08-05 DIAGNOSIS — I350 Nonrheumatic aortic (valve) stenosis: Secondary | ICD-10-CM | POA: Diagnosis not present

## 2021-08-05 DIAGNOSIS — I739 Peripheral vascular disease, unspecified: Secondary | ICD-10-CM | POA: Diagnosis not present

## 2021-08-05 DIAGNOSIS — D649 Anemia, unspecified: Secondary | ICD-10-CM | POA: Diagnosis not present

## 2021-08-05 DIAGNOSIS — K7581 Nonalcoholic steatohepatitis (NASH): Secondary | ICD-10-CM | POA: Diagnosis not present

## 2021-08-05 NOTE — Telephone Encounter (Signed)
Per dr Laural Golden - no changes unless patient is having dizziness. Need to call and ask patient if he has been getting dizzy.  ?

## 2021-08-05 NOTE — Telephone Encounter (Signed)
I called Cody Austin from home health and let her know per dr Laural Golden no changes since patient is not having dizziness and pt is aware to call if he starts to have any dizziness.  ?

## 2021-08-05 NOTE — Telephone Encounter (Signed)
I called and spoke with patient and he states he is not having any dizziness.  ?

## 2021-08-06 ENCOUNTER — Other Ambulatory Visit (HOSPITAL_COMMUNITY): Payer: Self-pay

## 2021-08-06 DIAGNOSIS — I214 Non-ST elevation (NSTEMI) myocardial infarction: Secondary | ICD-10-CM

## 2021-08-09 NOTE — Progress Notes (Signed)
?Cardiology Office Note:   ? ?Date:  08/10/2021  ? ?ID:  Cody Austin, DOB [birth year], MRN [id number] ? ?PCP:  [name], MD ?  ?[facility] Providers ?Cardiologist:  None    ? ?Referring MD: [name], MD  ? ?CC:  Hospital follow up ? ?History of Present Illness:   ? ?Cody Austin is a 70 y.o. male with a hx of NSTEMI with 3VD not a candidate for intervention and [facility], Severe Anemia, NASH Cirrhosis, and severe aortic stenosis.   Due to his cirrhosis and anemia and his prior bleeds, he is not a candidate for procedural or surgical intervention.  Stifled conversations with PC through his eval. ? ?Patient notes that he is doing better. ?His MELD score has improved.  No further blood transfusions.    ? ?No chest pain or pressure .  No SOB/DOE and no PND/Orthopnea.  No weight gain or leg swelling.  No palpitations or syncope. Home health has seen one episode of asymptomatic bradycardia. ? ? ?Past Medical History:  ?Diagnosis Date  ? AKI (acute kidney injury) ([place]) 07/17/2021  ? Arthritis   ? Cirrhosis, non-alcoholic ([place])   ? Coronary artery disease   ? Fatty liver   ? Heart murmur   ? History of kidney stones   ? Hypothyroidism 07/17/2021  ? NASH Liver cirrhosis secondary to NASH (nonalcoholic steatohepatitis) 06/01/2019  ? NSTEMI (non-ST elevated myocardial infarction) ([place])   ? Other pancytopenia ([place]) 11/19/2014  ? Peripheral arterial disease ([place]) 05/28/2019  ? Sleep apnea   ? uses CPAP  ? ? ?Past Surgical History:  ?Procedure Laterality Date  ? CATARACT EXTRACTION W/PHACO Right 08/12/2014  ? Procedure: CATARACT EXTRACTION PHACO AND INTRAOCULAR LENS PLACEMENT (IOC);  Surgeon: [name], MD;  Location: [facility];  Service: Ophthalmology;  Laterality: Right;  CDE 9.32  ? CATARACT EXTRACTION W/PHACO Left 08/26/2014  ? Procedure: CATARACT EXTRACTION PHACO AND INTRAOCULAR LENS PLACEMENT (IOC);  Surgeon: [name], MD;  Location: [facility];  Service: Ophthalmology;  Laterality: Left;  CDE:  9.49  ? COLONOSCOPY N/A 12/13/2014   ? Procedure: COLONOSCOPY;  Surgeon: Rogene Houston, MD;  Location: [facility];  Service: Endoscopy;  Laterality: N/A;  100  ? ESOPHAGEAL BANDING N/A 08/31/2019  ? Procedure: ESOPHAGEAL BANDING;  Surgeon: Rogene Houston, MD;  Location: [facility];  Service: Endoscopy;  Laterality: N/A;  ? ESOPHAGOGASTRODUODENOSCOPY N/A 12/13/2014  ? Procedure: ESOPHAGOGASTRODUODENOSCOPY (EGD);  Surgeon: Rogene Houston, MD;  [facility];  Service: Endoscopy;  Laterality: N/A;  ? ESOPHAGOGASTRODUODENOSCOPY (EGD) WITH PROPOFOL N/A 08/31/2019  ? Procedure: ESOPHAGOGASTRODUODENOSCOPY (EGD) WITH PROPOFOL;  Surgeon: Rogene Houston, MD;  [facility];  Service: Endoscopy;  Laterality: N/A;  1200  ? ESOPHAGOGASTRODUODENOSCOPY (EGD) WITH PROPOFOL N/A 10/01/2020  ? Procedure: ESOPHAGOGASTRODUODENOSCOPY (EGD) WITH PROPOFOL;  Surgeon: Rogene Houston, MD;  Location: [facility];  Service: Endoscopy;  Laterality: N/A;  10:00  ? HERNIA REPAIR Right   ? Inguinal hernia  ? TONSILLECTOMY    ? ? ?Current Medications: ?Current Meds  ?Medication Sig  ? atorvastatin (LIPITOR) 20 MG tablet Take 20 mg by mouth at bedtime.   ? furosemide (LASIX) 20 MG tablet Take 20 mg by mouth daily.  ? lactulose (CHRONULAC) 10 GM/15ML solution Take 30 mLs (20 g total) by mouth 3 (three) times daily. Goal is 2 bowel movements daily.  ? levothyroxine (SYNTHROID) 25 MCG tablet Take 0.5 tablets (12.5 mcg total) by mouth daily before breakfast.  ?  Omega-3 Fatty Acids (OMEGA 3 500 PO) Take 500 mg by mouth at bedtime.  ? pantoprazole (PROTONIX) 40 MG tablet Take 1 tablet (40 mg total) by mouth daily before breakfast.  ? rifaximin (XIFAXAN) 550 MG TABS tablet Take 1 tablet (550 mg total) by mouth 2 (two) times daily. (Patient taking differently: Take 550 mg by mouth 2 (two) times daily. Continuously)  ? sucralfate (CARAFATE) 1 g tablet Take 1 tablet (1 g total) by mouth 2 (two) times daily for 15 days.  ? [DISCONTINUED] metoprolol tartrate  (LOPRESSOR) 25 MG tablet Take 0.5 tablets (12.5 mg total) by mouth 2 (two) times daily.  ?  ? ?Allergies:   Patient has no known allergies.  ? ?Social History  ? ?Socioeconomic History  ? Marital status: Married  ?  Spouse name: Not on file  ? Number of children: Not on file  ? Years of education: Not on file  ? Highest education level: Not on file  ?Occupational History  ? Not on file  ?Tobacco Use  ? Smoking status: Never  ?  Passive exposure: Never  ? Smokeless tobacco: Never  ?Vaping Use  ? Vaping Use: Never used  ?Substance and Sexual Activity  ? Alcohol use: Not Currently  ? Drug use: No  ? Sexual activity: Yes  ?Other Topics Concern  ? Not on file  ?Social History Narrative  ? Not on file  ? ?Social Determinants of Health  ? ?Financial Resource Strain: Not on file  ?Food Insecurity: Not on file  ?Transportation Needs: Not on file  ?Physical Activity: Not on file  ?Stress: Not on file  ?Social Connections: Not on file  ?  ? ?Family History: ?The patient's family history includes Aneurysm in his father; Cancer in his father. ? ?ROS:   ?Please see the history of present illness.    ?All other systems reviewed and are negative. ? ?EKGs/Labs/Other Studies Reviewed:   ? ? ?Recent Labs: ?06/25/2021: Magnesium 2.0 ?07/28/2021: ALT 37; BUN 27; Creat 1.56; Hemoglobin 9.1; Platelets 56; Potassium 4.2; Sodium 136; TSH 4.90  ?Recent Lipid Panel ?No results found for: CHOL, TRIG, HDL, CHOLHDL, VLDL, LDLCALC, LDLDIRECT ? ?    ? ?Physical Exam:   ? ?VS:  BP 118/62   Pulse 76   Ht 5' 7"  (1.702 m)   Wt 219 lb 12.8 oz (99.7 kg)   SpO2 98%   BMI 34.43 kg/m?    ? ?Wt Readings from Last 3 Encounters:  ?08/10/21 219 lb 12.8 oz (99.7 kg)  ?07/28/21 213 lb 14.4 oz (97 kg)  ?07/16/21 208 lb (94.3 kg)  ?  ? ?GEN:  Elderly Male ?HEENT: Normal ?NECK: No JVD ?LYMPHATICS: No lymphadenopathy ?CARDIAC: RRR no murmurs, rubs, gallops ?RESPIRATORY:  Clear to auscultation without rales, wheezing or rhonchi  ?ABDOMEN: Soft, non-tender,  non-distended ?MUSCULOSKELETAL:  No edema; No deformity  ?SKIN: Jaundice ?NEUROLOGIC:  Resting asterixis ?PSYCHIATRIC:  Normal affect  ? ?ASSESSMENT:   ? ?1. Coronary artery disease of native artery of native heart with stable angina pectoris ([place])   ?2. End stage liver disease ([place])   ?3. Nonrheumatic aortic valve stenosis   ? ?PLAN:   ? ?Coronary Artery Disease; Obstructive/Nonobstructive ?Severe AS ?End stand Cirrhosis ?- he is not a candidate for procedural or surgical intervention; caridac rehab is likely not appropriate; he has declined to change to hospice care; he is not a candidate for liver transplant ?- we will do our best to use medication to support his symptoms to let  him do his ADLs ?- continue metoprolol, if new CP will start Imdur 15 mg ?- we reviewed was symptomatic bradycardia might be and will stop BB if this is the case ?- we discussed his goal of being at his eldest granddaughters graduation, discussed that unless limited by activity, basic activities are reasonable ?- Reviewed case with daughter and wife ? ?PRN  ?We will refill his metoprolol based on his needs ? ?Time Spent Directly with Patient: ?  ?I have spent a total of 40 minute with the patient reviewing notes, imaging, EKGs, labs and examining the patient as well as establishing an assessment and plan that was discussed personally with the patient.  > 50% of time was spent in direct patient care and discussing with family. ? ? ? ?Medication Adjustments/Labs and Tests Ordered: ?Current medicines are reviewed at length with the patient today.  Concerns regarding medicines are outlined above.  ?No orders of the defined types were placed in this encounter. ? ?Meds ordered this encounter  ?Medications  ? metoprolol tartrate (LOPRESSOR) 25 MG tablet  ?  Sig: Take 0.5 tablets (12.5 mg total) by mouth 2 (two) times daily.  ?  Dispense:  90 tablet  ?  Refill:  3  ? ? ?Patient Instructions  ?Follow-Up: ?Follow up with Dr. Gasper Sells as  needed.  ? ?Any Other Special Instructions Will Be Listed Below (If Applicable). ? ? ? ? ?If you need a refill on your cardiac medications before your next appointment, please call your pharmacy. ?  ? ?Signed, ?[place]

## 2021-08-10 ENCOUNTER — Encounter: Payer: Self-pay | Admitting: Internal Medicine

## 2021-08-10 ENCOUNTER — Other Ambulatory Visit (INDEPENDENT_AMBULATORY_CARE_PROVIDER_SITE_OTHER): Payer: Self-pay | Admitting: Gastroenterology

## 2021-08-10 ENCOUNTER — Ambulatory Visit (INDEPENDENT_AMBULATORY_CARE_PROVIDER_SITE_OTHER): Payer: Medicare Other | Admitting: Internal Medicine

## 2021-08-10 ENCOUNTER — Telehealth (INDEPENDENT_AMBULATORY_CARE_PROVIDER_SITE_OTHER): Payer: Self-pay

## 2021-08-10 VITALS — BP 118/62 | HR 76 | Ht 67.0 in | Wt 219.8 lb

## 2021-08-10 DIAGNOSIS — I350 Nonrheumatic aortic (valve) stenosis: Secondary | ICD-10-CM | POA: Diagnosis not present

## 2021-08-10 DIAGNOSIS — Z7189 Other specified counseling: Secondary | ICD-10-CM | POA: Diagnosis not present

## 2021-08-10 DIAGNOSIS — I25118 Atherosclerotic heart disease of native coronary artery with other forms of angina pectoris: Secondary | ICD-10-CM | POA: Diagnosis not present

## 2021-08-10 DIAGNOSIS — R188 Other ascites: Secondary | ICD-10-CM

## 2021-08-10 DIAGNOSIS — K7210 Chronic hepatic failure without coma: Secondary | ICD-10-CM

## 2021-08-10 MED ORDER — METOPROLOL TARTRATE 25 MG PO TABS: 12.5000 mg | ORAL_TABLET | Freq: Two times a day (BID) | ORAL | 3 refills | Status: DC

## 2021-08-10 NOTE — Patient Instructions (Signed)
Follow-Up: ?Follow up with Dr. Gasper Sells as needed.  ? ?Any Other Special Instructions Will Be Listed Below (If Applicable). ? ? ? ? ?If you need a refill on your cardiac medications before your next appointment, please call your pharmacy. ? ?

## 2021-08-10 NOTE — Telephone Encounter (Signed)
Cody Austin patient aware we will be reaching out to set this up, if unable to reach at home number please also call him on his cell. Thanks ?

## 2021-08-10 NOTE — Telephone Encounter (Signed)
Thanks, I placed an order for repeat therapeutic paracentesis ?Hi Darius Bump,  can you please schedule a therapeutic paracentesis? Dx: cirrhosis. Can pull a max of 8 L of fluid, needs albumin if >5 L are removed. ? ?Thanks, ? ?Maylon Peppers, MD ?Gastroenterology and Hepatology ?[facility] ? ?

## 2021-08-10 NOTE — Telephone Encounter (Signed)
Patient called today stating he is in need of a paracentesis he is having some issues with swelling in abd. No issues with shortness of breath. Would like this done on Wednesday at [facility] as this is the only day he can get transportation.  ?

## 2021-08-11 ENCOUNTER — Encounter (HOSPITAL_COMMUNITY): Payer: Self-pay

## 2021-08-11 ENCOUNTER — Ambulatory Visit (HOSPITAL_COMMUNITY)
Admission: RE | Admit: 2021-08-11 | Discharge: 2021-08-11 | Disposition: A | Discharge status: Home / Self Care | Payer: Medicare Other | Source: Ambulatory Visit | Attending: Gastroenterology | Admitting: Gastroenterology

## 2021-08-11 DIAGNOSIS — I739 Peripheral vascular disease, unspecified: Secondary | ICD-10-CM | POA: Diagnosis not present

## 2021-08-11 DIAGNOSIS — I350 Nonrheumatic aortic (valve) stenosis: Secondary | ICD-10-CM | POA: Diagnosis not present

## 2021-08-11 DIAGNOSIS — R188 Other ascites: Secondary | ICD-10-CM | POA: Diagnosis not present

## 2021-08-11 DIAGNOSIS — K7581 Nonalcoholic steatohepatitis (NASH): Secondary | ICD-10-CM | POA: Diagnosis not present

## 2021-08-11 DIAGNOSIS — D649 Anemia, unspecified: Secondary | ICD-10-CM | POA: Diagnosis not present

## 2021-08-11 DIAGNOSIS — K7210 Chronic hepatic failure without coma: Secondary | ICD-10-CM | POA: Diagnosis not present

## 2021-08-11 DIAGNOSIS — I214 Non-ST elevation (NSTEMI) myocardial infarction: Secondary | ICD-10-CM | POA: Diagnosis not present

## 2021-08-11 NOTE — Progress Notes (Signed)
PT tolerated right sided paracentesis procedure well today and 1.4 Liters of clear light amber colored fluid removed. PT verbalized understanding of discharge instructions and ambulatory at discharge with no acute distress noted.  ?

## 2021-08-11 NOTE — Procedures (Signed)
PreOperative Dx: Ascites ?Postoperative Dx: Ascites ?Procedure:   US guided paracentesis ?Radiologist:  Thornton Papas ?Anesthesia:  10 ml of1% lidocaine ?Specimen:  1.4 L of yellow ascitic fluid ?EBL:   < 1 ml ?Complications: None   ?

## 2021-08-12 DIAGNOSIS — I214 Non-ST elevation (NSTEMI) myocardial infarction: Secondary | ICD-10-CM | POA: Diagnosis not present

## 2021-08-12 DIAGNOSIS — K7210 Chronic hepatic failure without coma: Secondary | ICD-10-CM | POA: Diagnosis not present

## 2021-08-12 DIAGNOSIS — I739 Peripheral vascular disease, unspecified: Secondary | ICD-10-CM | POA: Diagnosis not present

## 2021-08-12 DIAGNOSIS — I350 Nonrheumatic aortic (valve) stenosis: Secondary | ICD-10-CM | POA: Diagnosis not present

## 2021-08-12 DIAGNOSIS — K7581 Nonalcoholic steatohepatitis (NASH): Secondary | ICD-10-CM | POA: Diagnosis not present

## 2021-08-12 DIAGNOSIS — D649 Anemia, unspecified: Secondary | ICD-10-CM | POA: Diagnosis not present

## 2021-08-13 DIAGNOSIS — I214 Non-ST elevation (NSTEMI) myocardial infarction: Secondary | ICD-10-CM | POA: Diagnosis not present

## 2021-08-13 DIAGNOSIS — K7581 Nonalcoholic steatohepatitis (NASH): Secondary | ICD-10-CM | POA: Diagnosis not present

## 2021-08-13 DIAGNOSIS — I739 Peripheral vascular disease, unspecified: Secondary | ICD-10-CM | POA: Diagnosis not present

## 2021-08-13 DIAGNOSIS — D649 Anemia, unspecified: Secondary | ICD-10-CM | POA: Diagnosis not present

## 2021-08-13 DIAGNOSIS — K7210 Chronic hepatic failure without coma: Secondary | ICD-10-CM | POA: Diagnosis not present

## 2021-08-13 DIAGNOSIS — I350 Nonrheumatic aortic (valve) stenosis: Secondary | ICD-10-CM | POA: Diagnosis not present

## 2021-08-14 DIAGNOSIS — K7581 Nonalcoholic steatohepatitis (NASH): Secondary | ICD-10-CM | POA: Diagnosis not present

## 2021-08-14 DIAGNOSIS — I739 Peripheral vascular disease, unspecified: Secondary | ICD-10-CM | POA: Diagnosis not present

## 2021-08-14 DIAGNOSIS — D649 Anemia, unspecified: Secondary | ICD-10-CM | POA: Diagnosis not present

## 2021-08-14 DIAGNOSIS — I350 Nonrheumatic aortic (valve) stenosis: Secondary | ICD-10-CM | POA: Diagnosis not present

## 2021-08-14 DIAGNOSIS — I214 Non-ST elevation (NSTEMI) myocardial infarction: Secondary | ICD-10-CM | POA: Diagnosis not present

## 2021-08-14 DIAGNOSIS — K7210 Chronic hepatic failure without coma: Secondary | ICD-10-CM | POA: Diagnosis not present

## 2021-08-18 ENCOUNTER — Other Ambulatory Visit (INDEPENDENT_AMBULATORY_CARE_PROVIDER_SITE_OTHER): Payer: Self-pay | Admitting: *Deleted

## 2021-08-18 DIAGNOSIS — D649 Anemia, unspecified: Secondary | ICD-10-CM

## 2021-08-18 DIAGNOSIS — I214 Non-ST elevation (NSTEMI) myocardial infarction: Secondary | ICD-10-CM | POA: Diagnosis not present

## 2021-08-18 DIAGNOSIS — K7581 Nonalcoholic steatohepatitis (NASH): Secondary | ICD-10-CM | POA: Diagnosis not present

## 2021-08-18 DIAGNOSIS — I350 Nonrheumatic aortic (valve) stenosis: Secondary | ICD-10-CM | POA: Diagnosis not present

## 2021-08-18 DIAGNOSIS — K7460 Unspecified cirrhosis of liver: Secondary | ICD-10-CM

## 2021-08-18 DIAGNOSIS — E039 Hypothyroidism, unspecified: Secondary | ICD-10-CM

## 2021-08-18 DIAGNOSIS — I739 Peripheral vascular disease, unspecified: Secondary | ICD-10-CM | POA: Diagnosis not present

## 2021-08-18 DIAGNOSIS — K7210 Chronic hepatic failure without coma: Secondary | ICD-10-CM | POA: Diagnosis not present

## 2021-08-20 DIAGNOSIS — K7460 Unspecified cirrhosis of liver: Secondary | ICD-10-CM | POA: Diagnosis not present

## 2021-08-20 DIAGNOSIS — E039 Hypothyroidism, unspecified: Secondary | ICD-10-CM | POA: Diagnosis not present

## 2021-08-20 DIAGNOSIS — K7581 Nonalcoholic steatohepatitis (NASH): Secondary | ICD-10-CM | POA: Diagnosis not present

## 2021-08-20 DIAGNOSIS — D649 Anemia, unspecified: Secondary | ICD-10-CM | POA: Diagnosis not present

## 2021-08-21 LAB — COMPREHENSIVE METABOLIC PANEL
AG Ratio: 0.7 (calc) — ABNORMAL LOW (ref 1.0–2.5)
ALT: 120 U/L — ABNORMAL HIGH (ref 9–46)
AST: 420 U/L — ABNORMAL HIGH (ref 10–35)
Albumin: 1.9 g/dL — ABNORMAL LOW (ref 3.6–5.1)
Alkaline phosphatase (APISO): 117 U/L (ref 35–144)
BUN/Creatinine Ratio: 22 (calc) (ref 6–22)
BUN: 31 mg/dL — ABNORMAL HIGH (ref 7–25)
CO2: 29 mmol/L (ref 20–32)
Calcium: 7.8 mg/dL — ABNORMAL LOW (ref 8.6–10.3)
Chloride: 102 mmol/L (ref 98–110)
Creat: 1.44 mg/dL — ABNORMAL HIGH (ref 0.70–1.28)
Globulin: 2.7 g/dL (calc) (ref 1.9–3.7)
Glucose, Bld: 92 mg/dL (ref 65–99)
Potassium: 4.1 mmol/L (ref 3.5–5.3)
Sodium: 134 mmol/L — ABNORMAL LOW (ref 135–146)
Total Bilirubin: 4.5 mg/dL — ABNORMAL HIGH (ref 0.2–1.2)
Total Protein: 4.6 g/dL — ABNORMAL LOW (ref 6.1–8.1)

## 2021-08-21 LAB — CBC WITH DIFFERENTIAL/PLATELET
Absolute Monocytes: 968 cells/uL — ABNORMAL HIGH (ref 200–950)
Basophils Absolute: 28 cells/uL (ref 0–200)
Basophils Relative: 0.5 %
Eosinophils Absolute: 22 cells/uL (ref 15–500)
Eosinophils Relative: 0.4 %
HCT: 25.5 % — ABNORMAL LOW (ref 38.5–50.0)
Hemoglobin: 8.6 g/dL — ABNORMAL LOW (ref 13.2–17.1)
Lymphs Abs: 847 cells/uL — ABNORMAL LOW (ref 850–3900)
MCH: 35.4 pg — ABNORMAL HIGH (ref 27.0–33.0)
MCHC: 33.7 g/dL (ref 32.0–36.0)
MCV: 104.9 fL — ABNORMAL HIGH (ref 80.0–100.0)
MPV: 12 fL (ref 7.5–12.5)
Monocytes Relative: 17.6 %
Neutro Abs: 3636 cells/uL (ref 1500–7800)
Neutrophils Relative %: 66.1 %
Platelets: 56 10*3/uL — ABNORMAL LOW (ref 140–400)
RBC: 2.43 10*6/uL — ABNORMAL LOW (ref 4.20–5.80)
RDW: 17.2 % — ABNORMAL HIGH (ref 11.0–15.0)
Total Lymphocyte: 15.4 %
WBC: 5.5 10*3/uL (ref 3.8–10.8)

## 2021-08-21 LAB — TSH: TSH: 6.64 mIU/L — ABNORMAL HIGH (ref 0.40–4.50)

## 2021-08-21 LAB — PROTIME-INR
INR: 1.7 — ABNORMAL HIGH
Prothrombin Time: 17.2 s — ABNORMAL HIGH (ref 9.0–11.5)

## 2021-08-21 LAB — AFP TUMOR MARKER: AFP-Tumor Marker: 10.3 ng/mL — ABNORMAL HIGH (ref <6.1)

## 2021-08-23 LAB — ACID FAST CULTURE WITH REFLEXED SENSITIVITIES (MYCOBACTERIA): Acid Fast Culture: NEGATIVE

## 2021-08-24 ENCOUNTER — Telehealth (INDEPENDENT_AMBULATORY_CARE_PROVIDER_SITE_OTHER): Payer: Self-pay | Admitting: *Deleted

## 2021-08-24 ENCOUNTER — Other Ambulatory Visit (INDEPENDENT_AMBULATORY_CARE_PROVIDER_SITE_OTHER): Payer: Self-pay

## 2021-08-24 DIAGNOSIS — R188 Other ascites: Secondary | ICD-10-CM

## 2021-08-24 NOTE — Telephone Encounter (Signed)
Ok to schedule for tomorrow per Dr. Laural Golden. Pt does have appt here tomorrow at 8 am and he wanted to see if he could do paracentesis before or after his appt  ?

## 2021-08-24 NOTE — Telephone Encounter (Signed)
Patient left voicemail that he is wanting to get paracentesis schedule for tomorrow if possible before or after his appt tomorrow with dr Laural Golden. His appt is 8am with dr Laural Golden tomorrow. He is leaving to go out of town Thursday.  ?

## 2021-08-25 ENCOUNTER — Ambulatory Visit (INDEPENDENT_AMBULATORY_CARE_PROVIDER_SITE_OTHER): Payer: Medicare Other | Admitting: Internal Medicine

## 2021-08-25 ENCOUNTER — Ambulatory Visit (HOSPITAL_COMMUNITY)
Admission: RE | Admit: 2021-08-25 | Discharge: 2021-08-25 | Disposition: A | Discharge status: Home / Self Care | Payer: Medicare Other | Source: Ambulatory Visit | Attending: Internal Medicine | Admitting: Internal Medicine

## 2021-08-25 ENCOUNTER — Encounter (INDEPENDENT_AMBULATORY_CARE_PROVIDER_SITE_OTHER): Payer: Self-pay | Admitting: Internal Medicine

## 2021-08-25 ENCOUNTER — Encounter (HOSPITAL_COMMUNITY): Payer: Self-pay

## 2021-08-25 VITALS — BP 106/69 | HR 60 | Temp 98.0°F | Ht 67.0 in | Wt 213.4 lb

## 2021-08-25 DIAGNOSIS — I25118 Atherosclerotic heart disease of native coronary artery with other forms of angina pectoris: Secondary | ICD-10-CM | POA: Diagnosis not present

## 2021-08-25 DIAGNOSIS — R188 Other ascites: Secondary | ICD-10-CM | POA: Insufficient documentation

## 2021-08-25 DIAGNOSIS — K7581 Nonalcoholic steatohepatitis (NASH): Secondary | ICD-10-CM | POA: Diagnosis not present

## 2021-08-25 DIAGNOSIS — K7460 Unspecified cirrhosis of liver: Secondary | ICD-10-CM | POA: Diagnosis not present

## 2021-08-25 DIAGNOSIS — E039 Hypothyroidism, unspecified: Secondary | ICD-10-CM

## 2021-08-25 DIAGNOSIS — K769 Liver disease, unspecified: Secondary | ICD-10-CM | POA: Diagnosis not present

## 2021-08-25 DIAGNOSIS — D649 Anemia, unspecified: Secondary | ICD-10-CM | POA: Diagnosis not present

## 2021-08-25 DIAGNOSIS — K7682 Hepatic encephalopathy: Secondary | ICD-10-CM | POA: Diagnosis not present

## 2021-08-25 LAB — BODY FLUID CELL COUNT WITH DIFFERENTIAL
Eos, Fluid: 0 %
Lymphs, Fluid: 54 %
Monocyte-Macrophage-Serous Fluid: 20 % — ABNORMAL LOW (ref 50–90)
Neutrophil Count, Fluid: 26 % — ABNORMAL HIGH (ref 0–25)
Total Nucleated Cell Count, Fluid: 257 cu mm (ref 0–1000)

## 2021-08-25 LAB — GRAM STAIN

## 2021-08-25 LAB — PROTEIN, PLEURAL OR PERITONEAL FLUID: Total protein, fluid: <3 g/dL

## 2021-08-25 LAB — GLUCOSE, PLEURAL OR PERITONEAL FLUID: Glucose, Fluid: 84 mg/dL

## 2021-08-25 MED ORDER — LEVOTHYROXINE SODIUM 25 MCG PO TABS: 25.0000 ug | ORAL_TABLET | Freq: Every day | ORAL | 1 refills | Status: DC

## 2021-08-25 NOTE — Patient Instructions (Signed)
Next blood work in 4 weeks prior to office visit. ?Increase physical activity as tolerated. ?Notify you experience any side effects with higher dose of levothyroxine. ?

## 2021-08-25 NOTE — Progress Notes (Signed)
Presenting complaint; ? ?Follow-up for decompensated chronic liver disease and anemia. ? ?Database and subjective: ? ?Patient is here for scheduled visit accompanied by his wife and their daughter [name].  He was last seen on 07/28/2021. ?He has history of cirrhosis secondary to NASH diagnosed in 2016 when he presented with thrombocytopenia.  Initial work-up was by Dr. Tressie Stalker of hematology oncology.  He remained with compensated hepatic function until he developed COVID over 2 years ago.  He developed hepatic encephalopathy.  This year he developed he also developed ascites.  Ultrasound for [facility] screening in December 2022 did not reveal any liver lesions.  MRI was obtained because of rising bilirubin in February 2023 revealing a 12 mm enhancing lesion in segment 7 and 8 LI-RADS 4 and a second lesion measuring 15 mm with delayed enhancement characterized as LI-RADS 3.  He was also found to have 16 mm cystic lesion in head of pancreas felt to be sidebranch IPMN.  With this new finding he was referred back to [facility] for treatment of hepatic lesions and reevaluation for liver transplant. ?Patient developed MI while he was undergoing abdominal breast and disease.  Cardiac cath revealed triple-vessel disease and patient was deemed not to be a candidate for transplant and he was discharged.  He was briefly hospitalized at [facility] for chest pain and shortness of breath.  His therapy was optimized.  He was noted to have a hemoglobin of 7.2 g.  He received total of 4 units of PRBCs.  He did not undergo any endoscopic evaluation. ?He underwent abdominal tap on 08/11/2021 and again today with removal of 1.7 L of fluid. ? ?He has no specific complaints.  He says he has 3 bowel movements daily.  2 are large and the third bowel movement is small.  He has not had any confusion episodes.  No melena or rectal bleeding.  His daughter [name] is concerned because he does not do any physical activity.  He just feels  tired.  He is getting meals delivered from [facility] MD. it is a low-salt diet.  He gets 14 meals every week.  He has small breakfast.  He drinks protein shake at lunch and then has regular meal at supper. ? ? ?Current Medications: ?Outpatient Encounter Medications as of 08/25/2021  ?Medication Sig  ? atorvastatin (LIPITOR) 20 MG tablet Take 20 mg by mouth at bedtime.   ? furosemide (LASIX) 20 MG tablet Take 20 mg by mouth daily.  ? lactulose (CHRONULAC) 10 GM/15ML solution Take 30 mLs (20 g total) by mouth 3 (three) times daily. Goal is 2 bowel movements daily.  ? levothyroxine (SYNTHROID) 25 MCG tablet Take 0.5 tablets (12.5 mcg total) by mouth daily before breakfast.  ? metoprolol tartrate (LOPRESSOR) 25 MG tablet Take 0.5 tablets (12.5 mg total) by mouth 2 (two) times daily.  ? Omega-3 Fatty Acids (OMEGA 3 500 PO) Take 500 mg by mouth at bedtime.  ? pantoprazole (PROTONIX) 40 MG tablet Take 1 tablet (40 mg total) by mouth daily before breakfast.  ? rifaximin (XIFAXAN) 550 MG TABS tablet Take 1 tablet (550 mg total) by mouth 2 (two) times daily. (Patient taking differently: Take 550 mg by mouth 2 (two) times daily. Continuously)  ? [DISCONTINUED] sucralfate (CARAFATE) 1 g tablet Take 1 tablet (1 g total) by mouth 2 (two) times daily for 15 days.  ? ?No facility-administered encounter medications on file as of 08/25/2021.  ? ? ? ?Objective: ?Blood pressure 106/69, pulse 60, temperature 98 ?  F (36.7 ?C), temperature source Oral, height 5' 7"  (1.702 m), weight 213 lb 6.4 oz (96.8 kg). ?Patient is alert and in no acute distress. ?He appears pale.  He has mild tremors but no asterixis. ?Conjunctiva is pink. Sclera is nonicteric ?Oropharyngeal mucosa is normal. ?No neck masses or thyromegaly noted. ?Cardiac exam with regular rhythm normal S1 and S2.  Loud systolic murmur best heard at aortic area. ?Lungs are clear to auscultation. ?Abdomen is full but not tense or tender. ?He has 1-2+ pitting edema involving both  legs. ? ?Labs/studies Results: ? ? ? ?  Latest Ref Rng & Units 08/20/2021  ? 10:18 AM 07/28/2021  ?  3:46 PM 07/20/2021  ?  1:59 AM  ?CBC  ?WBC 3.8 - 10.8 Thousand/uL 5.5   4.5   3.5    ?Hemoglobin 13.2 - 17.1 g/dL 8.6   9.1   8.7    ?Hematocrit 38.5 - 50.0 % 25.5   27.0   26.2    ?Platelets 140 - 400 Thousand/uL 56   56   40    ?  ? ?  Latest Ref Rng & Units 08/20/2021  ? 10:18 AM 07/28/2021  ?  3:46 PM 07/20/2021  ?  1:59 AM  ?CMP  ?Glucose 65 - 99 mg/dL 92   93   108    ?BUN 7 - 25 mg/dL 31   27   28     ?Creatinine 0.70 - 1.28 mg/dL 1.44   1.56   1.43    ?Sodium 135 - 146 mmol/L 134   136   134    ?Potassium 3.5 - 5.3 mmol/L 4.1   4.2   3.7    ?Chloride 98 - 110 mmol/L 102   102   104    ?CO2 20 - 32 mmol/L 29   28   26     ?Calcium 8.6 - 10.3 mg/dL 7.8   8.2   7.7    ?Total Protein 6.1 - 8.1 g/dL 4.6   4.4   4.0    ?Total Bilirubin 0.2 - 1.2 mg/dL 4.5   5.7   5.0    ?Alkaline Phos 38 - 126 U/L   60    ?AST 10 - 35 U/L 420   64   86    ?ALT 9 - 46 U/L 120   37   45    ?  ? ?  Latest Ref Rng & Units 08/20/2021  ? 10:18 AM 07/28/2021  ?  3:46 PM 07/20/2021  ?  1:59 AM  ?Hepatic Function  ?Total Protein 6.1 - 8.1 g/dL 4.6   4.4   4.0    ?Albumin 3.5 - 5.0 g/dL   1.9    ?AST 10 - 35 U/L 420   64   86    ?ALT 9 - 46 U/L 120   37   45    ?Alk Phosphatase 38 - 126 U/L   60    ?Total Bilirubin 0.2 - 1.2 mg/dL 4.5   5.7   5.0    ?  ?INR 1.7 ? ?Alpha-fetoprotein 10.3. ? ?TSH 6.64.  It was 4.904 weeks ago. ? ?Assessment: ? ?#1.  Decompensated cirrhosis secondary to NASH with multiple complications.  Hepatic encephalopathy is well controlled with Xifaxan and lactulose.  For his ascites he is requiring periodic abdominal paracenteses for symptomatic relief.  He is not a candidate for higher dose of furosemide as it would result in worsening of renal function. ?There has  been slight improvement in hepatic function as evidenced by improvement in INR.  His MELD score has decreased from 24 on his last visit to 22 today. ?Child-Pugh score is C/13  based on blood work from last week. ? ?#2.  2 hepatic lesions 1 of which is LI-RADS 4 and concerning for [place].  I discussed his condition with Dr. Huel Coventry of IR radiology and also with Dr. Delton Coombes of oncology.  Unfortunately he is not a candidate for chemotherapy or TACE or RFA given that he is Child-Pugh C.  If hepatic function improves these options would be reconsidered. ? ?#3.  Anemia.  His hemoglobin is gradually drifting downwards.  We will continue to monitor.  No evidence of gross GI bleed. ? ?#4.  Hypothyroidism.  He is on 12.5 mcg of levothyroxine daily.  TSH is high.  He presently does not have a primary care physician.  Therefore I will increase levothyroxine dose. ? ? ?Plan: ? ?Patient will continue low-salt diet. ?He will continue PPI lactulose and Xifaxan as before. ?Abdominal tap on as-needed basis. ?Increase levothyroxine to 25 mcg by mouth every morning.  Patient is aware to take it first thing in the morning on empty stomach and wait at least 30 minutes before he eats. ?Patient encouraged to increase physical activity as tolerated. ?Patient will have CBC, comprehensive chemistry panel, INR and TSH in 4 weeks prior to his next visit. ? ? ? ? ? ?

## 2021-08-25 NOTE — Procedures (Signed)
PreOperative Dx: Cirrhosis, ascites ?Postoperative Dx: Cirrhosis, ascites ?Procedure:   US guided paracentesis ?Radiologist:  Thornton Papas ?Anesthesia:  10 ml of1% lidocaine ?Specimen:  1.7 L of dark amber ascitic fluid ?EBL:   < 1 ml ?Complications: None   ?

## 2021-08-25 NOTE — Progress Notes (Signed)
PT tolerated right sided paracentesis procedure well today and 1.7 L of clear amber colored fluid removed with labs collected and sent for processing. PT verbalized understanding of discharge instructions and ambulatory at departure with no acute distress noted.  ?

## 2021-08-26 LAB — PATHOLOGIST SMEAR REVIEW

## 2021-08-27 DIAGNOSIS — Z515 Encounter for palliative care: Secondary | ICD-10-CM | POA: Diagnosis not present

## 2021-08-27 DIAGNOSIS — K7469 Other cirrhosis of liver: Secondary | ICD-10-CM | POA: Diagnosis not present

## 2021-08-27 LAB — ACID FAST SMEAR (AFB, MYCOBACTERIA): Acid Fast Smear: NEGATIVE

## 2021-08-30 LAB — CULTURE, BODY FLUID W GRAM STAIN -BOTTLE
Culture: NO GROWTH
Special Requests: ADEQUATE

## 2021-09-01 ENCOUNTER — Telehealth (INDEPENDENT_AMBULATORY_CARE_PROVIDER_SITE_OTHER): Payer: Self-pay | Admitting: *Deleted

## 2021-09-01 ENCOUNTER — Other Ambulatory Visit (INDEPENDENT_AMBULATORY_CARE_PROVIDER_SITE_OTHER): Payer: Self-pay | Admitting: *Deleted

## 2021-09-01 MED ORDER — FUROSEMIDE 20 MG PO TABS
20.0000 mg | ORAL_TABLET | Freq: Every day | ORAL | 2 refills | Status: DC
Start: 2021-09-01 — End: 2021-09-22

## 2021-09-01 NOTE — Telephone Encounter (Signed)
Pt needs refill on lasix. Taking 59m one half daily. Last sent in by Dr. ZMagnus Ivanper wife. She is unsrue who that dr is, thinks it was from hospital stay. She would like to get 216mso she does not have to cut in half.  ?[employer] ?[phone] ?

## 2021-09-01 NOTE — Telephone Encounter (Signed)
Ok to send in lasix 62m one daily #30 with 2 refills per dr rLaural Golden Called patient's wife and discussed and med sent to pharmacy. Pt's wife then tells me that the pharmacy gave them levothyroxine 50 mcg from dr gHilma Favorsinstead of 25 mcg that dr rLaural Goldenprescribed on 5/9. I called [facility] and spoke with pharmacist who told me the levothyroxine 50 mcg could be cut in half and he did not have any more 50 mcg on file. He saw the 25 mcg that dr rLaural Goldensent in and its on file for next refill. I called and discussed with pt's wife and she verbalized understanding.  ?

## 2021-09-02 ENCOUNTER — Other Ambulatory Visit (INDEPENDENT_AMBULATORY_CARE_PROVIDER_SITE_OTHER): Payer: Self-pay

## 2021-09-02 ENCOUNTER — Ambulatory Visit: Payer: Medicare Other | Admitting: Nutrition

## 2021-09-02 ENCOUNTER — Telehealth (INDEPENDENT_AMBULATORY_CARE_PROVIDER_SITE_OTHER): Payer: Self-pay

## 2021-09-02 DIAGNOSIS — R188 Other ascites: Secondary | ICD-10-CM

## 2021-09-02 NOTE — Telephone Encounter (Signed)
Patient called today to see if we could arrange for him to have a paracentesis done either Monday or Wednesday next week. I have texted Dr. Laural Golden for a response. ?

## 2021-09-02 NOTE — Telephone Encounter (Signed)
Cody Austin would you mind setting this up for the patient per Dr. Laural Golden ok to do either day. Thanks. ?

## 2021-09-09 ENCOUNTER — Other Ambulatory Visit (INDEPENDENT_AMBULATORY_CARE_PROVIDER_SITE_OTHER): Payer: Self-pay | Admitting: *Deleted

## 2021-09-09 ENCOUNTER — Encounter (HOSPITAL_COMMUNITY): Payer: Self-pay

## 2021-09-09 ENCOUNTER — Ambulatory Visit (HOSPITAL_COMMUNITY)
Admission: RE | Admit: 2021-09-09 | Discharge: 2021-09-09 | Disposition: A | Discharge status: Home / Self Care | Payer: Medicare Other | Source: Ambulatory Visit | Attending: Internal Medicine | Admitting: Internal Medicine

## 2021-09-09 DIAGNOSIS — K7460 Unspecified cirrhosis of liver: Secondary | ICD-10-CM

## 2021-09-09 DIAGNOSIS — K7682 Hepatic encephalopathy: Secondary | ICD-10-CM

## 2021-09-09 DIAGNOSIS — R188 Other ascites: Secondary | ICD-10-CM | POA: Diagnosis not present

## 2021-09-09 DIAGNOSIS — D638 Anemia in other chronic diseases classified elsewhere: Secondary | ICD-10-CM

## 2021-09-09 DIAGNOSIS — E039 Hypothyroidism, unspecified: Secondary | ICD-10-CM

## 2021-09-09 LAB — PROTEIN, PLEURAL OR PERITONEAL FLUID: Total protein, fluid: <3 g/dL

## 2021-09-09 LAB — BODY FLUID CELL COUNT WITH DIFFERENTIAL
Lymphs, Fluid: 63 %
Monocyte-Macrophage-Serous Fluid: 29 % — ABNORMAL LOW (ref 50–90)
Neutrophil Count, Fluid: 8 % (ref 0–25)
Total Nucleated Cell Count, Fluid: 184 cu mm (ref 0–1000)

## 2021-09-09 LAB — GRAM STAIN

## 2021-09-09 LAB — GLUCOSE, PLEURAL OR PERITONEAL FLUID: Glucose, Fluid: 84 mg/dL

## 2021-09-09 NOTE — Progress Notes (Signed)
PT tolerated right sided paracentesis procedure well today and 1.6 Liters of clear amber colored fluid removed with labs collected and sent for processing. PT verbalized understanding of discharge instructions and ambulatory at departure with no acute distress noted.

## 2021-09-09 NOTE — Procedures (Signed)
PROCEDURE SUMMARY:  Successful ultrasound guided paracentesis from the right lower quadrant.  Yielded 1.6 L of dark yellow/amber-colored fluid.  No immediate complications.  The patient tolerated the procedure well.   Specimen sent for labs.  EBL < 2 mL  The patient has required >/=2 paracenteses in a 30 day period and a screening evaluation by the [facility] has been arranged.  Soyla Dryer, [place] [phone] 09/09/2021, 11:02 AM

## 2021-09-10 ENCOUNTER — Ambulatory Visit (INDEPENDENT_AMBULATORY_CARE_PROVIDER_SITE_OTHER): Payer: Medicare Other | Admitting: Internal Medicine

## 2021-09-14 LAB — CULTURE, BODY FLUID W GRAM STAIN -BOTTLE
Culture: NO GROWTH
Special Requests: ADEQUATE

## 2021-09-15 ENCOUNTER — Telehealth (INDEPENDENT_AMBULATORY_CARE_PROVIDER_SITE_OTHER): Payer: Self-pay | Admitting: *Deleted

## 2021-09-15 DIAGNOSIS — Z515 Encounter for palliative care: Secondary | ICD-10-CM | POA: Diagnosis not present

## 2021-09-15 DIAGNOSIS — K7469 Other cirrhosis of liver: Secondary | ICD-10-CM | POA: Diagnosis not present

## 2021-09-15 NOTE — Telephone Encounter (Signed)
Cody Austin please see note below.  Ok per Dr. Laural Golden to set up US paracentesis for next Monday. Pt was notified he would get a call back after being scheduled.

## 2021-09-15 NOTE — Telephone Encounter (Signed)
Pt asking if he can have US paracentesis set up for next Monday ( June 5th ). States it seems like he needs one about every 2 weeks.   US paracentesis   4/2 4/25 5/9 5/24

## 2021-09-16 ENCOUNTER — Other Ambulatory Visit (INDEPENDENT_AMBULATORY_CARE_PROVIDER_SITE_OTHER): Payer: Self-pay

## 2021-09-16 DIAGNOSIS — R188 Other ascites: Secondary | ICD-10-CM

## 2021-09-16 NOTE — Progress Notes (Signed)
   Portal Hypertension Clinic Screening Evaluation   Indication for evaluation: Cody Austin is a 71 y.o. male undergoing preliminary evaluation in the [facility] due to recurrent ascites.  Referring Physician/Established Gastroenterologist:   Dr. Hildred Laser  Etiology of cirrhosis: NASH Initially diagnosed: 2021 # of paracentesis in last month: 3 # of paracentesis in last 2 months: 5 History of hepatic hydrothorax:  no History of hepatic encephalopathy: yes (managed medically)  Prior evaluation for liver transplant: Yes History of hepatocellular carcinoma: Yes (LR-4)  Prior esophagogastroduodenoscopy/intervention: 10/01/20 (Dr. Laural Golden) - no intervention Current esophageal varices: Grade I Current gastric varices: No (portal gastropathy) History of hematemesis: No  Current diuretic regimen: furosemide 20 mg QD Current pharmacologic encephalopathy prophylaxis/treatment: rifaximin 550 mg QD, lactulose 20 g TID  History of renal dysfunction: Yes History of hemodialysis: No  History of cardiac dysfunction: No  Other pertinent past medical history: CAD/NSTEMI, peripheral artery disease, hypothyroidism, sleep apnea   Imaging: Prior cross sectional imaging of portal system: CT AP 10/29/20  Patent portal system, large splenorenal and gastrorenal shunts, wall-adherent, non-occlusive chronic appearing thrombus in main portal near confluence of SMV/splenic, non-dilated/slightly diminutive main portal vein  MR 06/15/21  Non-occlusive thrombus in main portal vein, increased diminutive appearance of main portal and intrahepatic portal veins.   Massive splenorenal shunt/varices   Gastroesophageal varices, enlarged splenic vein and SMV, diminutive main portal  Echocardiogram: 04/09/21    Labs: 08/20/21 Creatinine: 1.44 Total Bilirubin: 4.5 INR: 1.7 Sodium: 134 Albumin: 1.9 A-FP: 10.3  Child-Pugh = 12 points, class  C MELD = 22 (19.6% estimated 3 month mortality) [place] Index of Post-TIPS Survival (FIPS) = 1.94 (Overall survival estimated at 1 month 72.6%, 3 months 36.7%, and 6 months 23.2%)    Assessment: Cody Austin is a 71 y.o. male with history of NASH cirrhosis (Child Pugh C, MELD 22) and recurrent ascites.  Of note, he was recently diagnosed with unifocal hepatocellular carcinoma in segment VIII (1.2 cm, LR4), currently without treatment options due to liver dysfunction.  On review of cross sectional imaging dating back to 2016, his main portal vein size has decreased and there has been concomitant dilation of the splenic vein and SMV with gradual rise of total bilirubin.  On closer examination, there is a non-occlusive thrombus near the portal confluence in the main portal vein that has been present since 2018.  I suspect this has contributed to worsening hepatic portal perfusion and thus gradually increasing hyperbilirubinemia.  In addition, his prior encephalopathy is almost certainly due to his large splenorenal portosystemic shunt that has developed.  His recent worsening ascites and hepatorenal syndrome have followed suit.  After preliminary evaluation, this patient, despite elevated MELD, would likely benefit from TIPS creation, portal vein angioplasty/stenting, and coil embolization of large splenorenal shunt to improve hepatopetal flow and thus hepatic perfusion.  This may also improve his chances of eventual liver directed therapy for [place] in addition to improved hepatorenal syndrome.  Recommendation: Formal consult for TIPS + variceal embolization and portal vein angioplasty/stenting could be considered.  The patient's Gastroenterologist, Dr. Laural Golden, will be contacted for further discussion.    Electronically Signed: Suzette Battiest, MD 09/16/2021, 9:50 AM

## 2021-09-17 DIAGNOSIS — E039 Hypothyroidism, unspecified: Secondary | ICD-10-CM | POA: Diagnosis not present

## 2021-09-17 DIAGNOSIS — K7460 Unspecified cirrhosis of liver: Secondary | ICD-10-CM | POA: Diagnosis not present

## 2021-09-17 DIAGNOSIS — K7581 Nonalcoholic steatohepatitis (NASH): Secondary | ICD-10-CM | POA: Diagnosis not present

## 2021-09-17 DIAGNOSIS — K7682 Hepatic encephalopathy: Secondary | ICD-10-CM | POA: Diagnosis not present

## 2021-09-17 DIAGNOSIS — D638 Anemia in other chronic diseases classified elsewhere: Secondary | ICD-10-CM | POA: Diagnosis not present

## 2021-09-18 LAB — CBC WITH DIFFERENTIAL/PLATELET
Absolute Monocytes: 725 cells/uL (ref 200–950)
Basophils Absolute: 31 cells/uL (ref 0–200)
Basophils Relative: 0.8 %
Eosinophils Absolute: 20 cells/uL (ref 15–500)
Eosinophils Relative: 0.5 %
HCT: 22.4 % — ABNORMAL LOW (ref 38.5–50.0)
Hemoglobin: 7.7 g/dL — ABNORMAL LOW (ref 13.2–17.1)
Lymphs Abs: 788 cells/uL — ABNORMAL LOW (ref 850–3900)
MCH: 36 pg — ABNORMAL HIGH (ref 27.0–33.0)
MCHC: 34.4 g/dL (ref 32.0–36.0)
MCV: 104.7 fL — ABNORMAL HIGH (ref 80.0–100.0)
MPV: 12.2 fL (ref 7.5–12.5)
Monocytes Relative: 18.6 %
Neutro Abs: 2336 cells/uL (ref 1500–7800)
Neutrophils Relative %: 59.9 %
Platelets: 53 10*3/uL — ABNORMAL LOW (ref 140–400)
RBC: 2.14 10*6/uL — ABNORMAL LOW (ref 4.20–5.80)
RDW: 15.2 % — ABNORMAL HIGH (ref 11.0–15.0)
Total Lymphocyte: 20.2 %
WBC: 3.9 10*3/uL (ref 3.8–10.8)

## 2021-09-18 LAB — COMPREHENSIVE METABOLIC PANEL
AG Ratio: 0.6 (calc) — ABNORMAL LOW (ref 1.0–2.5)
ALT: 64 U/L — ABNORMAL HIGH (ref 9–46)
AST: 124 U/L — ABNORMAL HIGH (ref 10–35)
Albumin: 1.7 g/dL — ABNORMAL LOW (ref 3.6–5.1)
Alkaline phosphatase (APISO): 110 U/L (ref 35–144)
BUN/Creatinine Ratio: 23 (calc) — ABNORMAL HIGH (ref 6–22)
BUN: 35 mg/dL — ABNORMAL HIGH (ref 7–25)
CO2: 24 mmol/L (ref 20–32)
Calcium: 7.9 mg/dL — ABNORMAL LOW (ref 8.6–10.3)
Chloride: 104 mmol/L (ref 98–110)
Creat: 1.54 mg/dL — ABNORMAL HIGH (ref 0.70–1.28)
Globulin: 2.9 g/dL (calc) (ref 1.9–3.7)
Glucose, Bld: 73 mg/dL (ref 65–99)
Potassium: 4.2 mmol/L (ref 3.5–5.3)
Sodium: 133 mmol/L — ABNORMAL LOW (ref 135–146)
Total Bilirubin: 3.9 mg/dL — ABNORMAL HIGH (ref 0.2–1.2)
Total Protein: 4.6 g/dL — ABNORMAL LOW (ref 6.1–8.1)

## 2021-09-18 LAB — TSH: TSH: 5.6 mIU/L — ABNORMAL HIGH (ref 0.40–4.50)

## 2021-09-18 LAB — PROTIME-INR
INR: 1.8 — ABNORMAL HIGH
Prothrombin Time: 18.1 s — ABNORMAL HIGH (ref 9.0–11.5)

## 2021-09-21 ENCOUNTER — Ambulatory Visit (HOSPITAL_COMMUNITY)
Admission: RE | Admit: 2021-09-21 | Discharge: 2021-09-21 | Disposition: A | Discharge status: Home / Self Care | Payer: Medicare Other | Source: Ambulatory Visit | Attending: Internal Medicine | Admitting: Internal Medicine

## 2021-09-21 ENCOUNTER — Encounter (HOSPITAL_COMMUNITY): Payer: Self-pay

## 2021-09-21 DIAGNOSIS — K3189 Other diseases of stomach and duodenum: Secondary | ICD-10-CM | POA: Diagnosis not present

## 2021-09-21 DIAGNOSIS — I739 Peripheral vascular disease, unspecified: Secondary | ICD-10-CM | POA: Diagnosis present

## 2021-09-21 DIAGNOSIS — E785 Hyperlipidemia, unspecified: Secondary | ICD-10-CM | POA: Diagnosis not present

## 2021-09-21 DIAGNOSIS — K7581 Nonalcoholic steatohepatitis (NASH): Secondary | ICD-10-CM | POA: Diagnosis not present

## 2021-09-21 DIAGNOSIS — M1990 Unspecified osteoarthritis, unspecified site: Secondary | ICD-10-CM | POA: Diagnosis present

## 2021-09-21 DIAGNOSIS — K7682 Hepatic encephalopathy: Secondary | ICD-10-CM | POA: Diagnosis not present

## 2021-09-21 DIAGNOSIS — I25118 Atherosclerotic heart disease of native coronary artery with other forms of angina pectoris: Secondary | ICD-10-CM | POA: Diagnosis not present

## 2021-09-21 DIAGNOSIS — I2510 Atherosclerotic heart disease of native coronary artery without angina pectoris: Secondary | ICD-10-CM | POA: Diagnosis present

## 2021-09-21 DIAGNOSIS — K766 Portal hypertension: Secondary | ICD-10-CM | POA: Diagnosis not present

## 2021-09-21 DIAGNOSIS — R188 Other ascites: Secondary | ICD-10-CM

## 2021-09-21 DIAGNOSIS — K7460 Unspecified cirrhosis of liver: Secondary | ICD-10-CM | POA: Diagnosis not present

## 2021-09-21 DIAGNOSIS — D500 Iron deficiency anemia secondary to blood loss (chronic): Secondary | ICD-10-CM | POA: Diagnosis not present

## 2021-09-21 DIAGNOSIS — Z66 Do not resuscitate: Secondary | ICD-10-CM | POA: Diagnosis present

## 2021-09-21 DIAGNOSIS — I8511 Secondary esophageal varices with bleeding: Secondary | ICD-10-CM | POA: Diagnosis not present

## 2021-09-21 DIAGNOSIS — I8500 Esophageal varices without bleeding: Secondary | ICD-10-CM | POA: Diagnosis not present

## 2021-09-21 DIAGNOSIS — E039 Hypothyroidism, unspecified: Secondary | ICD-10-CM | POA: Diagnosis not present

## 2021-09-21 DIAGNOSIS — Z79899 Other long term (current) drug therapy: Secondary | ICD-10-CM | POA: Diagnosis not present

## 2021-09-21 DIAGNOSIS — K7469 Other cirrhosis of liver: Secondary | ICD-10-CM | POA: Diagnosis not present

## 2021-09-21 DIAGNOSIS — G4730 Sleep apnea, unspecified: Secondary | ICD-10-CM | POA: Diagnosis present

## 2021-09-21 DIAGNOSIS — Z87442 Personal history of urinary calculi: Secondary | ICD-10-CM | POA: Diagnosis not present

## 2021-09-21 DIAGNOSIS — D649 Anemia, unspecified: Secondary | ICD-10-CM | POA: Diagnosis not present

## 2021-09-21 DIAGNOSIS — K922 Gastrointestinal hemorrhage, unspecified: Secondary | ICD-10-CM | POA: Diagnosis not present

## 2021-09-21 DIAGNOSIS — Z79890 Hormone replacement therapy (postmenopausal): Secondary | ICD-10-CM | POA: Diagnosis not present

## 2021-09-21 DIAGNOSIS — D61818 Other pancytopenia: Secondary | ICD-10-CM | POA: Diagnosis present

## 2021-09-21 DIAGNOSIS — N189 Chronic kidney disease, unspecified: Secondary | ICD-10-CM | POA: Diagnosis present

## 2021-09-21 DIAGNOSIS — I252 Old myocardial infarction: Secondary | ICD-10-CM | POA: Diagnosis not present

## 2021-09-21 DIAGNOSIS — R9431 Abnormal electrocardiogram [ECG] [EKG]: Secondary | ICD-10-CM | POA: Diagnosis not present

## 2021-09-21 DIAGNOSIS — I8510 Secondary esophageal varices without bleeding: Secondary | ICD-10-CM | POA: Diagnosis not present

## 2021-09-21 DIAGNOSIS — K7210 Chronic hepatic failure without coma: Secondary | ICD-10-CM | POA: Diagnosis present

## 2021-09-21 LAB — GRAM STAIN

## 2021-09-21 LAB — GLUCOSE, PLEURAL OR PERITONEAL FLUID: Glucose, Fluid: 83 mg/dL

## 2021-09-21 LAB — PROTEIN, PLEURAL OR PERITONEAL FLUID: Total protein, fluid: <3 g/dL

## 2021-09-21 NOTE — Procedures (Signed)
PreOperative Dx: Nonalcoholic cirrhosis, ascites Postoperative Dx: Nonalcoholic cirrhosis, ascites Procedure:   US guided paracentesis Radiologist:  Thornton Papas Anesthesia:  10 ml of1% lidocaine Specimen:  3.7L of amber ascitic fluid EBL:   < 1 ml Complications:  None

## 2021-09-21 NOTE — Progress Notes (Signed)
Pt tolerated right sided paracentesis procedure well today and 3.7 Liters of clear light amber colored fluid removed with labs collected and sent for processing. PT verbalized understanding of discharge instructions and ambulatory at departure with no acute distress noted.

## 2021-09-22 ENCOUNTER — Other Ambulatory Visit (HOSPITAL_COMMUNITY)
Admission: RE | Admit: 2021-09-22 | Discharge: 2021-09-22 | Disposition: A | Discharge status: Home / Self Care | Payer: Medicare Other | Attending: Internal Medicine | Admitting: Internal Medicine

## 2021-09-22 ENCOUNTER — Encounter (HOSPITAL_COMMUNITY): Payer: Self-pay | Admitting: *Deleted

## 2021-09-22 ENCOUNTER — Other Ambulatory Visit (INDEPENDENT_AMBULATORY_CARE_PROVIDER_SITE_OTHER): Payer: Self-pay

## 2021-09-22 ENCOUNTER — Inpatient Hospital Stay (HOSPITAL_COMMUNITY)
Admission: EM | Admit: 2021-09-22 | Discharge: 2021-09-24 | DRG: 442 | Disposition: A | Discharge status: Home / Self Care | Payer: Medicare Other | Attending: Internal Medicine | Admitting: Internal Medicine

## 2021-09-22 ENCOUNTER — Ambulatory Visit (INDEPENDENT_AMBULATORY_CARE_PROVIDER_SITE_OTHER): Payer: Medicare Other | Admitting: Internal Medicine

## 2021-09-22 ENCOUNTER — Encounter (INDEPENDENT_AMBULATORY_CARE_PROVIDER_SITE_OTHER): Payer: Self-pay | Admitting: Internal Medicine

## 2021-09-22 ENCOUNTER — Other Ambulatory Visit: Payer: Self-pay | Admitting: Internal Medicine

## 2021-09-22 ENCOUNTER — Other Ambulatory Visit: Payer: Self-pay

## 2021-09-22 VITALS — BP 94/59 | HR 90 | Temp 97.7°F | Ht 67.0 in | Wt 213.0 lb

## 2021-09-22 DIAGNOSIS — I8510 Secondary esophageal varices without bleeding: Secondary | ICD-10-CM | POA: Diagnosis present

## 2021-09-22 DIAGNOSIS — D61818 Other pancytopenia: Secondary | ICD-10-CM | POA: Diagnosis present

## 2021-09-22 DIAGNOSIS — D649 Anemia, unspecified: Secondary | ICD-10-CM | POA: Insufficient documentation

## 2021-09-22 DIAGNOSIS — E039 Hypothyroidism, unspecified: Secondary | ICD-10-CM | POA: Insufficient documentation

## 2021-09-22 DIAGNOSIS — K7460 Unspecified cirrhosis of liver: Secondary | ICD-10-CM

## 2021-09-22 DIAGNOSIS — M1990 Unspecified osteoarthritis, unspecified site: Secondary | ICD-10-CM | POA: Diagnosis present

## 2021-09-22 DIAGNOSIS — I25118 Atherosclerotic heart disease of native coronary artery with other forms of angina pectoris: Secondary | ICD-10-CM

## 2021-09-22 DIAGNOSIS — R188 Other ascites: Secondary | ICD-10-CM | POA: Insufficient documentation

## 2021-09-22 DIAGNOSIS — K922 Gastrointestinal hemorrhage, unspecified: Secondary | ICD-10-CM | POA: Diagnosis not present

## 2021-09-22 DIAGNOSIS — K7581 Nonalcoholic steatohepatitis (NASH): Secondary | ICD-10-CM | POA: Insufficient documentation

## 2021-09-22 DIAGNOSIS — N189 Chronic kidney disease, unspecified: Secondary | ICD-10-CM | POA: Diagnosis present

## 2021-09-22 DIAGNOSIS — K7682 Hepatic encephalopathy: Secondary | ICD-10-CM | POA: Diagnosis not present

## 2021-09-22 DIAGNOSIS — Z79899 Other long term (current) drug therapy: Secondary | ICD-10-CM

## 2021-09-22 DIAGNOSIS — Z79890 Hormone replacement therapy (postmenopausal): Secondary | ICD-10-CM

## 2021-09-22 DIAGNOSIS — R9431 Abnormal electrocardiogram [ECG] [EKG]: Secondary | ICD-10-CM | POA: Diagnosis not present

## 2021-09-22 DIAGNOSIS — D500 Iron deficiency anemia secondary to blood loss (chronic): Secondary | ICD-10-CM | POA: Diagnosis present

## 2021-09-22 DIAGNOSIS — E785 Hyperlipidemia, unspecified: Secondary | ICD-10-CM | POA: Diagnosis present

## 2021-09-22 DIAGNOSIS — G4730 Sleep apnea, unspecified: Secondary | ICD-10-CM | POA: Diagnosis present

## 2021-09-22 DIAGNOSIS — Z87442 Personal history of urinary calculi: Secondary | ICD-10-CM

## 2021-09-22 DIAGNOSIS — Z66 Do not resuscitate: Secondary | ICD-10-CM | POA: Diagnosis present

## 2021-09-22 DIAGNOSIS — I2510 Atherosclerotic heart disease of native coronary artery without angina pectoris: Secondary | ICD-10-CM | POA: Diagnosis present

## 2021-09-22 DIAGNOSIS — K7210 Chronic hepatic failure without coma: Secondary | ICD-10-CM | POA: Diagnosis present

## 2021-09-22 DIAGNOSIS — K3189 Other diseases of stomach and duodenum: Secondary | ICD-10-CM | POA: Diagnosis present

## 2021-09-22 DIAGNOSIS — I739 Peripheral vascular disease, unspecified: Secondary | ICD-10-CM | POA: Diagnosis present

## 2021-09-22 DIAGNOSIS — I252 Old myocardial infarction: Secondary | ICD-10-CM

## 2021-09-22 DIAGNOSIS — K766 Portal hypertension: Principal | ICD-10-CM | POA: Diagnosis present

## 2021-09-22 LAB — CBC WITH DIFFERENTIAL/PLATELET
Abs Immature Granulocytes: 0.02 10*3/uL (ref 0.00–0.07)
Basophils Absolute: 0 10*3/uL (ref 0.0–0.1)
Basophils Relative: 1 %
Eosinophils Absolute: 0 10*3/uL (ref 0.0–0.5)
Eosinophils Relative: 0 %
HCT: 19.8 % — ABNORMAL LOW (ref 39.0–52.0)
Hemoglobin: 6.3 g/dL — CL (ref 13.0–17.0)
Immature Granulocytes: 1 %
Lymphocytes Relative: 17 %
Lymphs Abs: 0.6 10*3/uL — ABNORMAL LOW (ref 0.7–4.0)
MCH: 35.4 pg — ABNORMAL HIGH (ref 26.0–34.0)
MCHC: 31.8 g/dL (ref 30.0–36.0)
MCV: 111.2 fL — ABNORMAL HIGH (ref 80.0–100.0)
Monocytes Absolute: 0.9 10*3/uL (ref 0.1–1.0)
Monocytes Relative: 24 %
Neutro Abs: 2.2 10*3/uL (ref 1.7–7.7)
Neutrophils Relative %: 57 %
Platelets: 63 10*3/uL — ABNORMAL LOW (ref 150–400)
RBC: 1.78 MIL/uL — ABNORMAL LOW (ref 4.22–5.81)
RDW: 18.4 % — ABNORMAL HIGH (ref 11.5–15.5)
WBC: 3.8 10*3/uL — ABNORMAL LOW (ref 4.0–10.5)
nRBC: 0 % (ref 0.0–0.2)

## 2021-09-22 LAB — COMPREHENSIVE METABOLIC PANEL
ALT: 80 U/L — ABNORMAL HIGH (ref 0–44)
AST: 154 U/L — ABNORMAL HIGH (ref 15–41)
Albumin: <1.5 g/dL — ABNORMAL LOW (ref 3.5–5.0)
Alkaline Phosphatase: 110 U/L (ref 38–126)
Anion gap: 0 — ABNORMAL LOW (ref 5–15)
BUN: 45 mg/dL — ABNORMAL HIGH (ref 8–23)
CO2: 23 mmol/L (ref 22–32)
Calcium: 8.1 mg/dL — ABNORMAL LOW (ref 8.9–10.3)
Chloride: 105 mmol/L (ref 98–111)
Creatinine, Ser: 1.54 mg/dL — ABNORMAL HIGH (ref 0.61–1.24)
GFR, Estimated: 48 mL/min — ABNORMAL LOW (ref >60)
Glucose, Bld: 100 mg/dL — ABNORMAL HIGH (ref 70–99)
Potassium: 4 mmol/L (ref 3.5–5.1)
Sodium: 128 mmol/L — ABNORMAL LOW (ref 135–145)
Total Bilirubin: 4.1 mg/dL — ABNORMAL HIGH (ref 0.3–1.2)
Total Protein: 4.4 g/dL — ABNORMAL LOW (ref 6.5–8.1)

## 2021-09-22 LAB — CBC
HCT: 21.2 % — ABNORMAL LOW (ref 39.0–52.0)
Hemoglobin: 6.6 g/dL — CL (ref 13.0–17.0)
MCH: 34.9 pg — ABNORMAL HIGH (ref 26.0–34.0)
MCHC: 31.1 g/dL (ref 30.0–36.0)
MCV: 112.2 fL — ABNORMAL HIGH (ref 80.0–100.0)
Platelets: 71 10*3/uL — ABNORMAL LOW (ref 150–400)
RBC: 1.89 MIL/uL — ABNORMAL LOW (ref 4.22–5.81)
RDW: 18.7 % — ABNORMAL HIGH (ref 11.5–15.5)
WBC: 4.4 10*3/uL (ref 4.0–10.5)
nRBC: 0 % (ref 0.0–0.2)

## 2021-09-22 LAB — PROTIME-INR
INR: 2.6 — ABNORMAL HIGH (ref 0.8–1.2)
Prothrombin Time: 27.7 seconds — ABNORMAL HIGH (ref 11.4–15.2)

## 2021-09-22 LAB — PREPARE RBC (CROSSMATCH)

## 2021-09-22 LAB — POC OCCULT BLOOD, ED: Fecal Occult Bld: POSITIVE — AB

## 2021-09-22 MED ORDER — RIFAXIMIN 550 MG PO TABS
550.0000 mg | ORAL_TABLET | Freq: Two times a day (BID) | ORAL | Status: DC
  Administered 2021-09-23 – 2021-09-24 (×3): 550 mg via ORAL
  Filled 2021-09-22 (×4): qty 1

## 2021-09-22 MED ORDER — ALBUMIN HUMAN 25 % IV SOLN: 50.0000 g | Freq: Once | INTRAVENOUS | Status: DC

## 2021-09-22 MED ORDER — LEVOTHYROXINE SODIUM 75 MCG PO TABS
37.5000 ug | ORAL_TABLET | Freq: Every day | ORAL | Status: DC
  Administered 2021-09-23 – 2021-09-24 (×2): 37.5 ug via ORAL
  Filled 2021-09-22 (×2): qty 1

## 2021-09-22 MED ORDER — PANTOPRAZOLE SODIUM 40 MG IV SOLR
40.0000 mg | Freq: Once | INTRAVENOUS | Status: AC
Start: 2021-09-22 — End: 2021-09-22
  Administered 2021-09-22: 40 mg via INTRAVENOUS
  Filled 2021-09-22: qty 10

## 2021-09-22 MED ORDER — ATORVASTATIN CALCIUM 20 MG PO TABS
20.0000 mg | ORAL_TABLET | Freq: Every day | ORAL | Status: DC
  Administered 2021-09-23: 20 mg via ORAL
  Filled 2021-09-22 (×2): qty 1

## 2021-09-22 MED ORDER — ACETAMINOPHEN 325 MG PO TABS: 650.0000 mg | ORAL_TABLET | Freq: Four times a day (QID) | ORAL | Status: DC | PRN

## 2021-09-22 MED ORDER — ACETAMINOPHEN 650 MG RE SUPP: 650.0000 mg | Freq: Four times a day (QID) | RECTAL | Status: DC | PRN

## 2021-09-22 MED ORDER — ISOSORBIDE MONONITRATE ER 60 MG PO TB24
30.0000 mg | ORAL_TABLET | Freq: Every day | ORAL | Status: DC
  Administered 2021-09-23 – 2021-09-24 (×2): 30 mg via ORAL
  Filled 2021-09-22 (×2): qty 1

## 2021-09-22 MED ORDER — MORPHINE SULFATE (PF) 2 MG/ML IV SOLN: 2.0000 mg | INTRAVENOUS | Status: DC | PRN

## 2021-09-22 MED ORDER — ONDANSETRON HCL 4 MG PO TABS: 4.0000 mg | ORAL_TABLET | Freq: Four times a day (QID) | ORAL | Status: DC | PRN

## 2021-09-22 MED ORDER — OXYCODONE HCL 5 MG PO TABS: 5.0000 mg | ORAL_TABLET | ORAL | Status: DC | PRN

## 2021-09-22 MED ORDER — LEVOTHYROXINE SODIUM 75 MCG PO TABS: 37.5000 ug | ORAL_TABLET | Freq: Every day | ORAL | 0 refills | Status: DC

## 2021-09-22 MED ORDER — PANTOPRAZOLE SODIUM 40 MG IV SOLR
40.0000 mg | Freq: Two times a day (BID) | INTRAVENOUS | Status: DC
  Administered 2021-09-23 – 2021-09-24 (×3): 40 mg via INTRAVENOUS
  Filled 2021-09-22 (×3): qty 10

## 2021-09-22 MED ORDER — LACTULOSE 10 GM/15ML PO SOLN
20.0000 g | Freq: Three times a day (TID) | ORAL | Status: DC
  Administered 2021-09-23 – 2021-09-24 (×4): 20 g via ORAL
  Filled 2021-09-22 (×4): qty 30

## 2021-09-22 MED ORDER — ONDANSETRON HCL 4 MG/2ML IJ SOLN: 4.0000 mg | Freq: Four times a day (QID) | INTRAMUSCULAR | Status: DC | PRN

## 2021-09-22 MED ORDER — SODIUM CHLORIDE 0.9 % IV SOLN
10.0000 mL/h | Freq: Once | INTRAVENOUS | Status: AC
  Administered 2021-09-22: 10 mL/h via INTRAVENOUS

## 2021-09-22 MED ORDER — METOPROLOL TARTRATE 25 MG PO TABS
12.5000 mg | ORAL_TABLET | Freq: Two times a day (BID) | ORAL | Status: DC
  Administered 2021-09-23 – 2021-09-24 (×3): 12.5 mg via ORAL
  Filled 2021-09-22 (×4): qty 1

## 2021-09-22 NOTE — Progress Notes (Signed)
Presenting complaint;  Follow-up for decompensated liver disease. Follow-up for anemia and hypothyroidism.  Database and subjective:  Patient is 71 year old Caucasian male who is here for scheduled visit accompanied by his wife and their daughter [name].  He was last seen on 08/25/2021. He has cirrhosis secondary to NASH complicated by ascites requiring LVAP every 10 to 14 days, hepatic encephalopathy, 2 liver lesions concerning for HCC(patient not a candidate for liver transplant because of triple-vessel coronary artery disease) as well as chemotherapy or TACE given he has child Pugh C disease. He also has anemia.  He received 3 units of PRBCs when he was at [facility] between 07/16/2021 and 07/20/2021.  He was admitted for chest pain and did not undergo endoscopic evaluation because of his condition.  Patient complains of extreme weakness.  According to his wife he did walk some yesterday and today.  He denies chest pain or shortness of breath.  He says his appetite is good but at times he has cravings for certain foods.  He has 3 bowel movements daily.  2 of his bowel movements are large.  Stool is dark brown but not black.  No rectal bleeding reported.  He has gained 7 pounds since his last visit.  Patient underwent abdominal paracenteses yesterday with removal of 3.7 L of fluid.  Gram stain was negative for microorganisms.  Cultures so far has been negative. According to his daughter his abdomen was much less distended soon after he underwent abdominal tap yesterday with it has increased significantly since then.  Patient reports decrease in lower extremity edema while ascites has increased again. According to his wife he has not been confused.    Current Medications: Outpatient Encounter Medications as of 09/22/2021  Medication Sig   atorvastatin (LIPITOR) 20 MG tablet Take 20 mg by mouth at bedtime.    furosemide (LASIX) 20 MG tablet Take 1 tablet (20 mg total) by mouth daily.   lactulose  (CHRONULAC) 10 GM/15ML solution Take 30 mLs (20 g total) by mouth 3 (three) times daily. Goal is 2 bowel movements daily.   levothyroxine (SYNTHROID) 25 MCG tablet Take 1 tablet (25 mcg total) by mouth daily before breakfast.   metoprolol tartrate (LOPRESSOR) 25 MG tablet Take 0.5 tablets (12.5 mg total) by mouth 2 (two) times daily.   Omega-3 Fatty Acids (OMEGA 3 500 PO) Take 500 mg by mouth at bedtime.   pantoprazole (PROTONIX) 40 MG tablet Take 1 tablet (40 mg total) by mouth daily before breakfast.   rifaximin (XIFAXAN) 550 MG TABS tablet Take 1 tablet (550 mg total) by mouth 2 (two) times daily. (Patient taking differently: Take 550 mg by mouth 2 (two) times daily. Continuously)   zinc gluconate 50 MG tablet Take 50 mg by mouth daily.   No facility-administered encounter medications on file as of 09/22/2021.     Objective: Blood pressure (!) 94/59, pulse 90, temperature 97.7 F (36.5 C), temperature source Oral, height 5' 7"  (1.702 m), weight 213 lb (96.6 kg). Patient is alert and in no acute distress. He does not have asterixis. Conjunctiva is pale.  Sclera i does not appear to be yellow in artificial light. Oropharyngeal mucosa is normal. No neck masses or thyromegaly noted. Cardiac exam with regular rhythm normal S1 and S2.  Grade 2 or 6 systolic murmur best heard at aortic area. Lungs are clear to auscultation. Abdomen is distended but not tense.  Flanks are dull.  He has Band-Aid over puncture site and right side of his abdomen.  There is no fluid seepage.  On palpation abdomen is nontender.  No organomegaly or masses.  Pitting edema noted to skin in both flanks. He does not have clubbing or koilonychia. He has 2+ pitting edema involving both legs.   Labs/studies Results:      Latest Ref Rng & Units 09/17/2021   10:06 AM 08/20/2021   10:18 AM 07/28/2021    3:46 PM  CBC  WBC 3.8 - 10.8 Thousand/uL 3.9   5.5   4.5    Hemoglobin 13.2 - 17.1 g/dL 7.7   8.6   9.1    Hematocrit  38.5 - 50.0 % 22.4   25.5   27.0    Platelets 140 - 400 Thousand/uL 53   56   56         Latest Ref Rng & Units 09/17/2021   10:06 AM 08/20/2021   10:18 AM 07/28/2021    3:46 PM  CMP  Glucose 65 - 99 mg/dL 73   92   93    BUN 7 - 25 mg/dL 35   31   27    Creatinine 0.70 - 1.28 mg/dL 1.54   1.44   1.56    Sodium 135 - 146 mmol/L 133   134   136    Potassium 3.5 - 5.3 mmol/L 4.2   4.1   4.2    Chloride 98 - 110 mmol/L 104   102   102    CO2 20 - 32 mmol/L 24   29   28     Calcium 8.6 - 10.3 mg/dL 7.9   7.8   8.2    Total Protein 6.1 - 8.1 g/dL 4.6   4.6   4.4    Total Bilirubin 0.2 - 1.2 mg/dL 3.9   4.5   5.7    AST 10 - 35 U/L 124   420   64    ALT 9 - 46 U/L 64   120   37         Latest Ref Rng & Units 09/17/2021   10:06 AM 08/20/2021   10:18 AM 07/28/2021    3:46 PM  Hepatic Function  Total Protein 6.1 - 8.1 g/dL 4.6   4.6   4.4    AST 10 - 35 U/L 124   420   64    ALT 9 - 46 U/L 64   120   37    Total Bilirubin 0.2 - 1.2 mg/dL 3.9   4.5   5.7      TSH on 09/17/2021 5.60  INR 1.8.  Assessment:  #1.  Cirrhotic ascites.  He has undergone 5 abdominal paracenteses in the last 9 weeks.  His ascites is rapidly reaccumulating.  He would need another tap next week.  He is on low-dose furosemide.  Role of diuretic therapy is limited because of chronic kidney disease and worsening GFR.  I do not feel furosemide at this dose is helping and would discontinue it.  #2.  Hepatic encephalopathy.  He appears to be at baseline while on lactulose and Xifaxan.  #3.  Anemia.  His hemoglobin has been gradually decreasing since he had blood transfusion about 9 weeks ago.  Hemoglobin last week was 7.7 g.  Anemia may be contributing to his profound weakness.  I suspect anemia as multifactorial including GI blood loss.  No history of overt or gross bleeding.  Therefore variceal bleeding unlikely.  He could be losing blood from portal gastropathy  or he could have GI angiodysplasia.  If stool is guaiac positive  he would benefit from esophagogastroduodenoscopy.  #4.  Decompensated cirrhosis.  Patient was being followed at [facility] for potential liver transplant.  He suffered MI while undergoing abdominal paracenteses back in March 2023.  He underwent urgent cardiac cath which revealed triple-vessel coronary artery disease that essentially ruled him out as a transplant candidate.  His albumin is very low.  He would benefit from albumin infusion which will be arranged on an outpatient basis. He has been referred to Dr. Ruthann Cancer of IR for evaluation regarding portal vein thrombus.  If portal circulation can be reestablished there is a chance that his hepatic function might improve and ascites may be more manageable.  Patient or his daughter have not heard from their office.  His daughter [name] would contact their office and find find out his appointment date.  #4.  Hypothyroidism.  TSH is elevated.  He would benefit from higher dose of levothyroxine.   Plan:  Discontinue furosemide. Hemoccult x1. Patient will go to any pain lab for CBC and type and screen. If hemoglobin is less than 7.7 g will proceed with transfusion. Increase levothyroxine dose to 37.5 mcg p.o. every morning.  Prescription given for 75 mcg and he will take half a pill. We will arrange for albumin infusion either this or next week.  Dose would be 50 g daily x3 doses. Would also recommend albumin infusion at the time of present disease. Office visit in 4 weeks.

## 2021-09-22 NOTE — ED Notes (Signed)
Critical value Hemoglobin 6.3 reported to [place] at 2155. Transfuse orders to be placed.

## 2021-09-22 NOTE — ED Notes (Signed)
Pt states he was type and screened during his visit with dr Laural Golden. Called lab regarding current order, they stated that as long as he still has his bracelet from earlier today then it can be reused. Repeat type and screen not collected

## 2021-09-22 NOTE — Patient Instructions (Signed)
CBC and type and screen to be done at [facility] lab. Discontinue furosemide. Will arrange for albumin infusion 50 g x 3 doses. Will arrange for next abdominal tap on 10/07/2021.  Please call Dr. Ruthann Cancer office to find out office visit time and date

## 2021-09-22 NOTE — Plan of Care (Signed)

## 2021-09-22 NOTE — ED Triage Notes (Signed)
Pt sent here by Dr. Laural Golden for an admission.

## 2021-09-22 NOTE — H&P (View-Only) (Signed)
Presenting complaint;  Follow-up for decompensated liver disease. Follow-up for anemia and hypothyroidism.  Database and subjective:  Patient is 71 year old Caucasian male who is here for scheduled visit accompanied by his wife and their daughter [name].  He was last seen on 08/25/2021. He has cirrhosis secondary to NASH complicated by ascites requiring LVAP every 10 to 14 days, hepatic encephalopathy, 2 liver lesions concerning for HCC(patient not a candidate for liver transplant because of triple-vessel coronary artery disease) as well as chemotherapy or TACE given he has child Pugh C disease. He also has anemia.  He received 3 units of PRBCs when he was at [facility] between 07/16/2021 and 07/20/2021.  He was admitted for chest pain and did not undergo endoscopic evaluation because of his condition.  Patient complains of extreme weakness.  According to his wife he did walk some yesterday and today.  He denies chest pain or shortness of breath.  He says his appetite is good but at times he has cravings for certain foods.  He has 3 bowel movements daily.  2 of his bowel movements are large.  Stool is dark brown but not black.  No rectal bleeding reported.  He has gained 7 pounds since his last visit.  Patient underwent abdominal paracenteses yesterday with removal of 3.7 L of fluid.  Gram stain was negative for microorganisms.  Cultures so far has been negative. According to his daughter his abdomen was much less distended soon after he underwent abdominal tap yesterday with it has increased significantly since then.  Patient reports decrease in lower extremity edema while ascites has increased again. According to his wife he has not been confused.    Current Medications: Outpatient Encounter Medications as of 09/22/2021  Medication Sig   atorvastatin (LIPITOR) 20 MG tablet Take 20 mg by mouth at bedtime.    furosemide (LASIX) 20 MG tablet Take 1 tablet (20 mg total) by mouth daily.   lactulose  (CHRONULAC) 10 GM/15ML solution Take 30 mLs (20 g total) by mouth 3 (three) times daily. Goal is 2 bowel movements daily.   levothyroxine (SYNTHROID) 25 MCG tablet Take 1 tablet (25 mcg total) by mouth daily before breakfast.   metoprolol tartrate (LOPRESSOR) 25 MG tablet Take 0.5 tablets (12.5 mg total) by mouth 2 (two) times daily.   Omega-3 Fatty Acids (OMEGA 3 500 PO) Take 500 mg by mouth at bedtime.   pantoprazole (PROTONIX) 40 MG tablet Take 1 tablet (40 mg total) by mouth daily before breakfast.   rifaximin (XIFAXAN) 550 MG TABS tablet Take 1 tablet (550 mg total) by mouth 2 (two) times daily. (Patient taking differently: Take 550 mg by mouth 2 (two) times daily. Continuously)   zinc gluconate 50 MG tablet Take 50 mg by mouth daily.   No facility-administered encounter medications on file as of 09/22/2021.     Objective: Blood pressure (!) 94/59, pulse 90, temperature 97.7 F (36.5 C), temperature source Oral, height 5' 7"  (1.702 m), weight 213 lb (96.6 kg). Patient is alert and in no acute distress. He does not have asterixis. Conjunctiva is pale.  Sclera i does not appear to be yellow in artificial light. Oropharyngeal mucosa is normal. No neck masses or thyromegaly noted. Cardiac exam with regular rhythm normal S1 and S2.  Grade 2 or 6 systolic murmur best heard at aortic area. Lungs are clear to auscultation. Abdomen is distended but not tense.  Flanks are dull.  He has Band-Aid over puncture site and right side of his abdomen.  There is no fluid seepage.  On palpation abdomen is nontender.  No organomegaly or masses.  Pitting edema noted to skin in both flanks. He does not have clubbing or koilonychia. He has 2+ pitting edema involving both legs.   Labs/studies Results:      Latest Ref Rng & Units 09/17/2021   10:06 AM 08/20/2021   10:18 AM 07/28/2021    3:46 PM  CBC  WBC 3.8 - 10.8 Thousand/uL 3.9   5.5   4.5    Hemoglobin 13.2 - 17.1 g/dL 7.7   8.6   9.1    Hematocrit  38.5 - 50.0 % 22.4   25.5   27.0    Platelets 140 - 400 Thousand/uL 53   56   56         Latest Ref Rng & Units 09/17/2021   10:06 AM 08/20/2021   10:18 AM 07/28/2021    3:46 PM  CMP  Glucose 65 - 99 mg/dL 73   92   93    BUN 7 - 25 mg/dL 35   31   27    Creatinine 0.70 - 1.28 mg/dL 1.54   1.44   1.56    Sodium 135 - 146 mmol/L 133   134   136    Potassium 3.5 - 5.3 mmol/L 4.2   4.1   4.2    Chloride 98 - 110 mmol/L 104   102   102    CO2 20 - 32 mmol/L 24   29   28     Calcium 8.6 - 10.3 mg/dL 7.9   7.8   8.2    Total Protein 6.1 - 8.1 g/dL 4.6   4.6   4.4    Total Bilirubin 0.2 - 1.2 mg/dL 3.9   4.5   5.7    AST 10 - 35 U/L 124   420   64    ALT 9 - 46 U/L 64   120   37         Latest Ref Rng & Units 09/17/2021   10:06 AM 08/20/2021   10:18 AM 07/28/2021    3:46 PM  Hepatic Function  Total Protein 6.1 - 8.1 g/dL 4.6   4.6   4.4    AST 10 - 35 U/L 124   420   64    ALT 9 - 46 U/L 64   120   37    Total Bilirubin 0.2 - 1.2 mg/dL 3.9   4.5   5.7      TSH on 09/17/2021 5.60  INR 1.8.  Assessment:  #1.  Cirrhotic ascites.  He has undergone 5 abdominal paracenteses in the last 9 weeks.  His ascites is rapidly reaccumulating.  He would need another tap next week.  He is on low-dose furosemide.  Role of diuretic therapy is limited because of chronic kidney disease and worsening GFR.  I do not feel furosemide at this dose is helping and would discontinue it.  #2.  Hepatic encephalopathy.  He appears to be at baseline while on lactulose and Xifaxan.  #3.  Anemia.  His hemoglobin has been gradually decreasing since he had blood transfusion about 9 weeks ago.  Hemoglobin last week was 7.7 g.  Anemia may be contributing to his profound weakness.  I suspect anemia as multifactorial including GI blood loss.  No history of overt or gross bleeding.  Therefore variceal bleeding unlikely.  He could be losing blood from portal gastropathy  or he could have GI angiodysplasia.  If stool is guaiac positive  he would benefit from esophagogastroduodenoscopy.  #4.  Decompensated cirrhosis.  Patient was being followed at [facility] for potential liver transplant.  He suffered MI while undergoing abdominal paracenteses back in March 2023.  He underwent urgent cardiac cath which revealed triple-vessel coronary artery disease that essentially ruled him out as a transplant candidate.  His albumin is very low.  He would benefit from albumin infusion which will be arranged on an outpatient basis. He has been referred to Dr. Ruthann Cancer of IR for evaluation regarding portal vein thrombus.  If portal circulation can be reestablished there is a chance that his hepatic function might improve and ascites may be more manageable.  Patient or his daughter have not heard from their office.  His daughter [name] would contact their office and find find out his appointment date.  #4.  Hypothyroidism.  TSH is elevated.  He would benefit from higher dose of levothyroxine.   Plan:  Discontinue furosemide. Hemoccult x1. Patient will go to any pain lab for CBC and type and screen. If hemoglobin is less than 7.7 g will proceed with transfusion. Increase levothyroxine dose to 37.5 mcg p.o. every morning.  Prescription given for 75 mcg and he will take half a pill. We will arrange for albumin infusion either this or next week.  Dose would be 50 g daily x3 doses. Would also recommend albumin infusion at the time of present disease. Office visit in 4 weeks.

## 2021-09-23 ENCOUNTER — Encounter (HOSPITAL_COMMUNITY): Payer: Self-pay | Admitting: Family Medicine

## 2021-09-23 ENCOUNTER — Other Ambulatory Visit (INDEPENDENT_AMBULATORY_CARE_PROVIDER_SITE_OTHER): Payer: Self-pay

## 2021-09-23 ENCOUNTER — Encounter (HOSPITAL_COMMUNITY)
Admission: EM | Disposition: A | Discharge status: Home / Self Care | Payer: Self-pay | Source: Home / Self Care | Attending: Internal Medicine

## 2021-09-23 ENCOUNTER — Observation Stay (HOSPITAL_COMMUNITY): Payer: Medicare Other | Admitting: Certified Registered Nurse Anesthetist

## 2021-09-23 ENCOUNTER — Observation Stay (HOSPITAL_BASED_OUTPATIENT_CLINIC_OR_DEPARTMENT_OTHER): Payer: Medicare Other | Admitting: Certified Registered Nurse Anesthetist

## 2021-09-23 DIAGNOSIS — K7460 Unspecified cirrhosis of liver: Secondary | ICD-10-CM | POA: Diagnosis present

## 2021-09-23 DIAGNOSIS — E039 Hypothyroidism, unspecified: Secondary | ICD-10-CM

## 2021-09-23 DIAGNOSIS — R188 Other ascites: Secondary | ICD-10-CM | POA: Diagnosis present

## 2021-09-23 DIAGNOSIS — K766 Portal hypertension: Secondary | ICD-10-CM | POA: Diagnosis not present

## 2021-09-23 DIAGNOSIS — K3189 Other diseases of stomach and duodenum: Secondary | ICD-10-CM

## 2021-09-23 DIAGNOSIS — I8511 Secondary esophageal varices with bleeding: Secondary | ICD-10-CM | POA: Diagnosis not present

## 2021-09-23 DIAGNOSIS — D61818 Other pancytopenia: Secondary | ICD-10-CM | POA: Diagnosis present

## 2021-09-23 DIAGNOSIS — Z79890 Hormone replacement therapy (postmenopausal): Secondary | ICD-10-CM | POA: Diagnosis not present

## 2021-09-23 DIAGNOSIS — I739 Peripheral vascular disease, unspecified: Secondary | ICD-10-CM | POA: Diagnosis present

## 2021-09-23 DIAGNOSIS — I252 Old myocardial infarction: Secondary | ICD-10-CM | POA: Diagnosis not present

## 2021-09-23 DIAGNOSIS — K922 Gastrointestinal hemorrhage, unspecified: Secondary | ICD-10-CM | POA: Diagnosis present

## 2021-09-23 DIAGNOSIS — I8510 Secondary esophageal varices without bleeding: Secondary | ICD-10-CM | POA: Diagnosis present

## 2021-09-23 DIAGNOSIS — I25118 Atherosclerotic heart disease of native coronary artery with other forms of angina pectoris: Secondary | ICD-10-CM

## 2021-09-23 DIAGNOSIS — Z87442 Personal history of urinary calculi: Secondary | ICD-10-CM | POA: Diagnosis not present

## 2021-09-23 DIAGNOSIS — E785 Hyperlipidemia, unspecified: Secondary | ICD-10-CM | POA: Diagnosis present

## 2021-09-23 DIAGNOSIS — K7581 Nonalcoholic steatohepatitis (NASH): Secondary | ICD-10-CM | POA: Diagnosis present

## 2021-09-23 DIAGNOSIS — D649 Anemia, unspecified: Secondary | ICD-10-CM

## 2021-09-23 DIAGNOSIS — I2510 Atherosclerotic heart disease of native coronary artery without angina pectoris: Secondary | ICD-10-CM | POA: Diagnosis present

## 2021-09-23 DIAGNOSIS — Z79899 Other long term (current) drug therapy: Secondary | ICD-10-CM | POA: Diagnosis not present

## 2021-09-23 DIAGNOSIS — K7210 Chronic hepatic failure without coma: Secondary | ICD-10-CM | POA: Diagnosis present

## 2021-09-23 DIAGNOSIS — N189 Chronic kidney disease, unspecified: Secondary | ICD-10-CM | POA: Diagnosis present

## 2021-09-23 DIAGNOSIS — D500 Iron deficiency anemia secondary to blood loss (chronic): Secondary | ICD-10-CM

## 2021-09-23 DIAGNOSIS — G4730 Sleep apnea, unspecified: Secondary | ICD-10-CM | POA: Diagnosis present

## 2021-09-23 DIAGNOSIS — K7682 Hepatic encephalopathy: Secondary | ICD-10-CM | POA: Diagnosis present

## 2021-09-23 DIAGNOSIS — Z66 Do not resuscitate: Secondary | ICD-10-CM | POA: Diagnosis present

## 2021-09-23 DIAGNOSIS — M1990 Unspecified osteoarthritis, unspecified site: Secondary | ICD-10-CM | POA: Diagnosis present

## 2021-09-23 DIAGNOSIS — I8500 Esophageal varices without bleeding: Secondary | ICD-10-CM | POA: Diagnosis not present

## 2021-09-23 HISTORY — PX: ESOPHAGOGASTRODUODENOSCOPY (EGD) WITH PROPOFOL: SHX5813

## 2021-09-23 LAB — CBC
HCT: 25.8 % — ABNORMAL LOW (ref 39.0–52.0)
HCT: 25.1 % — ABNORMAL LOW (ref 39.0–52.0)
HCT: 26.7 % — ABNORMAL LOW (ref 39.0–52.0)
Hemoglobin: 8.4 g/dL — ABNORMAL LOW (ref 13.0–17.0)
Hemoglobin: 8.1 g/dL — ABNORMAL LOW (ref 13.0–17.0)
Hemoglobin: 8.6 g/dL — ABNORMAL LOW (ref 13.0–17.0)
MCH: 33.5 pg (ref 26.0–34.0)
MCH: 33.2 pg (ref 26.0–34.0)
MCH: 33.6 pg (ref 26.0–34.0)
MCHC: 32.6 g/dL (ref 30.0–36.0)
MCHC: 32.3 g/dL (ref 30.0–36.0)
MCHC: 32.2 g/dL (ref 30.0–36.0)
MCV: 102.8 fL — ABNORMAL HIGH (ref 80.0–100.0)
MCV: 102.9 fL — ABNORMAL HIGH (ref 80.0–100.0)
MCV: 104.3 fL — ABNORMAL HIGH (ref 80.0–100.0)
Platelets: 51 10*3/uL — ABNORMAL LOW (ref 150–400)
Platelets: 54 10*3/uL — ABNORMAL LOW (ref 150–400)
Platelets: 53 10*3/uL — ABNORMAL LOW (ref 150–400)
RBC: 2.51 MIL/uL — ABNORMAL LOW (ref 4.22–5.81)
RBC: 2.44 MIL/uL — ABNORMAL LOW (ref 4.22–5.81)
RBC: 2.56 MIL/uL — ABNORMAL LOW (ref 4.22–5.81)
RDW: 20.8 % — ABNORMAL HIGH (ref 11.5–15.5)
RDW: 21.2 % — ABNORMAL HIGH (ref 11.5–15.5)
RDW: 21.8 % — ABNORMAL HIGH (ref 11.5–15.5)
WBC: 3.2 10*3/uL — ABNORMAL LOW (ref 4.0–10.5)
WBC: 3.5 10*3/uL — ABNORMAL LOW (ref 4.0–10.5)
WBC: 3.9 10*3/uL — ABNORMAL LOW (ref 4.0–10.5)
nRBC: 0 % (ref 0.0–0.2)
nRBC: 0 % (ref 0.0–0.2)
nRBC: 0 % (ref 0.0–0.2)

## 2021-09-23 LAB — COMPREHENSIVE METABOLIC PANEL
ALT: 75 U/L — ABNORMAL HIGH (ref 0–44)
AST: 147 U/L — ABNORMAL HIGH (ref 15–41)
Albumin: <1.5 g/dL — ABNORMAL LOW (ref 3.5–5.0)
Alkaline Phosphatase: 107 U/L (ref 38–126)
Anion gap: 2 — ABNORMAL LOW (ref 5–15)
BUN: 46 mg/dL — ABNORMAL HIGH (ref 8–23)
CO2: 24 mmol/L (ref 22–32)
Calcium: 8.3 mg/dL — ABNORMAL LOW (ref 8.9–10.3)
Chloride: 107 mmol/L (ref 98–111)
Creatinine, Ser: 1.4 mg/dL — ABNORMAL HIGH (ref 0.61–1.24)
GFR, Estimated: 54 mL/min — ABNORMAL LOW (ref >60)
Glucose, Bld: 79 mg/dL (ref 70–99)
Potassium: 4.1 mmol/L (ref 3.5–5.1)
Sodium: 133 mmol/L — ABNORMAL LOW (ref 135–145)
Total Bilirubin: 5.5 mg/dL — ABNORMAL HIGH (ref 0.3–1.2)
Total Protein: 4.2 g/dL — ABNORMAL LOW (ref 6.5–8.1)

## 2021-09-23 LAB — RETICULOCYTES
Immature Retic Fract: 27.8 % — ABNORMAL HIGH (ref 2.3–15.9)
RBC.: 2.49 MIL/uL — ABNORMAL LOW (ref 4.22–5.81)
Retic Count, Absolute: 128.5 10*3/uL (ref 19.0–186.0)
Retic Ct Pct: 5.2 % — ABNORMAL HIGH (ref 0.4–3.1)

## 2021-09-23 LAB — FOLATE: Folate: 11.6 ng/mL (ref >5.9)

## 2021-09-23 LAB — IRON AND TIBC
Iron: 140 ug/dL (ref 45–182)
Saturation Ratios: 88 % — ABNORMAL HIGH (ref 17.9–39.5)
TIBC: 158 ug/dL — ABNORMAL LOW (ref 250–450)
UIBC: 18 ug/dL

## 2021-09-23 LAB — VITAMIN B12: Vitamin B-12: 3756 pg/mL — ABNORMAL HIGH (ref 180–914)

## 2021-09-23 LAB — MAGNESIUM: Magnesium: 2.1 mg/dL (ref 1.7–2.4)

## 2021-09-23 LAB — FERRITIN: Ferritin: 83 ng/mL (ref 24–336)

## 2021-09-23 LAB — TSH: TSH: 6.205 u[IU]/mL — ABNORMAL HIGH (ref 0.350–4.500)

## 2021-09-23 LAB — AMMONIA: Ammonia: 34 umol/L (ref 9–35)

## 2021-09-23 LAB — GLUCOSE, CAPILLARY: Glucose-Capillary: 99 mg/dL (ref 70–99)

## 2021-09-23 SURGERY — ESOPHAGOGASTRODUODENOSCOPY (EGD) WITH PROPOFOL
Anesthesia: General

## 2021-09-23 MED ORDER — LIDOCAINE HCL (CARDIAC) PF 100 MG/5ML IV SOSY
PREFILLED_SYRINGE | INTRAVENOUS | Status: DC | PRN
  Administered 2021-09-23: 60 mg via INTRAVENOUS

## 2021-09-23 MED ORDER — LACTATED RINGERS IV SOLN: INTRAVENOUS | Status: DC

## 2021-09-23 MED ORDER — ALBUMIN HUMAN 25 % IV SOLN: 50.0000 g | Freq: Once | INTRAVENOUS | Status: DC

## 2021-09-23 MED ORDER — EPHEDRINE SULFATE (PRESSORS) 50 MG/ML IJ SOLN
INTRAMUSCULAR | Status: DC | PRN
  Administered 2021-09-23: 10 mg via INTRAVENOUS
  Administered 2021-09-23: 5 mg via INTRAVENOUS
  Administered 2021-09-23: 10 mg via INTRAVENOUS

## 2021-09-23 MED ORDER — LACTATED RINGERS IV SOLN: INTRAVENOUS | Status: DC | PRN

## 2021-09-23 MED ORDER — SODIUM CHLORIDE 0.9 % IV SOLN: INTRAVENOUS | Status: DC

## 2021-09-23 MED ORDER — PROPOFOL 10 MG/ML IV BOLUS
INTRAVENOUS | Status: DC | PRN
  Administered 2021-09-23: 50 mg via INTRAVENOUS
  Administered 2021-09-23: 100 mg via INTRAVENOUS
  Administered 2021-09-23: 50 mg via INTRAVENOUS

## 2021-09-23 MED ORDER — ALBUMIN HUMAN 25 % IV SOLN
12.5000 g | Freq: Once | INTRAVENOUS | Status: AC
Start: 2021-09-23 — End: 2021-09-23
  Administered 2021-09-23: 12.5 g via INTRAVENOUS
  Filled 2021-09-23: qty 50

## 2021-09-23 NOTE — Assessment & Plan Note (Addendum)
-  Continue rifaximin -Continue lactulose -Check ammonia in the AM -Associated pancytopenia -transaminases at baseline

## 2021-09-23 NOTE — Progress Notes (Signed)
Patient admitted to the hospital earlier this morning by Drs. Clearence Ped  Patient seen and examined.  Denies any abdominal pain.  Reports that he was having dark stools.  He is admitted with GI bleeding.  He underwent upper endoscopy today.  Plans discussed with Dr. Melony Overly.  He was noted to be bleeding from gastric antrum as well as duodenum.  This was treated with hemostatic spray.  We will continue to follow hemoglobin and transfuse for hemoglobin less than 8.  He also received IV albumin.  [employer]

## 2021-09-23 NOTE — Assessment & Plan Note (Signed)
-  Continue imdur, metoprolol -No chest pain -EKG without acute ischemic changes

## 2021-09-23 NOTE — Assessment & Plan Note (Signed)
-  hgb 6.3 -currently transfusing two units -FOBT positive - Consult Gastro -Protonix BID - check cbc q 6H -end stage liver disease also contributing -anemia panel for completeness

## 2021-09-23 NOTE — Interval H&P Note (Signed)
Patient states he feels much better since he received blood transfusion.  He has not experienced hematemesis melena or rectal bleeding.  He has received 2 units of PRBCs. He is alert and in no acute distress. He is less pale compared to yesterday. No neck masses or thyromegaly. Cardiac exam regular rhythm normal S1 and S2.  Grade 2/6 systolic murmur best heard at aortic area.  Auscultation lungs reveal vesicular breath sounds bilaterally.  No rales or rhonchi. Abdomen is distended but not tense. 1-2+ pitting edema involving both legs.  Edema is decreased since I saw him in the office yesterday afternoon. Hemoglobin is 8.4 and INR is gone up to 2.6. Patient is agreeable to proceed with esophagogastroduodenoscopy.  History and Physical Interval Note:  09/23/2021 2:33 PM  [name]  has presented today for surgery, with the diagnosis of ANEMIA, HISTORY OF ESOPHAGEAL VARICES AND PORTAL HYPERTENSIVE GASTROPATHY.  The various methods of treatment have been discussed with the patient and family. After consideration of risks, benefits and other options for treatment, the patient has consented to  Procedure(s): ESOPHAGOGASTRODUODENOSCOPY (EGD) WITH PROPOFOL (N/A) as a surgical intervention.  The patient's history has been reviewed, patient examined, no change in status, stable for surgery.  I have reviewed the patient's chart and labs.  Questions were answered to the patient's satisfaction.     [employer]

## 2021-09-23 NOTE — Assessment & Plan Note (Signed)
Continue lipitor  ?

## 2021-09-23 NOTE — Anesthesia Preprocedure Evaluation (Signed)
Anesthesia Evaluation  Patient identified by MRN, date of birth, ID band Patient awake    Reviewed: Allergy & Precautions, H&P , NPO status , Patient's Chart, lab work & pertinent test results, reviewed documented beta blocker date and time   Airway Mallampati: II  TM Distance: >3 FB Neck ROM: full    Dental no notable dental hx.    Pulmonary sleep apnea ,    Pulmonary exam normal breath sounds clear to auscultation       Cardiovascular Exercise Tolerance: Good + CAD, + Past MI and + Peripheral Vascular Disease   Rhythm:regular Rate:Normal     Neuro/Psych negative neurological ROS  negative psych ROS   GI/Hepatic negative GI ROS, (+) Cirrhosis   Esophageal Varices    ,   Endo/Other  Hypothyroidism   Renal/GU ARFRenal disease  negative genitourinary   Musculoskeletal   Abdominal   Peds  Hematology  (+) Blood dyscrasia, anemia ,   Anesthesia Other Findings   Reproductive/Obstetrics negative OB ROS                             Anesthesia Physical Anesthesia Plan  ASA: 4 and emergent  Anesthesia Plan: General   Post-op Pain Management:    Induction:   PONV Risk Score and Plan: Propofol infusion  Airway Management Planned:   Additional Equipment:   Intra-op Plan:   Post-operative Plan:   Informed Consent: I have reviewed the patients History and Physical, chart, labs and discussed the procedure including the risks, benefits and alternatives for the proposed anesthesia with the patient or authorized representative who has indicated his/her understanding and acceptance.     Dental Advisory Given  Plan Discussed with: CRNA  Anesthesia Plan Comments:         Anesthesia Quick Evaluation

## 2021-09-23 NOTE — Op Note (Signed)
[facility] Patient Name: Cody Austin Procedure Date: 09/23/2021 2:25 PM MRN: [id number] Date of Birth: 1951-02-17 Attending MD: Hildred Laser , MD CSN: [id number] Age: 71 Admit Type: Inpatient Procedure:                Upper GI endoscopy Indications:              Iron deficiency anemia secondary to chronic blood                            loss Providers:                Hildred Laser, MD, Crystal Page, Rosina Lowenstein, RN Referring MD:              Medicines:                Propofol per Anesthesia Complications:            No immediate complications. Estimated Blood Loss:     Estimated blood loss: none. Procedure:                Pre-Anesthesia Assessment:                           - Prior to the procedure, a History and Physical                            was performed, and patient medications and                            allergies were reviewed. The patient's tolerance of                            previous anesthesia was also reviewed. The risks                            and benefits of the procedure and the sedation                            options and risks were discussed with the patient.                            All questions were answered, and informed consent                            was obtained. Prior Anticoagulants: The patient has                            taken no previous anticoagulant or antiplatelet                            agents. ASA Grade Assessment: III - A patient with                            severe systemic disease. After reviewing the risks  and benefits, the patient was deemed in                            satisfactory condition to undergo the procedure.                           After obtaining informed consent, the endoscope was                            passed under direct vision. Throughout the                            procedure, the patient's blood pressure, pulse, and                            oxygen  saturations were monitored continuously. The                            [id number]-H190 ([id number]) scope was introduced through the                            mouth, and advanced to the second part of duodenum.                            The upper GI endoscopy was accomplished without                            difficulty. The patient tolerated the procedure                            well. Scope In: 2:42:28 PM Scope Out: 2:59:21 PM Total Procedure Duration: 0 hours 16 minutes 53 seconds  Findings:      The hypopharynx was normal.      The proximal esophagus and mid esophagus were normal.      Grade I varices were found in the lower third of the esophagus.      Moderate portal hypertensive gastropathy was found in the entire       examined stomach.      Red blood was found in the gastric antrum and in the prepyloric region       of the stomach. To stop active bleeding, hemostatic spray was deployed.       Four sprays were applied. There was no bleeding at the end of the       procedure.      The exam of the stomach was otherwise normal.      Red blood was found in the duodenal bulb. To stop active bleeding,       hemostatic spray was deployed. Two sprays were applied. There was no       bleeding at the end of the procedure. Impression:               - Normal hypopharynx.                           - Normal proximal esophagus and mid esophagus.                           -  Grade I esophageal varices. No stigmata of bleed.                           - Portal hypertensive gastropathy with active                            bleeding/oozing from antral and prepyloric mucosa.                           - Hemostatic spray applied and hemostasis achieved.                           - Portal hypertensive duodenopathy with active                            bleeding. Bleeding controlled with Hemospray.                           - No specimens collected. Moderate Sedation:      Per Anesthesia  Care Recommendation:           - Return patient to hospital ward for ongoing care.                           - Full liquid diet today.                           - Continue present medications.                           - No aspirin, ibuprofen, naproxen, or other                            non-steroidal anti-inflammatory drugs. Procedure Code(s):        --- Professional ---                           [phone], Esophagogastroduodenoscopy, flexible,                            transoral; with control of bleeding, any method Diagnosis Code(s):        --- Professional ---                           I85.00, Esophageal varices without bleeding                           K76.6, Portal hypertension                           K31.89, Other diseases of stomach and duodenum                           K92.2, Gastrointestinal hemorrhage, unspecified                           D50.0, Iron deficiency anemia secondary to blood  loss (chronic) CPT copyright 2019 American Medical Association. All rights reserved. The codes documented in this report are preliminary and upon coder review may  be revised to meet current compliance requirements. Hildred Laser, MD Hildred Laser, MD 09/23/2021 3:19:07 PM This report has been signed electronically. Number of Addenda: 0

## 2021-09-23 NOTE — Consult Note (Signed)
Referring Provider: No ref. provider found Primary Care Physician:  Pcp, No Primary Gastroenterologist:  Dr.Rehman  Date of Admission: 09/23/21 Date of Consultation: 09/23/21  Reason for Consultation:  GI bleed  HPI:  Cody Austin is a 71 y.o. year old male  with medical history significant of nonalcoholic cirrhosis, coronary artery disease, hypothyroidism, peripheral artery disease, sleep apnea, and anemia who presented to the ED after having labs drawn with primary GI which showed hgb of 6.6. GI consulted for further evaluation.  ED Course: Hgb 6.3 on arrival, transfused 2 units PRBCs BP with slightly soft DBP, 116/59, other VSS Leukopenia with WBC 3.8, platelet count 63k AST 154, ALT 80, T bili 4.1 Creatnine 1.54 FOBT positive Protonix 53m IV  Consult:  History: Patient is well known to our clinic, seen yesterday in office by Dr. RLaural Golden History of NASH cirrhosis complicated by  ascites requiring LVAP every 10 to 14 days, hepatic encephalopathy, 2 liver lesions concerning for HCC(patient not a candidate for liver transplant because of triple-vessel coronary artery disease) as well as chemotherapy or TACE given he has child Pugh C disease. He also has hx of anemia.  [facility] He was admitted for chest pain and did not undergo endoscopic evaluation because of his condition. During OV he denied any rectal bleeding or melena, though c/o extreme weakness. Had Paracentesis on Monday with removal of 3.7L of fluid, no SBP present on fluid. Given worsening renal function and 5 abd paracentessis over the past 9 weeks, he was discontinued off of lasix yesterday. He appeared at  baseline mental status wise and is maintained on lactulose and Xifaxan. His cirrhosis has been decompensated, he is not a candidate for liver transplant as he suffered an MI in March 2023 with cath revealing triple-vessel CAD. Albumin has been low and there  were plans for outpatient albumin infusions. Has been referred to Dr. DRuthann Cancerof IR for evaluation of TIPS though this evaluation has not yet been done.   Present: Patient reports that he has seen some darker stools recently, maybe x2 weeks, though unsure if they were black. He denies any BRBPR. He denies nausea or vomiting. Reports that he has some pain to R side of his abdomen where he typically has fluid drawn off for his paracentesis. He reports weight has been increasing about 1/4-1/2 pound per day. Appetite has been decent. Denies any symptoms heartburn or acid reflux. Denies any NSAIDs. Denies SOB or dizziness. Reports that abdominal swelling went down well after recent paracentesis, though by the end of that day he felt that abdomen was much fuller again. Denies any upper extremities. Does note that lower extremities have also been fairly swollen recently. He does note that developed a twitch for about the past week. Reports that his abdomen and legs have been itching more recently as well.     EGD:10/01/20 - Normal hypopharynx. - Normal proximal esophagus. - Grade I esophageal varices. Varices were not large enough to be banded. - Z-line irregular, 42 cm from the incisors. - Portal hypertensive gastropathy. - Normal duodenal bulb and second portion of the duodenum. - No specimens collected. Colonoscopy:2016   Past Medical History:  Diagnosis Date   [place]   Arthritis    Cirrhosis, non-alcoholic (HCC)    Coronary artery disease    Fatty liver    Heart murmur    History of kidney stones  Hypothyroidism 07/17/2021   NASH Liver cirrhosis secondary to NASH (nonalcoholic steatohepatitis) 06/01/2019   NSTEMI (non-ST elevated myocardial infarction) (HCC)    Other pancytopenia (HCC) 11/19/2014   Peripheral arterial disease (HCC) 05/28/2019   Sleep apnea    uses CPAP    Past Surgical History:  Procedure Laterality Date   CATARACT EXTRACTION  W/PHACO Right 08/12/2014   Procedure: CATARACT EXTRACTION PHACO AND INTRAOCULAR LENS PLACEMENT (IOC);  Surgeon: Kerry Hunt, MD;  Location: [facility];  Service: Ophthalmology;  Laterality: Right;  CDE 9.32   CATARACT EXTRACTION W/PHACO Left 08/26/2014   Procedure: CATARACT EXTRACTION PHACO AND INTRAOCULAR LENS PLACEMENT (IOC);  Surgeon: Kerry Hunt, MD;  Location: [facility];  Service: Ophthalmology;  Laterality: Left;  CDE:  9.49   COLONOSCOPY N/A 12/13/2014   Procedure: COLONOSCOPY;  Surgeon: Najeeb U Rehman, MD;  Location: [facility];  Service: Endoscopy;  Laterality: N/A;  100   ESOPHAGEAL BANDING N/A 08/31/2019   Procedure: ESOPHAGEAL BANDING;  Surgeon: Rehman, Najeeb U, MD;  Location: [facility];  Service: Endoscopy;  Laterality: N/A;   ESOPHAGOGASTRODUODENOSCOPY N/A 12/13/2014   Procedure: ESOPHAGOGASTRODUODENOSCOPY (EGD);  Surgeon: Najeeb U Rehman, MD;  Location: [facility];  Service: Endoscopy;  Laterality: N/A;   ESOPHAGOGASTRODUODENOSCOPY (EGD) WITH PROPOFOL N/A 08/31/2019   Procedure: ESOPHAGOGASTRODUODENOSCOPY (EGD) WITH PROPOFOL;  Surgeon: Rehman, Najeeb U, MD;  Location: [facility];  Service: Endoscopy;  Laterality: N/A;  1200   ESOPHAGOGASTRODUODENOSCOPY (EGD) WITH PROPOFOL N/A 10/01/2020   Procedure: ESOPHAGOGASTRODUODENOSCOPY (EGD) WITH PROPOFOL;  Surgeon: Rehman, Najeeb U, MD;  Location: [facility];  Service: Endoscopy;  Laterality: N/A;  10:00   HERNIA REPAIR Right    Inguinal hernia   TONSILLECTOMY      Prior to Admission medications   Medication Sig Start Date End Date Taking? Authorizing Provider  atorvastatin (LIPITOR) 20 MG tablet Take 20 mg by mouth at bedtime.  08/25/17  Yes [provider]  furosemide (LASIX) 20 MG tablet Take 20 mg by mouth daily.   Yes [provider]  isosorbide mononitrate (IMDUR) 30 MG 24 hr tablet Take 30 mg by mouth daily. 09/22/21  Yes [provider]  lactulose (CHRONULAC) 10 GM/15ML solution Take 30 mLs (20 g total)  by mouth 3 (three) times daily. Goal is 2 bowel movements daily. 07/20/21  Yes Chatterjee, Srobona Tublu, MD  levothyroxine (SYNTHROID) 50 MCG tablet Take 50 mcg by mouth daily. 09/01/21  Yes [provider]  metoprolol tartrate (LOPRESSOR) 25 MG tablet Take 0.5 tablets (12.5 mg total) by mouth 2 (two) times daily. 08/10/21  Yes Chandrasekhar, [name], MD  Omega-3 Fatty Acids (OMEGA 3 500 PO) Take 500 mg by mouth at bedtime.   Yes [provider]  pantoprazole (PROTONIX) 40 MG tablet Take 1 tablet (40 mg total) by mouth daily before breakfast. 07/28/21  Yes Rehman, Najeeb U, MD  rifaximin (XIFAXAN) 550 MG TABS tablet Take 1 tablet (550 mg total) by mouth 2 (two) times daily. Patient taking differently: Take 550 mg by mouth 2 (two) times daily. Continuously 08/12/20  Yes Rehman, Najeeb U, MD  zinc gluconate 50 MG tablet Take 50 mg by mouth daily.   Yes [provider]  levothyroxine (SYNTHROID) 75 MCG tablet Take 0.5 tablets (37.5 mcg total) by mouth daily before breakfast. 09/22/21   Rehman, Najeeb U, MD    Current Facility-Administered Medications  Medication Dose Route Frequency Provider Last Rate Last Admin   acetaminophen (TYLENOL) tablet 650 mg  650 mg Oral   Q6H PRN Zierle-Ghosh, Asia B, DO       Or   acetaminophen (TYLENOL) suppository 650 mg  650 mg Rectal Q6H PRN Zierle-Ghosh, Asia B, DO       atorvastatin (LIPITOR) tablet 20 mg  20 mg Oral QHS Zierle-Ghosh, Asia B, DO       isosorbide mononitrate (IMDUR) 24 hr tablet 30 mg  30 mg Oral Daily Zierle-Ghosh, Asia B, DO   30 mg at 09/23/21 0903   lactulose (CHRONULAC) 10 GM/15ML solution 20 g  20 g Oral TID Zierle-Ghosh, Asia B, DO   20 g at 09/23/21 0903   levothyroxine (SYNTHROID) tablet 37.5 mcg  37.5 mcg Oral Q0600 Zierle-Ghosh, Asia B, DO   37.5 mcg at 09/23/21 0542   metoprolol tartrate (LOPRESSOR) tablet 12.5 mg  12.5 mg Oral BID Zierle-Ghosh, Asia B, DO   12.5 mg at 09/23/21 0903   morphine (PF) 2 MG/ML injection 2  mg  2 mg Intravenous Q2H PRN Zierle-Ghosh, Asia B, DO       ondansetron (ZOFRAN) tablet 4 mg  4 mg Oral Q6H PRN Zierle-Ghosh, Asia B, DO       Or   ondansetron (ZOFRAN) injection 4 mg  4 mg Intravenous Q6H PRN Zierle-Ghosh, Asia B, DO       oxyCODONE (Oxy IR/ROXICODONE) immediate release tablet 5 mg  5 mg Oral Q4H PRN Zierle-Ghosh, Asia B, DO       pantoprazole (PROTONIX) injection 40 mg  40 mg Intravenous Q12H Zierle-Ghosh, Asia B, DO   40 mg at 09/23/21 0904   rifaximin (XIFAXAN) tablet 550 mg  550 mg Oral BID Zierle-Ghosh, Asia B, DO   550 mg at 09/23/21 0902    Allergies as of 09/22/2021   (No Known Allergies)    Family History  Problem Relation Age of Onset   Cancer Father    Aneurysm Father     Social History   Socioeconomic History   Marital status: Married    Spouse name: Not on file   Number of children: Not on file   Years of education: Not on file   Highest education level: Not on file  Occupational History   Not on file  Tobacco Use   Smoking status: Never    Passive exposure: Never   Smokeless tobacco: Never  Vaping Use   Vaping Use: Never used  Substance and Sexual Activity   Alcohol use: Not Currently   Drug use: No   Sexual activity: Yes  Other Topics Concern   Not on file  Social History Narrative   Not on file   Social Determinants of Health   Financial Resource Strain: Not on file  Food Insecurity: Not on file  Transportation Needs: Not on file  Physical Activity: Not on file  Stress: Not on file  Social Connections: Not on file  Intimate Partner Violence: Not on file   Review of Systems: Gen: Denies fever, chills +decreased appetite +weight gain CV: Denies chest pain, heart palpitations, syncope, edema  Resp: Denies shortness of breath with rest, cough, wheezing GI:  denies  hematochezia, nausea, vomiting, diarrhea, constipation, dysphagia, odyonophagia, early satiety, hematemesis or weight loss. +darker stools/possibly melena GU : Denies  urinary burning, urinary frequency, urinary incontinence.  MS: Denies joint pain, cramping +LE edema Derm: Denies rash, +itching Psych: Denies depression, anxiety,confusion, or memory loss Heme: Denies bruising, bleeding, and enlarged lymph nodes.  Physical Exam: Vital signs in last 24 hours: Temp:  [97.4 F (36.3 C)-98 F (36.7   C)] 98 F (36.7 C) (06/07 0523) Pulse Rate:  [52-90] 62 (06/07 0523) Resp:  [14-21] 18 (06/07 0523) BP: (88-123)/(50-62) 120/60 (06/07 0523) SpO2:  [99 %-100 %] 100 % (06/07 0523) FiO2 (%):  [21 %] 21 % (06/06 2331) Weight:  [96.6 kg-99.5 kg] 99.5 kg (06/06 2343) Last BM Date : 09/23/21 General:   Alert,  Well-developed, well-nourished, pleasant and cooperative in NAD Head:  Normocephalic and atraumatic. Eyes:  sclera are icteric Ears:  Normal auditory acuity. Nose:  No deformity, discharge,  or lesions. Mouth:  No deformity or lesions, dentition normal. Lungs:  Clear throughout to auscultation.   No wheezes, crackles, or rhonchi. No acute distress. Heart:  Regular rate and rhythm; no murmurs, clicks, rubs,  or gallops. Abdomen:  Soft, moderate ascites present with some pitting edema to lower abdomen though abdomen is not taut Rectal:  Deferred until time of colonoscopy.   Msk:  Symmetrical without gross deformities. Normal posture. Pulses:  Normal pulses noted. Extremities:  2+pitting edema to LEs  Neurologic:  Alert and  oriented x4;  grossly normal neurologically. No asterixis  Skin: jaundiced  Psych:  Alert and cooperative. Normal mood and affect.  Intake/Output from previous day: 06/06 0701 - 06/07 0700 In: 901 [Blood:901] Out: -  Intake/Output this shift: No intake/output data recorded.  Lab Results: Recent Labs    09/22/21 1629 09/22/21 2100 09/23/21 0812  WBC 4.4 3.8* 3.2*  HGB 6.6* 6.3* 8.4*  HCT 21.2* 19.8* 25.8*  PLT 71* 63* 51*   BMET Recent Labs    09/22/21 2100 09/23/21 0812  NA 128* 133*  K 4.0 4.1  CL 105 107  CO2  23 24  GLUCOSE 100* 79  BUN 45* 46*  CREATININE 1.54* 1.40*  CALCIUM 8.1* 8.3*   LFT Recent Labs    09/22/21 2100 09/23/21 0812  PROT 4.4* 4.2*  ALBUMIN <1.5* <1.5*  AST 154* 147*  ALT 80* 75*  ALKPHOS 110 107  BILITOT 4.1* 5.5*   PT/INR Recent Labs    09/22/21 2100  LABPROT 27.7*  INR 2.6*    US Paracentesis  Result Date: 09/21/2021 INDICATION: Nonalcoholic cirrhosis EXAM: ULTRASOUND GUIDED DIAGNOSTIC AND THERAPEUTIC PARACENTESIS MEDICATIONS: None. COMPLICATIONS: None immediate. PROCEDURE: Informed written consent was obtained from the patient after a discussion of the risks, benefits and alternatives to treatment. A timeout was performed prior to the initiation of the procedure. Initial ultrasound scanning demonstrates a large amount of ascites within the right lower abdominal quadrant. The right lower abdomen was prepped and draped in the usual sterile fashion. 1% lidocaine was used for local anesthesia. Following this, a 5 French Yueh catheter was introduced. An ultrasound image was saved for documentation purposes. The paracentesis was performed. The catheter was removed and a dressing was applied. The patient tolerated the procedure well without immediate post procedural complication. Patient received post-procedure intravenous albumin; see nursing notes for details. FINDINGS: A total of approximately 3.7 L of amber colored ascitic fluid was removed. Samples were sent to the laboratory as requested by the clinical team. IMPRESSION: Successful ultrasound-guided paracentesis yielding 3.7 liters of peritoneal fluid. Electronically Signed   By: Mark  Boles M.D.   On: 09/21/2021 11:20    Impression: Cody Austin is a 71 y.o. year old male  with medical history significant of nonalcoholic cirrhosis, coronary artery disease, hypothyroidism, peripheral artery disease, sleep apnea, and anemia who presented to the ED after having labs drawn with primary GI which showed hgb of 6.6. GI  consulted   for further evaluation.  NASH Cirrhosis: decompensated with presence of grade 1 esophageal varices on last EGD in June 2022, requiring multiple paracentesis recently with most recent on Monday with 3.7L yield, no evidence of SBP. Not a candidate for transplant due to triple vessel CAD. AST 147, ALT 75, ALK PHos 107, T bili 5.5, albumin <1.5, platelet count 51k. Lasix was stopped at GI OV yesterday as renal function has worsened and diuretic therapy seems to be having little effect on ascites, patient scheduled to have outpatient albumin infusion on 6/14. continues with moderate ascites with some pitting edema to lower abdomen, though he feels it is less than prior to paracentesis. Legs with 2+pitting edema. He has had no recent episodes of confusion. No obvious HE on current outpatient regimen of lactulose and Xifaxan, which should be continued here. Recommend inpatient albumin infusion to help with ascites.   Anemia: hgb 6.6 on admission, 6.3 at lowest last night, up to 8.4 this  morning s/p PRBC transfusion x2. Recent fatigue and darker stools/possible melena, denies BRBPR or hematemesis. Hx of esophageal varices and portal hypertensive gastropathy on last EGD in July 2022. Reports darker stools x2 weeks though unsure if they were black. Notably BUN elevated to 46 this morning, up from 31 one month ago, creatnine 1.4, down from 1.54 on admission. Discussed proceeding with EGD for further evaluation of suspected UGI source of blood loss, consider that esophageal variceal bleed is less likely source at this time given presentation, however, cannot rule this out, query possibly bleeding from known gastropathy in setting of chronic thrombocytopenia with plt count of 51k today. Indications, risks and benefits of procedure discussed in detail with patient. Patient verbalized understanding and is in agreement to proceed with EGD at this time.    Plan: MELD labs and INR daily Trend H&H, transfuse hgb  <7 EGD this afternoon PPI BID Will hold SBP prophylaxis given paracentesis on 6/5 was without SBP  Albumin infusion 50g Continue Xifaxan 550mg BID continue lactulose 20g TID   LOS: 0 days    09/23/2021, 10:07 AM   Chelsea L. Carlan, MSN, APRN, AGNP-C Adult-Gerontology Nurse Practitioner [facility]  

## 2021-09-23 NOTE — Transfer of Care (Signed)
Immediate Anesthesia Transfer of Care Note  Patient: Cody Austin  Procedure(s) Performed: ESOPHAGOGASTRODUODENOSCOPY (EGD) WITH PROPOFOL  Patient Location: PACU  Anesthesia Type:General  Level of Consciousness: drowsy  Airway & Oxygen Therapy: Patient Spontanous Breathing  Post-op Assessment: Report given to RN and Post -op Vital signs reviewed and stable  Post vital signs: Reviewed and stable  Last Vitals:  Vitals Value Taken Time  BP 93/57   Temp    Pulse 63 09/23/21 1503  Resp 12   SpO2 97 % 09/23/21 1503  Vitals shown include unvalidated device data.  Last Pain:  Vitals:   09/23/21 1438  TempSrc:   PainSc: 0-No pain         Complications: No notable events documented.

## 2021-09-23 NOTE — Assessment & Plan Note (Signed)
Continue synthroid.

## 2021-09-23 NOTE — Progress Notes (Signed)
Brief EGD note.  Grade 1 esophageal varices. Diffuse portal gastropathy with oozing from multiple sites in the antrum. No evidence of peptic ulcer disease or fundal varices. Portal duodenopathy with active bleeding/oozing from bulbar mucosa.  Hemospray used both in the duodenum and antrum and hemostasis achieved.  Patient tolerated the procedure well.

## 2021-09-23 NOTE — H&P (Signed)
History and Physical    Patient: Cody Austin DQQ:[id number] DOB: 1951-03-25 DOA: 09/22/2021 DOS: the patient was seen and examined on 09/23/2021 PCP: Pcp, No  Patient coming from: Home  Chief Complaint:  Chief Complaint  Patient presents with   Blood Transfusion   HPI: Cody Austin is a 71 y.o. male with medical history significant of nonalcoholic cirrhosis, coronary artery disease, hypothyroidism, peripheral artery disease, deep apnea, presents to the ED with a chief complaint of abnormal labs.  Patient reports that he had labs drawn showed a low hemoglobin.  He was sent to the ER to have the lab confirmed and further treated as needed.  Patient was sent by the GI clinic.  He follows regularly with Dr. Melony Overly. Patient's hemoglobin has been steadily trending down since at least April at which time it was 9.1, today it is 6.3.  Patient reports that he has had dark stools for couple of days.  His rectal exam revealed green stool that was guaiac positive.  Patient denies any hematuria, epistaxis, bleeding from the gums.  He reports he has been fatigued but that is been going on for 3 months, gradually worse over time.  Patient denies paresthesias, chest pains, lightheadedness.   He does go for regular paracenteses, the last one being the day prior to admission.  Patient does report that at 1 April he was admitted to [facility] after being sent there for transplant evaluation.  After that admission he came home for about a day and then had chest pains and had a cardiac cath that found 3 major blockages.  He reports that he has been told the cardiac issues cannot be attended to because of the hepatic issues, and he is not eligible for transplant due to cardiac issues.  At this time patient would like to pursue aggressive treatment for anything that can be treated, but if his heart stops he prefers to be DNR.  Patient does not smoke, does not drink alcohol, does not use illicit drugs.  He is vaccinated  for COVID. Review of Systems: As mentioned in the history of present illness. All other systems reviewed and are negative. Past Medical History:  Diagnosis Date   AKI (acute kidney injury) ([place]) 07/17/2021   Arthritis    Cirrhosis, non-alcoholic ([place])    Coronary artery disease    Fatty liver    Heart murmur    History of kidney stones    Hypothyroidism 07/17/2021   NASH Liver cirrhosis secondary to NASH (nonalcoholic steatohepatitis) 06/01/2019   NSTEMI (non-ST elevated myocardial infarction) ([place])    Other pancytopenia ([place]) 11/19/2014   Peripheral arterial disease ([place]) 05/28/2019   Sleep apnea    uses CPAP   Past Surgical History:  Procedure Laterality Date   CATARACT EXTRACTION W/PHACO Right 08/12/2014   Procedure: CATARACT EXTRACTION PHACO AND INTRAOCULAR LENS PLACEMENT ([place]);  Surgeon: [name], MD;  Location: [facility] OR[facility];  Service: Ophthalmology;  Laterality: Right;  CDE 9.32   CATARACT EXTRACTION W/PHACO Left 08/26/2014   Procedure: CATARACT EXTRACTION PHACO AND INTRAOCULAR LENS PLACEMENT ([facility]);  Surgeon: [name], MD;  Location: [facility] ORS;  Service: Ophthalmology;  Laterality: Left;  CDE:  9.49   COLONOSCOPY N/A 12/13/2014   Procedure: COLONOSCOPY;  Surgeon: Rogene Houston, MD;  Location: [facility] END[facility] SUITE;  Service: Endoscopy;  Laterality: N/A;  100   ESOPHAGEAL BANDING N/A 08/31/2019   Procedure: ESOPHAGEAL BANDING;  Surgeon: Rogene Houston, MD;  Location: [facility];  Service: Endoscopy;  Laterality: N/A;   ESOPHAGOGASTRODUODENOSCOPY N/A 12/13/2014   Procedure: ESOPHAGOGASTRODUODENOSCOPY (EGD);  Surgeon: Rogene Houston, MD;  Location: [facility];  Service: Endoscopy;  Laterality: N/A;   ESOPHAGOGASTRODUODENOSCOPY (EGD) WITH PROPOFOL N/A 08/31/2019   Procedure: ESOPHAGOGASTRODUODENOSCOPY (EGD) WITH PROPOFOL;  Surgeon: Rogene Houston, MD;  Location: [facility];  Service: Endoscopy;  Laterality: N/A;  1200   ESOPHAGOGASTRODUODENOSCOPY (EGD) WITH PROPOFOL N/A 10/01/2020    Procedure: ESOPHAGOGASTRODUODENOSCOPY (EGD) WITH PROPOFOL;  Surgeon: Rogene Houston, MD;  Location: [facility];  Service: Endoscopy;  Laterality: N/A;  10:00   HERNIA REPAIR Right    Inguinal hernia   TONSILLECTOMY     Social History:  reports that he has never smoked. He has never been exposed to tobacco smoke. He has never used smokeless tobacco. He reports that he does not currently use alcohol. He reports that he does not use drugs.  No Known Allergies  Family History  Problem Relation Age of Onset   Cancer Father    Aneurysm Father     Prior to Admission medications   Medication Sig Start Date End Date Taking? Authorizing Provider  atorvastatin (LIPITOR) 20 MG tablet Take 20 mg by mouth at bedtime.  08/25/17  Yes [provider]  furosemide (LASIX) 20 MG tablet Take 20 mg by mouth daily.   Yes [provider]  isosorbide mononitrate (IMDUR) 30 MG 24 hr tablet Take 30 mg by mouth daily. 09/22/21  Yes [provider]  lactulose (CHRONULAC) 10 GM/15ML solution Take 30 mLs (20 g total) by mouth 3 (three) times daily. Goal is 2 bowel movements daily. 07/20/21  Yes [name], MD  levothyroxine (SYNTHROID) 50 MCG tablet Take 50 mcg by mouth daily. 09/01/21  Yes [provider]  metoprolol tartrate (LOPRESSOR) 25 MG tablet Take 0.5 tablets (12.5 mg total) by mouth 2 (two) times daily. 08/10/21  Yes Chandrasekhar, [name], MD  Omega-3 Fatty Acids (OMEGA 3 500 PO) Take 500 mg by mouth at bedtime.   Yes [provider]  pantoprazole (PROTONIX) 40 MG tablet Take 1 tablet (40 mg total) by mouth daily before breakfast. 07/28/21  Yes Rehman, Mechele Dawley, MD  rifaximin (XIFAXAN) 550 MG TABS tablet Take 1 tablet (550 mg total) by mouth 2 (two) times daily. Patient taking differently: Take 550 mg by mouth 2 (two) times daily. Continuously 08/12/20  Yes Rehman, Mechele Dawley, MD  zinc gluconate 50 MG tablet Take 50 mg by mouth daily.   Yes [provider]  levothyroxine (SYNTHROID) 75 MCG tablet Take 0.5 tablets (37.5 mcg total) by mouth daily before breakfast. 09/22/21   Rogene Houston, MD    Physical Exam: Vitals:   09/22/21 2331 09/22/21 2343 09/23/21 0157 09/23/21 0215  BP:   (!) 110/58 110/60  Pulse:   64 66  Resp:   18 18  Temp:   97.9 F (36.6 C) 97.9 F (36.6 C)  TempSrc:   Oral Oral  SpO2: 99%  99% 99%  Weight:  99.5 kg    Height:       1.  General: Patient lying supine in bed,  no acute distress   2. Psychiatric: Alert and oriented x 3, mood and behavior normal for situation, pleasant and cooperative with exam   3. Neurologic: Speech and language are normal, face is symmetric, moves all 4 extremities voluntarily, at baseline without acute deficits on limited exam   4. HEENMT:  Head is atraumatic, normocephalic, pupils reactive to  light, neck is supple, trachea is midline, mucous membranes are moist   5. Respiratory : Lungs are clear to auscultation bilaterally without wheezing, rhonchi, rales, no cyanosis, no increase in work of breathing or accessory muscle use   6. Cardiovascular : Heart rate normal, rhythm is regular, murmur present, no rubs or gallops, no peripheral 2+ pitting edema, peripheral pulses palpated   7. Gastrointestinal:  Abdomen is soft, mildly distended with fluid wave, nontender to palpation bowel sounds active, no masses or organomegaly palpated   8. Skin:  Venous stasis changes of the lower extremities   9.Musculoskeletal:  No acute deformities or trauma, no asymmetry in tone, peripheral edema present, peripheral pulses palpated, no tenderness to palpation in the extremities  Data Reviewed: In the ED Temp 97.4-97, heart rate 56-90, respiratory rate 14-15, blood pressure 110/54, satting at 100% Patient has leukopenia 3.8, anemia with a hemoglobin of 6.8, thrombocytopenia platelets 63 Chemistry reveals AST elevated at 154, ALT elevated 80-baseline Creatinine is 1.54, at  baseline FOBT is positive EKG shows a heart rate 61, sinus rhythm, QTc 438 2 units transfusion ordered in the ED Protonix 40 mg IV given in the ED Admission requested for symptomatic anemia with fatigue, possible GI bleed  Assessment and Plan: * Symptomatic anemia -hgb 6.3 -currently transfusing two units -FOBT positive - Consult Gastro -Protonix BID - check cbc q 6H -end stage liver disease also contributing -anemia panel for completeness  Hypothyroidism -Continue synthroid  Coronary artery disease -Continue imdur, metoprolol -No chest pain -EKG without acute ischemic changes   NASH Liver cirrhosis secondary to NASH (nonalcoholic steatohepatitis) -Continue rifaximin -Continue lactulose -Check ammonia in the AM -Associated pancytopenia -transaminases at baseline  HLD (hyperlipidemia) -Continue lipitor      Advance Care Planning:   Code Status: DNR   Consults: Gastroenterology  Family Communication: No family at bedside  Severity of Illness: The appropriate patient status for this patient is OBSERVATION. Observation status is judged to be reasonable and necessary in order to provide the required intensity of service to ensure the patient's safety. The patient's presenting symptoms, physical exam findings, and initial radiographic and laboratory data in the context of their medical condition is felt to place them at decreased risk for further clinical deterioration. Furthermore, it is anticipated that the patient will be medically stable for discharge from the hospital within 2 midnights of admission.   Author: Rolla Plate, DO 09/23/2021 2:23 AM  For on call review [email or web address].

## 2021-09-23 NOTE — TOC Progression Note (Signed)
  Transition of Care [facility]) Screening Note   Patient Details  Name: Cody Austin Date of Birth: Apr 20, 1950   Transition of Care [facility]) CM/SW Contact:    Shade Flood, LCSW Phone Number: 09/23/2021, 10:55 AM    Transition of Care Department [facility]) has reviewed patient and no TOC needs have been identified at this time. We will continue to monitor patient advancement through interdisciplinary progression rounds. If new patient transition needs arise, please place a TOC consult.

## 2021-09-24 DIAGNOSIS — D649 Anemia, unspecified: Secondary | ICD-10-CM | POA: Diagnosis not present

## 2021-09-24 DIAGNOSIS — E039 Hypothyroidism, unspecified: Secondary | ICD-10-CM | POA: Diagnosis not present

## 2021-09-24 DIAGNOSIS — I25118 Atherosclerotic heart disease of native coronary artery with other forms of angina pectoris: Secondary | ICD-10-CM | POA: Diagnosis not present

## 2021-09-24 DIAGNOSIS — K7460 Unspecified cirrhosis of liver: Secondary | ICD-10-CM | POA: Diagnosis not present

## 2021-09-24 DIAGNOSIS — K7581 Nonalcoholic steatohepatitis (NASH): Secondary | ICD-10-CM | POA: Diagnosis not present

## 2021-09-24 LAB — TYPE AND SCREEN
ABO/RH(D): A POS
Antibody Screen: NEGATIVE
Unit division: 0
Unit division: 0

## 2021-09-24 LAB — CBC
HCT: 24.6 % — ABNORMAL LOW (ref 39.0–52.0)
Hemoglobin: 7.9 g/dL — ABNORMAL LOW (ref 13.0–17.0)
MCH: 33.1 pg (ref 26.0–34.0)
MCHC: 32.1 g/dL (ref 30.0–36.0)
MCV: 102.9 fL — ABNORMAL HIGH (ref 80.0–100.0)
Platelets: 50 10*3/uL — ABNORMAL LOW (ref 150–400)
RBC: 2.39 MIL/uL — ABNORMAL LOW (ref 4.22–5.81)
RDW: 21.2 % — ABNORMAL HIGH (ref 11.5–15.5)
WBC: 3.2 10*3/uL — ABNORMAL LOW (ref 4.0–10.5)
nRBC: 0 % (ref 0.0–0.2)

## 2021-09-24 LAB — PROTIME-INR
INR: 2.6 — ABNORMAL HIGH (ref 0.8–1.2)
Prothrombin Time: 27.8 seconds — ABNORMAL HIGH (ref 11.4–15.2)

## 2021-09-24 LAB — BPAM RBC
Blood Product Expiration Date: 202307062359
Blood Product Expiration Date: 202307062359
ISSUE DATE / TIME: 202306062219
ISSUE DATE / TIME: 202306070158
Unit Type and Rh: 6200
Unit Type and Rh: 6200

## 2021-09-24 MED ORDER — ALBUMIN HUMAN 25 % IV SOLN
25.0000 g | Freq: Once | INTRAVENOUS | Status: AC
  Administered 2021-09-24: 25 g via INTRAVENOUS
  Filled 2021-09-24: qty 100

## 2021-09-24 MED ORDER — PANTOPRAZOLE SODIUM 40 MG PO TBEC
40.0000 mg | DELAYED_RELEASE_TABLET | Freq: Two times a day (BID) | ORAL | 5 refills | Status: AC
Start: 2021-09-24 — End: ?

## 2021-09-24 NOTE — Anesthesia Postprocedure Evaluation (Signed)
Anesthesia Post Note  Patient: Cody Austin  Procedure(s) Performed: ESOPHAGOGASTRODUODENOSCOPY (EGD) WITH PROPOFOL  Patient location during evaluation: Phase II Anesthesia Type: General Level of consciousness: awake Pain management: pain level controlled Vital Signs Assessment: post-procedure vital signs reviewed and stable Respiratory status: spontaneous breathing and respiratory function stable Cardiovascular status: blood pressure returned to baseline and stable Postop Assessment: no headache and no apparent nausea or vomiting Anesthetic complications: no Comments: Late entry   No notable events documented.   Last Vitals:  Vitals:   09/24/21 0417 09/24/21 0910  BP: (!) 118/58   Pulse: (!) 58 62  Resp: 19   Temp: 36.5 C   SpO2: 98%     Last Pain:  Vitals:   09/23/21 2051  TempSrc: Oral  PainSc:                  Louann Sjogren

## 2021-09-24 NOTE — Progress Notes (Signed)
Gastroenterology Progress Note   Referring Provider: No ref. provider found Primary Care Physician:  Pcp, No Primary Gastroenterologist:  Dr.  Patient ID: Cody Austin; [id number]; Dec 31, 1950   Subjective:    Tolerated albumin last night. Agreeable to receiving additional dose today. Denies abdominal pain. Stool this morning X 2. Lighter than before but not quite normal brown. Appetite good. No abdominal pain.   Objective:   Vital signs in last 24 hours: Temp:  [97.5 F (36.4 C)-97.8 F (36.6 C)] 97.7 F (36.5 C) (06/08 0417) Pulse Rate:  [50-63] 62 (06/08 0910) Resp:  [12-20] 19 (06/08 0417) BP: (93-118)/(54-63) 118/58 (06/08 0417) SpO2:  [97 %-100 %] 98 % (06/08 0417) Last BM Date : 09/23/21 General:   Alert,  Well-developed, well-nourished, pleasant and cooperative in NAD Head:  Normocephalic and atraumatic. Eyes:  Scleral icterus.  Chest: CTA bilaterally without rales, rhonchi, crackles.    Heart:  Regular rate and rhythm. Abdomen:  Soft, distended due to ascites, but not tense.Nontender. Normal bowel sounds, without guarding, and without rebound.   Extremities:  Without clubbing, deformity. 2+ pitting edema to knees, 1+ to thighs. Neurologic:  Alert and  oriented x4;  grossly normal neurologically. Skin:  Intact without significant lesions or rashes. +jaundice. Psych:  Alert and cooperative. Normal mood and affect.  Intake/Output from previous day: 06/07 0701 - 06/08 0700 In: 431 [P.O.:600; I.V.:300; IV Piggyback:39] Out: 0  Intake/Output this shift: No intake/output data recorded.  Lab Results: CBC Recent Labs    09/23/21 1050 09/23/21 1759 09/24/21 0521  WBC 3.5* 3.9* 3.2*  HGB 8.1* 8.6* 7.9*  HCT 25.1* 26.7* 24.6*  MCV 102.9* 104.3* 102.9*  PLT 54* 53* 50*   BMET Recent Labs    09/22/21 2100 09/23/21 0812  NA 128* 133*  K 4.0 4.1  CL 105 107  CO2 23 24  GLUCOSE 100* 79  BUN 45* 46*  CREATININE 1.54* 1.40*  CALCIUM 8.1* 8.3*    LFTs Recent Labs    09/22/21 2100 09/23/21 0812  BILITOT 4.1* 5.5*  ALKPHOS 110 107  AST 154* 147*  ALT 80* 75*  PROT 4.4* 4.2*  ALBUMIN <1.5* <1.5*   No results for input(s): "LIPASE" in the last 72 hours. PT/INR Recent Labs    09/22/21 2100 09/24/21 0521  LABPROT 27.7* 27.8*  INR 2.6* 2.6*    Imaging Studies: US Paracentesis  Result Date: 09/21/2021 INDICATION: Nonalcoholic cirrhosis EXAM: ULTRASOUND GUIDED DIAGNOSTIC AND THERAPEUTIC PARACENTESIS MEDICATIONS: None. COMPLICATIONS: None immediate. PROCEDURE: Informed written consent was obtained from the patient after a discussion of the risks, benefits and alternatives to treatment. A timeout was performed prior to the initiation of the procedure. Initial ultrasound scanning demonstrates a large amount of ascites within the right lower abdominal quadrant. The right lower abdomen was prepped and draped in the usual sterile fashion. 1% lidocaine was used for local anesthesia. Following this, a 5 [place] Yueh catheter was introduced. An ultrasound image was saved for documentation purposes. The paracentesis was performed. The catheter was removed and a dressing was applied. The patient tolerated the procedure well without immediate post procedural complication. Patient received post-procedure intravenous albumin; see nursing notes for details. FINDINGS: A total of approximately 3.7 L of amber colored ascitic fluid was removed. Samples were sent to the laboratory as requested by the clinical team. IMPRESSION: Successful ultrasound-guided paracentesis yielding 3.7 liters of peritoneal fluid. Electronically Signed   By: Lavonia Dana M.D.   On: 09/21/2021 11:20   US Paracentesis  Result Date: 09/09/2021 INDICATION: Patient with a history of cirrhosis and possible HCC with recurrent ascites. Interventional Radiology asked to perform a diagnostic and therapeutic paracentesis. EXAM: ULTRASOUND GUIDED PARACENTESIS MEDICATIONS: 1% lidocaine 10 ml  COMPLICATIONS: None immediate. PROCEDURE: Informed written consent was obtained from the patient after a discussion of the risks, benefits and alternatives to treatment. A timeout was performed prior to the initiation of the procedure. Initial ultrasound scanning demonstrates a large amount of ascites within the right lower abdominal quadrant. The right lower abdomen was prepped and draped in the usual sterile fashion. 1% lidocaine was used for local anesthesia. Following this, a 19 gauge, 7-cm, Yueh catheter was introduced. An ultrasound image was saved for documentation purposes. The paracentesis was performed. The catheter was removed and a dressing was applied. The patient tolerated the procedure well without immediate post procedural complication. FINDINGS: A total of approximately 1.6 L of dark yellow/amber-colored fluid was removed. Samples were sent to the laboratory as requested by the clinical team. IMPRESSION: Successful ultrasound-guided paracentesis yielding 1.6 liters of peritoneal fluid. Read by: Soyla Dryer, NP PLAN: The patient has required >/=2 paracenteses in a 30 day period and a formal evaluation by the [facility] has been arranged. Electronically Signed   By: Lavonia Dana M.D.   On: 09/09/2021 11:24   US Paracentesis  Result Date: 08/25/2021 INDICATION: Nonalcoholic cirrhosis EXAM: ULTRASOUND GUIDED DIAGNOSTIC AND THERAPEUTIC PARACENTESIS MEDICATIONS: None. COMPLICATIONS: None immediate. PROCEDURE: Informed written consent was obtained from the patient after a discussion of the risks, benefits and alternatives to treatment. A timeout was performed prior to the initiation of the procedure. Initial ultrasound scanning demonstrates a large amount of ascites within the right lower abdominal quadrant. The right lower abdomen was prepped and draped in the usual sterile fashion. 1% lidocaine was used for local anesthesia. Following this, a 19  gauge, 7-cm, Yueh catheter was introduced. An ultrasound image was saved for documentation purposes. The paracentesis was performed. The catheter was removed and a dressing was applied. The patient tolerated the procedure well without immediate post procedural complication. FINDINGS: A total of approximately 1.7 L of amber colored ascitic fluid was removed. Samples were sent to the laboratory as requested by the clinical team. IMPRESSION: Successful ultrasound-guided paracentesis yielding 1.7 liters of peritoneal fluid. Electronically Signed   By: Lavonia Dana M.D.   On: 08/25/2021 11:21  [2 weeks]  Assessment:   Cody Austin is a 71 y.o. year old male  with medical history significant of nonalcoholic cirrhosis, coronary artery disease, hypothyroidism, peripheral artery disease, sleep apnea, and anemia who presented to the ED after having labs drawn with primary GI which showed hgb of 6.6. GI consulted for further evaluation.  Anemia: Hgb 6.6 on admission, nadir of 6.3. After two units of prbcs, Hgb 8.4. Hgb fluctuating, 8.4-->8.1-->8.6-->7.9. EGD yesterday with grade 1 varices in lower esophagus, moderate portal hypertensive gastropathy entire stomach, red blood found in gastric antrum in prepyloric region s/p hemostatic spray (4 sprays). Red blood noted in duodenal bulb, s/p two hemostatic sprays. Stools lighter this morning.   NASH Cirrhosis: decompensated disease, 2 liver lesions concerning for [place], portal vein thrombosis, ascites requiring multiple paracentesis with most recent tap on Monday with no evidence of SBP, and prior hepatic encephalopathy. Not a transplant candidate due to triple vessel CAD. MELD Na 29. Has appt with IR regarding evaluation for portal vein thrombosis to attempt to reestablish portal circulation on 10/01/21, chance that hepatic function might improve  some and ascites be more manageable.  Plan:   Avoid NSAIDS/ASA. CBC on Monday, Dr. Laural Golden to arrange. Will transfuse  additional prbcs as outpatient if needed.  PPI BID. Continue Xifaxan 576m BID. IV albumin 25 grams today prior to discharge.  Continue lactulose 20g TID, titrate to 3-4 soft stools daily. Close outpatient follow up in 3-4 weeks.    LOS: 1 day   LLaureen Ochs LBernarda CaffeyRWhite Fence Surgical Suites LLCGastroenterology Associates [phone]/8/20239:48 AM

## 2021-09-24 NOTE — Plan of Care (Signed)
  Problem: Education: Goal: Knowledge of General Education information will improve Description: Including pain rating scale, medication(s)/side effects and non-pharmacologic comfort measures 09/24/2021 0957 by Santa Lighter, RN Outcome: Adequate for Discharge 09/24/2021 0956 by Santa Lighter, RN Outcome: Progressing   Problem: Health Behavior/Discharge Planning: Goal: Ability to manage health-related needs will improve 09/24/2021 0957 by Santa Lighter, RN Outcome: Adequate for Discharge 09/24/2021 0956 by Santa Lighter, RN Outcome: Progressing   Problem: Clinical Measurements: Goal: Ability to maintain clinical measurements within normal limits will improve 09/24/2021 0957 by Santa Lighter, RN Outcome: Adequate for Discharge 09/24/2021 0956 by Santa Lighter, RN Outcome: Progressing Goal: Will remain free from infection Outcome: Adequate for Discharge Goal: Diagnostic test results will improve Outcome: Adequate for Discharge Goal: Respiratory complications will improve Outcome: Adequate for Discharge Goal: Cardiovascular complication will be avoided Outcome: Adequate for Discharge   Problem: Activity: Goal: Risk for activity intolerance will decrease Outcome: Adequate for Discharge   Problem: Nutrition: Goal: Adequate nutrition will be maintained Outcome: Adequate for Discharge   Problem: Coping: Goal: Level of anxiety will decrease Outcome: Adequate for Discharge   Problem: Elimination: Goal: Will not experience complications related to bowel motility Outcome: Adequate for Discharge Goal: Will not experience complications related to urinary retention Outcome: Adequate for Discharge   Problem: Pain Managment: Goal: General experience of comfort will improve Outcome: Adequate for Discharge   Problem: Safety: Goal: Ability to remain free from injury will improve Outcome: Adequate for Discharge   Problem: Skin Integrity: Goal: Risk for impaired skin integrity will  decrease Outcome: Adequate for Discharge

## 2021-09-24 NOTE — Discharge Summary (Signed)
Physician Discharge Summary  Cody Austin LAG:[id number] DOB: October 04, 1950 DOA: 09/22/2021  PCP: Pcp, No  Admit date: 09/22/2021 Discharge date: 09/24/2021  Admitted From: Home Disposition: Home  Recommendations for Outpatient Follow-up:  Follow up with PCP in 1-2 weeks Repeat CBC on Monday per GI, further PRBC transfusion as needed as an outpatient Follow-up with Dr. Laural Golden in 3 to 4 weeks  Home Health: Equipment/Devices:  Discharge Condition: Stable CODE STATUS: DNR Diet recommendation: Heart healthy  Brief/Interim Summary: 71 year old male with history of cirrhosis, coronary artery disease, presents to the emergency room with abnormal labs.  He was found to have low hemoglobin and was sent to the emergency room.  Hemoglobin was noted to be 6.3.  He had complained of dark stools.  On exam, stools were noted to be guaiac positive.  He was admitted to the hospital and was transfused 2 units PRBC.  Follow-up hemoglobin had improved.  He was seen by GI and underwent EGD that showed grade 1 esophageal varices with no stigmata of bleed.  He was noted to have portal hypertensive gastropathy with active bleeding/oozing from antral and prepyloric mucosa.  Hemostatic spray was applied and hemostasis achieved.  He also had portal hypertensive duodenopathy with active bleeding.  This was also controlled with Hemospray.  His diet was advanced and he tolerated this well.  Hemoglobin did appear to be stable.  He will have repeat CBC drawn on Monday per GI and transfuse further PRBC if necessary.  The remainder of his other medical issues remained stable.  Was continued on lactulose and Xifaxan.  He was discharged in stable condition with close outpatient follow-up.  Discharge Diagnoses:  Principal Problem:   Symptomatic anemia Active Problems:   HLD (hyperlipidemia)   NASH Liver cirrhosis secondary to NASH (nonalcoholic steatohepatitis)   Coronary artery disease   Hypothyroidism   Anemia    Discharge  Instructions  Discharge Instructions     Diet - low sodium heart healthy   Complete by: As directed    Increase activity slowly   Complete by: As directed       Allergies as of 09/24/2021   No Known Allergies      Medication List     TAKE these medications    atorvastatin 20 MG tablet Commonly known as: LIPITOR Take 20 mg by mouth at bedtime.   furosemide 20 MG tablet Commonly known as: LASIX Take 20 mg by mouth daily.   isosorbide mononitrate 30 MG 24 hr tablet Commonly known as: IMDUR Take 30 mg by mouth daily.   lactulose 10 GM/15ML solution Commonly known as: CHRONULAC Take 30 mLs (20 g total) by mouth 3 (three) times daily. Goal is 2 bowel movements daily.   levothyroxine 75 MCG tablet Commonly known as: Synthroid Take 0.5 tablets (37.5 mcg total) by mouth daily before breakfast. What changed: Another medication with the same name was removed. Continue taking this medication, and follow the directions you see here.   metoprolol tartrate 25 MG tablet Commonly known as: LOPRESSOR Take 0.5 tablets (12.5 mg total) by mouth 2 (two) times daily.   OMEGA 3 500 PO Take 500 mg by mouth at bedtime.   pantoprazole 40 MG tablet Commonly known as: PROTONIX Take 1 tablet (40 mg total) by mouth 2 (two) times daily before a meal. What changed: when to take this   rifaximin 550 MG Tabs tablet Commonly known as: XIFAXAN Take 1 tablet (550 mg total) by mouth 2 (two) times daily. What changed: additional  instructions   zinc gluconate 50 MG tablet Take 50 mg by mouth daily.        Follow-up Information     Rehman, Mechele Dawley, MD Follow up.   Specialty: Gastroenterology Why: follow up with Dr. Laural Golden next week. Repeat blood test on monday to follow hemoglobin will be arranged by Dr. Laural Golden office Contact information: [place], [place] [address] [place] [place] [address] [phone]                No Known  Allergies  Consultations: Gastroenterology   Procedures/Studies: US Paracentesis  Result Date: 09/21/2021 INDICATION: Nonalcoholic cirrhosis EXAM: ULTRASOUND GUIDED DIAGNOSTIC AND THERAPEUTIC PARACENTESIS MEDICATIONS: None. COMPLICATIONS: None immediate. PROCEDURE: Informed written consent was obtained from the patient after a discussion of the risks, benefits and alternatives to treatment. A timeout was performed prior to the initiation of the procedure. Initial ultrasound scanning demonstrates a large amount of ascites within the right lower abdominal quadrant. The right lower abdomen was prepped and draped in the usual sterile fashion. 1% lidocaine was used for local anesthesia. Following this, a 5 [place] Yueh catheter was introduced. An ultrasound image was saved for documentation purposes. The paracentesis was performed. The catheter was removed and a dressing was applied. The patient tolerated the procedure well without immediate post procedural complication. Patient received post-procedure intravenous albumin; see nursing notes for details. FINDINGS: A total of approximately 3.7 L of amber colored ascitic fluid was removed. Samples were sent to the laboratory as requested by the clinical team. IMPRESSION: Successful ultrasound-guided paracentesis yielding 3.7 liters of peritoneal fluid. Electronically Signed   By: Lavonia Dana M.D.   On: 09/21/2021 11:20   US Paracentesis  Result Date: 09/09/2021 INDICATION: Patient with a history of cirrhosis and possible HCC with recurrent ascites. Interventional Radiology asked to perform a diagnostic and therapeutic paracentesis. EXAM: ULTRASOUND GUIDED PARACENTESIS MEDICATIONS: 1% lidocaine 10 ml COMPLICATIONS: None immediate. PROCEDURE: Informed written consent was obtained from the patient after a discussion of the risks, benefits and alternatives to treatment. A timeout was performed prior to the initiation of the procedure. Initial ultrasound scanning  demonstrates a large amount of ascites within the right lower abdominal quadrant. The right lower abdomen was prepped and draped in the usual sterile fashion. 1% lidocaine was used for local anesthesia. Following this, a 19 gauge, 7-cm, Yueh catheter was introduced. An ultrasound image was saved for documentation purposes. The paracentesis was performed. The catheter was removed and a dressing was applied. The patient tolerated the procedure well without immediate post procedural complication. FINDINGS: A total of approximately 1.6 L of dark yellow/amber-colored fluid was removed. Samples were sent to the laboratory as requested by the clinical team. IMPRESSION: Successful ultrasound-guided paracentesis yielding 1.6 liters of peritoneal fluid. Read by: Soyla Dryer, NP PLAN: The patient has required >/=2 paracenteses in a 30 day period and a formal evaluation by the [facility] has been arranged. Electronically Signed   By: Lavonia Dana M.D.   On: 09/09/2021 11:24      Subjective: No abdominal pain.  He is having bowel movements.  Discharge Exam: Vitals:   09/23/21 1616 09/23/21 2051 09/24/21 0417 09/24/21 0910  BP: 106/63 (!) 110/54 (!) 118/58   Pulse: 60 (!) 56 (!) 58 62  Resp: 20 15 19    Temp: (!) 97.5 F (36.4 C) 97.8 F (36.6 C) 97.7 F (36.5 C)   TempSrc: Oral Oral    SpO2: 100% 98% 98%   Weight:  Height:        General: Pt is alert, awake, not in acute distress Cardiovascular: RRR, S1/S2 +, no rubs, no gallops Respiratory: CTA bilaterally, no wheezing, no rhonchi Abdominal: Soft, NT, ND, bowel sounds + Extremities: no edema, no cyanosis    The results of significant diagnostics from this hospitalization (including imaging, microbiology, ancillary and laboratory) are listed below for reference.     Microbiology: Recent Results (from the past 240 hour(s))  Culture, body fluid w Gram Stain-bottle     Status: None  (Preliminary result)   Collection Time: 09/21/21 10:23 AM   Specimen: Peritoneal Washings  Result Value Ref Range Status   Specimen Description PERITONEAL  Final   Special Requests   Final    BOTTLES DRAWN AEROBIC AND ANAEROBIC Blood Culture adequate volume   Culture   Final    NO GROWTH 3 DAYS Performed at [facility], [address] [place], [place] [address]    Report Status PENDING  Incomplete  Gram stain     Status: None   Collection Time: 09/21/21 10:23 AM   Specimen: Peritoneal Washings  Result Value Ref Range Status   Specimen Description PERITONEAL  Final   Special Requests NONE  Final   Gram Stain   Final    NO ORGANISMS SEEN WBC PRESENT,BOTH PMN AND MONONUCLEAR OTHER CELLS UNIDENTIFIED; SEE CYTOLOGY REPORT Performed at [facility], [address], [place], [place] [address]    Report Status 09/21/2021 FINAL  Final     Labs: BNP (last 3 results) No results for input(s): "BNP" in the last 8760 hours. Basic Metabolic Panel: Recent Labs  Lab 09/22/21 2100 09/23/21 0812  NA 128* 133*  K 4.0 4.1  CL 105 107  CO2 23 24  GLUCOSE 100* 79  BUN 45* 46*  CREATININE 1.54* 1.40*  CALCIUM 8.1* 8.3*  MG  --  2.1   Liver Function Tests: Recent Labs  Lab 09/22/21 2100 09/23/21 0812  AST 154* 147*  ALT 80* 75*  ALKPHOS 110 107  BILITOT 4.1* 5.5*  PROT 4.4* 4.2*  ALBUMIN <1.5* <1.5*   No results for input(s): "LIPASE", "AMYLASE" in the last 168 hours. Recent Labs  Lab 09/23/21 0812  AMMONIA 34   CBC: Recent Labs  Lab 09/22/21 2100 09/23/21 0812 09/23/21 1050 09/23/21 1759 09/24/21 0521  WBC 3.8* 3.2* 3.5* 3.9* 3.2*  NEUTROABS 2.2  --   --   --   --   HGB 6.3* 8.4* 8.1* 8.6* 7.9*  HCT 19.8* 25.8* 25.1* 26.7* 24.6*  MCV 111.2* 102.8* 102.9* 104.3* 102.9*  PLT 63* 51* 54* 53* 50*   Cardiac Enzymes: No results for input(s): "CKTOTAL", "CKMB", "CKMBINDEX", "TROPONINI" in the last 168 hours. BNP: Invalid input(s): "POCBNP" CBG: Recent Labs  Lab  09/23/21 2057  GLUCAP 99   D-Dimer No results for input(s): "DDIMER" in the last 72 hours. Hgb A1c No results for input(s): "HGBA1C" in the last 72 hours. Lipid Profile No results for input(s): "CHOL", "HDL", "LDLCALC", "TRIG", "CHOLHDL", "LDLDIRECT" in the last 72 hours. Thyroid function studies Recent Labs    09/23/21 0812  TSH 6.205*   Anemia work up Recent Labs    09/23/21 0812  VITAMINB12 3,756*  FOLATE 11.6  FERRITIN 83  TIBC 158*  IRON 140  RETICCTPCT 5.2*   Urinalysis    Component Value Date/Time   COLORURINE AMBER (A) 07/17/2021 0645   APPEARANCEUR CLEAR 07/17/2021 0645   LABSPEC 1.017 07/17/2021 0645   PHURINE 5.0 07/17/2021 0645  GLUCOSEU NEGATIVE 07/17/2021 0645   HGBUR NEGATIVE 07/17/2021 0645   BILIRUBINUR NEGATIVE 07/17/2021 0645   KETONESUR NEGATIVE 07/17/2021 0645   PROTEINUR NEGATIVE 07/17/2021 0645   NITRITE NEGATIVE 07/17/2021 0645   LEUKOCYTESUR NEGATIVE 07/17/2021 0645   Sepsis Labs Recent Labs  Lab 09/23/21 0812 09/23/21 1050 09/23/21 1759 09/24/21 0521  WBC 3.2* 3.5* 3.9* 3.2*   Microbiology Recent Results (from the past 240 hour(s))  Culture, body fluid w Gram Stain-bottle     Status: None (Preliminary result)   Collection Time: 09/21/21 10:23 AM   Specimen: Peritoneal Washings  Result Value Ref Range Status   Specimen Description PERITONEAL  Final   Special Requests   Final    BOTTLES DRAWN AEROBIC AND ANAEROBIC Blood Culture adequate volume   Culture   Final    NO GROWTH 3 DAYS Performed at [facility], [address], [place], [place] [address]    Report Status PENDING  Incomplete  Gram stain     Status: None   Collection Time: 09/21/21 10:23 AM   Specimen: Peritoneal Washings  Result Value Ref Range Status   Specimen Description PERITONEAL  Final   Special Requests NONE  Final   Gram Stain   Final    NO ORGANISMS SEEN WBC PRESENT,BOTH PMN AND MONONUCLEAR OTHER CELLS UNIDENTIFIED; SEE CYTOLOGY REPORT Performed at  [facility], [address], [place], [place] [address]    Report Status 09/21/2021 FINAL  Final     Time coordinating discharge: 50mns  SIGNED:   JKathie Dike MD  [facility] 09/24/2021, 8:23 PM   If 7PM-7AM, please contact night-coverage [email or web address]

## 2021-09-24 NOTE — Plan of Care (Signed)

## 2021-09-25 ENCOUNTER — Other Ambulatory Visit (INDEPENDENT_AMBULATORY_CARE_PROVIDER_SITE_OTHER): Payer: Self-pay | Admitting: *Deleted

## 2021-09-25 DIAGNOSIS — D638 Anemia in other chronic diseases classified elsewhere: Secondary | ICD-10-CM

## 2021-09-25 DIAGNOSIS — Z515 Encounter for palliative care: Secondary | ICD-10-CM | POA: Diagnosis not present

## 2021-09-25 DIAGNOSIS — K7469 Other cirrhosis of liver: Secondary | ICD-10-CM | POA: Diagnosis not present

## 2021-09-26 LAB — CULTURE, BODY FLUID W GRAM STAIN -BOTTLE
Culture: NO GROWTH
Special Requests: ADEQUATE

## 2021-09-27 NOTE — ED Provider Notes (Signed)
Cody Austin   CSN: [id number] Arrival date & time: 09/22/21  1949     History  Chief Complaint  Patient presents with   Blood Transfusion    Cody Austin is a 71 y.o. male.  Patient with history of Cody Austin, sent in from gastroenterology chief complaint of abnormal labs trending hemoglobin worsening anemia.  Patient stating that he feels lightheaded and fatigued.  Otherwise denies any fevers or cough or vomiting or diarrhea noticed dark stools a few days ago.       Home Medications Prior to Admission medications   Medication Sig Start Date End Date Taking? Authorizing Provider  atorvastatin (LIPITOR) 20 MG tablet Take 20 mg by mouth at bedtime.  08/25/17  Yes [provider]  furosemide (LASIX) 20 MG tablet Take 20 mg by mouth daily.   Yes [provider]  isosorbide mononitrate (IMDUR) 30 MG 24 hr tablet Take 30 mg by mouth daily. 09/22/21  Yes [provider]  lactulose (CHRONULAC) 10 GM/15ML solution Take 30 mLs (20 g total) by mouth 3 (three) times daily. Goal is 2 bowel movements daily. 07/20/21  Yes [name], MD  metoprolol tartrate (LOPRESSOR) 25 MG tablet Take 0.5 tablets (12.5 mg total) by mouth 2 (two) times daily. 08/10/21  Yes Chandrasekhar, [name], MD  Omega-3 Fatty Acids (OMEGA 3 500 PO) Take 500 mg by mouth at bedtime.   Yes [provider]  rifaximin (XIFAXAN) 550 MG TABS tablet Take 1 tablet (550 mg total) by mouth 2 (two) times daily. Patient taking differently: Take 550 mg by mouth 2 (two) times daily. Continuously 08/12/20  Yes Rehman, Mechele Dawley, MD  zinc gluconate 50 MG tablet Take 50 mg by mouth daily.   Yes [provider]  levothyroxine (SYNTHROID) 75 MCG tablet Take 0.5 tablets (37.5 mcg total) by mouth daily before breakfast. 09/22/21   Rehman, Mechele Dawley, MD  pantoprazole (PROTONIX) 40 MG tablet Take 1 tablet (40 mg total) by mouth 2 (two) times daily before a meal.  09/24/21   [name], MD      Allergies    Patient has no known allergies.    Review of Systems   Review of Systems  Constitutional:  Negative for fever.  HENT:  Negative for ear pain and sore throat.   Eyes:  Negative for pain.  Respiratory:  Negative for cough.   Cardiovascular:  Negative for chest pain.  Gastrointestinal:  Negative for abdominal pain.  Genitourinary:  Negative for flank pain.  Musculoskeletal:  Negative for back pain.  Skin:  Negative for color change and rash.  Neurological:  Negative for syncope.  All other systems reviewed and are negative.   Physical Exam Updated Vital Signs BP (!) 118/58 (BP Location: Right Arm)   Pulse 62   Temp 97.7 F (36.5 C)   Resp 19   Ht 5' 8"  (1.727 m)   Wt 99.5 kg   SpO2 98%   BMI 33.34 kg/m  Physical Exam Constitutional:      Appearance: He is well-developed.  HENT:     Head: Normocephalic.     Nose: Nose normal.  Eyes:     Extraocular Movements: Extraocular movements intact.  Cardiovascular:     Rate and Rhythm: Normal rate.  Pulmonary:     Effort: Pulmonary effort is normal.  Genitourinary:    Comments: Brown/green appearing stool, guaiac positive Skin:    Coloration: Skin is not jaundiced.  Neurological:  Mental Status: He is alert. Mental status is at baseline.     ED Results / Procedures / Treatments   Labs (all labs ordered are listed, but only abnormal results are displayed) Labs Reviewed  CBC WITH DIFFERENTIAL/PLATELET - Abnormal; Notable for the following components:      Result Value   WBC 3.8 (*)    RBC 1.78 (*)    Hemoglobin 6.3 (*)    HCT 19.8 (*)    MCV 111.2 (*)    MCH 35.4 (*)    RDW 18.4 (*)    Platelets 63 (*)    Lymphs Abs 0.6 (*)    All other components within normal limits  COMPREHENSIVE METABOLIC PANEL - Abnormal; Notable for the following components:   Sodium 128 (*)    Glucose, Bld 100 (*)    BUN 45 (*)    Creatinine, Ser 1.54 (*)    Calcium 8.1 (*)    Total  Protein 4.4 (*)    Albumin <1.5 (*)    AST 154 (*)    ALT 80 (*)    Total Bilirubin 4.1 (*)    GFR, Estimated 48 (*)    Anion gap 0 (*)    All other components within normal limits  PROTIME-INR - Abnormal; Notable for the following components:   Prothrombin Time 27.7 (*)    INR 2.6 (*)    All other components within normal limits  COMPREHENSIVE METABOLIC PANEL - Abnormal; Notable for the following components:   Sodium 133 (*)    BUN 46 (*)    Creatinine, Ser 1.40 (*)    Calcium 8.3 (*)    Total Protein 4.2 (*)    Albumin <1.5 (*)    AST 147 (*)    ALT 75 (*)    Total Bilirubin 5.5 (*)    GFR, Estimated 54 (*)    Anion gap 2 (*)    All other components within normal limits  CBC - Abnormal; Notable for the following components:   WBC 3.2 (*)    RBC 2.51 (*)    Hemoglobin 8.4 (*)    HCT 25.8 (*)    MCV 102.8 (*)    RDW 20.8 (*)    Platelets 51 (*)    All other components within normal limits  CBC - Abnormal; Notable for the following components:   WBC 3.5 (*)    RBC 2.44 (*)    Hemoglobin 8.1 (*)    HCT 25.1 (*)    MCV 102.9 (*)    RDW 21.2 (*)    Platelets 54 (*)    All other components within normal limits  TSH - Abnormal; Notable for the following components:   TSH 6.205 (*)    All other components within normal limits  CBC - Abnormal; Notable for the following components:   WBC 3.9 (*)    RBC 2.56 (*)    Hemoglobin 8.6 (*)    HCT 26.7 (*)    MCV 104.3 (*)    RDW 21.8 (*)    Platelets 53 (*)    All other components within normal limits  VITAMIN B12 - Abnormal; Notable for the following components:   Vitamin B-12 3,756 (*)    All other components within normal limits  IRON AND TIBC - Abnormal; Notable for the following components:   TIBC 158 (*)    Saturation Ratios 88 (*)    All other components within normal limits  RETICULOCYTES - Abnormal; Notable for the following  components:   Retic Ct Pct 5.2 (*)    RBC. 2.49 (*)    Immature Retic Fract 27.8 (*)     All other components within normal limits  CBC - Abnormal; Notable for the following components:   WBC 3.2 (*)    RBC 2.39 (*)    Hemoglobin 7.9 (*)    HCT 24.6 (*)    MCV 102.9 (*)    RDW 21.2 (*)    Platelets 50 (*)    All other components within normal limits  PROTIME-INR - Abnormal; Notable for the following components:   Prothrombin Time 27.8 (*)    INR 2.6 (*)    All other components within normal limits  POC OCCULT BLOOD, ED - Abnormal; Notable for the following components:   Fecal Occult Bld POSITIVE (*)    All other components within normal limits  MAGNESIUM  AMMONIA  FOLATE  FERRITIN  GLUCOSE, CAPILLARY  PREPARE RBC (CROSSMATCH)    EKG EKG Interpretation  Date/Time:  Tuesday September 22 2021 21:17:39 EDT Ventricular Rate:  61 PR Interval:  294 QRS Duration: 111 QT Interval:  434 QTC Calculation: 438 R Axis:   109 Text Interpretation: Sinus rhythm Prolonged PR interval Right axis deviation Low voltage, extremity and precordial leads Nonspecific T abnormalities, inferior leads When compared with ECG of 07/16/2021, ** ** ACUTE MI / STEMI ** ** is no longer present Rightward axis is now present T wave inversion Inferior leads is now present - from evolution of MI Confirmed by Delora Fuel ([id number]) on 09/22/2021 11:08:02 PM  Radiology No results found.  Procedures .Critical Care  Performed by: Luna Fuse, MD Authorized by: Luna Fuse, MD   Critical care provider statement:    Critical care time (minutes):  40   Critical care time was exclusive of:  Separately billable procedures and treating other patients and teaching time   Critical care was necessary to treat or prevent imminent or life-threatening deterioration of the following conditions:  Circulatory failure Comments:     Severe anemia with GI bleed requiring emergent blood transfusion.     Medications Ordered in ED Medications  0.9 %  sodium chloride infusion (10 mL/hr Intravenous New Bag/Given  09/22/21 2238)  pantoprazole (PROTONIX) injection 40 mg (40 mg Intravenous Given 09/22/21 2215)  albumin human 25 % solution 12.5 g (0 g Intravenous Stopped 09/23/21 2156)  albumin human 25 % solution 25 g (25 g Intravenous New Bag/Given 09/24/21 0914)    ED Course/ Medical Decision Making/ A&P                           Medical Decision Making Amount and/or Complexity of Data Reviewed Labs: ordered.  Risk Prescription drug management. Decision regarding hospitalization.   History from patient's family at bedside.  Review of external records shows prior gastroenterology evaluation this past week.  Cardiac monitor showing sinus rhythm.  Anesthesia sent.  Patient appears to have worsening anemia and appears symptomatic.  Will be transfused 2 units of PRBCs.  Rectal exam is guaiac positive.  Started on Protonix.  Admitted to the hospitalist team.        Final Clinical Impression(s) / ED Diagnoses Final diagnoses:  Severe anemia  Gastrointestinal hemorrhage, unspecified gastrointestinal hemorrhage type    Rx / DC Orders ED Discharge Orders          Ordered    pantoprazole (PROTONIX) 40 MG tablet  2 times daily before meals  09/24/21 0938    Increase activity slowly        09/24/21 0938    Diet - low sodium heart healthy        09/24/21 0938              Luna Fuse, MD 09/27/21 2212

## 2021-09-28 ENCOUNTER — Emergency Department (HOSPITAL_COMMUNITY)
Admission: EM | Admit: 2021-09-28 | Discharge: 2021-09-28 | Disposition: A | Discharge status: Home / Self Care | Payer: Medicare Other | Attending: Emergency Medicine | Admitting: Emergency Medicine

## 2021-09-28 ENCOUNTER — Other Ambulatory Visit: Payer: Self-pay

## 2021-09-28 ENCOUNTER — Encounter (HOSPITAL_COMMUNITY): Payer: Self-pay | Admitting: Internal Medicine

## 2021-09-28 ENCOUNTER — Other Ambulatory Visit (HOSPITAL_COMMUNITY)
Admission: RE | Admit: 2021-09-28 | Discharge: 2021-09-28 | Disposition: A | Discharge status: Home / Self Care | Payer: Medicare Other | Source: Ambulatory Visit | Attending: Internal Medicine | Admitting: Internal Medicine

## 2021-09-28 DIAGNOSIS — D638 Anemia in other chronic diseases classified elsewhere: Secondary | ICD-10-CM

## 2021-09-28 DIAGNOSIS — D649 Anemia, unspecified: Secondary | ICD-10-CM | POA: Insufficient documentation

## 2021-09-28 DIAGNOSIS — R001 Bradycardia, unspecified: Secondary | ICD-10-CM | POA: Insufficient documentation

## 2021-09-28 DIAGNOSIS — I2510 Atherosclerotic heart disease of native coronary artery without angina pectoris: Secondary | ICD-10-CM | POA: Diagnosis not present

## 2021-09-28 DIAGNOSIS — E039 Hypothyroidism, unspecified: Secondary | ICD-10-CM | POA: Diagnosis not present

## 2021-09-28 DIAGNOSIS — Z79899 Other long term (current) drug therapy: Secondary | ICD-10-CM | POA: Diagnosis not present

## 2021-09-28 LAB — CBC WITH DIFFERENTIAL/PLATELET
Abs Immature Granulocytes: 0.02 10*3/uL (ref 0.00–0.07)
Abs Immature Granulocytes: 0.02 10*3/uL (ref 0.00–0.07)
Basophils Absolute: 0 10*3/uL (ref 0.0–0.1)
Basophils Absolute: 0 10*3/uL (ref 0.0–0.1)
Basophils Relative: 1 %
Basophils Relative: 1 %
Eosinophils Absolute: 0.1 10*3/uL (ref 0.0–0.5)
Eosinophils Absolute: 0.1 10*3/uL (ref 0.0–0.5)
Eosinophils Relative: 2 %
Eosinophils Relative: 3 %
HCT: 23.9 % — ABNORMAL LOW (ref 39.0–52.0)
HCT: 24.4 % — ABNORMAL LOW (ref 39.0–52.0)
Hemoglobin: 7.6 g/dL — ABNORMAL LOW (ref 13.0–17.0)
Hemoglobin: 7.9 g/dL — ABNORMAL LOW (ref 13.0–17.0)
Immature Granulocytes: 1 %
Immature Granulocytes: 1 %
Lymphocytes Relative: 21 %
Lymphocytes Relative: 18 %
Lymphs Abs: 0.9 10*3/uL (ref 0.7–4.0)
Lymphs Abs: 0.7 10*3/uL (ref 0.7–4.0)
MCH: 33.8 pg (ref 26.0–34.0)
MCH: 34.2 pg — ABNORMAL HIGH (ref 26.0–34.0)
MCHC: 31.8 g/dL (ref 30.0–36.0)
MCHC: 32.4 g/dL (ref 30.0–36.0)
MCV: 106.2 fL — ABNORMAL HIGH (ref 80.0–100.0)
MCV: 105.6 fL — ABNORMAL HIGH (ref 80.0–100.0)
Monocytes Absolute: 0.8 10*3/uL (ref 0.1–1.0)
Monocytes Absolute: 0.8 10*3/uL (ref 0.1–1.0)
Monocytes Relative: 18 %
Monocytes Relative: 21 %
Neutro Abs: 2.4 10*3/uL (ref 1.7–7.7)
Neutro Abs: 2 10*3/uL (ref 1.7–7.7)
Neutrophils Relative %: 57 %
Neutrophils Relative %: 56 %
Platelets: 69 10*3/uL — ABNORMAL LOW (ref 150–400)
Platelets: 56 10*3/uL — ABNORMAL LOW (ref 150–400)
RBC: 2.25 MIL/uL — ABNORMAL LOW (ref 4.22–5.81)
RBC: 2.31 MIL/uL — ABNORMAL LOW (ref 4.22–5.81)
RDW: 22.3 % — ABNORMAL HIGH (ref 11.5–15.5)
RDW: 21.3 % — ABNORMAL HIGH (ref 11.5–15.5)
Smear Review: DECREASED
WBC: 4.1 10*3/uL (ref 4.0–10.5)
WBC: 3.6 10*3/uL — ABNORMAL LOW (ref 4.0–10.5)
nRBC: 0 % (ref 0.0–0.2)
nRBC: 0 % (ref 0.0–0.2)

## 2021-09-28 LAB — PREPARE RBC (CROSSMATCH)

## 2021-09-28 MED ORDER — SODIUM CHLORIDE 0.9% IV SOLUTION: Freq: Once | INTRAVENOUS | Status: DC

## 2021-09-28 NOTE — ED Triage Notes (Signed)
Pt sent from Dr. Laural Golden office for low hemoglobin and blood transfusion, history of Cirrhosis of the liver.

## 2021-09-28 NOTE — ED Notes (Signed)
Dc instructions reviewed with pt will follow up with GI asap.

## 2021-09-28 NOTE — Discharge Instructions (Signed)
Call Dr. Olevia Perches office tomorrow to arrange follow-up.  Return to ER for any new or worsening symptoms

## 2021-09-28 NOTE — ED Provider Notes (Cosign Needed Addendum)
[facility] Provider Note   CSN: [id number] Arrival date & time: 09/28/21  1259     History  Chief Complaint  Patient presents with   Anemia    Cody Austin is a 71 y.o. male.   Anemia Pertinent negatives include no chest pain, no headaches and no shortness of breath.       Cody Austin is a 71 y.o. male with past medical history that includes nonalcoholic cirrhosis, NSTEMI, hypothyroidism, CAD and chronic anemia.  Seen here on 09/24/2021 sent from [facility] office with complaint of worsening anemia.  Patient was having symptoms at that time.  He was found to have a hemoglobin of 6.3.  He was admitted and transfused for 2 units of blood.  He underwent EGD during his hospital admission and was found to have bleeding from gastric antrum and duodenum.  This was treated with hemostatic spray.  He notes doing well since his discharge home.  His discharge plan included repeat CBC today and GI follow-up.  Plan included additional transfusion if necessary.  Patient complains of generalized weakness, he denies any black or bloody stools, or vomiting.  He is scheduled to have paracentesis this week. Denies confusion  Patient was advised by GI to come to ER for additional blood transfusion.   Home Medications Prior to Admission medications   Medication Sig Start Date End Date Taking? Authorizing Provider  atorvastatin (LIPITOR) 20 MG tablet Take 20 mg by mouth at bedtime.  08/25/17   [provider]  furosemide (LASIX) 20 MG tablet Take 20 mg by mouth daily.    [provider]  isosorbide mononitrate (IMDUR) 30 MG 24 hr tablet Take 30 mg by mouth daily. 09/22/21   [provider]  lactulose (CHRONULAC) 10 GM/15ML solution Take 30 mLs (20 g total) by mouth 3 (three) times daily. Goal is 2 bowel movements daily. 07/20/21   [name], MD  levothyroxine (SYNTHROID) 75 MCG tablet Take 0.5 tablets (37.5 mcg total) by mouth daily before  breakfast. 09/22/21   Rehman, Mechele Dawley, MD  metoprolol tartrate (LOPRESSOR) 25 MG tablet Take 0.5 tablets (12.5 mg total) by mouth 2 (two) times daily. 08/10/21   Chandrasekhar, Terisa Starr, MD  Omega-3 Fatty Acids (OMEGA 3 500 PO) Take 500 mg by mouth at bedtime.    [provider]  pantoprazole (PROTONIX) 40 MG tablet Take 1 tablet (40 mg total) by mouth 2 (two) times daily before a meal. 09/24/21   [name], MD  rifaximin (XIFAXAN) 550 MG TABS tablet Take 1 tablet (550 mg total) by mouth 2 (two) times daily. Patient taking differently: Take 550 mg by mouth 2 (two) times daily. Continuously 08/12/20   Rogene Houston, MD  zinc gluconate 50 MG tablet Take 50 mg by mouth daily.    [provider]      Allergies    Patient has no known allergies.    Review of Systems   Review of Systems  Constitutional:  Negative for chills and fever.  Respiratory:  Negative for shortness of breath.   Cardiovascular:  Negative for chest pain.  Gastrointestinal:  Positive for abdominal distention. Negative for blood in stool, diarrhea, nausea and vomiting.  Musculoskeletal:  Negative for arthralgias.  Skin:  Negative for rash.  Neurological:  Positive for weakness. Negative for dizziness and headaches.    Physical Exam Updated Vital Signs BP (!) 134/59   Pulse (!) 57   Temp 97.7 F (36.5 C) (Oral)  Resp 15   Ht 5' 8"  (1.727 m)   Wt 99.5 kg   SpO2 99%   BMI 33.35 kg/m  Physical Exam Vitals and nursing note reviewed.  Constitutional:      General: He is not in acute distress.    Appearance: Normal appearance. He is ill-appearing. He is not toxic-appearing.  HENT:     Mouth/Throat:     Mouth: Mucous membranes are moist.  Eyes:     General: No scleral icterus. Cardiovascular:     Rate and Rhythm: Regular rhythm. Bradycardia present.     Pulses: Normal pulses.  Pulmonary:     Effort: Pulmonary effort is normal. No respiratory distress.  Chest:     Chest wall: No  tenderness.  Abdominal:     Tenderness: There is no abdominal tenderness.  Musculoskeletal:        General: Normal range of motion.  Skin:    General: Skin is warm.     Capillary Refill: Capillary refill takes less than 2 seconds.     Findings: No rash.  Neurological:     General: No focal deficit present.     Mental Status: He is alert.     Sensory: No sensory deficit.     Motor: No weakness.     ED Results / Procedures / Treatments   Labs (all labs ordered are listed, but only abnormal results are displayed) Labs Reviewed  CBC WITH DIFFERENTIAL/PLATELET - Abnormal; Notable for the following components:      Result Value   WBC 3.6 (*)    RBC 2.31 (*)    Hemoglobin 7.9 (*)    HCT 24.4 (*)    MCV 105.6 (*)    MCH 34.2 (*)    RDW 21.3 (*)    All other components within normal limits  PREPARE RBC (CROSSMATCH)    EKG None  Radiology No results found.  Procedures Procedures    Medications Ordered in ED Medications  0.9 %  sodium chloride infusion (Manually program via Guardrails IV Fluids) (has no administration in time range)    ED Course/ Medical Decision Making/ A&P                           Medical Decision Making Patient sent here by GI's office for symptomatic anemia.  History of [name] and acute on chronic anemia.  He was admitted to this hospital on 09/24/2021, received blood transfusion of 2 units during his hospital stay underwent EGD with source of bleeding found and treated with Hemospray.  He had follow-up lab this morning and noted to have hemoglobin of 7.94 days ago that dropped to 7.6 this morning.  He was advised to return to the emergency department for additional blood transfusion.  On exam, patient is frail appearing nontoxic-appearing.  Mildly bradycardic but otherwise vital signs reassuring.  No reported melena, hematochezia or hematemesis.  Patient will be transfused 1 unit and if improving will likely be discharged and follow-up with GI  provider.  Amount and/or Complexity of Data Reviewed Labs: ordered.    Details: Posttransfusion CBC shows hemoglobin now of 7.9, improved from 7.6 from this morning. Discussion of management or test interpretation with external provider(s): On recheck, patient has ambulated to the restroom without difficulty.  He reports feeling better.  I feel that he is appropriate for discharge home and he will follow-up closely with GI provider.  Return precautions were discussed.  Risk Prescription drug management.  CRITICAL CARE Performed by: Abie Killian Total critical care time: 40 minutes Critical care time was exclusive of separately billable procedures and treating other patients. Critical care was necessary to treat or prevent imminent or life-threatening deterioration. Critical care was time spent personally by me on the following activities: development of treatment plan with patient and/or surrogate as well as nursing, discussions with consultants, evaluation of patient's response to treatment, examination of patient, obtaining history from patient or surrogate, ordering and performing treatments and interventions, ordering and review of laboratory studies, ordering and review of radiographic studies, pulse oximetry and re-evaluation of patient's condition.   Consulted GI, Dr. Melony Overly and discussed findings and post transfusion CBC results.  Patient was given option of staying in the ER for additional unit of blood this evening or have repeat CBC on Wednesday and additional transfusion at that time if necessary.  Patient prefers to go home at this time and he will follow-up with GI on Wednesday.    Final Clinical Impression(s) / ED Diagnoses Final diagnoses:  Anemia, unspecified type    Rx / DC Orders ED Discharge Orders     None         Kem Parkinson, PA-C 09/28/21 1849    Kem Parkinson, PA-C 09/28/21 1851    Carrel Leather, PA-C 09/28/21 1905    Godfrey Pick,  MD 09/30/21 [phone]

## 2021-09-29 ENCOUNTER — Other Ambulatory Visit (INDEPENDENT_AMBULATORY_CARE_PROVIDER_SITE_OTHER): Payer: Self-pay | Admitting: *Deleted

## 2021-09-29 DIAGNOSIS — K7460 Unspecified cirrhosis of liver: Secondary | ICD-10-CM

## 2021-09-29 DIAGNOSIS — D638 Anemia in other chronic diseases classified elsewhere: Secondary | ICD-10-CM

## 2021-09-29 LAB — BPAM RBC
Blood Product Expiration Date: 202307062359
ISSUE DATE / TIME: 202306121450
Unit Type and Rh: 6200

## 2021-09-29 LAB — TYPE AND SCREEN
ABO/RH(D): A POS
Antibody Screen: NEGATIVE
Unit division: 0

## 2021-09-30 ENCOUNTER — Other Ambulatory Visit (HOSPITAL_COMMUNITY)
Admission: RE | Admit: 2021-09-30 | Discharge: 2021-09-30 | Disposition: A | Discharge status: Home / Self Care | Payer: Medicare Other | Source: Ambulatory Visit | Attending: Internal Medicine | Admitting: Internal Medicine

## 2021-09-30 ENCOUNTER — Ambulatory Visit (HOSPITAL_COMMUNITY)
Admission: RE | Admit: 2021-09-30 | Discharge: 2021-09-30 | Disposition: A | Discharge status: Home / Self Care | Payer: Medicare Other | Source: Ambulatory Visit | Attending: Internal Medicine | Admitting: Internal Medicine

## 2021-09-30 ENCOUNTER — Encounter (HOSPITAL_COMMUNITY): Payer: Self-pay

## 2021-09-30 DIAGNOSIS — D638 Anemia in other chronic diseases classified elsewhere: Secondary | ICD-10-CM | POA: Insufficient documentation

## 2021-09-30 DIAGNOSIS — K7460 Unspecified cirrhosis of liver: Secondary | ICD-10-CM | POA: Diagnosis not present

## 2021-09-30 DIAGNOSIS — K7581 Nonalcoholic steatohepatitis (NASH): Secondary | ICD-10-CM | POA: Insufficient documentation

## 2021-09-30 DIAGNOSIS — R188 Other ascites: Secondary | ICD-10-CM | POA: Insufficient documentation

## 2021-09-30 LAB — GLUCOSE, PLEURAL OR PERITONEAL FLUID: Glucose, Fluid: 91 mg/dL

## 2021-09-30 LAB — GRAM STAIN

## 2021-09-30 LAB — PROTEIN, PLEURAL OR PERITONEAL FLUID: Total protein, fluid: <3 g/dL

## 2021-09-30 LAB — HEMOGLOBIN AND HEMATOCRIT, BLOOD
HCT: 24.4 % — ABNORMAL LOW (ref 39.0–52.0)
Hemoglobin: 7.8 g/dL — ABNORMAL LOW (ref 13.0–17.0)

## 2021-09-30 LAB — PROTIME-INR
INR: 2.4 — ABNORMAL HIGH (ref 0.8–1.2)
Prothrombin Time: 26.3 seconds — ABNORMAL HIGH (ref 11.4–15.2)

## 2021-09-30 NOTE — Procedures (Addendum)
PROCEDURE SUMMARY:  Successful ultrasound guided paracentesis from the RLQ.  Yielded 4.5L of yellow fluid.  No immediate complications.  The patient tolerated the procedure well.   Specimen was sent for labs.  EBL < 75m  The [facility] for potential future intervention.    Electronically Signed: HPasty Spillers PA-C 09/30/2021, 9:46 AM

## 2021-09-30 NOTE — Progress Notes (Signed)
PT tolerated right sided paracentesis procedure well today and 4.5 Liters of clear dark yellow fluid removed with labs collected and sent for processing. PT verbalized understanding of discharge instructions and left via wheelchair with family at this time with no acute distress noted.

## 2021-10-01 ENCOUNTER — Ambulatory Visit
Admission: RE | Admit: 2021-10-01 | Discharge: 2021-10-01 | Disposition: A | Discharge status: Home / Self Care | Payer: Medicare Other | Source: Ambulatory Visit | Attending: Internal Medicine | Admitting: Internal Medicine

## 2021-10-01 DIAGNOSIS — K7460 Unspecified cirrhosis of liver: Secondary | ICD-10-CM

## 2021-10-01 NOTE — Consult Note (Signed)
Chief Complaint: Patient was seen in consultation today for portal hypertension  Referring Physician(s): Hildred Laser, MD  History of Present Illness: Cody Austin is a 71 y.o. male with history of NASH cirrhosis and recurrent ascites.  He presents today alongside his wife, [name], and son, Catalina Antigua, who is a Industrial/product designer in [place].  Mr. Lyness was sent as a gracious referral from Dr. Laural Golden after he was review as part of our Portal Hypertension Clinic due to frequency of paracenteses.  He is currently receiving paracentesis approximately every 2 weeks yielding 4.5 most recently on 09/30/21.  In this review he was noted to have several complex, confounding problems including main portal vein stenosis likely secondary to chronic non-occlusive thrombus, a massive splenorenal shunt, advanced cirrhosis, hepatocellular carcinoma, hepatorenal syndrome, and hyperbilirubinemia.  He was recently admitted to [facility] with acute anemia and melena, to complicate his case further, and underwent EGD (09/23/21) which showed grade 1 esophageal varices and moderate diffuse portal hypertensive gastropathy with antral bleeding that was controlled with hemostatic spray.  He required blood transfusion during this admission.    He states that he feels very fatigued and weak.  His family states that he is limited in mobility around the house and spends most of his time on the couch.  He is compliant with lactulose and rifaximin, with last episode of encephalopathy last August.  He has been evaluated for liver transplant at [facility] but deemed not a candidate due to coronary artery disease, which is currently managed medically.  He is a retired Art gallery manager, married with 3 adult children.    Past Medical History:  Diagnosis Date   AKI (acute kidney injury) ([place]) 07/17/2021   Arthritis    Cirrhosis, non-alcoholic ([place])    Coronary artery disease    Fatty liver    Heart murmur    History of kidney stones     Hypothyroidism 07/17/2021   NASH Liver cirrhosis secondary to NASH (nonalcoholic steatohepatitis) 06/01/2019   NSTEMI (non-ST elevated myocardial infarction) ([place])    Other pancytopenia ([place]) 11/19/2014   Peripheral arterial disease ([place]) 05/28/2019   Sleep apnea    uses CPAP    Past Surgical History:  Procedure Laterality Date   CATARACT EXTRACTION W/PHACO Right 08/12/2014   Procedure: CATARACT EXTRACTION PHACO AND INTRAOCULAR LENS PLACEMENT ([place]);  Surgeon: [name], MD;  Location: [facility] ORS;  Service: Ophthalmology;  Laterality: Right;  CDE 9.32   CATARACT EXTRACTION W/PHACO Left 08/26/2014   Procedure: CATARACT EXTRACTION PHACO AND INTRAOCULAR LENS PLACEMENT (IOC);  Surgeon: [name], MD;  Location: [facility] ORS;  Service: Ophthalmology;  Laterality: Left;  CDE:  9.49   COLONOSCOPY N/A 12/13/2014   Procedure: COLONOSCOPY;  Surgeon: Rogene Houston, MD;  Location: [facility] ENDO SUITE;  Service: Endoscopy;  Laterality: N/A;  100   ESOPHAGEAL BANDING N/A 08/31/2019   Procedure: ESOPHAGEAL BANDING;  Surgeon: Rogene Houston, MD;  Location: [facility] ENDO SUITE;  Service: Endoscopy;  Laterality: N/A;   ESOPHAGOGASTRODUODENOSCOPY N/A 12/13/2014   Procedure: ESOPHAGOGASTRODUODENOSCOPY (EGD);  Surgeon: Rogene Houston, MD;  Location: [facility];  Service: Endoscopy;  Laterality: N/A;   ESOPHAGOGASTRODUODENOSCOPY (EGD) WITH PROPOFOL N/A 08/31/2019   Procedure: ESOPHAGOGASTRODUODENOSCOPY (EGD) WITH PROPOFOL;  Surgeon: Rogene Houston, MD;  Location: [facility];  Service: Endoscopy;  Laterality: N/A;  1200   ESOPHAGOGASTRODUODENOSCOPY (EGD) WITH PROPOFOL N/A 10/01/2020   Procedure: ESOPHAGOGASTRODUODENOSCOPY (EGD) WITH PROPOFOL;  Surgeon: Rogene Houston, MD;  Location: [facility];  Service:  Endoscopy;  Laterality: N/A;  10:00   ESOPHAGOGASTRODUODENOSCOPY (EGD) WITH PROPOFOL N/A 09/23/2021   Procedure: ESOPHAGOGASTRODUODENOSCOPY (EGD) WITH PROPOFOL;  Surgeon: Rogene Houston, MD;  Location: [facility];   Service: Endoscopy;  Laterality: N/A;   HERNIA REPAIR Right    Inguinal hernia   TONSILLECTOMY      Allergies: Patient has no known allergies.  Medications: Prior to Admission medications   Medication Sig Start Date End Date Taking? Authorizing Provider  atorvastatin (LIPITOR) 20 MG tablet Take 20 mg by mouth at bedtime.  08/25/17   [provider]  furosemide (LASIX) 20 MG tablet Take 20 mg by mouth daily.    [provider]  isosorbide mononitrate (IMDUR) 30 MG 24 hr tablet Take 30 mg by mouth daily. 09/22/21   [provider]  lactulose (CHRONULAC) 10 GM/15ML solution Take 30 mLs (20 g total) by mouth 3 (three) times daily. Goal is 2 bowel movements daily. 07/20/21   [name], MD  levothyroxine (SYNTHROID) 75 MCG tablet Take 0.5 tablets (37.5 mcg total) by mouth daily before breakfast. 09/22/21   Rehman, Mechele Dawley, MD  metoprolol tartrate (LOPRESSOR) 25 MG tablet Take 0.5 tablets (12.5 mg total) by mouth 2 (two) times daily. 08/10/21   Chandrasekhar, Terisa Starr, MD  Omega-3 Fatty Acids (OMEGA 3 500 PO) Take 500 mg by mouth at bedtime.    [provider]  pantoprazole (PROTONIX) 40 MG tablet Take 1 tablet (40 mg total) by mouth 2 (two) times daily before a meal. 09/24/21   [name], MD  rifaximin (XIFAXAN) 550 MG TABS tablet Take 1 tablet (550 mg total) by mouth 2 (two) times daily. Patient taking differently: Take 550 mg by mouth 2 (two) times daily. Continuously 08/12/20   Rogene Houston, MD  zinc gluconate 50 MG tablet Take 50 mg by mouth daily.    [provider]     Family History  Problem Relation Age of Onset   Cancer Father    Aneurysm Father     Social History   Socioeconomic History   Marital status: Married    Spouse name: Not on file   Number of children: Not on file   Years of education: Not on file   Highest education level: Not on file  Occupational History   Not on file  Tobacco Use   Smoking status:  Never    Passive exposure: Never   Smokeless tobacco: Never  Vaping Use   Vaping Use: Never used  Substance and Sexual Activity   Alcohol use: Not Currently   Drug use: No   Sexual activity: Yes  Other Topics Concern   Not on file  Social History Narrative   Not on file   Social Determinants of Health   Financial Resource Strain: Not on file  Food Insecurity: Not on file  Transportation Needs: Not on file  Physical Activity: Not on file  Stress: Not on file  Social Connections: Not on file    Review of Systems: A 12 point ROS discussed and pertinent positives are indicated in the HPI above.  All other systems are negative.  Vital Signs: BP (!) 111/51 (BP Location: Right Arm)   Pulse (!) 56   SpO2 98%   Physical Exam Constitutional:      General: He is not in acute distress. HENT:     Head: Normocephalic.     Mouth/Throat:     Mouth: Mucous membranes are moist.  Eyes:  General: No scleral icterus. Cardiovascular:     Rate and Rhythm: Normal rate and regular rhythm.     Heart sounds: Normal heart sounds.  Pulmonary:     Effort: Pulmonary effort is normal.     Breath sounds: Normal breath sounds.  Abdominal:     General: There is no distension.  Musculoskeletal:        General: Swelling present. Normal range of motion.  Skin:    General: Skin is warm and dry.     Coloration: Skin is not jaundiced.  Neurological:     Mental Status: He is alert and oriented to person, place, and time.     Imaging: CT [facility] 10/29/20  Patent portal system, large splenorenal and gastrorenal shunts, wall-adherent, non-occlusive chronic appearing thrombus in main portal near confluence of SMV/splenic, non-dilated/slightly diminutive main portal vein   MR 06/15/21  Non-occlusive thrombus in main portal vein, increased diminutive appearance of main portal and intrahepatic portal veins.    Massive splenorenal shunt/varices    Gastroesophageal varices, enlarged splenic vein and  SMV, diminutive main portal  Echocardiogram: 04/09/21   Labs:  CBC: Recent Labs    09/23/21 1759 09/24/21 0521 09/28/21 0836 09/28/21 1815 09/30/21 1025  WBC 3.9* 3.2* 4.1 3.6*  --   HGB 8.6* 7.9* 7.6* 7.9* 7.8*  HCT 26.7* 24.6* 23.9* 24.4* 24.4*  PLT 53* 50* 69* 56*  --     COAGS: Recent Labs    09/17/21 1006 09/22/21 2100 09/24/21 0521 09/30/21 1025  INR 1.8* 2.6* 2.6* 2.4*    BMP: Recent Labs    07/19/21 0250 07/20/21 0159 07/28/21 1546 08/20/21 1018 09/17/21 1006 09/22/21 2100 09/23/21 0812  NA 138 134*   < > 134* 133* 128* 133*  K 4.1 3.7   < > 4.1 4.2 4.0 4.1  CL 103 104   < > 102 104 105 107  CO2 31 26   < > _0 GLUCOSE 103* 108*   < > 92 73 100* 79  BUN 42* 28*   < > 31* 35* 45* 46*  CALCIUM 8.2* 7.7*   < > 7.8* 7.9* 8.1* 8.3*  CREATININE 1.58* 1.43*   < > 1.44* 1.54* 1.54* 1.40*  GFRNONAA 47* 53*  --   --   --  48* 54*   < > = values in this interval not displayed.    LIVER FUNCTION TESTS: Recent Labs    07/19/21 0250 07/20/21 0159 07/28/21 1546 08/20/21 1018 09/17/21 1006 09/22/21 2100 09/23/21 0812  BILITOT 4.9* 5.0*   < > 4.5* 3.9* 4.1* 5.5*  AST 74* 86*   < > 420* 124* 154* 147*  ALT 41 45*   < > 120* 64* 80* 75*  ALKPHOS 64 60  --   --   --  110 107  PROT 3.9* 4.0*   < > 4.6* 4.6* 4.4* 4.2*  ALBUMIN 1.9* 1.9*  --   --   --  <1.5* <1.5*   < > = values in this interval not displayed.    TUMOR MARKERS: Recent Labs    03/25/21 0934 07/06/21 1156 08/20/21 1018  AFPTM 8.8* 14.6* 10.3*    Child-Pugh = 13 points, class C MELD = 26 (19.6% estimated 3 month mortality) [place] Index of Post-TIPS Survival (FIPS) = 2.04 (Overall survival estimated at 1 month 70.3%, 3 months 33.1%, and 6 months 20.0%)   Assessment and Plan: MUNEER LEIDER is a 71 y.o. male with history of NASH  cirrhosis (Child Pugh C, MELD 26) with recurrent ascites, unifocal hepatocellular carcinoma in segment VIII (1.2 cm, LR4), currently without  treatment options due to liver dysfunction, and history of recent anemia and melena related to hemorrhage from portal hypertensive gastropathy.    On review of cross sectional imaging dating back to 2016, his main portal vein size has decreased and there has been concomitant dilation of the splenic vein and SMV with gradual rise of total bilirubin.  On closer examination, there is a non-occlusive thrombus near the portal confluence in the main portal vein that has been present since 2018.  I suspect this has contributed to worsening hepatic portal perfusion and thus gradually increasing hyperbilirubinemia.  In addition, his prior encephalopathy is almost certainly due to his large splenorenal portosystemic shunt that has developed.  His recent worsening ascites and hepatorenal syndrome have followed suit.    Currently, his overall liver function makes TIPS very high risk.  Given the above findings, I suspect that increasing antegrade/hepatopetal portal flow would likely improve his liver function.  He may eventually be a candidate for TIPS if this occurs.    We discussed the natural history of cirrhosis and portal hypertension, the procedures performed by Interventional Radiology to help improve portal hypertension including the pertinent risks and and benefits of each.  I explained to Mr. Bidinger and his family the option of portal vein angioplasty and possible stent placement to improve antegrade flow.  This could be approached via a percutaneous transhepatic route, percutaneous transsplenic route, or via transjugular transhepatic (TIPS) route.  Given his coagulopathy and anemia, the transjugular transhepatic route would be the safest by far.    We also discussed the risks and benefits of embolization of the splenorenal shunt to decrease portosystemic shunting and increase antegrade/hepatopetal flow, and how this could be performed at the same time.  My prediction is that improving antegrade flow without  creating a TIPS would improve liver function, but may increase portal hypertension overall and result in increased ascites production.  This would require very close monitoring of liver function labs (weekly) and possibly increased frequency of paracenteses.  If the liver function improves significantly, there is a chance we could eventually create a TIPS for further portal hypertension decompression.   Ultimately, and briefly discussed, was the potential to pursue liver directed therapy for his [facility] should his liver function improve remarkably.    Mr. Darley is very interested in being as aggressive as possible as he feels there are no other options to help improve his liver function in light of his contraindication to liver transplant.  I discussed very frankly the severity of his liver disease and how attempting portal vein angioplasty/stenting and shunt embolization has a chance of putting him in to florid, terminal liver failure.  I think this is much less likely than improving liver function by improving perfusion.  He is in agreement and wants to proceed.  Plan for transjugular, transhepatic portal venogram with possible portal vein angioplasty, possible portal vein stent placement, in addition to splenorenal shunt coil embolization.  This will be performed with MAC vs. GA as the discretion of Anesthesia.  The pain from this procedure is expected to be significantly less than that of TIPS creation.  Plan for overnight inpatient observation.    Ruthann Cancer, MD Pager: [phone] Clinic: [phone]    I spent a total of   90 minutes   in face to face in clinical consultation, greater than 50% of which was counseling/coordinating  care for portal hypertension.

## 2021-10-02 ENCOUNTER — Other Ambulatory Visit (HOSPITAL_COMMUNITY): Payer: Self-pay | Admitting: Interventional Radiology

## 2021-10-02 DIAGNOSIS — K7460 Unspecified cirrhosis of liver: Secondary | ICD-10-CM

## 2021-10-05 ENCOUNTER — Emergency Department (HOSPITAL_COMMUNITY): Payer: Medicare Other

## 2021-10-05 ENCOUNTER — Inpatient Hospital Stay (HOSPITAL_COMMUNITY)
Admission: EM | Admit: 2021-10-05 | Discharge: 2021-10-18 | DRG: 853 | Disposition: A | Discharge status: Hospice | Payer: Medicare Other | Attending: Internal Medicine | Admitting: Internal Medicine

## 2021-10-05 ENCOUNTER — Other Ambulatory Visit: Payer: Self-pay

## 2021-10-05 ENCOUNTER — Encounter (HOSPITAL_COMMUNITY): Payer: Self-pay | Admitting: *Deleted

## 2021-10-05 DIAGNOSIS — D631 Anemia in chronic kidney disease: Secondary | ICD-10-CM | POA: Diagnosis present

## 2021-10-05 DIAGNOSIS — I252 Old myocardial infarction: Secondary | ICD-10-CM | POA: Diagnosis not present

## 2021-10-05 DIAGNOSIS — I2510 Atherosclerotic heart disease of native coronary artery without angina pectoris: Secondary | ICD-10-CM | POA: Diagnosis present

## 2021-10-05 DIAGNOSIS — R918 Other nonspecific abnormal finding of lung field: Secondary | ICD-10-CM | POA: Diagnosis not present

## 2021-10-05 DIAGNOSIS — R188 Other ascites: Secondary | ICD-10-CM | POA: Diagnosis not present

## 2021-10-05 DIAGNOSIS — I259 Chronic ischemic heart disease, unspecified: Secondary | ICD-10-CM | POA: Diagnosis not present

## 2021-10-05 DIAGNOSIS — D684 Acquired coagulation factor deficiency: Secondary | ICD-10-CM

## 2021-10-05 DIAGNOSIS — Z87442 Personal history of urinary calculi: Secondary | ICD-10-CM

## 2021-10-05 DIAGNOSIS — I739 Peripheral vascular disease, unspecified: Secondary | ICD-10-CM | POA: Diagnosis not present

## 2021-10-05 DIAGNOSIS — R579 Shock, unspecified: Secondary | ICD-10-CM | POA: Diagnosis not present

## 2021-10-05 DIAGNOSIS — E669 Obesity, unspecified: Secondary | ICD-10-CM | POA: Diagnosis present

## 2021-10-05 DIAGNOSIS — E8770 Fluid overload, unspecified: Secondary | ICD-10-CM | POA: Diagnosis not present

## 2021-10-05 DIAGNOSIS — L8990 Pressure ulcer of unspecified site, unspecified stage: Secondary | ICD-10-CM | POA: Insufficient documentation

## 2021-10-05 DIAGNOSIS — Z4682 Encounter for fitting and adjustment of non-vascular catheter: Secondary | ICD-10-CM | POA: Diagnosis not present

## 2021-10-05 DIAGNOSIS — K922 Gastrointestinal hemorrhage, unspecified: Secondary | ICD-10-CM | POA: Diagnosis not present

## 2021-10-05 DIAGNOSIS — D696 Thrombocytopenia, unspecified: Secondary | ICD-10-CM | POA: Diagnosis present

## 2021-10-05 DIAGNOSIS — C220 Liver cell carcinoma: Secondary | ICD-10-CM | POA: Diagnosis not present

## 2021-10-05 DIAGNOSIS — E039 Hypothyroidism, unspecified: Secondary | ICD-10-CM | POA: Diagnosis present

## 2021-10-05 DIAGNOSIS — K7581 Nonalcoholic steatohepatitis (NASH): Secondary | ICD-10-CM | POA: Diagnosis not present

## 2021-10-05 DIAGNOSIS — I9589 Other hypotension: Secondary | ICD-10-CM | POA: Diagnosis not present

## 2021-10-05 DIAGNOSIS — E872 Acidosis, unspecified: Secondary | ICD-10-CM | POA: Diagnosis present

## 2021-10-05 DIAGNOSIS — I81 Portal vein thrombosis: Secondary | ICD-10-CM | POA: Diagnosis present

## 2021-10-05 DIAGNOSIS — M1990 Unspecified osteoarthritis, unspecified site: Secondary | ICD-10-CM | POA: Diagnosis present

## 2021-10-05 DIAGNOSIS — Z66 Do not resuscitate: Secondary | ICD-10-CM | POA: Diagnosis present

## 2021-10-05 DIAGNOSIS — G4733 Obstructive sleep apnea (adult) (pediatric): Secondary | ICD-10-CM | POA: Diagnosis present

## 2021-10-05 DIAGNOSIS — I8501 Esophageal varices with bleeding: Secondary | ICD-10-CM | POA: Diagnosis not present

## 2021-10-05 DIAGNOSIS — K767 Hepatorenal syndrome: Secondary | ICD-10-CM | POA: Diagnosis not present

## 2021-10-05 DIAGNOSIS — Z515 Encounter for palliative care: Secondary | ICD-10-CM | POA: Diagnosis not present

## 2021-10-05 DIAGNOSIS — Z6835 Body mass index (BMI) 35.0-35.9, adult: Secondary | ICD-10-CM

## 2021-10-05 DIAGNOSIS — H04129 Dry eye syndrome of unspecified lacrimal gland: Secondary | ICD-10-CM | POA: Diagnosis not present

## 2021-10-05 DIAGNOSIS — J9601 Acute respiratory failure with hypoxia: Secondary | ICD-10-CM | POA: Diagnosis present

## 2021-10-05 DIAGNOSIS — A4159 Other Gram-negative sepsis: Principal | ICD-10-CM | POA: Diagnosis present

## 2021-10-05 DIAGNOSIS — Z91048 Other nonmedicinal substance allergy status: Secondary | ICD-10-CM

## 2021-10-05 DIAGNOSIS — D649 Anemia, unspecified: Secondary | ICD-10-CM | POA: Diagnosis present

## 2021-10-05 DIAGNOSIS — R600 Localized edema: Secondary | ICD-10-CM | POA: Diagnosis not present

## 2021-10-05 DIAGNOSIS — R5383 Other fatigue: Secondary | ICD-10-CM | POA: Diagnosis not present

## 2021-10-05 DIAGNOSIS — L89322 Pressure ulcer of left buttock, stage 2: Secondary | ICD-10-CM | POA: Diagnosis not present

## 2021-10-05 DIAGNOSIS — R5381 Other malaise: Secondary | ICD-10-CM | POA: Diagnosis not present

## 2021-10-05 DIAGNOSIS — M3119 Other thrombotic microangiopathy: Secondary | ICD-10-CM | POA: Diagnosis not present

## 2021-10-05 DIAGNOSIS — M2550 Pain in unspecified joint: Secondary | ICD-10-CM | POA: Diagnosis not present

## 2021-10-05 DIAGNOSIS — R578 Other shock: Secondary | ICD-10-CM | POA: Diagnosis not present

## 2021-10-05 DIAGNOSIS — R4781 Slurred speech: Secondary | ICD-10-CM | POA: Diagnosis not present

## 2021-10-05 DIAGNOSIS — Z7189 Other specified counseling: Secondary | ICD-10-CM | POA: Diagnosis not present

## 2021-10-05 DIAGNOSIS — K7460 Unspecified cirrhosis of liver: Secondary | ICD-10-CM | POA: Diagnosis present

## 2021-10-05 DIAGNOSIS — K7469 Other cirrhosis of liver: Secondary | ICD-10-CM

## 2021-10-05 DIAGNOSIS — B961 Klebsiella pneumoniae [K. pneumoniae] as the cause of diseases classified elsewhere: Secondary | ICD-10-CM | POA: Diagnosis present

## 2021-10-05 DIAGNOSIS — Z20822 Contact with and (suspected) exposure to covid-19: Secondary | ICD-10-CM | POA: Diagnosis not present

## 2021-10-05 DIAGNOSIS — Z9889 Other specified postprocedural states: Secondary | ICD-10-CM | POA: Diagnosis not present

## 2021-10-05 DIAGNOSIS — I8510 Secondary esophageal varices without bleeding: Secondary | ICD-10-CM | POA: Diagnosis not present

## 2021-10-05 DIAGNOSIS — K7200 Acute and subacute hepatic failure without coma: Secondary | ICD-10-CM | POA: Diagnosis not present

## 2021-10-05 DIAGNOSIS — K7682 Hepatic encephalopathy: Secondary | ICD-10-CM | POA: Diagnosis present

## 2021-10-05 DIAGNOSIS — I871 Compression of vein: Secondary | ICD-10-CM | POA: Diagnosis present

## 2021-10-05 DIAGNOSIS — N179 Acute kidney failure, unspecified: Secondary | ICD-10-CM | POA: Diagnosis present

## 2021-10-05 DIAGNOSIS — E162 Hypoglycemia, unspecified: Secondary | ICD-10-CM | POA: Diagnosis present

## 2021-10-05 DIAGNOSIS — K2971 Gastritis, unspecified, with bleeding: Secondary | ICD-10-CM | POA: Diagnosis present

## 2021-10-05 DIAGNOSIS — N1831 Chronic kidney disease, stage 3a: Secondary | ICD-10-CM | POA: Diagnosis not present

## 2021-10-05 DIAGNOSIS — D65 Disseminated intravascular coagulation [defibrination syndrome]: Secondary | ICD-10-CM | POA: Diagnosis not present

## 2021-10-05 DIAGNOSIS — R17 Unspecified jaundice: Secondary | ICD-10-CM | POA: Diagnosis not present

## 2021-10-05 DIAGNOSIS — J690 Pneumonitis due to inhalation of food and vomit: Secondary | ICD-10-CM | POA: Diagnosis present

## 2021-10-05 DIAGNOSIS — K921 Melena: Secondary | ICD-10-CM | POA: Diagnosis not present

## 2021-10-05 DIAGNOSIS — Z79899 Other long term (current) drug therapy: Secondary | ICD-10-CM

## 2021-10-05 DIAGNOSIS — I214 Non-ST elevation (NSTEMI) myocardial infarction: Secondary | ICD-10-CM | POA: Diagnosis present

## 2021-10-05 DIAGNOSIS — I248 Other forms of acute ischemic heart disease: Secondary | ICD-10-CM | POA: Diagnosis present

## 2021-10-05 DIAGNOSIS — R531 Weakness: Secondary | ICD-10-CM | POA: Diagnosis not present

## 2021-10-05 DIAGNOSIS — R778 Other specified abnormalities of plasma proteins: Secondary | ICD-10-CM

## 2021-10-05 DIAGNOSIS — K766 Portal hypertension: Secondary | ICD-10-CM | POA: Diagnosis not present

## 2021-10-05 DIAGNOSIS — E871 Hypo-osmolality and hyponatremia: Secondary | ICD-10-CM | POA: Diagnosis not present

## 2021-10-05 DIAGNOSIS — R1310 Dysphagia, unspecified: Secondary | ICD-10-CM | POA: Diagnosis not present

## 2021-10-05 DIAGNOSIS — R571 Hypovolemic shock: Secondary | ICD-10-CM | POA: Diagnosis not present

## 2021-10-05 DIAGNOSIS — E861 Hypovolemia: Secondary | ICD-10-CM | POA: Diagnosis present

## 2021-10-05 DIAGNOSIS — I959 Hypotension, unspecified: Secondary | ICD-10-CM | POA: Diagnosis not present

## 2021-10-05 DIAGNOSIS — E875 Hyperkalemia: Secondary | ICD-10-CM | POA: Diagnosis not present

## 2021-10-05 DIAGNOSIS — K3189 Other diseases of stomach and duodenum: Secondary | ICD-10-CM | POA: Diagnosis present

## 2021-10-05 DIAGNOSIS — R52 Pain, unspecified: Secondary | ICD-10-CM | POA: Diagnosis not present

## 2021-10-05 DIAGNOSIS — Z7401 Bed confinement status: Secondary | ICD-10-CM | POA: Diagnosis not present

## 2021-10-05 DIAGNOSIS — D62 Acute posthemorrhagic anemia: Secondary | ICD-10-CM

## 2021-10-05 DIAGNOSIS — I700 Atherosclerosis of aorta: Secondary | ICD-10-CM | POA: Diagnosis not present

## 2021-10-05 DIAGNOSIS — J90 Pleural effusion, not elsewhere classified: Secondary | ICD-10-CM | POA: Diagnosis not present

## 2021-10-05 DIAGNOSIS — I1310 Hypertensive heart and chronic kidney disease without heart failure, with stage 1 through stage 4 chronic kidney disease, or unspecified chronic kidney disease: Secondary | ICD-10-CM | POA: Diagnosis present

## 2021-10-05 DIAGNOSIS — I350 Nonrheumatic aortic (valve) stenosis: Secondary | ICD-10-CM | POA: Diagnosis present

## 2021-10-05 DIAGNOSIS — I4891 Unspecified atrial fibrillation: Secondary | ICD-10-CM | POA: Diagnosis not present

## 2021-10-05 DIAGNOSIS — R682 Dry mouth, unspecified: Secondary | ICD-10-CM | POA: Diagnosis not present

## 2021-10-05 DIAGNOSIS — Z79890 Hormone replacement therapy (postmenopausal): Secondary | ICD-10-CM

## 2021-10-05 HISTORY — DX: Liver cell carcinoma: C22.0

## 2021-10-05 LAB — CBC WITH DIFFERENTIAL/PLATELET
Abs Immature Granulocytes: 0 10*3/uL (ref 0.00–0.07)
Band Neutrophils: 12 %
Basophils Absolute: 0 10*3/uL (ref 0.0–0.1)
Basophils Relative: 0 %
Eosinophils Absolute: 0 10*3/uL (ref 0.0–0.5)
Eosinophils Relative: 0 %
HCT: 16 % — ABNORMAL LOW (ref 39.0–52.0)
Hemoglobin: 5 g/dL — CL (ref 13.0–17.0)
Lymphocytes Relative: 6 %
Lymphs Abs: 0.5 10*3/uL — ABNORMAL LOW (ref 0.7–4.0)
MCH: 35.7 pg — ABNORMAL HIGH (ref 26.0–34.0)
MCHC: 31.3 g/dL (ref 30.0–36.0)
MCV: 114.3 fL — ABNORMAL HIGH (ref 80.0–100.0)
Monocytes Absolute: 0.3 10*3/uL (ref 0.1–1.0)
Monocytes Relative: 3 %
Neutro Abs: 8.3 10*3/uL — ABNORMAL HIGH (ref 1.7–7.7)
Neutrophils Relative %: 79 %
Platelets: 80 10*3/uL — ABNORMAL LOW (ref 150–400)
RBC: 1.4 MIL/uL — ABNORMAL LOW (ref 4.22–5.81)
RDW: 23.7 % — ABNORMAL HIGH (ref 11.5–15.5)
Smear Review: DECREASED
WBC: 9.1 10*3/uL (ref 4.0–10.5)
nRBC: 0 % (ref 0.0–0.2)

## 2021-10-05 LAB — CBC
HCT: 22.7 % — ABNORMAL LOW (ref 39.0–52.0)
HCT: 23.8 % — ABNORMAL LOW (ref 39.0–52.0)
Hemoglobin: 7.1 g/dL — ABNORMAL LOW (ref 13.0–17.0)
Hemoglobin: 7.9 g/dL — ABNORMAL LOW (ref 13.0–17.0)
MCH: 33.6 pg (ref 26.0–34.0)
MCH: 34.5 pg — ABNORMAL HIGH (ref 26.0–34.0)
MCHC: 31.3 g/dL (ref 30.0–36.0)
MCHC: 33.2 g/dL (ref 30.0–36.0)
MCV: 107.6 fL — ABNORMAL HIGH (ref 80.0–100.0)
MCV: 103.9 fL — ABNORMAL HIGH (ref 80.0–100.0)
Platelets: 61 10*3/uL — ABNORMAL LOW (ref 150–400)
Platelets: 46 10*3/uL — ABNORMAL LOW (ref 150–400)
RBC: 2.11 MIL/uL — ABNORMAL LOW (ref 4.22–5.81)
RBC: 2.29 MIL/uL — ABNORMAL LOW (ref 4.22–5.81)
RDW: 22.8 % — ABNORMAL HIGH (ref 11.5–15.5)
RDW: 22.3 % — ABNORMAL HIGH (ref 11.5–15.5)
WBC: 9.1 10*3/uL (ref 4.0–10.5)
WBC: 10.1 10*3/uL (ref 4.0–10.5)
nRBC: 0.2 % (ref 0.0–0.2)
nRBC: 0.2 % (ref 0.0–0.2)

## 2021-10-05 LAB — COMPREHENSIVE METABOLIC PANEL
ALT: 82 U/L — ABNORMAL HIGH (ref 0–44)
AST: 150 U/L — ABNORMAL HIGH (ref 15–41)
Albumin: <1.5 g/dL — ABNORMAL LOW (ref 3.5–5.0)
Alkaline Phosphatase: 65 U/L (ref 38–126)
Anion gap: 11 (ref 5–15)
BUN: 81 mg/dL — ABNORMAL HIGH (ref 8–23)
CO2: 15 mmol/L — ABNORMAL LOW (ref 22–32)
Calcium: 8.6 mg/dL — ABNORMAL LOW (ref 8.9–10.3)
Chloride: 103 mmol/L (ref 98–111)
Creatinine, Ser: 2.66 mg/dL — ABNORMAL HIGH (ref 0.61–1.24)
GFR, Estimated: 25 mL/min — ABNORMAL LOW (ref >60)
Glucose, Bld: 148 mg/dL — ABNORMAL HIGH (ref 70–99)
Potassium: 4.4 mmol/L (ref 3.5–5.1)
Sodium: 129 mmol/L — ABNORMAL LOW (ref 135–145)
Total Bilirubin: 4.7 mg/dL — ABNORMAL HIGH (ref 0.3–1.2)
Total Protein: <3 g/dL — ABNORMAL LOW (ref 6.5–8.1)

## 2021-10-05 LAB — BLOOD CULTURE ID PANEL (REFLEXED) - BCID2

## 2021-10-05 LAB — URINALYSIS, ROUTINE W REFLEX MICROSCOPIC
Bacteria, UA: NONE SEEN
Bilirubin Urine: NEGATIVE
Glucose, UA: NEGATIVE mg/dL
Hgb urine dipstick: NEGATIVE
Ketones, ur: NEGATIVE mg/dL
Leukocytes,Ua: NEGATIVE
Nitrite: NEGATIVE
Protein, ur: 30 mg/dL — AB
Specific Gravity, Urine: 1.018 (ref 1.005–1.030)
pH: 5 (ref 5.0–8.0)

## 2021-10-05 LAB — PREPARE RBC (CROSSMATCH)

## 2021-10-05 LAB — GLUCOSE, CAPILLARY
Glucose-Capillary: 95 mg/dL (ref 70–99)
Glucose-Capillary: 97 mg/dL (ref 70–99)

## 2021-10-05 LAB — APTT: aPTT: 65 seconds — ABNORMAL HIGH (ref 24–36)

## 2021-10-05 LAB — PROTIME-INR
INR: 4.7 (ref 0.8–1.2)
Prothrombin Time: 43.8 seconds — ABNORMAL HIGH (ref 11.4–15.2)

## 2021-10-05 LAB — CBG MONITORING, ED
Glucose-Capillary: 28 mg/dL — CL (ref 70–99)
Glucose-Capillary: 91 mg/dL (ref 70–99)
Glucose-Capillary: 97 mg/dL (ref 70–99)

## 2021-10-05 LAB — RESP PANEL BY RT-PCR (FLU A&B, COVID) ARPGX2
Influenza A by PCR: NEGATIVE
Influenza B by PCR: NEGATIVE
SARS Coronavirus 2 by RT PCR: NEGATIVE

## 2021-10-05 LAB — CULTURE, BODY FLUID W GRAM STAIN -BOTTLE
Culture: NO GROWTH
Special Requests: ADEQUATE

## 2021-10-05 LAB — LACTIC ACID, PLASMA
Lactic Acid, Venous: 7.1 mmol/L (ref 0.5–1.9)
Lactic Acid, Venous: 8.5 mmol/L (ref 0.5–1.9)

## 2021-10-05 LAB — AMMONIA: Ammonia: 41 umol/L — ABNORMAL HIGH (ref 9–35)

## 2021-10-05 LAB — TROPONIN I (HIGH SENSITIVITY)
Troponin I (High Sensitivity): 231 ng/L (ref <18)
Troponin I (High Sensitivity): 557 ng/L (ref <18)
Troponin I (High Sensitivity): 7131 ng/L (ref <18)

## 2021-10-05 LAB — MRSA NEXT GEN BY PCR, NASAL: MRSA by PCR Next Gen: NOT DETECTED

## 2021-10-05 LAB — POC OCCULT BLOOD, ED: Fecal Occult Bld: POSITIVE — AB

## 2021-10-05 MED ORDER — SODIUM CHLORIDE 0.9% IV SOLUTION: Freq: Once | INTRAVENOUS | Status: DC

## 2021-10-05 MED ORDER — ONDANSETRON HCL 4 MG/2ML IJ SOLN: 4.0000 mg | Freq: Four times a day (QID) | INTRAMUSCULAR | Status: DC | PRN

## 2021-10-05 MED ORDER — SODIUM CHLORIDE 0.9 % IV SOLN: Freq: Once | INTRAVENOUS | Status: AC

## 2021-10-05 MED ORDER — PANTOPRAZOLE 80MG IVPB - SIMPLE MED
80.0000 mg | Freq: Once | INTRAVENOUS | Status: AC
Start: 2021-10-05 — End: 2021-10-05
  Administered 2021-10-05: 80 mg via INTRAVENOUS
  Filled 2021-10-05: qty 100

## 2021-10-05 MED ORDER — SODIUM CHLORIDE 0.9 % IV SOLN
2.0000 g | INTRAVENOUS | Status: DC
Start: 2021-10-06 — End: 2021-10-06

## 2021-10-05 MED ORDER — CEFTRIAXONE SODIUM 2 G IJ SOLR
2.0000 g | INTRAMUSCULAR | Status: DC
  Administered 2021-10-05: 2 g via INTRAVENOUS
  Filled 2021-10-05: qty 20

## 2021-10-05 MED ORDER — VANCOMYCIN VARIABLE DOSE PER UNSTABLE RENAL FUNCTION (PHARMACIST DOSING): Status: DC

## 2021-10-05 MED ORDER — NOREPINEPHRINE 4 MG/250ML-% IV SOLN: 0.0000 ug/min | INTRAVENOUS | Status: DC

## 2021-10-05 MED ORDER — CHLORHEXIDINE GLUCONATE CLOTH 2 % EX PADS: 6.0000 | MEDICATED_PAD | Freq: Every day | CUTANEOUS | Status: DC

## 2021-10-05 MED ORDER — CEFEPIME HCL 2 G IV SOLR: 2.0000 g | Freq: Two times a day (BID) | INTRAVENOUS | Status: DC

## 2021-10-05 MED ORDER — OCTREOTIDE LOAD VIA INFUSION
50.0000 ug | Freq: Once | INTRAVENOUS | Status: AC
  Administered 2021-10-05: 50 ug via INTRAVENOUS
  Filled 2021-10-05: qty 25

## 2021-10-05 MED ORDER — SODIUM CHLORIDE 0.9 % IV SOLN: INTRAVENOUS | Status: DC

## 2021-10-05 MED ORDER — LACTULOSE 10 GM/15ML PO SOLN
20.0000 g | Freq: Three times a day (TID) | ORAL | Status: DC
Start: 2021-10-05 — End: 2021-10-06

## 2021-10-05 MED ORDER — METRONIDAZOLE 500 MG/100ML IV SOLN
500.0000 mg | Freq: Once | INTRAVENOUS | Status: AC
  Administered 2021-10-05: 500 mg via INTRAVENOUS
  Filled 2021-10-05: qty 100

## 2021-10-05 MED ORDER — RIFAXIMIN 550 MG PO TABS
550.0000 mg | ORAL_TABLET | Freq: Two times a day (BID) | ORAL | Status: DC
  Administered 2021-10-06 – 2021-10-07 (×2): 550 mg via ORAL
  Filled 2021-10-05 (×3): qty 1

## 2021-10-05 MED ORDER — DEXTROSE-NACL 5-0.9 % IV SOLN: INTRAVENOUS | Status: DC

## 2021-10-05 MED ORDER — NOREPINEPHRINE 4 MG/250ML-% IV SOLN
2.0000 ug/min | INTRAVENOUS | Status: DC
  Administered 2021-10-05 (×2): 4 ug/min via INTRAVENOUS
  Administered 2021-10-06 (×2): 5 ug/min via INTRAVENOUS
  Filled 2021-10-05 (×3): qty 250

## 2021-10-05 MED ORDER — LACTATED RINGERS IV SOLN: INTRAVENOUS | Status: DC

## 2021-10-05 MED ORDER — VITAMIN K1 10 MG/ML IJ SOLN
10.0000 mg | Freq: Once | INTRAVENOUS | Status: AC
  Administered 2021-10-05: 10 mg via INTRAVENOUS
  Filled 2021-10-05: qty 1

## 2021-10-05 MED ORDER — ALBUMIN HUMAN 25 % IV SOLN
50.0000 g | Freq: Four times a day (QID) | INTRAVENOUS | Status: AC
  Administered 2021-10-05 – 2021-10-06 (×3): 50 g via INTRAVENOUS
  Filled 2021-10-05 (×3): qty 200

## 2021-10-05 MED ORDER — LACTATED RINGERS IV BOLUS (SEPSIS)
500.0000 mL | Freq: Once | INTRAVENOUS | Status: AC
  Administered 2021-10-05: 500 mL via INTRAVENOUS

## 2021-10-05 MED ORDER — SODIUM CHLORIDE 0.9 % IV SOLN
250.0000 mL | INTRAVENOUS | Status: DC
  Administered 2021-10-13 – 2021-10-14 (×2): 250 mL via INTRAVENOUS

## 2021-10-05 MED ORDER — VANCOMYCIN HCL 500 MG/100ML IV SOLN
500.0000 mg | Freq: Once | INTRAVENOUS | Status: AC
  Administered 2021-10-05: 500 mg via INTRAVENOUS
  Filled 2021-10-05: qty 100

## 2021-10-05 MED ORDER — LACTATED RINGERS IV BOLUS (SEPSIS)
1000.0000 mL | Freq: Once | INTRAVENOUS | Status: AC
  Administered 2021-10-05: 1000 mL via INTRAVENOUS

## 2021-10-05 MED ORDER — VANCOMYCIN HCL IN DEXTROSE 1-5 GM/200ML-% IV SOLN
1000.0000 mg | Freq: Once | INTRAVENOUS | Status: AC
  Administered 2021-10-05: 1000 mg via INTRAVENOUS
  Filled 2021-10-05: qty 200

## 2021-10-05 MED ORDER — SODIUM CHLORIDE 0.9 % IV SOLN
50.0000 ug/h | INTRAVENOUS | Status: DC
  Administered 2021-10-05 – 2021-10-07 (×4): 50 ug/h via INTRAVENOUS
  Filled 2021-10-05 (×8): qty 1

## 2021-10-05 MED ORDER — VANCOMYCIN HCL 1250 MG/250ML IV SOLN: 1250.0000 mg | INTRAVENOUS | Status: DC

## 2021-10-05 MED ORDER — PANTOPRAZOLE INFUSION (NEW) - SIMPLE MED
8.0000 mg/h | INTRAVENOUS | Status: DC
  Administered 2021-10-05 – 2021-10-07 (×6): 8 mg/h via INTRAVENOUS
  Filled 2021-10-05: qty 80
  Filled 2021-10-05: qty 100
  Filled 2021-10-05 (×4): qty 80

## 2021-10-05 MED ORDER — SODIUM CHLORIDE 0.9 % IV SOLN
2.0000 g | Freq: Once | INTRAVENOUS | Status: AC
  Administered 2021-10-05: 2 g via INTRAVENOUS
  Filled 2021-10-05: qty 12.5

## 2021-10-05 MED ORDER — DEXTROSE 50 % IV SOLN
25.0000 g | Freq: Once | INTRAVENOUS | Status: AC
  Administered 2021-10-05: 25 g via INTRAVENOUS

## 2021-10-05 MED ORDER — SODIUM CHLORIDE 0.9% IV SOLUTION: Freq: Once | INTRAVENOUS | Status: AC

## 2021-10-05 MED ORDER — SODIUM CHLORIDE 0.9 % IV SOLN: 2.0000 g | INTRAVENOUS | Status: DC

## 2021-10-05 NOTE — H&P (Signed)
Please see consult note dictated by Dr. Melvyn Novas

## 2021-10-05 NOTE — ED Triage Notes (Signed)
Patient AMS with increasing weakness in triage. Hx of cirrhosis with GI bleeding. Per EMS patient CBG 54 and 28 in triage here. Patient hypotensive on scene. Patient alert to voice and has disorientation. Dextrose admin at [phone]

## 2021-10-05 NOTE — ED Notes (Addendum)
3rd unit of blood given to carelink per carelink at this time to administer. This nurse did not administer

## 2021-10-05 NOTE — Sepsis Progress Note (Signed)
Sepsis protocol monitored by eLink 

## 2021-10-05 NOTE — Consult Note (Addendum)
NAME:  Cody Austin, MRN:  [id number], DOB:  [birth year], LOS: 0 ADMISSION DATE:  10/05/2021, CONSULTATION DATE:  6/19 REFERRING MD:  EDP, CHIEF COMPLAINT: weak   History of Present Illness:  48 yowm NCB pt  with NASH cirrhosis  receiving paracentesis approximately every 2 weeks yielding 4.5 most recently on 09/30/21 with w/u c/w main portal vein stenosis likely secondary to chronic non-occlusive thrombus, a massive splenorenal shunt, advanced cirrhosis, hepatocellular carcinoma, hepatorenal syndrome, and hyperbilirubinemia.  He was recently admitted to [facility] with acute anemia and melena, to complicate his case further, and underwent EGD (09/23/21) which showed grade 1 esophageal varices and moderate diffuse portal hypertensive gastropathy with antral bleeding that was controlled with hemostatic spray.  He required blood transfusion during this admission.   Presented am 6/19 to [facility] ER with weakness, confusion  and low cbg/ hgb and PCCM consulted re transfer to [facility] where he is scheduled to undergo attempting portal vein angioplasty/stenting and shunt embolization on 1/65 with main complication = florid terminal liver failure per IR's note 10/01/21 and pt agreed as alternative to repeat massive paracentesis.         Significant Hospital Events: Including procedures, antibiotic start and stop dates in addition to other pertinent events     Interim History / Subjective:  Bp up a bit with one unit prbc's and 2nd bag just hung s specific complaints   Objective   Blood pressure (!) 78/47, pulse 81, temperature (!) 95.8 F (35.4 C), temperature source Rectal, resp. rate (!) 25, height 5' 8"  (1.727 m), weight 80.9 kg, SpO2 95 %.        Intake/Output Summary (Last 24 hours) at 10/05/2021 1325 Last data filed at 10/05/2021 1247 Gross per 24 hour  Intake 4310.19 ml  Output --  Net 4310.19 ml   Filed Weights   10/05/21 0814  Weight: 80.9 kg    Examination: Tmax:  97.2 General  appearance:    chronically illl moribund appearing wm s increased wob   At Rest 02 sats  95% on RA  No jvd Oropharynx clear,  mucosa nl Neck supple Lungs with distant BS bilaterally RRR no s3 or or sign murmur Abd mod distended/ limited excursion  Extr cool with trace pitting edema  both LE's Neuro  Sensorium responds to verbal but turning head but no spont speaking or involvement in decision making     I personally reviewed images and agree with radiology impression as follows:  CXR:   portable am 6/19 Low lung volumes as seen in March with moderate new right lung veiling opacity favored to be new pleural effusion. No overt pulmonary edema.     Assessment & Plan:  1) Circulatory shock due to acute blood loss anemia   Lab Results  Component Value Date   HGB 5.0 (LL) 10/05/2021   HGB 7.8 (L) 09/30/2021   HGB 7.9 (L) 09/28/2021   HGB 13.3 08/13/2019   HGB 14.0 11/15/2016   >>> replace with prb's plus add FFP to prevent further worsenining coagulopahty >>> GI eval then consider palliative care eval/ rx if not responding to reasonable attempts to correct his hgb/ intravasc volume status    2) Acute/ chronic GI bleeding in setting of known G1  varices  >>> Gi re-eval pending/ NBC status may limit what can be offered but pt's daughter clearly states she understands this and accepts the risk.   3)  AKI on CRI  with No uop so far in  pt with presume hepatorenal syndrome and now with low bp>>>  further renal blood flow compromise  Lab Results  Component Value Date   CREATININE 2.66 (H) 10/05/2021   CREATININE 1.40 (H) 09/23/2021   CREATININE 1.54 (H) 09/22/2021    >>> doubt candidate for any form of HD though fm wants to decide this issue "when/if we get there which sounds reasonable if [facility] comfortable with admitting him here >>> discussed with EDP consideration fo IJ cvl to monitor CVP  >>> foley   4) possible small R hepatic hydrothorax, not an issue at present   5 )  coagulopathy Lab Results  Component Value Date   INR 4.7 (HH) 10/05/2021   INR 2.4 (H) 09/30/2021   INR 2.6 (H) 09/24/2021   >>> rx FFP   6) thrombocytopenia due to hypersplenism Lab Results  Component Value Date   PLT 80 (L) 10/05/2021   PLT 56 (L) 09/28/2021   PLT 69 (L) 09/28/2021   PLT 74 (LL) 08/13/2019   PLT 99 (LL) 11/15/2016  No need for plts for now.   7) Transient fasting hypoglycemia, very bad sign in setting of liver failure (limited gluconeogenesis)  but for now corrected with IV dextrose.  Best Practice (right click and "Reselect all SmartList Selections" daily)   Per [facility]   Labs   CBC: Recent Labs  Lab 09/28/21 1815 09/30/21 1025 10/05/21 0822  WBC 3.6*  --  9.1  NEUTROABS 2.0  --  8.3*  HGB 7.9* 7.8* 5.0*  HCT 24.4* 24.4* 16.0*  MCV 105.6*  --  114.3*  PLT 56*  --  80*    Basic Metabolic Panel: Recent Labs  Lab 10/05/21 0822  NA 129*  K 4.4  CL 103  CO2 15*  GLUCOSE 148*  BUN 81*  CREATININE 2.66*  CALCIUM 8.6*   GFR: Estimated Creatinine Clearance: 24.6 mL/min (A) (by C-G formula based on SCr of 2.66 mg/dL (H)). Recent Labs  Lab 09/28/21 1815 10/05/21 0822 10/05/21 1044  WBC 3.6* 9.1  --   LATICACIDVEN  --  7.1* 8.5*    Liver Function Tests: Recent Labs  Lab 10/05/21 0822  AST 150*  ALT 82*  ALKPHOS 65  BILITOT 4.7*  PROT <3.0*  ALBUMIN <1.5*   No results for input(s): "LIPASE", "AMYLASE" in the last 168 hours. Recent Labs  Lab 10/05/21 0907  AMMONIA 41*    ABG    Component Value Date/Time   TCO2 21 (L) 05/28/2019 1549     Coagulation Profile: Recent Labs  Lab 09/30/21 1025 10/05/21 0822  INR 2.4* 4.7*    Cardiac Enzymes: No results for input(s): "CKTOTAL", "CKMB", "CKMBINDEX", "TROPONINI" in the last 168 hours.  HbA1C: No results found for: "HGBA1C"  CBG: Recent Labs  Lab 10/05/21 0811 10/05/21 0822 10/05/21 1246  GLUCAP 28* 91 97       Past Medical History:  He,  has a past medical  history of AKI (acute kidney injury) ([place]) (07/17/2021), Arthritis, Cirrhosis, non-alcoholic ([place]), Coronary artery disease, Fatty liver, Heart murmur, History of kidney stones, Hypothyroidism (07/17/2021), NASH Liver cirrhosis secondary to NASH (nonalcoholic steatohepatitis) (06/01/2019), NSTEMI (non-ST elevated myocardial infarction) ([place]), Other pancytopenia ([place]) (11/19/2014), Peripheral arterial disease ([place]) (05/28/2019), and Sleep apnea.   Surgical History:   Past Surgical History:  Procedure Laterality Date   CATARACT EXTRACTION W/PHACO Right 08/12/2014   Procedure: CATARACT EXTRACTION PHACO AND INTRAOCULAR LENS PLACEMENT (IOC);  Surgeon: [name], MD;  Location: [facility];  Service: Ophthalmology;  Laterality:  Right;  CDE 9.32   CATARACT EXTRACTION W/PHACO Left 08/26/2014   Procedure: CATARACT EXTRACTION PHACO AND INTRAOCULAR LENS PLACEMENT (IOC);  Surgeon: [name], MD;  Location: [facility];  Service: Ophthalmology;  Laterality: Left;  CDE:  9.49   COLONOSCOPY N/A 12/13/2014   Procedure: COLONOSCOPY;  Surgeon: Rogene Houston, MD;  Location: [facility];  Service: Endoscopy;  Laterality: N/A;  100   ESOPHAGEAL BANDING N/A 08/31/2019   Procedure: ESOPHAGEAL BANDING;  Surgeon: Rogene Houston, MD;  Location: [facility];  Service: Endoscopy;  Laterality: N/A;   ESOPHAGOGASTRODUODENOSCOPY N/A 12/13/2014   Procedure: ESOPHAGOGASTRODUODENOSCOPY (EGD);  Surgeon: Rogene Houston, MD;  [facility];  Service: Endoscopy;  Laterality: N/A;   ESOPHAGOGASTRODUODENOSCOPY (EGD) WITH PROPOFOL N/A 08/31/2019   Procedure: ESOPHAGOGASTRODUODENOSCOPY (EGD) WITH PROPOFOL;  Surgeon: Rogene Houston, MD;  [facility];  Service: Endoscopy;  Laterality: N/A;  1200   ESOPHAGOGASTRODUODENOSCOPY (EGD) WITH PROPOFOL N/A 10/01/2020   Procedure: ESOPHAGOGASTRODUODENOSCOPY (EGD) WITH PROPOFOL;  Surgeon: Rogene Houston, MD;  [facility];  Service: Endoscopy;  Laterality: N/A;  10:00    ESOPHAGOGASTRODUODENOSCOPY (EGD) WITH PROPOFOL N/A 09/23/2021   Procedure: ESOPHAGOGASTRODUODENOSCOPY (EGD) WITH PROPOFOL;  Surgeon: Rogene Houston, MD;  Location: [facility];  Service: Endoscopy;  Laterality: N/A;   HERNIA REPAIR Right    Inguinal hernia   TONSILLECTOMY       Social History:   reports that he has never smoked. He has never been exposed to tobacco smoke. He has never used smokeless tobacco. He reports that he does not currently use alcohol. He reports that he does not use drugs.   Family History:  His family history includes Aneurysm in his father; Cancer in his father.   Allergies No Known Allergies   Home Medications  Prior to Admission medications   Medication Sig Start Date End Date Taking? Authorizing Provider  atorvastatin (LIPITOR) 20 MG tablet Take 20 mg by mouth at bedtime.  08/25/17  Yes [provider]  isosorbide mononitrate (IMDUR) 30 MG 24 hr tablet Take 30 mg by mouth daily. 09/22/21  Yes [provider]  lactulose (CHRONULAC) 10 GM/15ML solution Take 30 mLs (20 g total) by mouth 3 (three) times daily. Goal is 2 bowel movements daily. 07/20/21  Yes [name], MD  levothyroxine (SYNTHROID) 75 MCG tablet Take 0.5 tablets (37.5 mcg total) by mouth daily before breakfast. 09/22/21  Yes Rehman, Mechele Dawley, MD  metoprolol tartrate (LOPRESSOR) 25 MG tablet Take 0.5 tablets (12.5 mg total) by mouth 2 (two) times daily. 08/10/21  Yes Chandrasekhar, [name], MD  Omega-3 Fatty Acids (OMEGA 3 500 PO) Take 500 mg by mouth at bedtime.   Yes [provider]  pantoprazole (PROTONIX) 40 MG tablet Take 1 tablet (40 mg total) by mouth 2 (two) times daily before a meal. 09/24/21  Yes Memon, Jolaine Artist, MD  rifaximin (XIFAXAN) 550 MG TABS tablet Take 1 tablet (550 mg total) by mouth 2 (two) times daily. Patient taking differently: Take 550 mg by mouth 2 (two) times daily. Continuously 08/12/20  Yes Rehman, Mechele Dawley, MD  zinc gluconate 50 MG tablet  Take 50 mg by mouth daily.   Yes [provider]    The patient is critically ill with multiple organ systems failure and requires high complexity decision making for assessment and support, frequent evaluation and titration of therapies, application of advanced monitoring technologies and extensive interpretation of multiple databases. Critical Care Time devoted  to patient care services described in this note is 60 min  minutes.   Christinia Gully, MD Pulmonary and [place] [phone]   After 7:00 pm call Elink  [phone]

## 2021-10-05 NOTE — Progress Notes (Signed)
eLink Physician-Brief Progress Note Patient Name: Cody Austin DOB: Sep 26, 1950 MRN: [id number]   Date of Service  10/05/2021  HPI/Events of Note  Notified by nurse trop [phone] which is markedly higher than last trop.  Patient with advanced cirrhosis and had hgb 5.0 this am.  Suspect patient has CAD and in setting of anemia has had ischemia.  He is not a candidate for intervention.  We should focus on maintaining hgb at least above 7.0  eICU Interventions  Echo tomorrow Follow clinically     Intervention Category Intermediate Interventions: Other:  Mauri Brooklyn, P 10/05/2021, 11:15 PM

## 2021-10-05 NOTE — ED Notes (Signed)
Blanket warmer applied at this time

## 2021-10-05 NOTE — Progress Notes (Signed)
71 year old male with [name] cirrhosis, probable hepatocellular carcinoma who presented to [facility] with altered mental status, found to be hypoglycemic and anemic with hemoglobin of 5.  He stated he has been having black tarry stools  Patient was transferred to [facility] due to hemodynamic instability, requiring vasopressor support  During my evaluation patient is awake but lethargic, generalized weak and confused, exam 3 L oxygen via nasal cannula  He has tense abdomen without tenderness Reduced air entry at the bases bilaterally He is in normal sinus rhythm with heart rate in 70s, no murmur appreciated He is awake confused, following commands  Assessment plan: Hemorrhagic shock Acute blood loss anemia on anemia of chronic disease Decompensated [name] cirrhosis Acute hypoxic respiratory failure Probable aspiration pneumonia Hypervolemic hyponatremia Acute kidney injury on CKD stage III a likely due to hepatorenal syndrome Demand cardiac ischemia Lactic acidosis Chronic thrombocytopenia Coagulopathy of liver disease  Patient received 2 units of PRBCs, repeat hemoglobin is 7.1 Continue octreotide and Protonix infusion Continue IV Levophed with map goal 60 and SBP >90 Monitor H&H every 6 hours GI recommended watchful waiting and continue hemodynamic support We will call IR tomorrow for possible redo TIPS which was scheduled for 6/30 Continue oxygen via nasal cannula Continue IV antibiotics We will place Foley for close I/O He is on octreotide and Levophed, will give him albumin considering this could be due to hepatorenal syndrome Trend troponin and lactate Patient received 2 units of FFP, monitor INR  CODE STATUS confirmed with patient's wife and son is DNR Prognosis guarded   Additional critical care time: 37 minutes  Performed by: Jacky Kindle   Critical care time was exclusive of separately billable procedures and treating other patients.   Critical  care was necessary to treat or prevent imminent or life-threatening deterioration.   Critical care was time spent personally by me on the following activities: development of treatment plan with patient and/or surrogate as well as nursing, discussions with consultants, evaluation of patient's response to treatment, examination of patient, obtaining history from patient or surrogate, ordering and performing treatments and interventions, ordering and review of laboratory studies, ordering and review of radiographic studies, pulse oximetry and re-evaluation of patient's condition.   Jacky Kindle, MD [place] [facility] See Amion for pager If no response to pager, please call [phone] until 7pm After 7pm, Please call E-link [phone]

## 2021-10-05 NOTE — ED Notes (Addendum)
Patient bradycardia at 38 at this time. Currently awaiting orders. Adult defib pads applied at this time.

## 2021-10-05 NOTE — Sepsis Progress Note (Signed)
Notified provider of need to order repeat lactic acid and pressor if blood pressor does not improve.

## 2021-10-05 NOTE — Progress Notes (Signed)
Pharmacy Antibiotic Note  Cody Austin is a 71 y.o. male admitted on 10/05/2021 with GIB. Pt with GNR bacteremia.   Spoke with Dr. Tamala Julian, since pt with recent hospitalization, will rebroaden coverage to Cefepime until cultures back (with renal decline, pt didn't actually miss any doses).  Plan: D/c Rocephin Cefepime 2gm IV q24h Will f/u renal function, micro data, and pt's clinical condition  Height: 5' 8"  (172.7 cm) Weight: 80.9 kg (178 lb 5.6 oz) IBW/kg (Calculated) : 68.4  Temp (24hrs), Avg:96.6 F (35.9 C), Min:95.7 F (35.4 C), Max:97.4 F (36.3 C)  Recent Labs  Lab 10/05/21 0822 10/05/21 1044 10/05/21 1620  WBC 9.1  --  9.1  CREATININE 2.66*  --   --   LATICACIDVEN 7.1* 8.5*  --      Estimated Creatinine Clearance: 24.6 mL/min (A) (by C-G formula based on SCr of 2.66 mg/dL (H)).    No Known Allergies  Antimicrobials this admission: cefepime 6/19 >> vancomycin 6/19 >>  Flagyl 6/19>> Ceftriaxone 6/19 x 1  Microbiology results: 6/19 BCx: GNR 6/19 UCx: pending  6/19 MRSA PCR: negative  Thank you for allowing pharmacy to be a part of this patient's care.  Sherlon Handing, PharmD, BCPS Please see amion for complete clinical pharmacist phone list  10/05/2021 10:11 PM

## 2021-10-05 NOTE — Progress Notes (Addendum)
Pharmacy Antibiotic Note  Cody Austin is a 71 y.o. male admitted on 10/05/2021 with  unknown source .  Pharmacy has been consulted for Cefepime and Vancomycin dosing. LA 7.1  Plan: Vancomycin 1565m loading IV, then 12517mIV Q 24 hrs. Goal AUC 400-550. Expected AUC: 496 SCr used: 1.4 Cefepime 2gm IV q12h F/U cxs and clinical progress Monitor V/S, labs and levels as indicated   Height: 5' 8"  (172.7 cm) Weight: 80.9 kg (178 lb 5.6 oz) IBW/kg (Calculated) : 68.4  Temp (24hrs), Avg:95.7 F (35.4 C), Min:95.7 F (35.4 C), Max:95.7 F (35.4 C)  Recent Labs  Lab 09/28/21 1815 10/05/21 0822  WBC 3.6* 9.1    Estimated Creatinine Clearance: 46.8 mL/min (A) (by C-G formula based on SCr of 1.4 mg/dL (H)).    No Known Allergies  Antimicrobials this admission: cefepime 6/19 >> vancomycin 6/19 >>  Flagyl 6/19>>  Microbiology results: 6/19 BCx: pending 6/19 UCx: pending   MRSA PCR:   Thank you for allowing pharmacy to be a part of this patient's care.  LoIsac SarnaBS Pharm D, BCPS Clinical Pharmacist 10/05/2021 8:48 AM

## 2021-10-05 NOTE — ED Notes (Signed)
Patient unable to stand or tolerate lying flat; unable to complete orthostatic VS

## 2021-10-05 NOTE — ED Provider Notes (Addendum)
[facility] EMERGENCY DEPARTMENT Provider Note   CSN: [id number] Arrival date & time: 10/05/21  0809     History  Chief Complaint  Patient presents with   Altered Mental Status    Cody WITUCKI is a 71 y.o. male.  Patient with hx HCC, NASH/cirrhosis, portal hypertension/portal gastropathy/duodenopathy, presents with generalized weakness and hypotension via EMS. Pt poor historian - level 5 caveat. EMS notes glucose low, gave D50. Pt noted to have dark/black stools. Pt denies pain, no chest pain or abd pain. No sob. No fever or chills.   The history is provided by the patient, medical records and the EMS personnel. The history is limited by the condition of the patient.  Altered Mental Status Presenting symptoms: confusion   Associated symptoms: light-headedness and weakness   Associated symptoms: no abdominal pain, no fever, no headaches, no rash and no vomiting        Home Medications Prior to Admission medications   Medication Sig Start Date End Date Taking? Authorizing Provider  atorvastatin (LIPITOR) 20 MG tablet Take 20 mg by mouth at bedtime.  08/25/17  Yes [provider]  isosorbide mononitrate (IMDUR) 30 MG 24 hr tablet Take 30 mg by mouth daily. 09/22/21  Yes [provider]  lactulose (CHRONULAC) 10 GM/15ML solution Take 30 mLs (20 g total) by mouth 3 (three) times daily. Goal is 2 bowel movements daily. 07/20/21  Yes [name], MD  levothyroxine (SYNTHROID) 75 MCG tablet Take 0.5 tablets (37.5 mcg total) by mouth daily before breakfast. 09/22/21  Yes Rehman, Mechele Dawley, MD  metoprolol tartrate (LOPRESSOR) 25 MG tablet Take 0.5 tablets (12.5 mg total) by mouth 2 (two) times daily. 08/10/21  Yes Chandrasekhar, [name], MD  Omega-3 Fatty Acids (OMEGA 3 500 PO) Take 500 mg by mouth at bedtime.   Yes [provider]  pantoprazole (PROTONIX) 40 MG tablet Take 1 tablet (40 mg total) by mouth 2 (two) times daily before a meal. 09/24/21  Yes  Memon, Jolaine Artist, MD  rifaximin (XIFAXAN) 550 MG TABS tablet Take 1 tablet (550 mg total) by mouth 2 (two) times daily. Patient taking differently: Take 550 mg by mouth 2 (two) times daily. Continuously 08/12/20  Yes Rehman, Mechele Dawley, MD  zinc gluconate 50 MG tablet Take 50 mg by mouth daily.   Yes [provider]      Allergies    Patient has no known allergies.    Review of Systems   Review of Systems  Constitutional:  Negative for fever.  HENT:  Negative for sore throat.   Eyes:  Negative for redness.  Respiratory:  Negative for shortness of breath.   Cardiovascular:  Negative for chest pain.  Gastrointestinal:  Negative for abdominal pain and vomiting.  Genitourinary:  Negative for flank pain.  Musculoskeletal:  Negative for back pain and neck pain.  Skin:  Negative for rash.  Neurological:  Positive for weakness and light-headedness. Negative for headaches.  Hematological:  Does not bruise/bleed easily.  Psychiatric/Behavioral:  Positive for confusion.     Physical Exam Updated Vital Signs BP (!) 79/48   Pulse 86   Temp (!) 95.7 F (35.4 C) (Rectal)   Resp (!) 23   Ht 1.727 m (5' 8" )   Wt 80.9 kg   SpO2 100%   BMI 27.12 kg/m  Physical Exam Vitals and nursing note reviewed.  Constitutional:      Appearance: Normal appearance. He is well-developed.  HENT:     Head: Atraumatic.  Nose: Nose normal.     Mouth/Throat:     Mouth: Mucous membranes are moist.     Pharynx: Oropharynx is clear.  Eyes:     General: No scleral icterus.    Pupils: Pupils are equal, round, and reactive to light.     Comments: Pale appearing.   Neck:     Trachea: No tracheal deviation.  Cardiovascular:     Rate and Rhythm: Normal rate and regular rhythm.     Heart sounds: Normal heart sounds. No murmur heard.    No friction rub. No gallop.  Pulmonary:     Effort: Pulmonary effort is normal. No accessory muscle usage or respiratory distress.     Breath sounds: Normal breath  sounds.  Abdominal:     General: Bowel sounds are normal.     Palpations: Abdomen is soft.     Tenderness: There is no abdominal tenderness. There is no guarding.     Comments: +ascites.   Genitourinary:    Comments: No cva tenderness. Musculoskeletal:        General: No swelling or tenderness.     Cervical back: Normal range of motion and neck supple. No rigidity.  Skin:    General: Skin is warm and dry.     Coloration: Skin is pale.     Findings: No rash.  Neurological:     Mental Status: He is alert.     Comments: Alert, speech clear. Mildly confused/slow to respond. Motor/sens grossly intact bil.   Psychiatric:        Mood and Affect: Mood normal.     ED Results / Procedures / Treatments   Labs (all labs ordered are listed, but only abnormal results are displayed) Results for orders placed or performed during the hospital encounter of 10/05/21  Resp Panel by RT-PCR (Flu A&B, Covid) Anterior Nasal Swab   Specimen: Anterior Nasal Swab  Result Value Ref Range   SARS Coronavirus 2 by RT PCR NEGATIVE NEGATIVE   Influenza A by PCR NEGATIVE NEGATIVE   Influenza B by PCR NEGATIVE NEGATIVE  Blood Culture (routine x 2)   Specimen: BLOOD  Result Value Ref Range   Specimen Description BLOOD RIGHT ANTECUBITAL    Special Requests      BOTTLES DRAWN AEROBIC AND ANAEROBIC Blood Culture results may not be optimal due to an excessive volume of blood received in culture bottles Performed at [facility], [address] [place], [place] [address]    Culture PENDING    Report Status PENDING   Blood Culture (routine x 2)   Specimen: BLOOD  Result Value Ref Range   Specimen Description BLOOD BLOOD LEFT FOREARM    Special Requests      BOTTLES DRAWN AEROBIC AND ANAEROBIC Blood Culture results may not be optimal due to an inadequate volume of blood received in culture bottles Performed at [facility], [address] [place], [place] [address]    Culture PENDING    Report Status  PENDING   Lactic acid, plasma  Result Value Ref Range   Lactic Acid, Venous 7.1 (HH) 0.5 - 1.9 mmol/L  Lactic acid, plasma  Result Value Ref Range   Lactic Acid, Venous 8.5 (HH) 0.5 - 1.9 mmol/L  Comprehensive metabolic panel  Result Value Ref Range   Sodium 129 (L) 135 - 145 mmol/L   Potassium 4.4 3.5 - 5.1 mmol/L   Chloride 103 98 - 111 mmol/L   CO2 15 (L) 22 - 32 mmol/L   Glucose,  Bld 148 (H) 70 - 99 mg/dL   BUN 81 (H) 8 - 23 mg/dL   Creatinine, Ser 2.66 (H) 0.61 - 1.24 mg/dL   Calcium 8.6 (L) 8.9 - 10.3 mg/dL   Total Protein <3.0 (L) 6.5 - 8.1 g/dL   Albumin <1.5 (L) 3.5 - 5.0 g/dL   AST 150 (H) 15 - 41 U/L   ALT 82 (H) 0 - 44 U/L   Alkaline Phosphatase 65 38 - 126 U/L   Total Bilirubin 4.7 (H) 0.3 - 1.2 mg/dL   GFR, Estimated 25 (L) >60 mL/min   Anion gap 11 5 - 15  CBC with Differential  Result Value Ref Range   WBC 9.1 4.0 - 10.5 K/uL   RBC 1.40 (L) 4.22 - 5.81 MIL/uL   Hemoglobin 5.0 (LL) 13.0 - 17.0 g/dL   HCT 16.0 (L) 39.0 - 52.0 %   MCV 114.3 (H) 80.0 - 100.0 fL   MCH 35.7 (H) 26.0 - 34.0 pg   MCHC 31.3 30.0 - 36.0 g/dL   RDW 23.7 (H) 11.5 - 15.5 %   Platelets 80 (L) 150 - 400 K/uL   nRBC 0.0 0.0 - 0.2 %   Neutrophils Relative % 79 %   Neutro Abs 8.3 (H) 1.7 - 7.7 K/uL   Band Neutrophils 12 %   Lymphocytes Relative 6 %   Lymphs Abs 0.5 (L) 0.7 - 4.0 K/uL   Monocytes Relative 3 %   Monocytes Absolute 0.3 0.1 - 1.0 K/uL   Eosinophils Relative 0 %   Eosinophils Absolute 0.0 0.0 - 0.5 K/uL   Basophils Relative 0 %   Basophils Absolute 0.0 0.0 - 0.1 K/uL   RBC Morphology MACROCYTES    Smear Review PLATELETS APPEAR DECREASED    Abs Immature Granulocytes 0.00 0.00 - 0.07 K/uL   Dimorphism PRESENT    Polychromasia PRESENT   Protime-INR  Result Value Ref Range   Prothrombin Time 43.8 (H) 11.4 - 15.2 seconds   INR 4.7 (HH) 0.8 - 1.2  APTT  Result Value Ref Range   aPTT 65 (H) 24 - 36 seconds  Ammonia  Result Value Ref Range   Ammonia 41 (H) 9 - 35 umol/L   POC occult blood, ED Provider will collect  Result Value Ref Range   Fecal Occult Bld POSITIVE (A) NEGATIVE  CBG monitoring, ED  Result Value Ref Range   Glucose-Capillary 91 70 - 99 mg/dL  CBG monitoring, ED  Result Value Ref Range   Glucose-Capillary 28 (LL) 70 - 99 mg/dL   Comment 1 Notify RN    Comment 2 Document in Chart   Type and screen  Result Value Ref Range   ABO/RH(D) A POS    Antibody Screen NEG    Sample Expiration 10/08/2021,2359    Unit Number [id number]    Blood Component Type RED CELLS,LR    Unit division 00    Status of Unit ISSUED    Unit tag comment EMERGENCY RELEASE    Transfusion Status OK TO TRANSFUSE    Crossmatch Result PENDING    Unit Number [id number]    Blood Component Type RED CELLS,LR    Unit division 00    Status of Unit ALLOCATED    Transfusion Status OK TO TRANSFUSE    Crossmatch Result      Compatible Performed at [facility], [address], [place], [place] [address]    Unit Number [id number]    Blood Component Type RED CELLS,LR    Unit  division 00    Status of Unit ALLOCATED    Transfusion Status OK TO TRANSFUSE    Crossmatch Result Compatible   Prepare RBC (crossmatch)  Result Value Ref Range   Order Confirmation      ORDER PROCESSED BY BLOOD BANK Performed at [facility], [address], [place], [place] [address]   Prepare RBC (crossmatch)  Result Value Ref Range   Order Confirmation      ORDER PROCESSED BY BLOOD BANK Performed at [facility], [address], [place], [place] [address]   [facility]  Result Value Ref Range   ISSUE DATE / TIME [id number]    Blood Product Unit Number [id number]    Unit Type and Rh 9500    Blood Product Expiration Date 076226333545    Blood Product Unit Number [id number]    PRODUCT CODE E0382V00    Unit Type and Rh 6200    Blood Product Expiration Date 768115726203    Blood Product Unit Number [id number]    PRODUCT CODE [id number]    Unit Type and Rh 6200    Blood  Product Expiration Date 248250037048   Troponin I (High Sensitivity)  Result Value Ref Range   Troponin I (High Sensitivity) 231 (HH) <18 ng/L  Troponin I (High Sensitivity)  Result Value Ref Range   Troponin I (High Sensitivity) 557 (HH) <18 ng/L   DG Chest Port 1 View  Result Date: 10/05/2021 CLINICAL DATA:  71 year old male with possible sepsis.  NSTEMI. EXAM: PORTABLE CHEST 1 VIEW COMPARISON:  Chest radiographs 07/16/2021. FINDINGS: Portable AP semi upright view at 0844 hours. Mildly rotated to the right. Low lung volumes stable since March. Mediastinal contours remain within normal limits. Moderate new veiling opacity in the right lung. No pneumothorax. Pulmonary vascularity seems stable, no definite edema. No definite left pleural effusion. No air bronchograms identified. Visualized tracheal air column is within normal limits. No acute osseous abnormality identified. Negative visible bowel gas pattern. IMPRESSION: Low lung volumes as seen in March with moderate new right lung veiling opacity favored to be new pleural effusion. No overt pulmonary edema. PA and lateral views of the chest would be helpful when feasible. Electronically Signed   By: Genevie Ann M.D.   On: 10/05/2021 08:54   US Paracentesis  Result Date: 09/30/2021 INDICATION: NASH cirrhosis, recurrent ascites EXAM: ULTRASOUND GUIDED RLQ PARACENTESIS MEDICATIONS: None. COMPLICATIONS: None immediate. PROCEDURE: Informed written consent was obtained from the patient after a discussion of the risks, benefits and alternatives to treatment. A timeout was performed prior to the initiation of the procedure. Initial ultrasound scanning demonstrates a large amount of ascites within the right lower abdominal quadrant. The right lower abdomen was prepped and draped in the usual sterile fashion. 1% lidocaine was used for local anesthesia. Following this, a 19 gauge, 7-cm, Yueh catheter was introduced. An ultrasound image was saved for documentation  purposes. The paracentesis was performed. The catheter was removed and a dressing was applied. The patient tolerated the procedure well without immediate post procedural complication. FINDINGS: A total of approximately 4.5L of yellow ascitic fluid was removed. Samples were sent to the laboratory as requested by the clinical team. IMPRESSION: Successful ultrasound-guided paracentesis yielding 4.5 liters of peritoneal fluid. PLAN: The patient has required >/=2 paracenteses in a 30 day period and a formal evaluation by the [facility] has been arranged. Electronically Signed   By: Marijo Conception M.D.   On: 09/30/2021 10:41   US Paracentesis  Result Date: 09/21/2021 INDICATION: Nonalcoholic cirrhosis EXAM: ULTRASOUND GUIDED DIAGNOSTIC AND THERAPEUTIC PARACENTESIS MEDICATIONS: None. COMPLICATIONS: None immediate. PROCEDURE: Informed written consent was obtained from the patient after a discussion of the risks, benefits and alternatives to treatment. A timeout was performed prior to the initiation of the procedure. Initial ultrasound scanning demonstrates a large amount of ascites within the right lower abdominal quadrant. The right lower abdomen was prepped and draped in the usual sterile fashion. 1% lidocaine was used for local anesthesia. Following this, a 5 [place] Yueh catheter was introduced. An ultrasound image was saved for documentation purposes. The paracentesis was performed. The catheter was removed and a dressing was applied. The patient tolerated the procedure well without immediate post procedural complication. Patient received post-procedure intravenous albumin; see nursing notes for details. FINDINGS: A total of approximately 3.7 L of amber colored ascitic fluid was removed. Samples were sent to the laboratory as requested by the clinical team. IMPRESSION: Successful ultrasound-guided paracentesis yielding 3.7 liters of peritoneal fluid. Electronically  Signed   By: Lavonia Dana M.D.   On: 09/21/2021 11:20   US Paracentesis  Result Date: 09/09/2021 INDICATION: Patient with a history of cirrhosis and possible HCC with recurrent ascites. Interventional Radiology asked to perform a diagnostic and therapeutic paracentesis. EXAM: ULTRASOUND GUIDED PARACENTESIS MEDICATIONS: 1% lidocaine 10 ml COMPLICATIONS: None immediate. PROCEDURE: Informed written consent was obtained from the patient after a discussion of the risks, benefits and alternatives to treatment. A timeout was performed prior to the initiation of the procedure. Initial ultrasound scanning demonstrates a large amount of ascites within the right lower abdominal quadrant. The right lower abdomen was prepped and draped in the usual sterile fashion. 1% lidocaine was used for local anesthesia. Following this, a 19 gauge, 7-cm, Yueh catheter was introduced. An ultrasound image was saved for documentation purposes. The paracentesis was performed. The catheter was removed and a dressing was applied. The patient tolerated the procedure well without immediate post procedural complication. FINDINGS: A total of approximately 1.6 L of dark yellow/amber-colored fluid was removed. Samples were sent to the laboratory as requested by the clinical team. IMPRESSION: Successful ultrasound-guided paracentesis yielding 1.6 liters of peritoneal fluid. Read by: Soyla Dryer, NP PLAN: The patient has required >/=2 paracenteses in a 30 day period and a formal evaluation by the [facility] has been arranged. Electronically Signed   By: Lavonia Dana M.D.   On: 09/09/2021 11:24   ]   EKG EKG Interpretation  Date/Time:  Monday October 05 2021 08:17:51 EDT Ventricular Rate:  81 PR Interval:    QRS Duration: 97 QT Interval:  425 QTC Calculation: 494 R Axis:   50 Text Interpretation: Sinus rhythm with 1st degree A-V block Ventricular premature complex Low voltage, extremity  and precordial leads Borderline prolonged QT interval Non-specific ST-t changes Confirmed by Lajean Saver [phone]) on 10/05/2021 8:21:06 AM  Radiology DG Chest Port 1 View  Result Date: 10/05/2021 CLINICAL DATA:  71 year old male with possible sepsis.  NSTEMI. EXAM: PORTABLE CHEST 1 VIEW COMPARISON:  Chest radiographs 07/16/2021. FINDINGS: Portable AP semi upright view at 0844 hours. Mildly rotated to the right. Low lung volumes stable since March. Mediastinal contours remain within normal limits. Moderate new veiling opacity in the right lung. No pneumothorax. Pulmonary vascularity seems stable, no definite edema. No definite left pleural effusion. No air bronchograms identified. Visualized tracheal air column is within normal limits. No acute osseous abnormality identified. Negative visible bowel gas pattern. IMPRESSION: Low lung volumes as  seen in March with moderate new right lung veiling opacity favored to be new pleural effusion. No overt pulmonary edema. PA and lateral views of the chest would be helpful when feasible. Electronically Signed   By: Genevie Ann M.D.   On: 10/05/2021 08:54    Procedures Procedures    Medications Ordered in ED Medications  lactated ringers infusion ( Intravenous New Bag/Given 10/05/21 1156)  ceFEPIme (MAXIPIME) 2 g in sodium chloride 0.9 % 100 mL IVPB (2 g Intravenous New Bag/Given 10/05/21 1200)  metroNIDAZOLE (FLAGYL) IVPB 500 mg (has no administration in time range)  dextrose 5 %-0.9 % sodium chloride infusion ( Intravenous New Bag/Given 10/05/21 0832)  0.9 %  sodium chloride infusion (Manually program via Guardrails IV Fluids) ( Intravenous Not Given 10/05/21 0943)  vancomycin (VANCOREADY) IVPB 500 mg/100 mL (0 mg Intravenous Stopped 10/05/21 1043)    Followed by  vancomycin (VANCOREADY) IVPB 1250 mg/250 mL (has no administration in time range)  ceFEPIme (MAXIPIME) 2 g in sodium chloride 0.9 % 100 mL IVPB (has no administration in time range)  pantoprozole  (PROTONIX) 80 mg /NS 100 mL infusion (8 mg/hr Intravenous New Bag/Given 10/05/21 0912)  0.9 %  sodium chloride infusion (Manually program via Guardrails IV Fluids) (has no administration in time range)  dextrose 50 % solution 25 g (25 g Intravenous Given 10/05/21 0820)  0.9 %  sodium chloride infusion (0 mLs Intravenous Stopped 10/05/21 0901)  lactated ringers bolus 1,000 mL (0 mLs Intravenous Stopped 10/05/21 0942)    And  lactated ringers bolus 1,000 mL (0 mLs Intravenous Stopped 10/05/21 0909)    And  lactated ringers bolus 500 mL (0 mLs Intravenous Stopped 10/05/21 1043)  vancomycin (VANCOCIN) IVPB 1000 mg/200 mL premix (0 mg Intravenous Stopped 10/05/21 0935)  pantoprazole (PROTONIX) 80 mg /NS 100 mL IVPB (0 mg Intravenous Stopped 10/05/21 1003)  phytonadione (VITAMIN K) 10 mg in dextrose 5 % 50 mL IVPB (0 mg Intravenous Stopped 10/05/21 1155)  0.9 %  sodium chloride infusion (Manually program via Guardrails IV Fluids) ( Intravenous New Bag/Given 10/05/21 1038)    ED Course/ Medical Decision Making/ A&P                           Medical Decision Making Problems Addressed: Acute GI bleeding: acute illness or injury with systemic symptoms that poses a threat to life or bodily functions AKI (acute kidney injury) ([place]): acute illness or injury with systemic symptoms that poses a threat to life or bodily functions 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 ([place]): acute illness or injury with systemic symptoms that poses a threat to life or bodily functions Cancer, hepatocellular ([place]): chronic illness or injury that poses a threat to life or bodily functions Elevated troponin: acute illness or injury that poses a threat to life or bodily functions Hypotension due to hypovolemia: acute illness or injury with systemic symptoms that poses a threat to life or bodily functions Liver cirrhosis secondary to NASH [facility]): chronic illness or injury with exacerbation, progression, or side effects of  treatment that poses a threat to life or bodily functions  Amount and/or Complexity of Data Reviewed Independent Historian: EMS    Details: family, hx External Data Reviewed: labs, radiology and notes. Labs: ordered. Decision-making details documented in ED Course. Radiology: ordered and independent interpretation performed. Decision-making details documented in ED Course. ECG/medicine tests: ordered and independent interpretation performed. Decision-making details documented in ED Course. Discussion of management or  test interpretation with external provider(s): GI and critical care MD - discussed pt, labs.   Risk Prescription drug management. Decision regarding hospitalization. Decision not to resuscitate or to de-escalate care because of poor prognosis.  Critical Care Total time providing critical care: 150 minutes   Iv ns. Continuous pulse ox and cardiac monitoring. Labs ordered/sent. Imaging ordered.   Reviewed nursing notes and prior charts for additional history. External reports reviewed. Recent EGD - blood in stomach/duodedum, portal duodenopathy/gastropathy, hemostatic spray used. Additional history from: EMS.   Prbc transfusion.   Inr high, FFP transfusion. Vit k iv. Octreotide bolus/gtt. Protonix bolus/gtt.   D5NS gtt.   LR boluses. Cultures sent. 2nd iv line.   PRBC ordered.   Cardiac monitor: sinus rhythm, rate 78.  Labs reviewed/interpreted by me - hgb 5.   Xrays reviewed/interpreted by me - no def pna.  GI consulted.  Discussed pt with Dr Abbey Chatters, agrees w protonix, octreotide, transfusions, etc, - he will see.   PCCM consulted - discussed w Dr Melvyn Novas - will see in ED, and decide on admit here vs transfer to [facility].   On prior admission, pt noted to have 'dnr' order/status. Discussed w pt - pt confirms his wishes are no CPR, no intubation/vent.   Discussed w family, confirmed dnr plan but full scope of medical treatment otherwise.   Additional labs  reviewed/interpreted by me - trop elev. Pt denies chest pain/discomfort, ?demand/hypotension related.   Critical care to admit.  CRITICAL CARE IP:[id number], multiorgan failure, cirrhosis/nash with coagulopathy, acute gi bleeding, aki/?hepatorenal syndrome, elev trop, hep encephalopathy. Performed by: Mirna Mires Total critical care time: 180 minutes Critical care time was exclusive of separately billable procedures and treating other patients. Critical care was necessary to treat or prevent imminent or life-threatening deterioration. Critical care was time spent personally by me on the following activities: development of treatment plan with patient and/or surrogate as well as nursing, discussions with consultants, evaluation of patient's response to treatment, examination of patient, obtaining history from patient or surrogate, ordering and performing treatments and interventions, ordering and review of laboratory studies, ordering and review of radiographic studies, pulse oximetry and re-evaluation of patient's condition.  Dr Melvyn Novas saw pt in ED - he indicates if hospitalist amenable could admit to [facility], if not, transfer to PCCM at [facility].  Hospitalist consulted - discussed pt - he indicates not able to admit to [facility], indicates must go to P[facility]M at [facility].  [place] consulted for transfer/admit.   Discussed pt with Dr Loanne Drilling who accepts in transfer/admit to [facility].          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

## 2021-10-05 NOTE — ED Notes (Signed)
Critical Lab: Lactic Acid 7.1.  Provider notified.

## 2021-10-05 NOTE — Progress Notes (Signed)
Placed patient on home CPAP set at 14 cm and oxygen set at 4lpm.

## 2021-10-05 NOTE — ED Notes (Signed)
Patient had bowel movement at this time

## 2021-10-05 NOTE — ED Notes (Signed)
Patient has increasing edema to left arm.

## 2021-10-05 NOTE — ED Notes (Signed)
Patient given oral swabs at this time.

## 2021-10-05 NOTE — ED Notes (Signed)
Per Dr. Ashok Cordia, emergency release blood at this time. Blood bank notified.

## 2021-10-05 NOTE — ED Notes (Signed)
Dr. Ashok Cordia Made aware of patient status. Currently awaiting orders.

## 2021-10-05 NOTE — Progress Notes (Signed)
An USGPIV (ultrasound guided PIV) has been placed for short-term vasopressor infusion. A correctly placed ivWatch must be used when administering Vasopressors. Should this treatment be needed beyond 72 hours, central line access should be obtained.  It will be the responsibility of the bedside nurse to follow best practice to prevent extravasations.   ?

## 2021-10-05 NOTE — ED Notes (Signed)
Dr. Ashok Cordia aware of BP. Patient awake to voice and responds to questions asked.

## 2021-10-05 NOTE — Consult Note (Signed)
Gastroenterology Consult   Referring Provider: No ref. provider found Primary Care Physician:  Rogene Houston, MD Primary Gastroenterologist:  Dr. Laural Golden (previously)  Patient ID: Cody Austin; [id number]; 1950-10-25   Admit date: 10/05/2021  LOS: 0 days   Date of Consultation: 10/05/2021  Reason for Consultation:  symptomatic anemia/GI bleed, cirrhosis  History of Present Illness   Cody Austin is a 71 y.o. year old male with significant medical history of [place] cirrhosis, CAD, hypothyroidism, PAD, OSA, anemia who presents to the ED via EMS with altered mental status, hypoglycemia, labs with hemoglobin of 5.  GI consulted for further evaluation.  ED course: Hgb 5 on arrival, 2 units PRBCs ordered, Plts 80, albumin <1.5, AST 150, ALT 82, T.Bili 4.7, BUN 81, Cr 2.66, Na 129, INR 4.7, PT 43.8, Lactate 7.1, CBG 54 and 28 in triage/EMS.  Dextrose given.  Per EMS he was hypotensive on scene.  Reportedly alert to voice but disoriented and weak.  He also presents with bradycardia with lowest heart rate of 38.  Patient apparently reported dark/black stools.  Started on sepsis protocol. Stool heme positive.     Consult: Patient recently seen in the ED for blood transfusion on 6/12 at the recommendation of Dr. Laural Golden for decreasing Hgb.  He subsequently had paracentesis on 6/14 with Hgb recheck that was stable and no evidence of SBP. INR stable at 2.4 on 6/14.   Last EGD 09/23/21 with Grade 1 varices in the lower third of the esophagus, moderate portal hypertensive gastropathy with active oozing from antral and prepyloric mucosa s/p hemospray x4.   Majority of history provided by patient's spouse [name].  Patient reports that he has been having darker stools for the last few days.  States he woke up this morning was feeling very weak and will bit more short of breath.  He states his swelling has not increased more than his baseline.  He states he had a paracentesis on Wednesday last week.  He  states he is not in any pain.  He is not nauseous and has not had any hematemesis.  Denies any BRBPR.  Patient's wife reports that he has also had darker and watery stools over the last few days.  He has been taking his Xifaxan twice daily and his lactulose 3 times daily as instructed.  He has had weakness but not any disorientation.  Denies any falls.  States appetite has been well.  States he was able to celebrate Father's Day yesterday with his family.  Currently patient reports he is feeling little bit better than when he presented to the ED.  He states that shortness of breath is better and he has been little bit more alert.  His wife has noted a little bit of a lack of appetite over this weekend.    Past Medical History:  Diagnosis Date   AKI (acute kidney injury) ([place]) 07/17/2021   Arthritis    Cirrhosis, non-alcoholic ([place])    Coronary artery disease    Fatty liver    Heart murmur    History of kidney stones    Hypothyroidism 07/17/2021   NASH Liver cirrhosis secondary to NASH (nonalcoholic steatohepatitis) 06/01/2019   NSTEMI (non-ST elevated myocardial infarction) ([place])    Other pancytopenia ([place]) 11/19/2014   Peripheral arterial disease ([place]) 05/28/2019   Sleep apnea    uses CPAP    Past Surgical History:  Procedure Laterality Date   CATARACT EXTRACTION W/PHACO Right 08/12/2014   Procedure: CATARACT EXTRACTION  PHACO AND INTRAOCULAR LENS PLACEMENT (IOC);  Surgeon: [name], MD;  Location: [facility];  Service: Ophthalmology;  Laterality: Right;  CDE 9.32   CATARACT EXTRACTION W/PHACO Left 08/26/2014   Procedure: CATARACT EXTRACTION PHACO AND INTRAOCULAR LENS PLACEMENT (IOC);  Surgeon: [name], MD;  Location: [facility];  Service: Ophthalmology;  Laterality: Left;  CDE:  9.49   COLONOSCOPY N/A 12/13/2014   Procedure: COLONOSCOPY;  Surgeon: Rogene Houston, MD;  Location: [facility];  Service: Endoscopy;  Laterality: N/A;  100   ESOPHAGEAL BANDING N/A 08/31/2019   Procedure: ESOPHAGEAL  BANDING;  Surgeon: Rogene Houston, MD;  Location: [facility];  Service: Endoscopy;  Laterality: N/A;   ESOPHAGOGASTRODUODENOSCOPY N/A 12/13/2014   Procedure: ESOPHAGOGASTRODUODENOSCOPY (EGD);  Surgeon: Rogene Houston, MD;  [facility]: [facility];  Service: Endoscopy;  Laterality: N/A;   ESOPHAGOGASTRODUODENOSCOPY (EGD) WITH PROPOFOL N/A 08/31/2019   Procedure: ESOPHAGOGASTRODUODENOSCOPY (EGD) WITH PROPOFOL;  Surgeon: Rogene Houston, MD;  [facility];  Service: Endoscopy;  Laterality: N/A;  1200   ESOPHAGOGASTRODUODENOSCOPY (EGD) WITH PROPOFOL N/A 10/01/2020   Procedure: ESOPHAGOGASTRODUODENOSCOPY (EGD) WITH PROPOFOL;  Surgeon: Rogene Houston, MD;  [facility];  Service: Endoscopy;  Laterality: N/A;  10:00   ESOPHAGOGASTRODUODENOSCOPY (EGD) WITH PROPOFOL N/A 09/23/2021   Procedure: ESOPHAGOGASTRODUODENOSCOPY (EGD) WITH PROPOFOL;  Surgeon: Rogene Houston, MD;  [facility];  Service: Endoscopy;  Laterality: N/A;   HERNIA REPAIR Right    Inguinal hernia   TONSILLECTOMY      Prior to Admission medications   Medication Sig Start Date End Date Taking? Authorizing Provider  atorvastatin (LIPITOR) 20 MG tablet Take 20 mg by mouth at bedtime.  08/25/17   [provider]  furosemide (LASIX) 20 MG tablet Take 20 mg by mouth daily.    [provider]  isosorbide mononitrate (IMDUR) 30 MG 24 hr tablet Take 30 mg by mouth daily. 09/22/21   [provider]  lactulose (CHRONULAC) 10 GM/15ML solution Take 30 mLs (20 g total) by mouth 3 (three) times daily. Goal is 2 bowel movements daily. 07/20/21   [name], MD  levothyroxine (SYNTHROID) 75 MCG tablet Take 0.5 tablets (37.5 mcg total) by mouth daily before breakfast. 09/22/21   Rehman, Mechele Dawley, MD  metoprolol tartrate (LOPRESSOR) 25 MG tablet Take 0.5 tablets (12.5 mg total) by mouth 2 (two) times daily. 08/10/21   Chandrasekhar, Terisa Starr, MD  Omega-3 Fatty Acids (OMEGA 3 500 PO)  Take 500 mg by mouth at bedtime.    [provider]  pantoprazole (PROTONIX) 40 MG tablet Take 1 tablet (40 mg total) by mouth 2 (two) times daily before a meal. 09/24/21   [name], MD  rifaximin (XIFAXAN) 550 MG TABS tablet Take 1 tablet (550 mg total) by mouth 2 (two) times daily. Patient taking differently: Take 550 mg by mouth 2 (two) times daily. Continuously 08/12/20   Rogene Houston, MD  zinc gluconate 50 MG tablet Take 50 mg by mouth daily.    [provider]    Current Facility-Administered Medications  Medication Dose Route Frequency Provider Last Rate Last Admin   0.9 %  sodium chloride infusion (Manually program via Guardrails IV Fluids)   Intravenous Once Lajean Saver, MD       0.9 %  sodium chloride infusion (Manually program via Guardrails IV Fluids)   Intravenous Once Lajean Saver, MD       ceFEPIme (MAXIPIME) 2 g in sodium chloride 0.9 %  100 mL IVPB  2 g Intravenous Once Lajean Saver, MD       ceFEPIme (MAXIPIME) 2 g in sodium chloride 0.9 % 100 mL IVPB  2 g Intravenous Q12H Lajean Saver, MD       dextrose 5 %-0.9 % sodium chloride infusion   Intravenous Continuous Lajean Saver, MD 200 mL/hr at 10/05/21 0832 New Bag at 10/05/21 4982   lactated ringers bolus 500 mL  500 mL Intravenous Once Lajean Saver, MD       lactated ringers infusion   Intravenous Continuous Lajean Saver, MD       metroNIDAZOLE (FLAGYL) IVPB 500 mg  500 mg Intravenous Once Lajean Saver, MD       pantoprozole (PROTONIX) 80 mg /NS 100 mL infusion  8 mg/hr Intravenous Continuous Lajean Saver, MD 10 mL/hr at 10/05/21 0912 8 mg/hr at 10/05/21 0912   phytonadione (VITAMIN K) 10 mg in dextrose 5 % 50 mL IVPB  10 mg Intravenous Once Lajean Saver, MD       vancomycin (VANCOCIN) IVPB 1000 mg/200 mL premix  1,000 mg Intravenous Once Lajean Saver, MD 200 mL/hr at 10/05/21 0834 1,000 mg at 10/05/21 0834   vancomycin (VANCOREADY) IVPB 500 mg/100 mL  500 mg Intravenous Once Lajean Saver,  MD       Followed by   Derrill Memo ON 10/06/2021] vancomycin (VANCOREADY) IVPB 1250 mg/250 mL  1,250 mg Intravenous Q24H Lajean Saver, MD       Current Outpatient Medications  Medication Sig Dispense Refill   atorvastatin (LIPITOR) 20 MG tablet Take 20 mg by mouth at bedtime.   0   furosemide (LASIX) 20 MG tablet Take 20 mg by mouth daily.     isosorbide mononitrate (IMDUR) 30 MG 24 hr tablet Take 30 mg by mouth daily.     lactulose (CHRONULAC) 10 GM/15ML solution Take 30 mLs (20 g total) by mouth 3 (three) times daily. Goal is 2 bowel movements daily. 2700 mL 5   levothyroxine (SYNTHROID) 75 MCG tablet Take 0.5 tablets (37.5 mcg total) by mouth daily before breakfast. 30 tablet 0   metoprolol tartrate (LOPRESSOR) 25 MG tablet Take 0.5 tablets (12.5 mg total) by mouth 2 (two) times daily. 90 tablet 3   Omega-3 Fatty Acids (OMEGA 3 500 PO) Take 500 mg by mouth at bedtime.     pantoprazole (PROTONIX) 40 MG tablet Take 1 tablet (40 mg total) by mouth 2 (two) times daily before a meal. 60 tablet 5   rifaximin (XIFAXAN) 550 MG TABS tablet Take 1 tablet (550 mg total) by mouth 2 (two) times daily. (Patient taking differently: Take 550 mg by mouth 2 (two) times daily. Continuously) 60 tablet 11   zinc gluconate 50 MG tablet Take 50 mg by mouth daily.      Allergies as of 10/05/2021   (No Known Allergies)    Family History  Problem Relation Age of Onset   Cancer Father    Aneurysm Father     Social History   Socioeconomic History   Marital status: Married    Spouse name: Not on file   Number of children: Not on file   Years of education: Not on file   Highest education level: Not on file  Occupational History   Not on file  Tobacco Use   Smoking status: Never    Passive exposure: Never   Smokeless tobacco: Never  Vaping Use   Vaping Use: Never used  Substance and Sexual Activity   Alcohol use:  Not Currently   Drug use: No   Sexual activity: Yes  Other Topics Concern   Not on file   Social History Narrative   Not on file   Social Determinants of Health   Financial Resource Strain: Not on file  Food Insecurity: Not on file  Transportation Needs: Not on file  Physical Activity: Not on file  Stress: Not on file  Social Connections: Not on file  Intimate Partner Violence: Not on file     Review of Systems   Gen: Denies any fever, chills, loss of appetite, change in weight or weight loss CV: Denies chest pain, heart palpitations, syncope, edema  Resp: Denies shortness of breath with rest, cough, wheezing, coughing up blood, and pleurisy. GI: See HPI GU : Denies urinary burning, blood in urine, urinary frequency, and urinary incontinence. MS: Denies joint pain, limitation of movement, swelling, cramps, and atrophy.  Derm: Denies rash, itching, dry skin, hives. Psych: Denies depression, anxiety, memory loss, hallucinations, and confusion. Heme: Denies bruising or bleeding Neuro:  Denies any headaches, dizziness, paresthesias, shaking  Physical Exam   Vital Signs in last 24 hours: Temp:  [95.7 F (35.4 C)] 95.7 F (35.4 C) (06/19 0832) Pulse Rate:  [76-83] 80 (06/19 0915) Resp:  [12-28] 28 (06/19 0915) BP: (58-103)/(29-91) 76/50 (06/19 0915) SpO2:  [99 %-100 %] 100 % (06/19 0915) Weight:  [80.9 kg] 80.9 kg (06/19 0814)    General:   Alert,  Well-developed, well-nourished, pleasant and cooperative in NAD Head:  Normocephalic and atraumatic. Eyes: No icterus.   conjunctiva pink. Ears:  Normal auditory acuity. Mouth: Dry mucous membranes. Lungs:  Clear but diminished in the bases, shallow breaths.  Symmetrical chest rise.  Increased work of breathing slightly tachypneic. Heart:  Regular rate and rhythm; no murmurs, clicks, rubs,  or gallops. Abdomen:  Soft, distended, nontender.  Anasarca.  No masses, hepatosplenomegaly or hernias noted. Normal bowel sounds, without guarding, and without rebound.   Rectal: Deferred Extremities: +2 edema to bilateral  lower extremities Neurologic: Drowsy and  oriented x4.  No asterixis Skin: Diffuse bruising, mild jaundice tent Psych: Drowsy but cooperative  Intake/Output from previous day: No intake/output data recorded. Intake/Output this shift: Total I/O In: 1665 [I.V.:666; IV Piggyback:999] Out: -    Labs/Studies   Recent Labs Recent Labs    10/05/21 0822  WBC 9.1  HGB 5.0*  HCT 16.0*  PLT 80*   BMET Recent Labs    10/05/21 0822  NA 129*  K 4.4  CL 103  CO2 15*  GLUCOSE 148*  BUN 81*  CREATININE 2.66*  CALCIUM 8.6*   LFT Recent Labs    10/05/21 0822  PROT <3.0*  ALBUMIN <1.5*  AST 150*  ALT 82*  ALKPHOS 65  BILITOT 4.7*   PT/INR Recent Labs    10/05/21 0822  LABPROT 43.8*  INR 4.7*   Hepatitis Panel No results for input(s): "HEPBSAG", "HCVAB", "HEPAIGM", "HEPBIGM" in the last 72 hours. C-Diff No results for input(s): "CDIFFTOX" in the last 72 hours.  Radiology/Studies DG Chest Port 1 View  Result Date: 10/05/2021 CLINICAL DATA:  71 year old male with possible sepsis.  NSTEMI. EXAM: PORTABLE CHEST 1 VIEW COMPARISON:  Chest radiographs 07/16/2021. FINDINGS: Portable AP semi upright view at 0844 hours. Mildly rotated to the right. Low lung volumes stable since March. Mediastinal contours remain within normal limits. Moderate new veiling opacity in the right lung. No pneumothorax. Pulmonary vascularity seems stable, no definite edema. No definite left pleural effusion. No air  bronchograms identified. Visualized tracheal air column is within normal limits. No acute osseous abnormality identified. Negative visible bowel gas pattern. IMPRESSION: Low lung volumes as seen in March with moderate new right lung veiling opacity favored to be new pleural effusion. No overt pulmonary edema. PA and lateral views of the chest would be helpful when feasible. Electronically Signed   By: Genevie Ann M.D.   On: 10/05/2021 08:54     Assessment   Cody Austin is a 71 y.o. year old  male with significant medical history of [name] cirrhosis, CAD, hypothyroidism, PAD, OSA, anemia who presents to the ED via EMS with altered mental status, hypoglycemia, labs with hemoglobin of 5.  GI consulted for further evaluation and management.  Symptomatic anemia/upper GI bleed: Hgb 5 on admission, most recently 7.8 on 6/14.  Recent anemia panel 09/23/2021 with elevated B12, normal folate, iron 140, saturation 88%, ferritin 83.  Platelets 80 and INR elevated at 4.7 (baseline 2.4-2.5).  He will be receiving 2 units of PRBCs and 2 units FFP.  He was hypotensive on arrival and continues to have systolic BPs in the 16X to low 80s.  He was initially bradycardic on arrival with most recent heart rates remain in the 70s to 80s.  He is also mildly tachypneic but was on room air with sats 100% during exam and is pale.  Last EGD 09/23/2021 with grade 1 varices and bleeding portal hypertensive gastropathy s/p Hemospray x4.  He received 1 unit PRBC on 6/12 with hemoglobin 7.9 upon leaving the ED, H&H stable on 6/14 at time of paracentesis.  Patient and wife noted loose and dark stools over the last few days.  Differentials include variceal bleeding, bleeding from portal hypertensive gastropathy, gastritis, or duodenitis.  Should continue PPI infusion and octreotide bolus with infusion and continue to follow H&H.  Goal hemoglobin 7-8.  He is currently too unstable to undergo endoscopic evaluation today as he needs to be more hemodynamically stable and his INR to return closer to baseline.  NASH cirrhosis: Decompensated.  Recent MRCP 2/27 with 2 hepatic lesions concerning for hepatocellular carcinoma, portal vein thrombosis, ascites requiring paracentesis about once weekly.  Most recent paracentesis on 6/14 removing 4.5 L with no evidence of SBP.  LFTs stable, alk phos normal, T. bili 4.7 which appears to be around baseline.  Creatinine elevated 2.66 and albumin  < 1.5.  Has previously had hepatic encephalopathy although  patient drowsy but oriented x4 today without asterixis.  He is not a transplant candidate due to triple-vessel CAD.  He recently had appointment with IR to evaluate portal vein thrombosis and consideration of TIPS.  Per IR they feel as though he is very high risk for TIPS procedure and that their goal is to increase antegrade/hepatopetal portal flow and had plan for transjugular, transhepatic portal venogram with possible portal vein angioplasty, possible portal vein stent placement and possible splenorenal shunt coil embolization.  If liver function were to improve after performing this procedure he could potentially be candidate for TIPS in the future.  It was also discussed of potential to pursue liver directed therapy for [facility] if his liver function improves remarkably.  He was scheduled to undergo this procedure on 6/23.  Current MELD Na: 39, Child Pugh: C (13).  He presented with elevated INR and concern for another upper GI bleed as stated above.  If able to take p.o. we will continue Xifaxan 550 mg twice daily and lactulose 20 g 3 times daily.  No  indication for paracentesis today.  Prognosis is guarded.  Hypotension/concern for sepsis: Most recent paracentesis without SBP.  White count 9.1, previously has had leukopenia.  No fevers present, patient mildly hypothermic with warming blanket on.  Patient denies any recent fever or chills.  Unlikely due to SBP, blood cultures pending.  Likely result of worsening liver failure.    Spoke with patient's wife [name] at bedside.  Answered all questions that she had.  I discussed with her along with the ED physician Dr. Ashok Cordia that he is in critical condition currently given his elevated INR, low hemoglobin, and hypotension.  I discussed with her that we cannot complete another endoscopic evaluation for possible intervention until he is more hemodynamically stable.  Per Dr. Woodroe Chen he has been in contact with CCM who will reassess the patient this afternoon after  he has received some blood products and determine if he will be admitted to [facility], ICU or transfer to [facility].  Patient is continue to express his wish to be DNR.  Plan / Recommendations   Continue PPI infusion Octreotide 50 mcg bolus and infusion Agree with PRBC, likely needs FFP given INR Monitor H/H, transfuse for Hgb <7, goal Hgb 7-8 Daily CBC, CMP, PT/INR Would consider albumin infusion 50g Continue Xifaxin 550 mg BID Continue lactulose 20g TID, will transition to enema if patient unable to take PO Consider addition of ceftriaxone 1g daily for SBP prophylaxis given possible sepsis.  Hold off on endoscopic evaluation today, reassess tomorrow.  May benefit from palliative care consult, guarded prognosis.       10/05/2021, 9:32 AM  Venetia Night, MSN, FNP-BC, AGACNP-BC [facility]

## 2021-10-06 ENCOUNTER — Inpatient Hospital Stay (HOSPITAL_COMMUNITY): Payer: Medicare Other

## 2021-10-06 ENCOUNTER — Encounter (HOSPITAL_COMMUNITY): Payer: Self-pay | Admitting: Internal Medicine

## 2021-10-06 DIAGNOSIS — R578 Other shock: Secondary | ICD-10-CM

## 2021-10-06 DIAGNOSIS — I214 Non-ST elevation (NSTEMI) myocardial infarction: Secondary | ICD-10-CM | POA: Diagnosis not present

## 2021-10-06 DIAGNOSIS — K922 Gastrointestinal hemorrhage, unspecified: Secondary | ICD-10-CM | POA: Diagnosis not present

## 2021-10-06 DIAGNOSIS — R188 Other ascites: Secondary | ICD-10-CM | POA: Diagnosis not present

## 2021-10-06 DIAGNOSIS — K921 Melena: Secondary | ICD-10-CM

## 2021-10-06 DIAGNOSIS — N179 Acute kidney failure, unspecified: Secondary | ICD-10-CM | POA: Diagnosis not present

## 2021-10-06 DIAGNOSIS — D684 Acquired coagulation factor deficiency: Secondary | ICD-10-CM

## 2021-10-06 DIAGNOSIS — K7460 Unspecified cirrhosis of liver: Secondary | ICD-10-CM | POA: Diagnosis not present

## 2021-10-06 LAB — COMPREHENSIVE METABOLIC PANEL
ALT: 667 U/L — ABNORMAL HIGH (ref 0–44)
AST: 1454 U/L — ABNORMAL HIGH (ref 15–41)
Albumin: 2 g/dL — ABNORMAL LOW (ref 3.5–5.0)
Alkaline Phosphatase: 67 U/L (ref 38–126)
Anion gap: 10 (ref 5–15)
BUN: 74 mg/dL — ABNORMAL HIGH (ref 8–23)
CO2: 16 mmol/L — ABNORMAL LOW (ref 22–32)
Calcium: 8.5 mg/dL — ABNORMAL LOW (ref 8.9–10.3)
Chloride: 107 mmol/L (ref 98–111)
Creatinine, Ser: 2.86 mg/dL — ABNORMAL HIGH (ref 0.61–1.24)
GFR, Estimated: 23 mL/min — ABNORMAL LOW (ref >60)
Glucose, Bld: 91 mg/dL (ref 70–99)
Potassium: 4.2 mmol/L (ref 3.5–5.1)
Sodium: 133 mmol/L — ABNORMAL LOW (ref 135–145)
Total Bilirubin: 7.8 mg/dL — ABNORMAL HIGH (ref 0.3–1.2)
Total Protein: 4.1 g/dL — ABNORMAL LOW (ref 6.5–8.1)

## 2021-10-06 LAB — GLUCOSE, CAPILLARY
Glucose-Capillary: 73 mg/dL (ref 70–99)
Glucose-Capillary: 78 mg/dL (ref 70–99)
Glucose-Capillary: 78 mg/dL (ref 70–99)
Glucose-Capillary: 90 mg/dL (ref 70–99)
Glucose-Capillary: 90 mg/dL (ref 70–99)
Glucose-Capillary: 92 mg/dL (ref 70–99)
Glucose-Capillary: 95 mg/dL (ref 70–99)

## 2021-10-06 LAB — TYPE AND SCREEN
ABO/RH(D): A POS
Antibody Screen: NEGATIVE
Unit division: 0
Unit division: 0
Unit division: 0

## 2021-10-06 LAB — CBC
HCT: 23.2 % — ABNORMAL LOW (ref 39.0–52.0)
HCT: 18.7 % — ABNORMAL LOW (ref 39.0–52.0)
HCT: 18.9 % — ABNORMAL LOW (ref 39.0–52.0)
Hemoglobin: 7.6 g/dL — ABNORMAL LOW (ref 13.0–17.0)
Hemoglobin: 6.4 g/dL — CL (ref 13.0–17.0)
Hemoglobin: 6.6 g/dL — CL (ref 13.0–17.0)
MCH: 33.3 pg (ref 26.0–34.0)
MCH: 34 pg (ref 26.0–34.0)
MCH: 34.2 pg — ABNORMAL HIGH (ref 26.0–34.0)
MCHC: 32.8 g/dL (ref 30.0–36.0)
MCHC: 34.2 g/dL (ref 30.0–36.0)
MCHC: 34.9 g/dL (ref 30.0–36.0)
MCV: 101.8 fL — ABNORMAL HIGH (ref 80.0–100.0)
MCV: 99.5 fL (ref 80.0–100.0)
MCV: 97.9 fL (ref 80.0–100.0)
Platelets: 41 10*3/uL — ABNORMAL LOW (ref 150–400)
Platelets: 32 10*3/uL — ABNORMAL LOW (ref 150–400)
Platelets: 48 10*3/uL — ABNORMAL LOW (ref 150–400)
RBC: 2.28 MIL/uL — ABNORMAL LOW (ref 4.22–5.81)
RBC: 1.88 MIL/uL — ABNORMAL LOW (ref 4.22–5.81)
RBC: 1.93 MIL/uL — ABNORMAL LOW (ref 4.22–5.81)
RDW: 22.1 % — ABNORMAL HIGH (ref 11.5–15.5)
RDW: 22.5 % — ABNORMAL HIGH (ref 11.5–15.5)
RDW: 21.7 % — ABNORMAL HIGH (ref 11.5–15.5)
WBC: 10 10*3/uL (ref 4.0–10.5)
WBC: 8.4 10*3/uL (ref 4.0–10.5)
WBC: 9.9 10*3/uL (ref 4.0–10.5)
nRBC: 0.3 % — ABNORMAL HIGH (ref 0.0–0.2)
nRBC: 0.2 % (ref 0.0–0.2)
nRBC: 0.2 % (ref 0.0–0.2)

## 2021-10-06 LAB — PREPARE FRESH FROZEN PLASMA
Unit division: 0
Unit division: 0

## 2021-10-06 LAB — BPAM FFP
Blood Product Expiration Date: 202306242359
Blood Product Expiration Date: 202306242359
ISSUE DATE / TIME: 202306191431
ISSUE DATE / TIME: 202306191613
Unit Type and Rh: 6200
Unit Type and Rh: 6200

## 2021-10-06 LAB — ECHOCARDIOGRAM COMPLETE
AR max vel: 1.39 cm2
AV Area VTI: 1.39 cm2
AV Area mean vel: 1.32 cm2
AV Mean grad: 22 mmHg
AV Peak grad: 39.9 mmHg
Ao pk vel: 3.16 m/s
Area-P 1/2: 3.19 cm2
Height: 68 in
MV M vel: 4.89 m/s
MV Peak grad: 95.6 mmHg
MV VTI: 2.02 cm2
Radius: 0.6 cm
S' Lateral: 3.8 cm
Weight: 2853.63 oz

## 2021-10-06 LAB — PREPARE RBC (CROSSMATCH)

## 2021-10-06 LAB — BPAM RBC
Blood Product Expiration Date: 202306302359
Blood Product Expiration Date: 202307132359
Blood Product Expiration Date: 202307132359
ISSUE DATE / TIME: 202306191026
ISSUE DATE / TIME: 202306191229
ISSUE DATE / TIME: 202306191625
Unit Type and Rh: 6200
Unit Type and Rh: 6200
Unit Type and Rh: 9500

## 2021-10-06 LAB — PHOSPHORUS: Phosphorus: 6.1 mg/dL — ABNORMAL HIGH (ref 2.5–4.6)

## 2021-10-06 LAB — URINE CULTURE: Culture: NO GROWTH

## 2021-10-06 LAB — MAGNESIUM: Magnesium: 2 mg/dL (ref 1.7–2.4)

## 2021-10-06 LAB — PROTIME-INR
INR: 3.4 — ABNORMAL HIGH (ref 0.8–1.2)
Prothrombin Time: 34.4 seconds — ABNORMAL HIGH (ref 11.4–15.2)

## 2021-10-06 LAB — LACTIC ACID, PLASMA: Lactic Acid, Venous: 4.1 mmol/L (ref 0.5–1.9)

## 2021-10-06 LAB — TROPONIN I (HIGH SENSITIVITY): Troponin I (High Sensitivity): 13121 ng/L (ref <18)

## 2021-10-06 LAB — AMMONIA: Ammonia: 36 umol/L — ABNORMAL HIGH (ref 9–35)

## 2021-10-06 MED ORDER — DEXTROSE 10 % IV SOLN: INTRAVENOUS | Status: DC

## 2021-10-06 MED ORDER — VITAMIN K1 10 MG/ML IJ SOLN
10.0000 mg | Freq: Every day | INTRAVENOUS | Status: AC
  Administered 2021-10-06 – 2021-10-07 (×2): 10 mg via INTRAVENOUS
  Filled 2021-10-06 (×2): qty 1

## 2021-10-06 MED ORDER — LACTULOSE ENEMA
300.0000 mL | Freq: Two times a day (BID) | RECTAL | Status: DC
Start: 2021-10-06 — End: 2021-10-12
  Administered 2021-10-06 – 2021-10-11 (×7): 300 mL via RECTAL
  Filled 2021-10-06 (×13): qty 300

## 2021-10-06 MED ORDER — DEXTROSE 20 % IV SOLN
INTRAVENOUS | Status: DC
Start: 2021-10-06 — End: 2021-10-12
  Filled 2021-10-06 (×11): qty 500

## 2021-10-06 MED ORDER — SODIUM CHLORIDE 0.9 % IV SOLN
2.0000 g | INTRAVENOUS | Status: DC
  Administered 2021-10-06: 2 g via INTRAVENOUS
  Filled 2021-10-06: qty 20

## 2021-10-06 MED ORDER — ORAL CARE MOUTH RINSE
15.0000 mL | OROMUCOSAL | Status: DC
Start: 2021-10-06 — End: 2021-10-11
  Administered 2021-10-06 – 2021-10-11 (×17): 15 mL via OROMUCOSAL

## 2021-10-06 MED ORDER — SODIUM CHLORIDE 0.9% IV SOLUTION: Freq: Once | INTRAVENOUS | Status: DC

## 2021-10-06 MED ORDER — CHLORHEXIDINE GLUCONATE CLOTH 2 % EX PADS
6.0000 | MEDICATED_PAD | Freq: Every day | CUTANEOUS | Status: DC
  Administered 2021-10-06 – 2021-10-16 (×11): 6 via TOPICAL

## 2021-10-06 MED ORDER — ORAL CARE MOUTH RINSE: 15.0000 mL | OROMUCOSAL | Status: DC | PRN

## 2021-10-06 NOTE — Consult Note (Signed)
[facility] Consult: 9:39 AM 10/06/2021  LOS: 1 day    Referring Provider: CCM.  Dr Loanne Drilling.    Primary Care Physician:  Rogene Houston, MD Primary Gastroenterologist:  Dr [facility]     Reason for Consultation: Monitor.  Recent GI bleed.   HPI: Cody Austin is a 71 y.o. male.  PMH NASH related cirrhosis.  Non-STEMI 06/2021.  CAD, severe three-vessel disease not a candidate for CABG, not PCI candidate due to AKI.  Thrombocytopenia.  Coagulopathy.  Acute on chronic anemia..  Non-STEMI.  OSA on CPAP.  Hypothyroidism.  PAD.  Chronic, nonocclusive portal vein thrombus/stenosis.  Massive splenorenal shunt.  Hepatocellular carcinoma.  Hepatorenal syndrome.    08/2019 EGD: Screening procedure.  Normal esophagus.  Portal hypertensive gastropathy without bleeding.  Congested duodenal mucosa, no biopsies. 09/2020 EGD.  For repeat surveillance.  Grade 1 distal esophageal varices without stigmata or active bleeding.  Mild portal hypertensive gastropathy throughout the stomach.  09/23/2021 EGD for IDA, chronic blood loss.  Nonbleeding grade 1 esophageal varices.  Portal hypertensive gastropathy with bleeding/oozing of blood from antrum and prepyloric mucosa treated with Hemospray achieving hemostasis.  No interventions performed on the varices. 03/2021 Abd ultrasound: Cirrhosis.  No obvious hepatoma.  Limited PV evaluation likely secondary to retrograde flow via prominent splenorenal shunt.  Cholelithiasis.  Splenomegaly.  Perisplenic varicosities. 06/15/2021 MR abdomen: 12 mm hyperenhancing lesion in segment 7-8 of liver, probably hepatocellular carcinoma.  Focus of delayed enhancement in segment 4 with intermediate probability of malignancy.  Large volume ascites.  Splenomegaly.  Portosystemic collaterals.  Portal  colopathy/enteropathy.  Cirrhotic liver.  Diminutive portal vein, and large splenic and superior mesenteric veins all of these appear patent.  16 mm cystic lesion at head of pancreas/uncinate process stable compared with previous and likely reflects sidebranch IPMN. AFP 10.3 in early 08/2021.    Received 3 PRBCs in late March/early April to address Hb 7.2.  Iron/anemia panel did not confirm iron, B12, folate deficiency.  Platelets were 52 K, INR 2.7.  Also had AKI, NSTEMI.  Underwent diagnostic paracentesis, no evidence for SBP.  Started on lactulose and rifaximin for hepatic encephalopathy.  GI service did not pursue repeat EGD as no sign of active bleeding. Meld-NA score 27, MELD 26.  Child-Pugh C.  S/p several paracentesis since the initial tap of 3/20.  Initial volume removed was 3.7 L, subsequently removed around 1.7 L but at tap of 6/14 4.5 L removed. Not on diuretics.   Other complications include worsening renal function.   Referred to IR for TIPS evaluation.  Deemed not a liver transplant candidate due to CAD, recent MI. Seen by IR Dr. Serafina Royals on 6/15.  High risk for TIPS.  Discussed option for portal vein angioplasty, possible stent to improve antegrade flow via percutaneous or transjugular approach, the latter being safest given his coagulopathy, anemia.  Other possibilities discussed were concomitant embolization of splenorenal shunt.  MDs prediction was improving antegrade flow without creating a TIPS would improve liver function but may increase portal hypertension and increase ascites.  Eventual TIPS might be a possibility in future.  Also discussed was liver directed therapy for HCC if liver function improved.  IR intervention scheduled for 6/23.  Seen at [place] ED yesterday by GI service regarding Hb of 5, treated with 2 PRBCs.  Platelets 80.  INR 4.7.  He woke up that morning feeling weak and dyspneic.  Pt reported tarry, black stools and was FOBT positive.  Per EMS was hypotensive,  bradycardic at 38. No nausea, vomiting. Chest x-ray: Right-sided pleural effusion and interstitial prominence compatible with mild edema.  Protonix,octreotide drips initiated along with Levophed, albumin infusion x 3, Rocephin.  Transferred to [place] for advanced care, hemodynamic instability requiring vasopressors.  Felt to be in hemorrhagic shock.  Has not required intubation.  CODE STATUS is DNR. However family hopeful and want exhaust available measures to address problems.    Today's labs sodium 133.  BUN/creatinine 74/2.8.  T. bili 7.8.  Alk phos 67.  AST/ALT 1454/667.  These are elevated c/w yesterday and previous weeks.  Ammonia 36.  Troponin 7131 Hgb 7.6.  MCV 101.  Platelets 41 K.  INR 3.4.  Current pressure on 09/63 with heart rate 78.  On nasal cannula oxygen.  IR has not yet seen patient.  RN reports overnight he has had a few melenic stools of small to moderate quantity.  Confirms no nausea or vomiting or abdominal pain.  Patient's son is a pathologist in [place], [place]. Patient does not drink.  Lives with his wife in [place].  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  Past Medical History:  Diagnosis Date   AKI (acute kidney injury) (HCC) 07/17/2021   Arthritis    Coronary artery disease    Heart murmur    History of kidney stones    Hypothyroidism 07/17/2021   NASH Liver cirrhosis secondary to NASH (nonalcoholic steatohepatitis) 06/01/2019   NSTEMI (non-ST elevated myocardial infarction) (HCC)    Other pancytopenia (HCC) 11/19/2014   Peripheral arterial disease (HCC) 05/28/2019   Sleep apnea    uses CPAP    Past Surgical History:  Procedure Laterality Date   CATARACT EXTRACTION W/PHACO Right 08/12/2014   Procedure: CATARACT EXTRACTION PHACO AND INTRAOCULAR LENS PLACEMENT (IOC);  Surgeon: Kerry Hunt, MD;  Location: [facility];  Service: Ophthalmology;  Laterality: Right;  CDE 9.32   CATARACT EXTRACTION W/PHACO Left 08/26/2014   Procedure: CATARACT  EXTRACTION PHACO AND INTRAOCULAR LENS PLACEMENT (IOC);  Surgeon: Kerry Hunt, MD;  Location: [facility];  Service: Ophthalmology;  Laterality: Left;  CDE:  9.49   COLONOSCOPY N/A 12/13/2014   Procedure: COLONOSCOPY;  Surgeon: Najeeb U Rehman, MD;  Location: [facility];  Service: Endoscopy;  Laterality: N/A;  100   ESOPHAGEAL BANDING N/A 08/31/2019   Procedure: ESOPHAGEAL BANDING;  Surgeon: Rehman, Najeeb U, MD;  Location: [facility];  Service: Endoscopy;  Laterality: N/A;   ESOPHAGOGASTRODUODENOSCOPY N/A 12/13/2014   Procedure: ESOPHAGOGASTRODUODENOSCOPY (EGD);  Surgeon: Najeeb U Rehman, MD;  Location: [facility];  Service: Endoscopy;  Laterality: N/A;   ESOPHAGOGASTRODUODENOSCOPY (EGD) WITH PROPOFOL N/A 08/31/2019   Procedure: ESOPHAGOGASTRODUODENOSCOPY (EGD) WITH PROPOFOL;  Surgeon: Rehman, Najeeb U, MD;  [facility]: [facility];  Service: Endoscopy;  Laterality: N/A;  1200   ESOPHAGOGASTRODUODENOSCOPY (EGD) WITH PROPOFOL N/A 10/01/2020   Procedure: ESOPHAGOGASTRODUODENOSCOPY (EGD) WITH PROPOFOL;  Surgeon: Rehman, Najeeb U, MD;  [facility]: [facility];  Service: Endoscopy;  Laterality: N/A;  10:00   ESOPHAGOGASTRODUODENOSCOPY (EGD) WITH PROPOFOL N/A 09/23/2021   Procedure: ESOPHAGOGASTRODUODENOSCOPY (EGD)   WITH PROPOFOL;  Surgeon: Rehman, Najeeb U, MD;  Location: [facility];  Service: Endoscopy;  Laterality: N/A;   HERNIA REPAIR Right    Inguinal hernia   TONSILLECTOMY      Prior to Admission medications   Medication Sig Start Date End Date Taking? Authorizing Provider  atorvastatin (LIPITOR) 20 MG tablet Take 20 mg by mouth at bedtime.  08/25/17  Yes [provider]  isosorbide mononitrate (IMDUR) 30 MG 24 hr tablet Take 30 mg by mouth daily. 09/22/21  Yes [provider]  lactulose (CHRONULAC) 10 GM/15ML solution Take 30 mLs (20 g total) by mouth 3 (three) times daily. Goal is 2 bowel movements daily. 07/20/21  Yes Chatterjee, Srobona Tublu, MD  levothyroxine (SYNTHROID) 75 MCG  tablet Take 0.5 tablets (37.5 mcg total) by mouth daily before breakfast. 09/22/21  Yes Rehman, Najeeb U, MD  metoprolol tartrate (LOPRESSOR) 25 MG tablet Take 0.5 tablets (12.5 mg total) by mouth 2 (two) times daily. 08/10/21  Yes Chandrasekhar, [name], MD  Omega-3 Fatty Acids (OMEGA 3 500 PO) Take 500 mg by mouth at bedtime.   Yes [provider]  pantoprazole (PROTONIX) 40 MG tablet Take 1 tablet (40 mg total) by mouth 2 (two) times daily before a meal. 09/24/21  Yes Memon, Jehanzeb, MD  rifaximin (XIFAXAN) 550 MG TABS tablet Take 1 tablet (550 mg total) by mouth 2 (two) times daily. Patient taking differently: Take 550 mg by mouth 2 (two) times daily. Continuously 08/12/20  Yes Rehman, Najeeb U, MD  zinc gluconate 50 MG tablet Take 50 mg by mouth daily.   Yes [provider]    Scheduled Meds:  sodium chloride   Intravenous Once   sodium chloride   Intravenous Once   Chlorhexidine Gluconate Cloth  6 each Topical Q0600   lactulose  300 mL Rectal BID   mouth rinse  15 mL Mouth Rinse 4 times per day   rifaximin  550 mg Oral BID   Infusions:  sodium chloride Stopped (10/05/21 1929)   cefTRIAXone (ROCEPHIN)  IV     dextrose     norepinephrine (LEVOPHED) Adult infusion 5 mcg/min (10/06/21 0800)   octreotide (SANDOSTATIN) 500 mcg in sodium chloride 0.9 % 250 mL (2 mcg/mL) infusion 50 mcg/hr (10/06/21 0600)   pantoprazole 8 mg/hr (10/06/21 0600)   phytonadione (VITAMIN K) 10 mg in dextrose 5 % 50 mL IVPB     PRN Meds: ondansetron (ZOFRAN) IV, mouth rinse   Allergies as of 10/05/2021   (No Known Allergies)    Family History  Problem Relation Age of Onset   Cancer Father    Aneurysm Father     Social History   Socioeconomic History   Marital status: Married    Spouse name: Not on file   Number of children: Not on file   Years of education: Not on file   Highest education level: Not on file  Occupational History   Not on file  Tobacco Use   Smoking status:  Never    Passive exposure: Never   Smokeless tobacco: Never  Vaping Use   Vaping Use: Never used  Substance and Sexual Activity   Alcohol use: Not Currently   Drug use: No   Sexual activity: Yes  Other Topics Concern   Not on file  Social History Narrative   Not on file   Social Determinants of Health   Financial Resource Strain: Not on file  Food Insecurity: Not on file  Transportation Needs:   Not on file  Physical Activity: Not on file  Stress: Not on file  Social Connections: Not on file  Intimate Partner Violence: Not on file    REVIEW OF SYSTEMS: Constitutional: Some weakness.  No profound fatigue. ENT:  No nose bleeds.  Patient denies oral bleeding or taste of blood in his mouth Pulm: Dyspnea without cough CV:  No palpitations, no LE edema.  No angina. GU:  No hematuria, no frequency GI: See HPI. Heme: Bruises easily but other than the melena, no unusual bleeding or bruising Transfusions: See HPI. Neuro:  No headaches, no peripheral tingling or numbness.  No syncope, no seizures Derm:  No itching, no rash or sores.  Endocrine:  No sweats or chills.  No polyuria or dysuria Immunization: un Known. Travel:  None beyond local counties in last few months.    PHYSICAL EXAM: Vital signs in last 24 hours: Vitals:   10/06/21 0645 10/06/21 0740  BP: (!) 104/50   Pulse: 79   Resp: 17   Temp:  (!) 96 F (35.6 C)  SpO2: 100%    Wt Readings from Last 3 Encounters:  10/06/21 80.9 kg  09/28/21 99.5 kg  09/22/21 99.5 kg    General: Obese, acutely, chronically ill-appearing.  Resting comfortably on bed. Head: No signs of head trauma or facial asymmetry. Eyes: Conjunctiva pink.  No icterus. Ears: No obvious hearing deficit Nose: No congestion or discharge Mouth: Dry oral mucosa.  No blood in the mouth.  Fair dentition.  Tongue midline.  Lips with lining of dark, looking like dried blood. Neck: No masses.  No JVD Lungs: Diminished breath sounds.  Some increased work  of breathing.  No cough. Heart: RRR.  No MRG.  S1, S2 present Abdomen: Tense, protuberant.  Not tender.  Bowel sounds present.  Do not appreciate organomegaly, hernias, bruits.   Rectal: Deferred. Musc/Skeltl: No obvious joint deformities Extremities: Pitting pedal edema, mild. Neurologic: Mild asterixis.  Alert.  Oriented to place, year, self, situation.  Psychomotor slowing but answers questions appropriately.  Moves all 4 limbs, strength not tested. Skin: No obvious sores, rashes or suspicious lesions Nodes: No cervical adenopathy Psych: Flat affect, cooperative.  Not agitated  Intake/Output from previous day: 06/19 0701 - 06/20 0700 In: 11252.9 [I.V.:5167.8; Blood:2470; IV Piggyback:3595.1] Out: 102 [Urine:102] Intake/Output this shift: No intake/output data recorded.  LAB RESULTS: Recent Labs    10/05/21 1620 10/05/21 2138 10/06/21 0133  WBC 9.1 10.1 10.0  HGB 7.1* 7.9* 7.6*  HCT 22.7* 23.8* 23.2*  PLT 61* 46* 41*   BMET Lab Results  Component Value Date   NA 133 (L) 10/06/2021   NA 129 (L) 10/05/2021   NA 133 (L) 09/23/2021   K 4.2 10/06/2021   K 4.4 10/05/2021   K 4.1 09/23/2021   CL 107 10/06/2021   CL 103 10/05/2021   CL 107 09/23/2021   CO2 16 (L) 10/06/2021   CO2 15 (L) 10/05/2021   CO2 24 09/23/2021   GLUCOSE 91 10/06/2021   GLUCOSE 148 (H) 10/05/2021   GLUCOSE 79 09/23/2021   BUN 74 (H) 10/06/2021   BUN 81 (H) 10/05/2021   BUN 46 (H) 09/23/2021   CREATININE 2.86 (H) 10/06/2021   CREATININE 2.66 (H) 10/05/2021   CREATININE 1.40 (H) 09/23/2021   CALCIUM 8.5 (L) 10/06/2021   CALCIUM 8.6 (L) 10/05/2021   CALCIUM 8.3 (L) 09/23/2021   LFT Recent Labs    10/05/21 0822 10/06/21 0133  PROT <3.0* 4.1*  ALBUMIN <1.5* 2.0*    AST 150* 1,454*  ALT 82* 667*  ALKPHOS 65 67  BILITOT 4.7* 7.8*   PT/INR Lab Results  Component Value Date   INR 3.4 (H) 10/06/2021   INR 4.7 (HH) 10/05/2021   INR 2.4 (H) 09/30/2021   Hepatitis Panel No results for  input(s): "HEPBSAG", "HCVAB", "HEPAIGM", "HEPBIGM" in the last 72 hours. C-Diff No components found for: "CDIFF" Lipase  No results found for: "LIPASE"  Drugs of Abuse     Component Value Date/Time   LABOPIA NONE DETECTED 05/28/2019 1759   COCAINSCRNUR NONE DETECTED 05/28/2019 1759   LABBENZ NONE DETECTED 05/28/2019 1759   AMPHETMU NONE DETECTED 05/28/2019 1759   THCU NONE DETECTED 05/28/2019 1759   LABBARB NONE DETECTED 05/28/2019 1759     RADIOLOGY STUDIES: DG Chest Port 1 View  Result Date: 10/06/2021 CLINICAL DATA:  Post blood transfusion, assess for edema. EXAM: PORTABLE CHEST 1 VIEW COMPARISON:  October 05, 2021. FINDINGS: EKG leads project over the chest. Cardiomediastinal contours and hilar structures are stable, cardiomediastinal contours and hilar structures are stable and partially obscured by graded opacity in the RIGHT chest which is similar to previous imaging. Mild increased interstitial markings are present throughout the chest. On limited assessment there is no acute skeletal finding. IMPRESSION: 1. RIGHT-sided pleural effusion and interstitial prominence likely compatible with mild edema similar to previous imaging. Electronically Signed   By: Zetta Bills M.D.   On: 10/06/2021 08:17   DG Chest Port 1 View  Result Date: 10/05/2021 CLINICAL DATA:  71 year old male with possible sepsis.  NSTEMI. EXAM: PORTABLE CHEST 1 VIEW COMPARISON:  Chest radiographs 07/16/2021. FINDINGS: Portable AP semi upright view at 0844 hours. Mildly rotated to the right. Low lung volumes stable since March. Mediastinal contours remain within normal limits. Moderate new veiling opacity in the right lung. No pneumothorax. Pulmonary vascularity seems stable, no definite edema. No definite left pleural effusion. No air bronchograms identified. Visualized tracheal air column is within normal limits. No acute osseous abnormality identified. Negative visible bowel gas pattern. IMPRESSION: Low lung volumes as  seen in March with moderate new right lung veiling opacity favored to be new pleural effusion. No overt pulmonary edema. PA and lateral views of the chest would be helpful when feasible. Electronically Signed   By: Genevie Ann M.D.   On: 10/05/2021 08:54      IMPRESSION:   Ongoing GI bleeding likely from portal hypertensive gastropathy in setting of coagulopathy, thrombocytopenia due to advanced/decompensated cirrhosis.  Latest EGD on 6/7 again confirmed nonbleeding, grade 1 distal esophageal varices.  Active bleeding/oozing from portal hypertensive gastropathy was treated with Hemospray.  Acute on chronic blood loss anemia.  Received 5 PRBCs as far, 2 at [facility] prior to transfer.  Previous transfusions for same within last few months.    Decompensated Nash cirrhosis.  HCCA, likely based on previous MRI as recently as 05/2021.  Ascites being managed with paracentesis every 10 to 14 days.  IR, Dr. Serafina Royals planned intervention 6/30.  Per review of his 6/15 consult note possibilities for intervention included portal vein angioplasty and possible PV stenting, splenorenal shunt embolization.  TIPS was not considered at that time but might be a possibility in future if liver function improved.  Chronic, nonocclusive PV thrombosis/stenosis.  Thrombocytopenia.  Coagulopathy.  Received 2 PRBCs yesterday evening.  Got 10 mg vitamin K this morning.  AKI.  GFR 23.    Hx HE.  Compliant with rifaximin, Chronulac.  Current ammonia is 36.  Three-vessel CAD, not  a candidate for stenting or CABG.  Note acutely elevated troponin, suspect element of shock  Hyponatremia.   PLAN:     Spoke with the patient's daughter who was also on speaker phone with patient's wife.  Daughter is Melanie Morrison [phone].  The plan after discussing with Dr. Dorsey is to attempt to raise platelet level to 50 or greater with platelet transfusion.  If and when platelets are 50 or better, will proceed with upper  endoscopy.  This could be this afternoon.  Daughter expressed agreement/approval with plan.  Proceed with 2 unit platelet transfusion and recheck platelet levels after completion.  3 doses of vitamin K in addition to platelet transfusion.  CCM has also ordered additional doses of vitamin K for next 2 days.   Arjuna Doeden  10/06/2021, 9:39 AM Phone [phone]      

## 2021-10-06 NOTE — Progress Notes (Signed)
Received consult for IV. Unit RN was able to gain access. Due to multiple IV sticks, current medications and pt status, VAS Team recommends considering CVAD placement.

## 2021-10-06 NOTE — Progress Notes (Signed)
[facility] Progress Note Patient Name: Cody Austin DOB: Apr 17, 1951 MRN: [id number]   Date of Service  10/06/2021  HPI/Events of Note  Hgb 6.6  eICU Interventions  1 unit PRBC ordered     Intervention Category Intermediate Interventions: Bleeding - evaluation and treatment with blood products  Mauri Brooklyn, P 10/06/2021, 8:48 PM

## 2021-10-06 NOTE — Progress Notes (Addendum)
NAME:  Cody Austin, MRN:  [id number], DOB:  February 19, 1951, LOS: 1 ADMISSION DATE:  10/05/2021, CONSULTATION DATE:  10/05/21 REFERRING MD:  [facility] ED, CHIEF COMPLAINT:  Hemorrhagic shock   History of Present Illness:  71 yowm NCB pt  with NASH cirrhosis  receiving paracentesis approximately every 2 weeks yielding 4.5 most recently on 09/30/21 with w/u c/w main portal vein stenosis likely secondary to chronic non-occlusive thrombus, a massive splenorenal shunt, advanced cirrhosis, hepatocellular carcinoma, hepatorenal syndrome, and hyperbilirubinemia.  He was recently admitted to [facility] with acute anemia and melena, to complicate his case further, and underwent EGD (09/23/21) which showed grade 1 esophageal varices and moderate diffuse portal hypertensive gastropathy with antral bleeding that was controlled with hemostatic spray.  He required blood transfusion during this admission.    Presented am 6/19 to [facility] ER with weakness, confusion  and low cbg/ hgb and PCCM consulted re transfer to [facility] where he is scheduled to undergo attempting portal vein angioplasty/stenting and shunt embolization on 4/96 with main complication = florid terminal liver failure per IR's note 10/01/21 and pt agreed as alternative to repeat massive paracentesis.   Pertinent  Medical History  NASh cirrhosis complicated by recurrent ascites, portal vein stenosis secondary to chronic nonocclusive thrombus, splenorenal shunt, hepatocellular carcinoma, hepatorenal syndrome, grade 1 varices, portal hypertensive gastropathy, OSA  Significant Hospital Events: Including procedures, antibiotic start and stop dates in addition to other pertinent events   6/19 Presented to [facility] and transferred [facility]  Interim History / Subjective:  On levophed 6. No complaints  Objective   Blood pressure (!) 104/50, pulse 79, temperature (!) 96 F (35.6 C), temperature source Oral, resp. rate 17, height 5' 8"  (1.727 m), weight 80.9 kg, SpO2 100 %.         Intake/Output Summary (Last 24 hours) at 10/06/2021 0838 Last data filed at 10/06/2021 0600 Gross per 24 hour  Intake 11252.87 ml  Output 102 ml  Net 11150.87 ml   Filed Weights   10/05/21 0814 10/06/21 0500  Weight: 80.9 kg 80.9 kg   Physical Exam: General: Well-appearing, no acute distress HENT: [place], AT, OP clear, MMM Eyes: EOMI, no scleral icterus Respiratory: Clear to auscultation bilaterally.  No crackles, wheezing or rales Cardiovascular: RRR, -M/R/G, no JVD GI: BS+, distended, mild tenderness to palpation Extremities: 2+ edema up to his thigh,-tenderness Neuro: AAO x4, CNII-XII grossly intact Skin: Intact, no rashes or bruising Psych: Normal mood, normal affect GU: Foley in place  Resolved Hospital Problem list     Assessment & Plan:   Hemorrhagic shock/acute on chronic blood loss anemia - ddx includes esophageal varices, portal hypertensive gastropathy bleed, ulcer Elevated INR --S/p PRBC x3 FFP x2, vitamin K 10 mg x1 --S/p albumin 50g x 3 --GI consulted --Wean levophed for MAP >65 --Continue PPI --Continue Octreotide --Trend CBC, INR --Hold antihypertensives --Trend LA  Decompensated NASH cirrhosis Possible HCC - not a candidate for chemo or TACE Hepatorenal syndrome Followed by [facility]. Not a transplant candidate due to CADx3 Previously evaluated by IR for portal vein thrombus. Not a candidate for TIPS but considering stent Hx of paracentesis x 5 in the last two months --Lactulose enema. Hold Xifaxan --Trend CMET --Foley  Klebsiella pneumonia bacteremia --Continue Ceftriaxone --Follow-up blood cultures  NSTEMI in setting of anemia and shock CAD Severe aortic stenosis Trop 7131. Not a candidate for intervention with acute illness --Trend trop --Echo  AKI on CKD IIIa --Monitor UOP/Cr --Avoid nephrotoxic agents  Hypoglycemia --D20 gtt  Hypothyroidism --Restart when able to take PO  OSA --Nightly CPAP  Best Practice (right  click and "Reselect all SmartList Selections" daily)   Diet/type: NPO DVT prophylaxis: not indicated bleed GI prophylaxis: PPI Lines: N/A Foley:  Yes, and it is still needed Code Status:  DNR Last date of multidisciplinary goals of care discussion [6/19] DNR. Discussed with wife and daughter. Continue current care   Critical care time: 14   The patient is critically ill with multiple organ systems failure and requires high complexity decision making for assessment and support, frequent evaluation and titration of therapies, application of advanced monitoring technologies and extensive interpretation of multiple databases.   Rodman Pickle, M.D. [facility] Pulmonary/Critical Care Medicine 10/06/2021 8:38 AM   Please see Amion for pager number to reach on-call Pulmonary and Critical Care Team.

## 2021-10-06 NOTE — IPAL (Signed)
  Interdisciplinary Goals of Care Family Meeting   Date carried out: 10/06/2021  Location of the meeting: Phone conference  Member's involved: Physician and Family Member or next of kin. Spoke with wife and daughter on separate phone calls  Durable Power of Attorney or acting medical decision maker: Wife, Cody Austin    Discussion: We discussed goals of care for Cody Austin .  We discussed his current critically ill condition including his acute blood loss anemia/hemorrhagic shock likely related to liver failure (esophageal varices vs PHG), septic shock secondary to bacteremia, decompensated liver failure, acute on chronic renal failure and NSTEMI. Both family members expressed to pursue any options available for his care including EGD and previously discussed stent procedure for his portal hypertension. Patient and family understand that his condition is critical but wish to be as aggressive as possible. He is DNR but would be willing to be intubated for procedures if needed.  Code status: Full DNR. Ok to for intubation for procedures.  Disposition: Continue current acute care  Time spent for the meeting: 13 min    Cody Calarco Rodman Pickle, MD  10/06/2021, 10:43 AM

## 2021-10-06 NOTE — Progress Notes (Signed)
Referring Physician(s): Dr. Loanne Drilling   Supervising Physician: Ruthann Cancer  Patient Status:  [facility] - In-pt  Chief Complaint: Decompensated NASH cirrhosis  Subjective: Patient in the ICU with daughter, son and wife at the bedside. Patient is asleep/unresponsive. Appears to have agonal respiratory pattern.   HPI: Patient with a history of main portal vein stenosis likely secondary to chronic non-occlusive thrombus, a massive splenorenal shunt, advanced cirrhosis, hepatocellular carcinoma, hepatorenal syndrome and hyperbilirubinemia. He was recently admitted to [facility] with acute anemia and melena and underwent EGD (09/23/21) which showed grade 1 esophageal varices and moderate diffuse portal hypertensive gastropathy with antral bleeding that was controlled with hemostatic spray.   The patient and his family met with Dr. Serafina Royals 10/01/21 at the outpatient clinic to discuss the procedures performed by Interventional Radiology to improve portal hypertension. The plan was for the patient to undergo a transjugular, transhepatic portal venogram with possible portal vein angioplasty, possible portal vein stent placement, in addition to splenorenal shunt coil embolization. This was scheduled for June 23.   The patient is now admitted to the ICU for hemorrhagic shock/acute on chronic blood loss anemia.  Interventional Radiology was contacted and requested to evaluate patient for possible emergent intervention.    Allergies: Patient has no known allergies.  Medications: Prior to Admission medications   Medication Sig Start Date End Date Taking? Authorizing Provider  atorvastatin (LIPITOR) 20 MG tablet Take 20 mg by mouth at bedtime.  08/25/17  Yes [provider]  isosorbide mononitrate (IMDUR) 30 MG 24 hr tablet Take 30 mg by mouth daily. 09/22/21  Yes [provider]  lactulose (CHRONULAC) 10 GM/15ML solution Take 30 mLs (20 g total) by mouth 3 (three) times daily. Goal is  2 bowel movements daily. 07/20/21  Yes [name], MD  levothyroxine (SYNTHROID) 75 MCG tablet Take 0.5 tablets (37.5 mcg total) by mouth daily before breakfast. 09/22/21  Yes Rehman, Mechele Dawley, MD  metoprolol tartrate (LOPRESSOR) 25 MG tablet Take 0.5 tablets (12.5 mg total) by mouth 2 (two) times daily. 08/10/21  Yes Chandrasekhar, [name], MD  Omega-3 Fatty Acids (OMEGA 3 500 PO) Take 500 mg by mouth at bedtime.   Yes [provider]  pantoprazole (PROTONIX) 40 MG tablet Take 1 tablet (40 mg total) by mouth 2 (two) times daily before a meal. 09/24/21  Yes Memon, Jolaine Artist, MD  rifaximin (XIFAXAN) 550 MG TABS tablet Take 1 tablet (550 mg total) by mouth 2 (two) times daily. Patient taking differently: Take 550 mg by mouth 2 (two) times daily. Continuously 08/12/20  Yes Rehman, Mechele Dawley, MD  zinc gluconate 50 MG tablet Take 50 mg by mouth daily.   Yes [provider]     Vital Signs: BP 95/67   Pulse 80   Temp (!) 96.3 F (35.7 C) (Axillary)   Resp 14   Ht 5' 8"  (1.727 m)   Wt 178 lb 5.6 oz (80.9 kg)   SpO2 100%   BMI 27.12 kg/m   Physical Exam Constitutional:      General: He is in acute distress.     Appearance: He is ill-appearing.  Pulmonary:     Comments: Agonal respiratory pattern    Imaging: DG Chest Port 1 View  Result Date: 10/06/2021 CLINICAL DATA:  Post blood transfusion, assess for edema. EXAM: PORTABLE CHEST 1 VIEW COMPARISON:  October 05, 2021. FINDINGS: EKG leads project over the chest. Cardiomediastinal contours and hilar structures are stable, cardiomediastinal contours and hilar  structures are stable and partially obscured by graded opacity in the RIGHT chest which is similar to previous imaging. Mild increased interstitial markings are present throughout the chest. On limited assessment there is no acute skeletal finding. IMPRESSION: 1. RIGHT-sided pleural effusion and interstitial prominence likely compatible with mild edema similar to  previous imaging. Electronically Signed   By: Zetta Bills M.D.   On: 10/06/2021 08:17   DG Chest Port 1 View  Result Date: 10/05/2021 CLINICAL DATA:  71 year old male with possible sepsis.  NSTEMI. EXAM: PORTABLE CHEST 1 VIEW COMPARISON:  Chest radiographs 07/16/2021. FINDINGS: Portable AP semi upright view at 0844 hours. Mildly rotated to the right. Low lung volumes stable since March. Mediastinal contours remain within normal limits. Moderate new veiling opacity in the right lung. No pneumothorax. Pulmonary vascularity seems stable, no definite edema. No definite left pleural effusion. No air bronchograms identified. Visualized tracheal air column is within normal limits. No acute osseous abnormality identified. Negative visible bowel gas pattern. IMPRESSION: Low lung volumes as seen in March with moderate new right lung veiling opacity favored to be new pleural effusion. No overt pulmonary edema. PA and lateral views of the chest would be helpful when feasible. Electronically Signed   By: Genevie Ann M.D.   On: 10/05/2021 08:54    Labs:  CBC: Recent Labs    10/05/21 1620 10/05/21 2138 10/06/21 0133 10/06/21 0943  WBC 9.1 10.1 10.0 8.4  HGB 7.1* 7.9* 7.6* 6.4*  HCT 22.7* 23.8* 23.2* 18.7*  PLT 61* 46* 41* 32*    COAGS: Recent Labs    09/24/21 0521 09/30/21 1025 10/05/21 0822 10/06/21 0133  INR 2.6* 2.4* 4.7* 3.4*  APTT  --   --  65*  --     BMP: Recent Labs    09/22/21 2100 09/23/21 0812 10/05/21 0822 10/06/21 0133  NA 128* 133* 129* 133*  K 4.0 4.1 4.4 4.2  CL 105 107 103 107  CO2 23 24 15* 16*  GLUCOSE 100* 79 148* 91  BUN 45* 46* 81* 74*  CALCIUM 8.1* 8.3* 8.6* 8.5*  CREATININE 1.54* 1.40* 2.66* 2.86*  GFRNONAA 48* 54* 25* 23*    LIVER FUNCTION TESTS: Recent Labs    09/22/21 2100 09/23/21 0812 10/05/21 0822 10/06/21 0133  BILITOT 4.1* 5.5* 4.7* 7.8*  AST 154* 147* 150* 1,454*  ALT 80* 75* 82* 667*  ALKPHOS 110 107 65 67  PROT 4.4* 4.2* <3.0* 4.1*   ALBUMIN <1.5* <1.5* <1.5* 2.0*    Assessment and Plan:  Decompensated NASH cirrhosis  The patient is currently experiencing multisystem organ failure and is critically ill with a poor prognosis. An attempt at a portal vein intervention would likely be futile with a high possibility of causing death during the procedure. Our findings and assessment were discussed at the bedside with the patient's family. Time was allowed for questions/concerns and the family acknowledged the information provided and stated they had no further questions.   IR remains available as needed.    Electronically Signed: Soyla Dryer, AGACNP-BC [id number] 10/06/2021, 1:57 PM   I spent a total of 25 Minutes at the the patient's bedside AND on the patient's hospital floor or unit, greater than 50% of which was counseling/coordinating care for cirrhosis.

## 2021-10-06 NOTE — H&P (View-Only) (Signed)
[facility] Consult: 9:39 AM 10/06/2021  LOS: 1 day    Referring Provider: CCM.  Dr Loanne Drilling.    Primary Care Physician:  Rogene Houston, MD Primary Gastroenterologist:  Dr [facility]     Reason for Consultation: Monitor.  Recent GI bleed.   HPI: Cody Austin is a 71 y.o. male.  PMH NASH related cirrhosis.  Non-STEMI 06/2021.  CAD, severe three-vessel disease not a candidate for CABG, not PCI candidate due to AKI.  Thrombocytopenia.  Coagulopathy.  Acute on chronic anemia..  Non-STEMI.  OSA on CPAP.  Hypothyroidism.  PAD.  Chronic, nonocclusive portal vein thrombus/stenosis.  Massive splenorenal shunt.  Hepatocellular carcinoma.  Hepatorenal syndrome.    08/2019 EGD: Screening procedure.  Normal esophagus.  Portal hypertensive gastropathy without bleeding.  Congested duodenal mucosa, no biopsies. 09/2020 EGD.  For repeat surveillance.  Grade 1 distal esophageal varices without stigmata or active bleeding.  Mild portal hypertensive gastropathy throughout the stomach.  09/23/2021 EGD for IDA, chronic blood loss.  Nonbleeding grade 1 esophageal varices.  Portal hypertensive gastropathy with bleeding/oozing of blood from antrum and prepyloric mucosa treated with Hemospray achieving hemostasis.  No interventions performed on the varices. 03/2021 Abd ultrasound: Cirrhosis.  No obvious hepatoma.  Limited PV evaluation likely secondary to retrograde flow via prominent splenorenal shunt.  Cholelithiasis.  Splenomegaly.  Perisplenic varicosities. 06/15/2021 MR abdomen: 12 mm hyperenhancing lesion in segment 7-8 of liver, probably hepatocellular carcinoma.  Focus of delayed enhancement in segment 4 with intermediate probability of malignancy.  Large volume ascites.  Splenomegaly.  Portosystemic collaterals.  Portal  colopathy/enteropathy.  Cirrhotic liver.  Diminutive portal vein, and large splenic and superior mesenteric veins all of these appear patent.  16 mm cystic lesion at head of pancreas/uncinate process stable compared with previous and likely reflects sidebranch IPMN. AFP 10.3 in early 08/2021.    Received 3 PRBCs in late March/early April to address Hb 7.2.  Iron/anemia panel did not confirm iron, B12, folate deficiency.  Platelets were 52 K, INR 2.7.  Also had AKI, NSTEMI.  Underwent diagnostic paracentesis, no evidence for SBP.  Started on lactulose and rifaximin for hepatic encephalopathy.  GI service did not pursue repeat EGD as no sign of active bleeding. Meld-NA score 27, MELD 26.  Child-Pugh C.  S/p several paracentesis since the initial tap of 3/20.  Initial volume removed was 3.7 L, subsequently removed around 1.7 L but at tap of 6/14 4.5 L removed. Not on diuretics.   Other complications include worsening renal function.   Referred to IR for TIPS evaluation.  Deemed not a liver transplant candidate due to CAD, recent MI. Seen by IR Dr. Serafina Royals on 6/15.  High risk for TIPS.  Discussed option for portal vein angioplasty, possible stent to improve antegrade flow via percutaneous or transjugular approach, the latter being safest given his coagulopathy, anemia.  Other possibilities discussed were concomitant embolization of splenorenal shunt.  MDs prediction was improving antegrade flow without creating a TIPS would improve liver function but may increase portal hypertension and increase ascites.  Eventual TIPS might be a possibility in future.  Also discussed was liver directed therapy for [facility] if liver function improved.  IR intervention scheduled for 6/23.  Seen at [facility] ED yesterday by GI service regarding Hb of 5, treated with 2 PRBCs.  Platelets 80.  INR 4.7.  He woke up that morning feeling weak and dyspneic.  Pt reported tarry, black stools and was FOBT positive.  Per EMS was hypotensive,  bradycardic at 38. No nausea, vomiting. Chest x-ray: Right-sided pleural effusion and interstitial prominence compatible with mild edema.  Protonix,octreotide drips initiated along with Levophed, albumin infusion x 3, Rocephin.  Transferred to [facility] for advanced care, hemodynamic instability requiring vasopressors.  Felt to be in hemorrhagic shock.  Has not required intubation.  CODE STATUS is DNR. However family hopeful and want exhaust available measures to address problems.    Today's labs sodium 133.  BUN/creatinine 74/2.8.  T. bili 7.8.  Alk phos 67.  AST/ALT 1454/667.  These are elevated c/w yesterday and previous weeks.  Ammonia 36.  Troponin 7131 Hgb 7.6.  MCV 101.  Platelets 41 K.  INR 3.4.  Current pressure on 09/63 with heart rate 78.  On nasal cannula oxygen.  IR has not yet seen patient.  RN reports overnight he has had a few melenic stools of small to moderate quantity.  Confirms no nausea or vomiting or abdominal pain.  Patient's son is a Industrial/product designer in [place], [place]. Patient does not drink.  Lives with his wife in [place].  Retired from [employer] job.  Past Medical History:  Diagnosis Date   AKI (acute kidney injury) ([place]) 07/17/2021   Arthritis    Coronary artery disease    Heart murmur    History of kidney stones    Hypothyroidism 07/17/2021   NASH Liver cirrhosis secondary to NASH (nonalcoholic steatohepatitis) 06/01/2019   NSTEMI (non-ST elevated myocardial infarction) ([place])    Other pancytopenia ([place]) 11/19/2014   Peripheral arterial disease ([place]) 05/28/2019   Sleep apnea    uses CPAP    Past Surgical History:  Procedure Laterality Date   CATARACT EXTRACTION W/PHACO Right 08/12/2014   Procedure: CATARACT EXTRACTION PHACO AND INTRAOCULAR LENS PLACEMENT ([place]);  Surgeon: [name], MD;  Location: [facility] ORS;  Service: Ophthalmology;  Laterality: Right;  CDE 9.32   CATARACT EXTRACTION W/PHACO Left 08/26/2014   Procedure: CATARACT  EXTRACTION PHACO AND INTRAOCULAR LENS PLACEMENT ([facility]);  Surgeon: [name], MD;  Location: [facility] ORS;  Service: Ophthalmology;  Laterality: Left;  CDE:  9.49   COLONOSCOPY N/A 12/13/2014   Procedure: COLONOSCOPY;  Surgeon: Rogene Houston, MD;  Location: [facility] ENDO SUITE;  Service: Endoscopy;  Laterality: N/A;  100   ESOPHAGEAL BANDING N/A 08/31/2019   Procedure: ESOPHAGEAL BANDING;  Surgeon: Rogene Houston, MD;  Location: [facility] END[facility] SU[facility];  Service: Endoscopy;  Laterality: N/A;   ESOPHAGOGASTRODUODENOSCOPY N/A 12/13/2014   Procedure: ESOPHAGOGASTRODUODENOSCOPY (EGD);  Surgeon: Rogene Houston, MD;  Location: [facility] SU[facility];  Service: Endoscopy;  Laterality: N/A;   ESOPHAGOGASTRODUODENOSCOPY (EGD) WITH PROPOFOL N/A 08/31/2019   Procedure: ESOPHAGOGASTRODUODENOSCOPY (EGD) WITH PROPOFOL;  Surgeon: Rogene Houston, MD;  Location: [facility];  Service: Endoscopy;  Laterality: N/A;  1200   ESOPHAGOGASTRODUODENOSCOPY (EGD) WITH PROPOFOL N/A 10/01/2020   Procedure: ESOPHAGOGASTRODUODENOSCOPY (EGD) WITH PROPOFOL;  Surgeon: Rogene Houston, MD;  Location: [facility];  Service: Endoscopy;  Laterality: N/A;  10:00   ESOPHAGOGASTRODUODENOSCOPY (EGD) WITH PROPOFOL N/A 09/23/2021   Procedure: ESOPHAGOGASTRODUODENOSCOPY (EGD)  WITH PROPOFOL;  Surgeon: Rogene Houston, MD;  Location: [facility];  Service: Endoscopy;  Laterality: N/A;   HERNIA REPAIR Right    Inguinal hernia   TONSILLECTOMY      Prior to Admission medications   Medication Sig Start Date End Date Taking? Authorizing Provider  atorvastatin (LIPITOR) 20 MG tablet Take 20 mg by mouth at bedtime.  08/25/17  Yes [provider]  isosorbide mononitrate (IMDUR) 30 MG 24 hr tablet Take 30 mg by mouth daily. 09/22/21  Yes [provider]  lactulose (CHRONULAC) 10 GM/15ML solution Take 30 mLs (20 g total) by mouth 3 (three) times daily. Goal is 2 bowel movements daily. 07/20/21  Yes [name], MD  levothyroxine (SYNTHROID) 75 MCG  tablet Take 0.5 tablets (37.5 mcg total) by mouth daily before breakfast. 09/22/21  Yes Rehman, Mechele Dawley, MD  metoprolol tartrate (LOPRESSOR) 25 MG tablet Take 0.5 tablets (12.5 mg total) by mouth 2 (two) times daily. 08/10/21  Yes Chandrasekhar, [name], MD  Omega-3 Fatty Acids (OMEGA 3 500 PO) Take 500 mg by mouth at bedtime.   Yes [provider]  pantoprazole (PROTONIX) 40 MG tablet Take 1 tablet (40 mg total) by mouth 2 (two) times daily before a meal. 09/24/21  Yes Memon, Jolaine Artist, MD  rifaximin (XIFAXAN) 550 MG TABS tablet Take 1 tablet (550 mg total) by mouth 2 (two) times daily. Patient taking differently: Take 550 mg by mouth 2 (two) times daily. Continuously 08/12/20  Yes Rehman, Mechele Dawley, MD  zinc gluconate 50 MG tablet Take 50 mg by mouth daily.   Yes [provider]    Scheduled Meds:  sodium chloride   Intravenous Once   sodium chloride   Intravenous Once   Chlorhexidine Gluconate Cloth  6 each Topical Q0600   lactulose  300 mL Rectal BID   mouth rinse  15 mL Mouth Rinse 4 times per day   rifaximin  550 mg Oral BID   Infusions:  sodium chloride Stopped (10/05/21 1929)   cefTRIAXone (ROCEPHIN)  IV     dextrose     norepinephrine (LEVOPHED) Adult infusion 5 mcg/min (10/06/21 0800)   octreotide (SANDOSTATIN) 500 mcg in sodium chloride 0.9 % 250 mL (2 mcg/mL) infusion 50 mcg/hr (10/06/21 0600)   pantoprazole 8 mg/hr (10/06/21 0600)   phytonadione (VITAMIN K) 10 mg in dextrose 5 % 50 mL IVPB     PRN Meds: ondansetron (ZOFRAN) IV, mouth rinse   Allergies as of 10/05/2021   (No Known Allergies)    Family History  Problem Relation Age of Onset   Cancer Father    Aneurysm Father     Social History   Socioeconomic History   Marital status: Married    Spouse name: Not on file   Number of children: Not on file   Years of education: Not on file   Highest education level: Not on file  Occupational History   Not on file  Tobacco Use   Smoking status:  Never    Passive exposure: Never   Smokeless tobacco: Never  Vaping Use   Vaping Use: Never used  Substance and Sexual Activity   Alcohol use: Not Currently   Drug use: No   Sexual activity: Yes  Other Topics Concern   Not on file  Social History Narrative   Not on file   Social Determinants of Health   Financial Resource Strain: Not on file  Food Insecurity: Not on file  Transportation Needs:  Not on file  Physical Activity: Not on file  Stress: Not on file  Social Connections: Not on file  Intimate Partner Violence: Not on file    REVIEW OF SYSTEMS: Constitutional: Some weakness.  No profound fatigue. ENT:  No nose bleeds.  Patient denies oral bleeding or taste of blood in his mouth Pulm: Dyspnea without cough CV:  No palpitations, no LE edema.  No angina. GU:  No hematuria, no frequency GI: See HPI. Heme: Bruises easily but other than the melena, no unusual bleeding or bruising Transfusions: See HPI. Neuro:  No headaches, no peripheral tingling or numbness.  No syncope, no seizures Derm:  No itching, no rash or sores.  Endocrine:  No sweats or chills.  No polyuria or dysuria Immunization: un Known. Travel:  None beyond local counties in last few months.    PHYSICAL EXAM: Vital signs in last 24 hours: Vitals:   10/06/21 0645 10/06/21 0740  BP: (!) 104/50   Pulse: 79   Resp: 17   Temp:  (!) 96 F (35.6 C)  SpO2: 100%    Wt Readings from Last 3 Encounters:  10/06/21 80.9 kg  09/28/21 99.5 kg  09/22/21 99.5 kg    General: Obese, acutely, chronically ill-appearing.  Resting comfortably on bed. Head: No signs of head trauma or facial asymmetry. Eyes: Conjunctiva pink.  No icterus. Ears: No obvious hearing deficit Nose: No congestion or discharge Mouth: Dry oral mucosa.  No blood in the mouth.  Fair dentition.  Tongue midline.  Lips with lining of dark, looking like dried blood. Neck: No masses.  No JVD Lungs: Diminished breath sounds.  Some increased work  of breathing.  No cough. Heart: RRR.  No MRG.  S1, S2 present Abdomen: Tense, protuberant.  Not tender.  Bowel sounds present.  Do not appreciate organomegaly, hernias, bruits.   Rectal: Deferred. Musc/Skeltl: No obvious joint deformities Extremities: Pitting pedal edema, mild. Neurologic: Mild asterixis.  Alert.  Oriented to place, year, self, situation.  Psychomotor slowing but answers questions appropriately.  Moves all 4 limbs, strength not tested. Skin: No obvious sores, rashes or suspicious lesions Nodes: No cervical adenopathy Psych: Flat affect, cooperative.  Not agitated  Intake/Output from previous day: 06/19 0701 - 06/20 0700 In: 11252.9 [I.V.:5167.8; Blood:2470; IV Piggyback:3595.1] Out: 102 [Urine:102] Intake/Output this shift: No intake/output data recorded.  LAB RESULTS: Recent Labs    10/05/21 1620 10/05/21 2138 10/06/21 0133  WBC 9.1 10.1 10.0  HGB 7.1* 7.9* 7.6*  HCT 22.7* 23.8* 23.2*  PLT 61* 46* 41*   BMET Lab Results  Component Value Date   NA 133 (L) 10/06/2021   NA 129 (L) 10/05/2021   NA 133 (L) 09/23/2021   K 4.2 10/06/2021   K 4.4 10/05/2021   K 4.1 09/23/2021   CL 107 10/06/2021   CL 103 10/05/2021   CL 107 09/23/2021   CO2 16 (L) 10/06/2021   CO2 15 (L) 10/05/2021   CO2 24 09/23/2021   GLUCOSE 91 10/06/2021   GLUCOSE 148 (H) 10/05/2021   GLUCOSE 79 09/23/2021   BUN 74 (H) 10/06/2021   BUN 81 (H) 10/05/2021   BUN 46 (H) 09/23/2021   CREATININE 2.86 (H) 10/06/2021   CREATININE 2.66 (H) 10/05/2021   CREATININE 1.40 (H) 09/23/2021   CALCIUM 8.5 (L) 10/06/2021   CALCIUM 8.6 (L) 10/05/2021   CALCIUM 8.3 (L) 09/23/2021   LFT Recent Labs    10/05/21 0822 10/06/21 0133  PROT <3.0* 4.1*  ALBUMIN <1.5* 2.0*  AST 150* 1,454*  ALT 82* 667*  ALKPHOS 65 67  BILITOT 4.7* 7.8*   PT/INR Lab Results  Component Value Date   INR 3.4 (H) 10/06/2021   INR 4.7 (HH) 10/05/2021   INR 2.4 (H) 09/30/2021   Hepatitis Panel No results for  input(s): "HEPBSAG", "HCVAB", "HEPAIGM", "HEPBIGM" in the last 72 hours. C-Diff No components found for: "CDIFF" Lipase  No results found for: "LIPASE"  Drugs of Abuse     Component Value Date/Time   LABOPIA NONE DETECTED 05/28/2019 1759   COCAINSCRNUR NONE DETECTED 05/28/2019 1759   LABBENZ NONE DETECTED 05/28/2019 1759   AMPHETMU NONE DETECTED 05/28/2019 1759   THCU NONE DETECTED 05/28/2019 1759   LABBARB NONE DETECTED 05/28/2019 1759     RADIOLOGY STUDIES: DG Chest Port 1 View  Result Date: 10/06/2021 CLINICAL DATA:  Post blood transfusion, assess for edema. EXAM: PORTABLE CHEST 1 VIEW COMPARISON:  October 05, 2021. FINDINGS: EKG leads project over the chest. Cardiomediastinal contours and hilar structures are stable, cardiomediastinal contours and hilar structures are stable and partially obscured by graded opacity in the RIGHT chest which is similar to previous imaging. Mild increased interstitial markings are present throughout the chest. On limited assessment there is no acute skeletal finding. IMPRESSION: 1. RIGHT-sided pleural effusion and interstitial prominence likely compatible with mild edema similar to previous imaging. Electronically Signed   By: Zetta Bills M.D.   On: 10/06/2021 08:17   DG Chest Port 1 View  Result Date: 10/05/2021 CLINICAL DATA:  71 year old male with possible sepsis.  NSTEMI. EXAM: PORTABLE CHEST 1 VIEW COMPARISON:  Chest radiographs 07/16/2021. FINDINGS: Portable AP semi upright view at 0844 hours. Mildly rotated to the right. Low lung volumes stable since March. Mediastinal contours remain within normal limits. Moderate new veiling opacity in the right lung. No pneumothorax. Pulmonary vascularity seems stable, no definite edema. No definite left pleural effusion. No air bronchograms identified. Visualized tracheal air column is within normal limits. No acute osseous abnormality identified. Negative visible bowel gas pattern. IMPRESSION: Low lung volumes as  seen in March with moderate new right lung veiling opacity favored to be new pleural effusion. No overt pulmonary edema. PA and lateral views of the chest would be helpful when feasible. Electronically Signed   By: Genevie Ann M.D.   On: 10/05/2021 08:54      IMPRESSION:   Ongoing GI bleeding likely from portal hypertensive gastropathy in setting of coagulopathy, thrombocytopenia due to advanced/decompensated cirrhosis.  Latest EGD on 6/7 again confirmed nonbleeding, grade 1 distal esophageal varices.  Active bleeding/oozing from portal hypertensive gastropathy was treated with Hemospray.  Acute on chronic blood loss anemia.  Received 5 PRBCs as far, 2 at [facility] prior to transfer.  Previous transfusions for same within last few months.    Decompensated [place] cirrhosis.  HCCA, likely based on previous MRI as recently as 05/2021.  Ascites being managed with paracentesis every 10 to 14 days.  IR, Dr. Serafina Royals planned intervention 6/30.  Per review of his 6/15 consult note possibilities for intervention included portal vein angioplasty and possible PV stenting, splenorenal shunt embolization.  TIPS was not considered at that time but might be a possibility in future if liver function improved.  Chronic, nonocclusive PV thrombosis/stenosis.  Thrombocytopenia.  Coagulopathy.  Received 2 PRBCs yesterday evening.  Got 10 mg vitamin K this morning.  AKI.  GFR 23.    Hx HE.  Compliant with rifaximin, Chronulac.  Current ammonia is 36.  Three-vessel CAD, not  a candidate for stenting or CABG.  Note acutely elevated troponin, suspect element of shock  Hyponatremia.   PLAN:     Spoke with the patient's daughter who was also on speaker phone with patient's wife.  Daughter is Susa Griffins [phone].  The plan after discussing with Dr. Lorenso Courier is to attempt to raise platelet level to 50 or greater with platelet transfusion.  If and when platelets are 50 or better, will proceed with upper  endoscopy.  This could be this afternoon.  Daughter expressed agreement/approval with plan.  Proceed with 2 unit platelet transfusion and recheck platelet levels after completion.  3 doses of vitamin K in addition to platelet transfusion.  CCM has also ordered additional doses of vitamin K for next 2 days.   Azucena Freed  10/06/2021, 9:39 AM Phone [phone]

## 2021-10-06 NOTE — Progress Notes (Signed)
PHARMACY - PHYSICIAN COMMUNICATION CRITICAL VALUE ALERT - BLOOD CULTURE IDENTIFICATION (BCID)  Cody Austin is an 71 y.o. male who presented to [facility] on 10/05/2021   Assessment:  Kle[place] pneumoniae bacteremia (no resistance detected)  Name of physician (or Provider) Contacted: Dr. Mauri Brooklyn ([facility]/CCM)  Current antibiotics: Cefepime  Changes to prescribed antibiotics recommended:  Per Dr. Tamala Julian: Change Cefepime back to Ceftriaxone 2g IV q24h  Results for orders placed or performed during the hospital encounter of 10/05/21  Blood Culture ID Panel (Reflexed) (Collected: 10/05/2021  8:22 AM)  Result Value Ref Range   Enterococcus faecalis NOT DETECTED NOT DETECTED   Enterococcus Faecium NOT DETECTED NOT DETECTED   Listeria monocytogenes NOT DETECTED NOT DETECTED   Staphylococcus species NOT DETECTED NOT DETECTED   Staphylococcus aureus (BCID) NOT DETECTED NOT DETECTED   Staphylococcus epidermidis NOT DETECTED NOT DETECTED   Staphylococcus lugdunensis NOT DETECTED NOT DETECTED   Streptococcus species NOT DETECTED NOT DETECTED   Streptococcus agalactiae NOT DETECTED NOT DETECTED   Streptococcus pneumoniae NOT DETECTED NOT DETECTED   Streptococcus pyogenes NOT DETECTED NOT DETECTED   A.calcoaceticus-baumannii NOT DETECTED NOT DETECTED   Bacteroides fragilis NOT DETECTED NOT DETECTED   Enterobacterales DETECTED (A) NOT DETECTED   Enterobacter cloacae complex NOT DETECTED NOT DETECTED   Escherichia coli NOT DETECTED NOT DETECTED   Klebsiella aerogenes NOT DETECTED NOT DETECTED   Klebsiella oxytoca NOT DETECTED NOT DETECTED   Klebsiella pneumoniae DETECTED (A) NOT DETECTED   Proteus species NOT DETECTED NOT DETECTED   Salmonella species NOT DETECTED NOT DETECTED   Serratia marcescens NOT DETECTED NOT DETECTED   Haemophilus influenzae NOT DETECTED NOT DETECTED   Neisseria meningitidis NOT DETECTED NOT DETECTED   Pseudomonas aeruginosa NOT DETECTED NOT DETECTED   Stenotrophomonas  maltophilia NOT DETECTED NOT DETECTED   Candida albicans NOT DETECTED NOT DETECTED   Candida auris NOT DETECTED NOT DETECTED   Candida glabrata NOT DETECTED NOT DETECTED   Candida krusei NOT DETECTED NOT DETECTED   Candida parapsilosis NOT DETECTED NOT DETECTED   Candida tropicalis NOT DETECTED NOT DETECTED   Cryptococcus neoformans/gattii NOT DETECTED NOT DETECTED   CTX-M ESBL NOT DETECTED NOT DETECTED   Carbapenem resistance IMP NOT DETECTED NOT DETECTED   Carbapenem resistance KPC NOT DETECTED NOT DETECTED   Carbapenem resistance NDM NOT DETECTED NOT DETECTED   Carbapenem resist OXA 48 LIKE NOT DETECTED NOT DETECTED   Carbapenem resistance VIM NOT DETECTED NOT DETECTED    Narda Bonds 10/06/2021  12:12 AM

## 2021-10-06 NOTE — Progress Notes (Signed)
  Echocardiogram 2D Echocardiogram has been performed.  Cody Austin 10/06/2021, 4:53 PM

## 2021-10-07 ENCOUNTER — Encounter (HOSPITAL_COMMUNITY): Payer: Self-pay | Admitting: Certified Registered Nurse Anesthetist

## 2021-10-07 ENCOUNTER — Encounter (HOSPITAL_COMMUNITY): Payer: Self-pay | Admitting: Internal Medicine

## 2021-10-07 ENCOUNTER — Inpatient Hospital Stay (HOSPITAL_COMMUNITY): Payer: Medicare Other

## 2021-10-07 ENCOUNTER — Encounter (HOSPITAL_COMMUNITY)
Admission: EM | Disposition: A | Discharge status: Hospice | Payer: Self-pay | Source: Home / Self Care | Attending: Pulmonary Disease

## 2021-10-07 DIAGNOSIS — R188 Other ascites: Secondary | ICD-10-CM | POA: Diagnosis not present

## 2021-10-07 DIAGNOSIS — K766 Portal hypertension: Secondary | ICD-10-CM | POA: Diagnosis not present

## 2021-10-07 DIAGNOSIS — K922 Gastrointestinal hemorrhage, unspecified: Secondary | ICD-10-CM | POA: Diagnosis not present

## 2021-10-07 DIAGNOSIS — N179 Acute kidney failure, unspecified: Secondary | ICD-10-CM | POA: Diagnosis not present

## 2021-10-07 DIAGNOSIS — K921 Melena: Secondary | ICD-10-CM | POA: Diagnosis not present

## 2021-10-07 HISTORY — PX: HEMOSTASIS CONTROL: SHX6838

## 2021-10-07 HISTORY — PX: ESOPHAGOGASTRODUODENOSCOPY (EGD) WITH PROPOFOL: SHX5813

## 2021-10-07 LAB — PREPARE PLATELET PHERESIS
Unit division: 0
Unit division: 0

## 2021-10-07 LAB — CULTURE, BLOOD (ROUTINE X 2)

## 2021-10-07 LAB — CBC
HCT: 20.8 % — ABNORMAL LOW (ref 39.0–52.0)
HCT: 20.7 % — ABNORMAL LOW (ref 39.0–52.0)
HCT: 27.6 % — ABNORMAL LOW (ref 39.0–52.0)
Hemoglobin: 6.8 g/dL — CL (ref 13.0–17.0)
Hemoglobin: 7.1 g/dL — ABNORMAL LOW (ref 13.0–17.0)
Hemoglobin: 9.4 g/dL — ABNORMAL LOW (ref 13.0–17.0)
MCH: 31.8 pg (ref 26.0–34.0)
MCH: 32.3 pg (ref 26.0–34.0)
MCH: 32.3 pg (ref 26.0–34.0)
MCHC: 32.7 g/dL (ref 30.0–36.0)
MCHC: 34.3 g/dL (ref 30.0–36.0)
MCHC: 34.1 g/dL (ref 30.0–36.0)
MCV: 97.2 fL (ref 80.0–100.0)
MCV: 94.1 fL (ref 80.0–100.0)
MCV: 94.8 fL (ref 80.0–100.0)
Platelets: 36 10*3/uL — ABNORMAL LOW (ref 150–400)
Platelets: 43 10*3/uL — ABNORMAL LOW (ref 150–400)
Platelets: 68 10*3/uL — ABNORMAL LOW (ref 150–400)
RBC: 2.14 MIL/uL — ABNORMAL LOW (ref 4.22–5.81)
RBC: 2.2 MIL/uL — ABNORMAL LOW (ref 4.22–5.81)
RBC: 2.91 MIL/uL — ABNORMAL LOW (ref 4.22–5.81)
RDW: 21.1 % — ABNORMAL HIGH (ref 11.5–15.5)
RDW: 20.8 % — ABNORMAL HIGH (ref 11.5–15.5)
RDW: 20.5 % — ABNORMAL HIGH (ref 11.5–15.5)
WBC: 5.9 10*3/uL (ref 4.0–10.5)
WBC: 6.5 10*3/uL (ref 4.0–10.5)
WBC: 16.9 10*3/uL — ABNORMAL HIGH (ref 4.0–10.5)
nRBC: 0 % (ref 0.0–0.2)
nRBC: 0 % (ref 0.0–0.2)
nRBC: 0 % (ref 0.0–0.2)

## 2021-10-07 LAB — DIC (DISSEMINATED INTRAVASCULAR COAGULATION)PANEL
D-Dimer, Quant: 6.68 ug/mL-FEU — ABNORMAL HIGH (ref 0.00–0.50)
Fibrinogen: 120 mg/dL — ABNORMAL LOW (ref 210–475)
INR: 3.4 — ABNORMAL HIGH (ref 0.8–1.2)
Platelets: 40 10*3/uL — ABNORMAL LOW (ref 150–400)
Prothrombin Time: 33.7 seconds — ABNORMAL HIGH (ref 11.4–15.2)
Smear Review: NONE SEEN
aPTT: 53 seconds — ABNORMAL HIGH (ref 24–36)

## 2021-10-07 LAB — BPAM PLATELET PHERESIS
Blood Product Expiration Date: 202306222359
Blood Product Expiration Date: 202306222359
ISSUE DATE / TIME: 202306201257
ISSUE DATE / TIME: 202306201356
Unit Type and Rh: 5100
Unit Type and Rh: 7300

## 2021-10-07 LAB — PROTIME-INR
INR: 5.1 (ref 0.8–1.2)
Prothrombin Time: 46.7 seconds — ABNORMAL HIGH (ref 11.4–15.2)

## 2021-10-07 LAB — COMPREHENSIVE METABOLIC PANEL
ALT: 706 U/L — ABNORMAL HIGH (ref 0–44)
AST: 1336 U/L — ABNORMAL HIGH (ref 15–41)
Albumin: 2.5 g/dL — ABNORMAL LOW (ref 3.5–5.0)
Alkaline Phosphatase: 60 U/L (ref 38–126)
Anion gap: 13 (ref 5–15)
BUN: 82 mg/dL — ABNORMAL HIGH (ref 8–23)
CO2: 16 mmol/L — ABNORMAL LOW (ref 22–32)
Calcium: 7.9 mg/dL — ABNORMAL LOW (ref 8.9–10.3)
Chloride: 103 mmol/L (ref 98–111)
Creatinine, Ser: 3.05 mg/dL — ABNORMAL HIGH (ref 0.61–1.24)
GFR, Estimated: 21 mL/min — ABNORMAL LOW (ref >60)
Glucose, Bld: 104 mg/dL — ABNORMAL HIGH (ref 70–99)
Potassium: 4.1 mmol/L (ref 3.5–5.1)
Sodium: 132 mmol/L — ABNORMAL LOW (ref 135–145)
Total Bilirubin: 8.6 mg/dL — ABNORMAL HIGH (ref 0.3–1.2)
Total Protein: 4.1 g/dL — ABNORMAL LOW (ref 6.5–8.1)

## 2021-10-07 LAB — PREPARE RBC (CROSSMATCH)

## 2021-10-07 LAB — GLUCOSE, CAPILLARY
Glucose-Capillary: 82 mg/dL (ref 70–99)
Glucose-Capillary: 86 mg/dL (ref 70–99)
Glucose-Capillary: 91 mg/dL (ref 70–99)
Glucose-Capillary: 96 mg/dL (ref 70–99)
Glucose-Capillary: 97 mg/dL (ref 70–99)
Glucose-Capillary: 97 mg/dL (ref 70–99)

## 2021-10-07 LAB — LACTIC ACID, PLASMA: Lactic Acid, Venous: 2.8 mmol/L (ref 0.5–1.9)

## 2021-10-07 SURGERY — ESOPHAGOGASTRODUODENOSCOPY (EGD) WITH PROPOFOL
Anesthesia: Moderate Sedation

## 2021-10-07 MED ORDER — SODIUM CHLORIDE 0.9% IV SOLUTION: Freq: Once | INTRAVENOUS | Status: AC

## 2021-10-07 MED ORDER — CEFAZOLIN SODIUM-DEXTROSE 2-4 GM/100ML-% IV SOLN
2.0000 g | Freq: Two times a day (BID) | INTRAVENOUS | Status: DC
  Administered 2021-10-07 – 2021-10-09 (×4): 2 g via INTRAVENOUS
  Filled 2021-10-07 (×6): qty 100

## 2021-10-07 MED ORDER — MIDAZOLAM HCL 2 MG/2ML IJ SOLN
1.0000 mg | Freq: Once | INTRAMUSCULAR | Status: AC
  Administered 2021-10-07: 1 mg via INTRAVENOUS
  Filled 2021-10-07: qty 2

## 2021-10-07 MED ORDER — ETOMIDATE 2 MG/ML IV SOLN
20.0000 mg | Freq: Once | INTRAVENOUS | Status: AC
  Administered 2021-10-07: 20 mg via INTRAVENOUS
  Filled 2021-10-07: qty 10

## 2021-10-07 MED ORDER — EPINEPHRINE 1 MG/10ML IJ SOSY
PREFILLED_SYRINGE | INTRAMUSCULAR | Status: AC
  Filled 2021-10-07: qty 20

## 2021-10-07 MED ORDER — MIDAZOLAM HCL (PF) 5 MG/ML IJ SOLN
INTRAMUSCULAR | Status: AC
  Filled 2021-10-07: qty 2

## 2021-10-07 MED ORDER — ROCURONIUM BROMIDE 50 MG/5ML IV SOLN
50.0000 mg | Freq: Once | INTRAVENOUS | Status: AC
  Administered 2021-10-07: 50 mg via INTRAVENOUS
  Filled 2021-10-07: qty 5

## 2021-10-07 MED ORDER — PROPOFOL 1000 MG/100ML IV EMUL
0.0000 ug/kg/min | INTRAVENOUS | Status: DC
  Administered 2021-10-07: 50 ug/kg/min via INTRAVENOUS
  Filled 2021-10-07: qty 100

## 2021-10-07 MED ORDER — PANTOPRAZOLE SODIUM 40 MG IV SOLR
40.0000 mg | Freq: Two times a day (BID) | INTRAVENOUS | Status: DC
  Administered 2021-10-07 – 2021-10-16 (×17): 40 mg via INTRAVENOUS
  Filled 2021-10-07 (×17): qty 10

## 2021-10-07 MED ORDER — POLYETHYLENE GLYCOL 3350 17 G PO PACK: 17.0000 g | PACK | Freq: Every day | ORAL | Status: DC

## 2021-10-07 MED ORDER — PHENYLEPHRINE 80 MCG/ML (10ML) SYRINGE FOR IV PUSH (FOR BLOOD PRESSURE SUPPORT)
80.0000 ug | PREFILLED_SYRINGE | Freq: Once | INTRAVENOUS | Status: AC | PRN
  Administered 2021-10-07: 160 ug via INTRAVENOUS

## 2021-10-07 MED ORDER — PANTOPRAZOLE INFUSION (NEW) - SIMPLE MED
8.0000 mg/h | INTRAVENOUS | Status: AC
  Filled 2021-10-07: qty 100

## 2021-10-07 MED ORDER — FENTANYL 2500MCG IN NS 250ML (10MCG/ML) PREMIX INFUSION
0.0000 ug/h | INTRAVENOUS | Status: DC
  Administered 2021-10-07: 25 ug/h via INTRAVENOUS
  Filled 2021-10-07 (×2): qty 250

## 2021-10-07 MED ORDER — FUROSEMIDE 10 MG/ML IJ SOLN
40.0000 mg | Freq: Once | INTRAMUSCULAR | Status: AC
  Administered 2021-10-07: 40 mg via INTRAVENOUS
  Filled 2021-10-07: qty 4

## 2021-10-07 MED ORDER — FENTANYL CITRATE (PF) 100 MCG/2ML IJ SOLN
100.0000 ug | Freq: Once | INTRAMUSCULAR | Status: AC
  Administered 2021-10-07: 100 ug via INTRAVENOUS
  Filled 2021-10-07: qty 2

## 2021-10-07 MED ORDER — DOCUSATE SODIUM 50 MG/5ML PO LIQD
100.0000 mg | Freq: Two times a day (BID) | ORAL | Status: DC
Start: 2021-10-07 — End: 2021-10-07

## 2021-10-07 MED ORDER — FUROSEMIDE 10 MG/ML IJ SOLN
40.0000 mg | Freq: Once | INTRAMUSCULAR | Status: AC
Start: 2021-10-07 — End: 2021-10-07
  Administered 2021-10-07: 40 mg via INTRAVENOUS
  Filled 2021-10-07: qty 4

## 2021-10-07 MED ORDER — SODIUM CHLORIDE 0.9% IV SOLUTION
Freq: Once | INTRAVENOUS | Status: AC
Start: 2021-10-07 — End: 2021-10-07

## 2021-10-07 MED ORDER — FENTANYL CITRATE (PF) 100 MCG/2ML IJ SOLN
INTRAMUSCULAR | Status: AC
  Filled 2021-10-07: qty 4

## 2021-10-07 SURGICAL SUPPLY — 15 items

## 2021-10-07 NOTE — Progress Notes (Signed)
[facility] Progress Note Patient Name: Cody Austin DOB: Feb 03, 1951 MRN: [id number]   Date of Service  10/07/2021  HPI/Events of Note  Hgb 6.8, plts 36, INR 5.1 Suspect ongoing bleeding  eICU Interventions  Transfuse 1 unit PRBC, 1 unit platelets, and 2 u FFP     Intervention Category Intermediate Interventions: Bleeding - evaluation and treatment with blood products  Mauri Brooklyn, P 10/07/2021, 3:09 AM

## 2021-10-07 NOTE — Progress Notes (Signed)
Patient continues to have melena and drops in his hemoglobin.  Despite platelet transfusions, the patient's platelet count has not improved significantly.  His last platelet check was 43.  At this time patient is in a difficult situation since it has been difficult to be able to get his platelets at adequate level (plts >50) to be able to perform his EGD procedure despite repeated platelet transfusions.  It is also unclear to me whether or not the EGD procedure would provide substantial benefit since his last EGD a few weeks ago showed bleeding from [place] that was treated with Hemospray, which is only a temporizing measure that would last for about 1 to 2 days.  There is no other endoscopic therapy available for treating bleeding due to [place] except for Hemospray.  I discussed the risks and benefits of performing an EGD with the patient and his family (daughter and wife) at bedside.  Since his platelets are below 50, patient would be at risk for increased bleeding due to the procedure.  Daughter conferred with the patient and the rest of her family, and they would like to proceed with the EGD procedure despite the increased risk of bleeding due to thrombocytopenia and the unclear long-term benefit of endoscopy.  I told this to his ICU attending Dr. Loanne Drilling who will plan to run platelets during the course of his EGD procedure.  Patient's fibrinogen was also slightly low so we will start infusing cryoglobulin as well.  Patient will be intubated by the ICU team prior to his EGD procedure.

## 2021-10-07 NOTE — Progress Notes (Signed)
NAME:  Cody Austin, MRN:  [id number], DOB:  08-07-1950, LOS: 2 ADMISSION DATE:  10/05/2021, CONSULTATION DATE:  10/05/21 REFERRING MD:  [facility], CHIEF COMPLAINT:  Hemorrhagic shock   History of Present Illness:  71 yowm NCB pt  with NASH cirrhosis  receiving paracentesis approximately every 2 weeks yielding 4.5 most recently on 09/30/21 with w/u c/w main portal vein stenosis likely secondary to chronic non-occlusive thrombus, a massive splenorenal shunt, advanced cirrhosis, hepatocellular carcinoma, hepatorenal syndrome, and hyperbilirubinemia.  He was recently admitted to [facility] with acute anemia and melena, to complicate his case further, and underwent EGD (09/23/21) which showed grade 1 esophageal varices and moderate diffuse portal hypertensive gastropathy with antral bleeding that was controlled with hemostatic spray.  He required blood transfusion during this admission.    Presented am 6/19 to [facility] ER with weakness, confusion  and low cbg/ hgb and PCCM consulted re transfer to [facility] where he is scheduled to undergo attempting portal vein angioplasty/stenting and shunt embolization on 5/63 with main complication = florid terminal liver failure per IR's note 10/01/21 and pt agreed as alternative to repeat massive paracentesis.   Pertinent  Medical History  NASh cirrhosis complicated by recurrent ascites, portal vein stenosis secondary to chronic nonocclusive thrombus, splenorenal shunt, hepatocellular carcinoma, hepatorenal syndrome, grade 1 varices, portal hypertensive gastropathy, OSA  Significant Hospital Events: Including procedures, antibiotic start and stop dates in addition to other pertinent events   6/19 Presented to [facility] and transferred [facility]   Interim History / Subjective:  Weaned off levophed Continues to have dropping Hg Overnight transfused PRBC x 2, plt x 1, FFP x 2 Reports abdominal tightness  Objective   Blood pressure 115/78, pulse 79, temperature 97.9 F (36.6 C),  temperature source Axillary, resp. rate 15, height 5' 8"  (1.727 m), weight 80.9 kg, SpO2 99 %.        Intake/Output Summary (Last 24 hours) at 10/07/2021 0922 Last data filed at 10/07/2021 0911 Gross per 24 hour  Intake 5038.84 ml  Output 510 ml  Net 4528.84 ml   Filed Weights   10/05/21 0814 10/06/21 0500  Weight: 80.9 kg 80.9 kg   Physical Exam: General: Chronically ill-appearing, no acute distress HENT: [place], AT, OP clear, MMM Eyes: EOMI, no scleral icterus Respiratory: Clear to auscultation bilaterally.  No crackles, wheezing or rales Cardiovascular: RRR, -M/R/G, no JVD GI: distant BS+, distended abdomen, mild TTP Extremities: 2+ edema up to the thighs,-tenderness Neuro: AAO x4, CNII-XII grossly intact GU: Foley in place   Resolved Hospital Problem list     Assessment & Plan:   Hemorrhagic shock/acute on chronic blood loss anemia - ddx includes esophageal varices, portal hypertensive gastropathy bleed, ulcer Elevated INR - worsening --Off pressors --S/p PRBC x3 FFP x2, vitamin K 10 mg x1 --S/p additional PRBC x 2, plt x 1, FFP x 2 on 6/21 --S/p albumin 50g x 3 --GI consulted. Will plan for EGD if plt >50k --Continue PPI --Continue Octreotide --Trend CBC, INR --Hold antihypertensives --Trend LA  Decompensated NASH cirrhosis with recurrent ascites Possible HCC - not a candidate for chemo or TACE Hepatorenal syndrome LFTs elevated but stable today Followed by GI [place]. Not a transplant candidate due to CADx3 Previously evaluated by IR for portal vein thrombus. Not a candidate for TIPS but considering stent Hx of paracentesis x 5 in the last two months --Unable to safely perform paracentesis in setting of elevated INR --Continue vitamin K x 3 days (end 6/22) --Lactulose enema. Hold  Xifaxan while NPO --Lasix due to volume of blood products --Trend CMET --Foley  Klebsiella pneumonia bacteremia --De-escalate to Ancef based on culture sensitivities for 10-14  day course (6/19>) --Follow-up blood cultures for clearance  NSTEMI in setting of anemia and shock CAD Severe aortic stenosis Trop 3734>28. Not a candidate for intervention with acute illness Echo EF preserved. Elevated RVSP in setting of liver failure  AKI on CKD IIIa Metabolic acidosis --Monitor UOP/Cr --Avoid nephrotoxic agents  Hypoglycemia --D20 gtt  Hypothyroidism --Restart when able to take PO  OSA --Nightly CPAP  Best Practice (right click and "Reselect all SmartList Selections" daily)   Diet/type: NPO DVT prophylaxis: not indicated bleed GI prophylaxis: PPI Lines: N/A Foley:  Yes, and it is still needed Code Status:  DNR Last date of multidisciplinary goals of care discussion [6/19] DNR. Discussed with wife and daughter. Continue current care   Critical care time: 70 min   The patient is critically ill with multiple organ systems failure and requires high complexity decision making for assessment and support, frequent evaluation and titration of therapies, application of advanced monitoring technologies and extensive interpretation of multiple databases.    Rodman Pickle, M.D. [facility] Pulmonary/Critical Care Medicine 10/07/2021 9:23 AM   Please see Amion for pager number to reach on-call Pulmonary and Critical Care Team.

## 2021-10-07 NOTE — Procedures (Signed)
Extubation Procedure Note  Patient Details:   Name: CALLAHAN WILD DOB: Nov 19, 1950 MRN: [id number]   Airway Documentation:    Vent end date: 10/07/21 Vent end time: 1731   Evaluation  O2 sats: stable throughout Complications: No apparent complications Patient did tolerate procedure well. Bilateral Breath Sounds: Clear, Diminished   No, pt could not speak post extubation.  Pt extubated to .50 vm per physician order.  Earney Navy 10/07/2021, 5:32 PM

## 2021-10-07 NOTE — Consult Note (Signed)
   [facility] CM Inpatient Consult   10/07/2021  MATEJ SAPPENFIELD 04-13-1951 [id number]  [place] [facility] [ACO] Patient: Medicare ACO REACH  Primary Care Provider:  Rogene Houston, MD, Gastroenterology [specialist]  Referral:  Readmission review QI request  Patient screened for hospitalization with noted extreme high risk score for unplanned readmission risk and for less than 30 days readmission to assess for potential [place] Management service needs for post hospital transition.  Review of patient's medical record reveals patient is currently in ICU level of care.   Plan:  Updates sent for review. Continue to follow progress and disposition to assess for post hospital care management needs.    For questions contact:   Natividad Brood, RN BSN [facility] Liaison  [phone] business mobile phone Toll free office [phone]  Fax number: [phone] [email or web address]

## 2021-10-07 NOTE — Progress Notes (Signed)
PCCM Progress Note  Coordinated care with GI. Will intubate for airway protection prior to EGD. Discussed with family risks of intubation in his critically ill status with multiorgan failure. With his ongoing hemoglobin drops and active bleed, patient/family expresses understanding and wishes to proceed. The goal is peri-procedure ventilation however may need longer vent needs if any complications arise. Daughter states he would not want prolonged intubation. PCCM and GI will update family after EGD completed.  CC time: 20 min

## 2021-10-07 NOTE — Op Note (Signed)
[facility] Patient Name: Cody Austin Procedure Date : 10/07/2021 MRN: [id number] Attending MD: Georgian Co ,  Date of Birth: 1951/03/28 CSN: [id number] Age: 71 Admit Type: Inpatient Procedure:                Upper GI endoscopy Indications:              Melena, anemia Providers:                Adline Mango" Marcell Barlow,                            Technician, Jaci Carrel, RN Referring MD:             Hospitalist team Medicines:                Monitored Anesthesia Care Complications:            No immediate complications. Estimated Blood Loss:     Estimated blood loss was minimal. Procedure:                Pre-Anesthesia Assessment:                           - Prior to the procedure, a History and Physical                            was performed, and patient medications and                            allergies were reviewed. The patient's tolerance of                            previous anesthesia was also reviewed. The risks                            and benefits of the procedure and the sedation                            options and risks were discussed with the patient.                            All questions were answered, and informed consent                            was obtained. Prior Anticoagulants: The patient has                            taken no previous anticoagulant or antiplatelet                            agents. ASA Grade Assessment: III - A patient with                            severe systemic disease. After reviewing the risks  and benefits, the patient was deemed in                            satisfactory condition to undergo the procedure.                           After obtaining informed consent, the endoscope was                            passed under direct vision. Throughout the                            procedure, the patient's blood pressure, pulse, and                             oxygen saturations were monitored continuously. The                            [id number] ([id number]) Therapeutic endoscope was                            introduced through the mouth, and advanced to the                            second part of duodenum. The upper GI endoscopy was                            accomplished without difficulty. The patient                            tolerated the procedure well. Scope In: Scope Out: Findings:      The examined esophagus was normal.      Clotted blood was found in the gastric body.      A few localized erosions with no bleeding and no stigmata of recent       bleeding were found in the gastric body.      Portal hypertensive gastropathy/gastritis was found in the gastric body       and in the gastric antrum. There was active reaccumulation of blood in       the gastric antrum. To stop active bleeding, hemostatic spray was       deployed. Two sprays were applied.      The examined duodenum was normal. Impression:               - Normal esophagus.                           - Clotted blood in the gastric body.                           - Erosive gastropathy with no bleeding and no                            stigmata of recent bleeding.                           -  Portal hypertensive gastropathy/gastritis.                            Hemostatic spray applied.                           - Normal examined duodenum.                           - No specimens collected. Recommendation:           - Continue ICU care.                           - It is likely that the patient's melena is coming                            from PHG/gastritis in the gastric antrum. No focal                            lesion or GAVE was noted so targeted therapy was                            not possible. Hopefullly with administration of                            hemospray patient will have a brief reprieve in                            bleeding.                            - Use a proton pump inhibitor IV BID.                           - Okay to discontinue octreotide gtt since there                            are no signs of variceal bleeding.                           - Check serum H pylori antibody.                           - Would transfuse to maintain plts >50 and                            fibrinogen >120.                           - The findings and recommendations were discussed                            with the patient's family and ICU team. Procedure Code(s):        --- Professional ---  43255, Esophagogastroduodenoscopy, flexible,                            transoral; with control of bleeding, any method Diagnosis Code(s):        --- Professional ---                           K92.2, Gastrointestinal hemorrhage, unspecified                           K31.89, Other diseases of stomach and duodenum                           K76.6, Portal hypertension                           K92.1, Melena (includes Hematochezia) CPT copyright 2019 American Medical Association. All rights reserved. The codes documented in this report are preliminary and upon coder review may  be revised to meet current compliance requirements. Sonny Masters "Christia Reading,  10/07/2021 5:09:01 PM Number of Addenda: 0

## 2021-10-07 NOTE — Interval H&P Note (Signed)
History and Physical Interval Note:  10/07/2021 3:44 PM  Cody Austin  has presented today for surgery, with the diagnosis of Tarry stools.  Recurrent acute on chronic blood loss anemia.  Known portal hypertensive gastropathy bleeding in past.  Thrombocytopenia.  Coagulopathy..  The various methods of treatment have been discussed with the patient and family. After consideration of risks, benefits and other options for treatment, the patient has consented to  Procedure(s): ESOPHAGOGASTRODUODENOSCOPY (EGD) WITH PROPOFOL (N/A) as a surgical intervention.  The patient's history has been reviewed, patient examined, no change in status, stable for surgery.  I have reviewed the patient's chart and labs.  Questions were answered to the patient's satisfaction.     [name]

## 2021-10-07 NOTE — Procedures (Signed)
Intubation Procedure Note  TYLIEK TIMBERMAN  [id number]  12/30/1950  Date:10/07/21  Time:1:41 PM   Provider Performing:Lamon Rotundo Rodman Pickle, MD   Procedure: Intubation (75797)  Indication(s) Respiratory Failure  Consent Risks of the procedure as well as the alternatives and risks of each were explained to the patient and/or caregiver.  Consent for the procedure was obtained and is signed in the bedside chart   Anesthesia Etomidate, Versed, Fentanyl, and Rocuronium   Time Out Verified patient identification, verified procedure, site/side was marked, verified correct patient position, special equipment/implants available, medications/allergies/relevant history reviewed, required imaging and test results available.   Sterile Technique Usual hand hygeine, masks, and gloves were used   Procedure Description Patient positioned in bed supine.  Sedation given as noted above.  Patient was intubated with endotracheal tube using Glidescope.  View was Grade 1 full glottis .  Number of attempts was 1.  Colorimetric CO2 detector was consistent with tracheal placement.   Complications/Tolerance None; patient tolerated the procedure well. Chest X-ray is ordered to verify placement.   EBL Minimal   Specimen(s) None

## 2021-10-08 ENCOUNTER — Encounter (HOSPITAL_COMMUNITY): Payer: Self-pay | Admitting: Internal Medicine

## 2021-10-08 ENCOUNTER — Other Ambulatory Visit (HOSPITAL_COMMUNITY): Payer: Self-pay

## 2021-10-08 ENCOUNTER — Encounter (HOSPITAL_COMMUNITY): Payer: Self-pay | Admitting: Interventional Radiology

## 2021-10-08 DIAGNOSIS — K7460 Unspecified cirrhosis of liver: Secondary | ICD-10-CM | POA: Diagnosis not present

## 2021-10-08 DIAGNOSIS — N179 Acute kidney failure, unspecified: Secondary | ICD-10-CM | POA: Diagnosis not present

## 2021-10-08 DIAGNOSIS — R188 Other ascites: Secondary | ICD-10-CM | POA: Diagnosis not present

## 2021-10-08 DIAGNOSIS — K922 Gastrointestinal hemorrhage, unspecified: Secondary | ICD-10-CM | POA: Diagnosis not present

## 2021-10-08 LAB — BPAM PLATELET PHERESIS
Blood Product Expiration Date: 202306222359
Blood Product Expiration Date: 202306222359
ISSUE DATE / TIME: 202306210549
ISSUE DATE / TIME: 202306211252
Unit Type and Rh: 7300
Unit Type and Rh: 7300

## 2021-10-08 LAB — CBC
HCT: 24.2 % — ABNORMAL LOW (ref 39.0–52.0)
HCT: 25.2 % — ABNORMAL LOW (ref 39.0–52.0)
Hemoglobin: 8 g/dL — ABNORMAL LOW (ref 13.0–17.0)
Hemoglobin: 8.6 g/dL — ABNORMAL LOW (ref 13.0–17.0)
MCH: 31 pg (ref 26.0–34.0)
MCH: 32 pg (ref 26.0–34.0)
MCHC: 33.1 g/dL (ref 30.0–36.0)
MCHC: 34.1 g/dL (ref 30.0–36.0)
MCV: 93.8 fL (ref 80.0–100.0)
MCV: 93.7 fL (ref 80.0–100.0)
Platelets: 55 10*3/uL — ABNORMAL LOW (ref 150–400)
Platelets: 52 10*3/uL — ABNORMAL LOW (ref 150–400)
RBC: 2.58 MIL/uL — ABNORMAL LOW (ref 4.22–5.81)
RBC: 2.69 MIL/uL — ABNORMAL LOW (ref 4.22–5.81)
RDW: 21.2 % — ABNORMAL HIGH (ref 11.5–15.5)
RDW: 21.3 % — ABNORMAL HIGH (ref 11.5–15.5)
WBC: 11 10*3/uL — ABNORMAL HIGH (ref 4.0–10.5)
WBC: 12.5 10*3/uL — ABNORMAL HIGH (ref 4.0–10.5)
nRBC: 0 % (ref 0.0–0.2)
nRBC: 0.2 % (ref 0.0–0.2)

## 2021-10-08 LAB — CBC WITH DIFFERENTIAL/PLATELET
Abs Immature Granulocytes: 0 10*3/uL (ref 0.00–0.07)
Basophils Absolute: 0 10*3/uL (ref 0.0–0.1)
Basophils Relative: 0 %
Eosinophils Absolute: 0 10*3/uL (ref 0.0–0.5)
Eosinophils Relative: 0 %
HCT: 25.1 % — ABNORMAL LOW (ref 39.0–52.0)
Hemoglobin: 8.4 g/dL — ABNORMAL LOW (ref 13.0–17.0)
Lymphocytes Relative: 0 %
Lymphs Abs: 0 10*3/uL — ABNORMAL LOW (ref 0.7–4.0)
MCH: 31.7 pg (ref 26.0–34.0)
MCHC: 33.5 g/dL (ref 30.0–36.0)
MCV: 94.7 fL (ref 80.0–100.0)
Monocytes Absolute: 0.3 10*3/uL (ref 0.1–1.0)
Monocytes Relative: 3 %
Neutro Abs: 10.3 10*3/uL — ABNORMAL HIGH (ref 1.7–7.7)
Neutrophils Relative %: 97 %
Platelets: 43 10*3/uL — ABNORMAL LOW (ref 150–400)
RBC: 2.65 MIL/uL — ABNORMAL LOW (ref 4.22–5.81)
RDW: 21.2 % — ABNORMAL HIGH (ref 11.5–15.5)
WBC: 10.6 10*3/uL — ABNORMAL HIGH (ref 4.0–10.5)
nRBC: 0 % (ref 0.0–0.2)
nRBC: 0 /100 WBC

## 2021-10-08 LAB — BPAM FFP
Blood Product Expiration Date: 202306222359
Blood Product Expiration Date: 202306222359
ISSUE DATE / TIME: 202306210745
ISSUE DATE / TIME: 202306210745
Unit Type and Rh: 6200
Unit Type and Rh: 6200

## 2021-10-08 LAB — PREPARE PLATELET PHERESIS
Unit division: 0
Unit division: 0

## 2021-10-08 LAB — PREPARE FRESH FROZEN PLASMA

## 2021-10-08 LAB — PREPARE CRYOPRECIPITATE: Unit division: 0

## 2021-10-08 LAB — COMPREHENSIVE METABOLIC PANEL
ALT: 528 U/L — ABNORMAL HIGH (ref 0–44)
AST: 841 U/L — ABNORMAL HIGH (ref 15–41)
Albumin: 2.4 g/dL — ABNORMAL LOW (ref 3.5–5.0)
Alkaline Phosphatase: 62 U/L (ref 38–126)
Anion gap: 14 (ref 5–15)
BUN: 90 mg/dL — ABNORMAL HIGH (ref 8–23)
CO2: 18 mmol/L — ABNORMAL LOW (ref 22–32)
Calcium: 7.5 mg/dL — ABNORMAL LOW (ref 8.9–10.3)
Chloride: 104 mmol/L (ref 98–111)
Creatinine, Ser: 2.97 mg/dL — ABNORMAL HIGH (ref 0.61–1.24)
GFR, Estimated: 22 mL/min — ABNORMAL LOW (ref >60)
Glucose, Bld: 96 mg/dL (ref 70–99)
Potassium: 4 mmol/L (ref 3.5–5.1)
Sodium: 136 mmol/L (ref 135–145)
Total Bilirubin: 10.3 mg/dL — ABNORMAL HIGH (ref 0.3–1.2)
Total Protein: 3.9 g/dL — ABNORMAL LOW (ref 6.5–8.1)

## 2021-10-08 LAB — GLUCOSE, CAPILLARY
Glucose-Capillary: 94 mg/dL (ref 70–99)
Glucose-Capillary: 90 mg/dL (ref 70–99)
Glucose-Capillary: 85 mg/dL (ref 70–99)
Glucose-Capillary: 77 mg/dL (ref 70–99)
Glucose-Capillary: 79 mg/dL (ref 70–99)

## 2021-10-08 LAB — BPAM CRYOPRECIPITATE
Blood Product Expiration Date: 202306211836
ISSUE DATE / TIME: 202306211445
Unit Type and Rh: 6200

## 2021-10-08 LAB — DIC (DISSEMINATED INTRAVASCULAR COAGULATION)PANEL
D-Dimer, Quant: 7.68 ug/mL-FEU — ABNORMAL HIGH (ref 0.00–0.50)
Fibrinogen: 158 mg/dL — ABNORMAL LOW (ref 210–475)
INR: 3.8 — ABNORMAL HIGH (ref 0.8–1.2)
Platelets: 46 10*3/uL — ABNORMAL LOW (ref 150–400)
Prothrombin Time: 37 seconds — ABNORMAL HIGH (ref 11.4–15.2)
Smear Review: NONE SEEN
aPTT: 53 seconds — ABNORMAL HIGH (ref 24–36)

## 2021-10-08 LAB — H. PYLORI ANTIBODY, IGG: H Pylori IgG: 0.25 Index Value (ref 0.00–0.79)

## 2021-10-08 LAB — D-DIMER, QUANTITATIVE: D-Dimer, Quant: 7.56 ug/mL-FEU — ABNORMAL HIGH (ref 0.00–0.50)

## 2021-10-08 LAB — LACTIC ACID, PLASMA: Lactic Acid, Venous: 2 mmol/L (ref 0.5–1.9)

## 2021-10-08 MED ORDER — SODIUM CHLORIDE 0.9% IV SOLUTION: Freq: Once | INTRAVENOUS | Status: DC

## 2021-10-08 MED ORDER — FUROSEMIDE 10 MG/ML IJ SOLN
40.0000 mg | Freq: Two times a day (BID) | INTRAMUSCULAR | Status: AC
  Administered 2021-10-08 (×2): 40 mg via INTRAVENOUS
  Filled 2021-10-08 (×2): qty 4

## 2021-10-08 MED ORDER — RIFAXIMIN 550 MG PO TABS: 550.0000 mg | ORAL_TABLET | Freq: Two times a day (BID) | ORAL | Status: DC

## 2021-10-08 MED ORDER — SODIUM CHLORIDE 0.9 % IV SOLN
2.0000 g | INTRAVENOUS | Status: AC
  Administered 2021-10-09: 2 g via INTRAVENOUS
  Filled 2021-10-08: qty 20

## 2021-10-08 MED ORDER — DEXTROSE 5 % IV SOLN
10.0000 mg | Freq: Once | INTRAVENOUS | Status: AC
  Administered 2021-10-08: 10 mg via INTRAVENOUS
  Filled 2021-10-08: qty 1

## 2021-10-08 NOTE — Progress Notes (Signed)
eLink Physician-Brief Progress Note Patient Name: Cody Austin DOB: 10/02/1950 MRN: [id number]   Date of Service  10/08/2021  HPI/Events of Note  Bedside RN requesting Flexiseal.  eICU Interventions  Flexiseal contraindicated due to INR > 3.0 and platelet count 55 K. Transfuse platelet pack for platelets < 50 K. Will order AM coagulation parameters, CBC, CMP.        Kerry Kass Samyia Motter 10/08/2021, 8:25 PM

## 2021-10-08 NOTE — Consult Note (Cosign Needed Addendum)
Chief Complaint: Patient was seen in consultation today for  intervention for portal hypertension with recurrent ascites and GI bleed  Chief Complaint  Patient presents with   Altered Mental Status   at the request of Hildred Laser MD  Referring Physician(s): Hildred Laser MD  Supervising Physician: Ruthann Cancer  Patient Status: [facility] - In-pt  History of Present Illness: COLESON KANT is a 71 y.o. male with PMHs of CAD, PAD, unifocal hepatocellular carcinoma in segment VIII, NASH cirrhosis and recurrent ascites, esophageal varices, recent GI bleeding from moderate diffuse portal hypertensive gastropathy with antral bleeding s/p hemostatic spray on 09/23/21, who is well known to Dr. Serafina Royals.   Dr. Serafina Royals met with Mr. Letts and his family members on 10/01/21 and discussed possible treatment options for the recurrent ascites due to NASH cirrhosis. After thorough discussion and shared decision making, patient decided to proceed with  transjugular, transhepatic portal venogram with possible portal vein angioplasty, possible portal vein stent placement, in addition to splenorenal shunt coil embolization.   On 6/19, patient was brought to [facility] ED due to AMS and weakness, found to have hgb of 5.0 with hypotension, patient was transferred to [facility] ICU for hemorrhagic shock/acute on chronic blood loss anemia. IR was consulted on 6/20 to evaluate patient for possible emergent intervention; it was determine that no emergent intervention is indicated as attempting a portal vein intervention would likely be futile with a high possibility of causing death during the procedure.   Since 6/20, patient was able to weaned off from levophed but showed persistent dropping in hgb, underwent EGD on 6/21 which showed no active or stigma of bleeding. Hemostatic spay was applied due to portal hypertensive gastropathy/gastritis.   Dr. Serafina Royals was contacted by the critical care team today for possible intervention to  manage patient's recurrent UGIB which is most likely caused by portal hypertensive gastropathy.   Patient seen in ICU room.  Sitting up in bed, NAD. Asks for ice chips.  He was able to state that he is at [facility], but was not able to state date or current [place] president.  When asked if he remembers having discussion with Dr. Serafina Royals for possible treatment for his "liver vein," he states that he remembers the discussion and he is still interested. Informed the patient that this PA will discuss with his family members as well, and if everybody is in agreement, will schedule him for the procedure tomorrow morning.  Patient verbalized understanding.   The procedure was discussed via telephone with spouse Mrs. Vena Austria and daughter Miss Susa Griffins, and there is an agreement to proceed.    Past Medical History:  Diagnosis Date   AKI (acute kidney injury) ([place]) 07/17/2021   Arthritis    Coronary artery disease    Heart murmur    History of kidney stones    Hypothyroidism 07/17/2021   NASH Liver cirrhosis secondary to NASH (nonalcoholic steatohepatitis) 06/01/2019   NSTEMI (non-ST elevated myocardial infarction) ([place])    Other pancytopenia ([place]) 11/19/2014   Peripheral arterial disease ([place]) 05/28/2019   Sleep apnea    uses CPAP    Past Surgical History:  Procedure Laterality Date   CATARACT EXTRACTION W/PHACO Right 08/12/2014   Procedure: CATARACT EXTRACTION PHACO AND INTRAOCULAR LENS PLACEMENT ([place]);  Surgeon: [name], MD;  Location: [facility] OR[facility];  Service: Ophthalmology;  Laterality: Right;  CDE 9.32   CATARACT EXTRACTION W/PHACO Left 08/26/2014   Procedure: CATARACT EXTRACTION PHACO AND INTRAOCULAR LENS PLACEMENT (I[facility]);  Surgeon: [name], MD;  Location: [facility];  Service: Ophthalmology;  Laterality: Left;  CDE:  9.49   COLONOSCOPY N/A 12/13/2014   Procedure: COLONOSCOPY;  Surgeon: Rogene Houston, MD;  Location: [facility];  Service: Endoscopy;  Laterality: N/A;  100    ESOPHAGEAL BANDING N/A 08/31/2019   Procedure: ESOPHAGEAL BANDING;  Surgeon: Rogene Houston, MD;  Location: [facility];  Service: Endoscopy;  Laterality: N/A;   ESOPHAGOGASTRODUODENOSCOPY N/A 12/13/2014   Procedure: ESOPHAGOGASTRODUODENOSCOPY (EGD);  Surgeon: Rogene Houston, MD;  Location: [facility];  Service: Endoscopy;  Laterality: N/A;   ESOPHAGOGASTRODUODENOSCOPY (EGD) WITH PROPOFOL N/A 08/31/2019   Procedure: ESOPHAGOGASTRODUODENOSCOPY (EGD) WITH PROPOFOL;  Surgeon: Rogene Houston, MD;  Location: [facility];  Service: Endoscopy;  Laterality: N/A;  1200   ESOPHAGOGASTRODUODENOSCOPY (EGD) WITH PROPOFOL N/A 10/01/2020   Procedure: ESOPHAGOGASTRODUODENOSCOPY (EGD) WITH PROPOFOL;  Surgeon: Rogene Houston, MD;  [facility];  Service: Endoscopy;  Laterality: N/A;  10:00   ESOPHAGOGASTRODUODENOSCOPY (EGD) WITH PROPOFOL N/A 09/23/2021   Procedure: ESOPHAGOGASTRODUODENOSCOPY (EGD) WITH PROPOFOL;  Surgeon: Rogene Houston, MD;  [facility];  Service: Endoscopy;  Laterality: N/A;   ESOPHAGOGASTRODUODENOSCOPY (EGD) WITH PROPOFOL N/A 10/07/2021   Procedure: ESOPHAGOGASTRODUODENOSCOPY (EGD) WITH PROPOFOL;  Surgeon: [name], MD;  Location: [place];  Service: Gastroenterology;  Laterality: N/A;   HEMOSTASIS CONTROL  10/07/2021   Procedure: HEMOSTASIS CONTROL;  Surgeon: [name], MD;  Location: [facility] ENDOSCOPY;  Service: Gastroenterology;;   HERNIA REPAIR Right    Inguinal hernia   TONSILLECTOMY      Allergies: Patient has no known allergies.  Medications: Prior to Admission medications   Medication Sig Start Date End Date Taking? Authorizing Provider  atorvastatin (LIPITOR) 20 MG tablet Take 20 mg by mouth at bedtime.  08/25/17  Yes [provider]  isosorbide mononitrate (IMDUR) 30 MG 24 hr tablet Take 30 mg by mouth daily. 09/22/21  Yes [provider]  lactulose (CHRONULAC) 10 GM/15ML solution Take 30 mLs (20 g total) by mouth 3  (three) times daily. Goal is 2 bowel movements daily. 07/20/21  Yes [name], MD  levothyroxine (SYNTHROID) 75 MCG tablet Take 0.5 tablets (37.5 mcg total) by mouth daily before breakfast. 09/22/21  Yes Rehman, Mechele Dawley, MD  metoprolol tartrate (LOPRESSOR) 25 MG tablet Take 0.5 tablets (12.5 mg total) by mouth 2 (two) times daily. 08/10/21  Yes Chandrasekhar, [name], MD  Omega-3 Fatty Acids (OMEGA 3 500 PO) Take 500 mg by mouth at bedtime.   Yes [provider]  pantoprazole (PROTONIX) 40 MG tablet Take 1 tablet (40 mg total) by mouth 2 (two) times daily before a meal. 09/24/21  Yes Memon, Jolaine Artist, MD  rifaximin (XIFAXAN) 550 MG TABS tablet Take 1 tablet (550 mg total) by mouth 2 (two) times daily. Patient taking differently: Take 550 mg by mouth 2 (two) times daily. Continuously 08/12/20  Yes Rehman, Mechele Dawley, MD  zinc gluconate 50 MG tablet Take 50 mg by mouth daily.   Yes [provider]     Family History  Problem Relation Age of Onset   Cancer Father    Aneurysm Father     Social History   Socioeconomic History   Marital status: Married    Spouse name: Not on file   Number of children: Not on file   Years of education: Not on file   Highest education level: Not on file  Occupational History   Not  on file  Tobacco Use   Smoking status: Never    Passive exposure: Never   Smokeless tobacco: Never  Vaping Use   Vaping Use: Never used  Substance and Sexual Activity   Alcohol use: Not Currently   Drug use: No   Sexual activity: Yes  Other Topics Concern   Not on file  Social History Narrative   Not on file   Social Determinants of Health   Financial Resource Strain: Not on file  Food Insecurity: Not on file  Transportation Needs: Not on file  Physical Activity: Not on file  Stress: Not on file  Social Connections: Not on file     Review of Systems: A 12 point ROS discussed and pertinent positives are indicated in the HPI above.  All  other systems are negative.  Vital Signs: BP 98/77   Pulse 78   Temp (!) 97.5 F (36.4 C) (Oral)   Resp 10   Ht 5' 8" (1.727 m)   Wt 178 lb 5.6 oz (80.9 kg)   SpO2 100%   BMI 27.12 kg/m    Physical Exam Vitals reviewed.  Constitutional:      General: He is not in acute distress.    Comments: A/O to self and place only   HENT:     Head: Normocephalic.     Mouth/Throat:     Mouth: Mucous membranes are dry.     Pharynx: Oropharynx is clear.  Eyes:     Comments: Scleral icterus   Cardiovascular:     Rate and Rhythm: Normal rate and regular rhythm.     Heart sounds: Normal heart sounds.  Pulmonary:     Effort: Pulmonary effort is normal.     Breath sounds: Normal breath sounds.  Abdominal:     General: Abdomen is flat. Bowel sounds are normal.     Palpations: Abdomen is soft.  Musculoskeletal:     Cervical back: Neck supple.  Skin:    General: Skin is warm and dry.     Coloration: Skin is not jaundiced.  Neurological:     Mental Status: He is alert.     Comments: A/O to self and place   Psychiatric:        Behavior: Behavior normal.     MD Evaluation Airway: WNL Heart: WNL Abdomen: WNL Chest/ Lungs: WNL ASA  Classification: 3 Mallampati/Airway Score: Two  Imaging: DG CHEST PORT 1 VIEW  Result Date: 10/07/2021 CLINICAL DATA:  Intubation. EXAM: PORTABLE CHEST 1 VIEW COMPARISON:  Radiographs 10/06/2021 and 10/05/2021. FINDINGS: 1401 hours. Interval intubation. Tip of the endotracheal tube is above the carina which is not well defined. The heart size and mediastinal contours are stable with aortic atherosclerosis. Persistent vascular congestion and asymmetric veiling opacity on the right, likely due to a right pleural effusion. No evidence of pneumothorax. Degenerative changes throughout the spine. Telemetry leads overlie the chest. IMPRESSION: 1. Satisfactory position of the endotracheal tube. 2. Vascular congestion with probable asymmetric right pleural effusion,  as before. Electronically Signed   By: William  Veazey M.D.   On: 10/07/2021 14:10   ECHOCARDIOGRAM COMPLETE  Result Date: 10/06/2021    ECHOCARDIOGRAM REPORT   Patient Name:   Jenkins L Rider Date of Exam: 10/06/2021 Medical Rec #:  [id number]      Height:       68.0 in Accession #:    [id number]     Weight:       178.4 lb Date of Birth:  11/03/1950         BSA:          1.947 m Patient Age:    73 years       BP:           101/59 mmHg Patient Gender: M              HR:           82 bpm. Exam Location:  Inpatient Procedure: 2D Echo, Cardiac Doppler and Color Doppler Indications:    NSTEMI  History:        Patient has prior history of Echocardiogram examinations, most                 recent 04/09/2021. Aortic Valve Disease; Risk                 Factors:Dyslipidemia and Sleep Apnea. Pre-diabetes, murmur.  Sonographer:    Clayton Lefort RDCS (AE) Referring Phys: [id number] [facility] Rodman Pickle  Sonographer Comments: Patient unresponsive to IVC collapse test. IMPRESSIONS  1. Left ventricular ejection fraction, by estimation, is 60 to 65%. The left ventricle has normal function. The left ventricle has no regional wall motion abnormalities. There is moderate concentric left ventricular hypertrophy. Left ventricular diastolic parameters are consistent with Grade III diastolic dysfunction (restrictive).  2. Right ventricular systolic function is normal. The right ventricular size is normal. There is mildly elevated pulmonary artery systolic pressure. The estimated right ventricular systolic pressure is 96.7 mmHg.  3. Left atrial size was moderately dilated.  4. The mitral valve is abnormal. Mild to moderate mitral valve regurgitation. No evidence of mitral stenosis.  5. The aortic valve is tricuspid. There is mild calcification of the aortic valve. There is moderate thickening of the aortic valve. Aortic valve regurgitation is not visualized. Mild to moderate aortic valve stenosis. Aortic valve area, by VTI measures  1.39 cm. Aortic  valve mean gradient measures 22.0 mmHg.  6. The inferior vena cava is dilated in size with <50% respiratory variability, suggesting right atrial pressure of 15 mmHg. Comparison(s): Increase in mitral regurgitation and tricuspid regurgitation form prior. Similar aortic valve gradients. FINDINGS  Left Ventricle: Left ventricular ejection fraction, by estimation, is 60 to 65%. The left ventricle has normal function. The left ventricle has no regional wall motion abnormalities. The left ventricular internal cavity size was normal in size. There is  moderate concentric left ventricular hypertrophy. Left ventricular diastolic parameters are consistent with Grade III diastolic dysfunction (restrictive). Right Ventricle: The right ventricular size is normal. No increase in right ventricular wall thickness. Right ventricular systolic function is normal. There is mildly elevated pulmonary artery systolic pressure. The tricuspid regurgitant velocity is 2.50  m/s, and with an assumed right atrial pressure of 15 mmHg, the estimated right ventricular systolic pressure is 59.1 mmHg. Left Atrium: Left atrial size was moderately dilated. Right Atrium: Right atrial size was normal in size. Prominent Eustachian valve. Pericardium: Trivial pericardial effusion is present. Mitral Valve: The mitral valve is abnormal. Mild to moderate mitral valve regurgitation. No evidence of mitral valve stenosis. MV peak gradient, 15.7 mmHg. The mean mitral valve gradient is 6.0 mmHg. Tricuspid Valve: The tricuspid valve is grossly normal. Tricuspid valve regurgitation is mild . No evidence of tricuspid stenosis. Aortic Valve: The aortic valve is tricuspid. There is mild calcification of the aortic valve. There is moderate thickening of the aortic valve. There is mild aortic valve annular calcification. Aortic valve regurgitation is not visualized. Mild to moderate aortic stenosis is present. Aortic valve mean gradient measures  22.0 mmHg. Aortic valve  peak gradient measures 39.9 mmHg. Aortic valve area, by VTI measures 1.39 cm. Pulmonic Valve: The pulmonic valve was grossly normal. Pulmonic valve regurgitation is not visualized. No evidence of pulmonic stenosis. Aorta: The aortic root and ascending aorta are structurally normal, with no evidence of dilitation. Venous: The inferior vena cava is dilated in size with less than 50% respiratory variability, suggesting right atrial pressure of 15 mmHg. IAS/Shunts: No atrial level shunt detected by color flow Doppler.  LEFT VENTRICLE PLAX 2D LVIDd:         4.90 cm   Diastology LVIDs:         3.80 cm   LV e' medial:    12.60 cm/s LV PW:         1.50 cm   LV E/e' medial:  14.2 LV IVS:        1.50 cm   LV e' lateral:   14.50 cm/s LVOT diam:     2.00 cm   LV E/e' lateral: 12.3 LV SV:         94 LV SV Index:   48 LVOT Area:     3.14 cm  RIGHT VENTRICLE RV Basal diam:  2.90 cm RV S prime:     15.20 cm/s TAPSE (M-mode): 3.2 cm LEFT ATRIUM           Index        RIGHT ATRIUM           Index LA diam:      5.20 cm 2.67 cm/m   RA Area:     18.30 cm LA Vol (A2C): 73.1 ml 37.55 ml/m  RA Volume:   48.70 ml  25.02 ml/m LA Vol (A4C): 90.3 ml 46.39 ml/m  AORTIC VALVE AV Area (Vmax):    1.39 cm AV Area (Vmean):   1.32 cm AV Area (VTI):     1.39 cm AV Vmax:           316.00 cm/s AV Vmean:          221.000 cm/s AV VTI:            0.675 m AV Peak Grad:      39.9 mmHg AV Mean Grad:      22.0 mmHg LVOT Vmax:         140.00 cm/s LVOT Vmean:        92.800 cm/s LVOT VTI:          0.299 m LVOT/AV VTI ratio: 0.44  AORTA Ao Root diam: 3.30 cm Ao Asc diam:  3.10 cm MITRAL VALVE                  TRICUSPID VALVE MV Area (PHT): 3.19 cm       TR Peak grad:   25.0 mmHg MV Area VTI:   2.02 cm       TR Vmax:        250.00 cm/s MV Peak grad:  15.7 mmHg MV Mean grad:  6.0 mmHg       SHUNTS MV Vmax:       1.98 m/s       Systemic VTI:  0.30 m MV Vmean:      106.0 cm/s     Systemic Diam: 2.00 cm MV Decel Time: 238 msec MR Peak grad:    95.6 mmHg MR  Mean grad:    66.0 mmHg MR Vmax:         489.00 cm/s MR Vmean:          379.0 cm/s MR PISA:         2.26 cm MR PISA Eff ROA: 18 mm MR PISA Radius:  0.60 cm MV E velocity: 179.00 cm/s Rudean Haskell MD Electronically signed by Rudean Haskell MD Signature Date/Time: 10/06/2021/5:31:14 PM    Final    DG Chest Port 1 View  Result Date: 10/06/2021 CLINICAL DATA:  Post blood transfusion, assess for edema. EXAM: PORTABLE CHEST 1 VIEW COMPARISON:  October 05, 2021. FINDINGS: EKG leads project over the chest. Cardiomediastinal contours and hilar structures are stable, cardiomediastinal contours and hilar structures are stable and partially obscured by graded opacity in the RIGHT chest which is similar to previous imaging. Mild increased interstitial markings are present throughout the chest. On limited assessment there is no acute skeletal finding. IMPRESSION: 1. RIGHT-sided pleural effusion and interstitial prominence likely compatible with mild edema similar to previous imaging. Electronically Signed   By: Zetta Bills M.D.   On: 10/06/2021 08:17   DG Chest Port 1 View  Result Date: 10/05/2021 CLINICAL DATA:  71 year old male with possible sepsis.  NSTEMI. EXAM: PORTABLE CHEST 1 VIEW COMPARISON:  Chest radiographs 07/16/2021. FINDINGS: Portable AP semi upright view at 0844 hours. Mildly rotated to the right. Low lung volumes stable since March. Mediastinal contours remain within normal limits. Moderate new veiling opacity in the right lung. No pneumothorax. Pulmonary vascularity seems stable, no definite edema. No definite left pleural effusion. No air bronchograms identified. Visualized tracheal air column is within normal limits. No acute osseous abnormality identified. Negative visible bowel gas pattern. IMPRESSION: Low lung volumes as seen in March with moderate new right lung veiling opacity favored to be new pleural effusion. No overt pulmonary edema. PA and lateral views of the chest would be  helpful when feasible. Electronically Signed   By: Genevie Ann M.D.   On: 10/05/2021 08:54   US Paracentesis  Result Date: 09/30/2021 INDICATION: NASH cirrhosis, recurrent ascites EXAM: ULTRASOUND GUIDED RLQ PARACENTESIS MEDICATIONS: None. COMPLICATIONS: None immediate. PROCEDURE: Informed written consent was obtained from the patient after a discussion of the risks, benefits and alternatives to treatment. A timeout was performed prior to the initiation of the procedure. Initial ultrasound scanning demonstrates a large amount of ascites within the right lower abdominal quadrant. The right lower abdomen was prepped and draped in the usual sterile fashion. 1% lidocaine was used for local anesthesia. Following this, a 19 gauge, 7-cm, Yueh catheter was introduced. An ultrasound image was saved for documentation purposes. The paracentesis was performed. The catheter was removed and a dressing was applied. The patient tolerated the procedure well without immediate post procedural complication. FINDINGS: A total of approximately 4.5L of yellow ascitic fluid was removed. Samples were sent to the laboratory as requested by the clinical team. IMPRESSION: Successful ultrasound-guided paracentesis yielding 4.5 liters of peritoneal fluid. PLAN: The patient has required >/=2 paracenteses in a 30 day period and a formal evaluation by the [facility] has been arranged. Electronically Signed   By: Marijo Conception M.D.   On: 09/30/2021 10:41   US Paracentesis  Result Date: 09/21/2021 INDICATION: Nonalcoholic cirrhosis EXAM: ULTRASOUND GUIDED DIAGNOSTIC AND THERAPEUTIC PARACENTESIS MEDICATIONS: None. COMPLICATIONS: None immediate. PROCEDURE: Informed written consent was obtained from the patient after a discussion of the risks, benefits and alternatives to treatment. A timeout was performed prior to the initiation of the procedure. Initial ultrasound scanning demonstrates a large  amount of ascites within the right lower abdominal quadrant. The right lower abdomen was  prepped and draped in the usual sterile fashion. 1% lidocaine was used for local anesthesia. Following this, a 5 [place] Yueh catheter was introduced. An ultrasound image was saved for documentation purposes. The paracentesis was performed. The catheter was removed and a dressing was applied. The patient tolerated the procedure well without immediate post procedural complication. Patient received post-procedure intravenous albumin; see nursing notes for details. FINDINGS: A total of approximately 3.7 L of amber colored ascitic fluid was removed. Samples were sent to the laboratory as requested by the clinical team. IMPRESSION: Successful ultrasound-guided paracentesis yielding 3.7 liters of peritoneal fluid. Electronically Signed   By: Lavonia Dana M.D.   On: 09/21/2021 11:20   US Paracentesis  Result Date: 09/09/2021 INDICATION: Patient with a history of cirrhosis and possible HCC with recurrent ascites. Interventional Radiology asked to perform a diagnostic and therapeutic paracentesis. EXAM: ULTRASOUND GUIDED PARACENTESIS MEDICATIONS: 1% lidocaine 10 ml COMPLICATIONS: None immediate. PROCEDURE: Informed written consent was obtained from the patient after a discussion of the risks, benefits and alternatives to treatment. A timeout was performed prior to the initiation of the procedure. Initial ultrasound scanning demonstrates a large amount of ascites within the right lower abdominal quadrant. The right lower abdomen was prepped and draped in the usual sterile fashion. 1% lidocaine was used for local anesthesia. Following this, a 19 gauge, 7-cm, Yueh catheter was introduced. An ultrasound image was saved for documentation purposes. The paracentesis was performed. The catheter was removed and a dressing was applied. The patient tolerated the procedure well without immediate post procedural complication. FINDINGS: A total of  approximately 1.6 L of dark yellow/amber-colored fluid was removed. Samples were sent to the laboratory as requested by the clinical team. IMPRESSION: Successful ultrasound-guided paracentesis yielding 1.6 liters of peritoneal fluid. Read by: Soyla Dryer, NP PLAN: The patient has required >/=2 paracenteses in a 30 day period and a formal evaluation by the [place] [facility] has been arranged. Electronically Signed   By: Lavonia Dana M.D.   On: 09/09/2021 11:24    Labs:  CBC: Recent Labs    10/07/21 0158 10/07/21 1117 10/07/21 1809 10/08/21 0657 10/08/21 1020  WBC 5.9 6.5 16.9* 10.6*  --   HGB 6.8* 7.1* 9.4* 8.4*  --   HCT 20.8* 20.7* 27.6* 25.1*  --   PLT 36* 40*  43* 68* 43* 46*    COAGS: Recent Labs    10/05/21 0822 10/06/21 0133 10/07/21 0158 10/07/21 1117 10/08/21 1020  INR 4.7* 3.4* 5.1* 3.4* 3.8*  APTT 65*  --   --  53* 53*    BMP: Recent Labs    10/05/21 0822 10/06/21 0133 10/07/21 0158 10/08/21 0106  NA 129* 133* 132* 136  K 4.4 4.2 4.1 4.0  CL 103 107 103 104  CO2 15* 16* 16* 18*  GLUCOSE 148* 91 104* 96  BUN 81* 74* 82* 90*  CALCIUM 8.6* 8.5* 7.9* 7.5*  CREATININE 2.66* 2.86* 3.05* 2.97*  GFRNONAA 25* 23* 21* 22*    LIVER FUNCTION TESTS: Recent Labs    10/05/21 0822 10/06/21 0133 10/07/21 0158 10/08/21 0106  BILITOT 4.7* 7.8* 8.6* 10.3*  AST 150* 1,454* 1,336* 841*  ALT 82* 667* 706* 528*  ALKPHOS 65 67 60 62  PROT <3.0* 4.1* 4.1* 3.9*  ALBUMIN <1.5* 2.0* 2.5* 2.4*    TUMOR MARKERS: Recent Labs    03/25/21 0934 07/06/21 1156 08/20/21 1018  AFPTM 8.8* 14.6* 10.3*    Assessment and Plan: 71 y.o. male with  with history of NASH cirrhosis (Child Pugh C, MELD 26) with recurrent ascites, unifocal hepatocellular carcinoma in segment VIII (1.2 cm, LR4), currently without treatment options due to liver dysfunction, and history of recent anemia and melena related to hemorrhage from portal  hypertensive gastropathy.    SBP in low 100s or high 90s w/o vasopressors since yesterday RF BUN 90, creatinine 2.97, GFR 22 - no intervention needed per Dr. Suttle  INR 5.1 yesterday, LFT much improved today. Dr. Suttle discussed with critical care, pt will receive vit K today and FFP tomorrow. Repeat INR for tomorrow AM ordered.  Blood cx obtained yesterday, NG 1 day - will proceed regardless of blood cx result  Risks and benefits of transjugular, transhepatic portal venogram with possible portal vein angioplasty, possible portal vein stent placement were discussed with the patient and/or the patient's family including, but not limited to, infection, bleeding, damage to adjacent structures, worsening hepatic and/or cardiac function, worsening and/or the development of altered mental status/encephalopathy, and death.   This interventional procedure involves the use of X-rays and because of the nature of the planned procedure, it is possible that we will have prolonged use of X-ray fluoroscopy.  Potential radiation risks to you include (but are not limited to) the following: - A slightly elevated risk for cancer  several years later in life. This risk is typically less than 0.5% percent. This risk is low in comparison to the normal incidence of human cancer, which is 33% for women and 50% for men according to the [employer]. - Radiation induced injury can include skin redness, resembling a rash, tissue breakdown / ulcers and hair loss (which can be temporary or permanent).   The likelihood of either of these occurring depends on the difficulty of the procedure and whether you are sensitive to radiation due to previous procedures, disease, or genetic conditions.   IF your procedure requires a prolonged use of radiation, you will be notified and given written instructions for further action.  It is your responsibility to monitor the irradiated area for the 2 weeks following the  procedure and to notify your physician if you are concerned that you have suffered a radiation induced injury.    All of the patient and his family members' questions were answered, and there is an agreement to proceed.  Consent signed and in chart.   Patient is scheduled for transjugular, transhepatic portal venogram with possible portal vein angioplasty, possible portal vein stent placement, in addition to splenorenal shunt coil embolization with anesthesia team tomorrow at 9 am.   PLAN  - Keep pt NPO - Pt on Ancef 2 g q 12 hours, scheduled to be given at 6 am tomorrow - 6 am dose held in MAR, 2g ceftriaxone will be given in IR.  - IR APP will re-evaluate the patient tomorrow AM   Thank you for this interesting consult.  I greatly enjoyed meeting Burrel L Naeve and look forward to participating in their care.  A copy of this report was sent to the requesting provider on this date.  Electronically Signed: Aimee H Han, PA-C 10/08/2021, 11:37 AM   I spent a total of 30 minutes   in face to face in clinical consultation, greater than 50% of which was counseling/coordinating care for  transjugular, transhepatic portal venogram with possible portal vein angioplasty, possible portal vein stent placement, in addition to splenorenal shunt coil embolization.  This chart was dictated using voice recognition software.  Despite best efforts to   proofread,  errors can occur which can change the documentation meaning.

## 2021-10-09 ENCOUNTER — Inpatient Hospital Stay (HOSPITAL_COMMUNITY): Payer: Medicare Other

## 2021-10-09 ENCOUNTER — Inpatient Hospital Stay (HOSPITAL_COMMUNITY): Payer: Medicare Other | Admitting: Certified Registered"

## 2021-10-09 ENCOUNTER — Other Ambulatory Visit (HOSPITAL_COMMUNITY): Payer: Self-pay

## 2021-10-09 ENCOUNTER — Inpatient Hospital Stay (HOSPITAL_COMMUNITY)
Admission: RE | Admit: 2021-10-09 | Payer: Medicare Other | Source: Home / Self Care | Admitting: Interventional Radiology

## 2021-10-09 ENCOUNTER — Encounter (HOSPITAL_COMMUNITY): Payer: Self-pay | Admitting: Internal Medicine

## 2021-10-09 ENCOUNTER — Inpatient Hospital Stay (HOSPITAL_COMMUNITY)
Admission: RE | Admit: 2021-10-09 | Discharge: 2021-10-09 | Disposition: A | Discharge status: Home / Self Care | Payer: Medicare Other | Source: Ambulatory Visit | Attending: Interventional Radiology | Admitting: Interventional Radiology

## 2021-10-09 ENCOUNTER — Encounter (HOSPITAL_COMMUNITY)
Admission: EM | Disposition: A | Discharge status: Hospice | Payer: Self-pay | Source: Home / Self Care | Attending: Pulmonary Disease

## 2021-10-09 DIAGNOSIS — M1990 Unspecified osteoarthritis, unspecified site: Secondary | ICD-10-CM

## 2021-10-09 DIAGNOSIS — K7460 Unspecified cirrhosis of liver: Secondary | ICD-10-CM

## 2021-10-09 DIAGNOSIS — N179 Acute kidney failure, unspecified: Secondary | ICD-10-CM | POA: Diagnosis not present

## 2021-10-09 DIAGNOSIS — I2510 Atherosclerotic heart disease of native coronary artery without angina pectoris: Secondary | ICD-10-CM

## 2021-10-09 DIAGNOSIS — K922 Gastrointestinal hemorrhage, unspecified: Secondary | ICD-10-CM | POA: Diagnosis not present

## 2021-10-09 DIAGNOSIS — I252 Old myocardial infarction: Secondary | ICD-10-CM

## 2021-10-09 DIAGNOSIS — K7682 Hepatic encephalopathy: Secondary | ICD-10-CM | POA: Diagnosis not present

## 2021-10-09 HISTORY — PX: IR PARACENTESIS: IMG2679

## 2021-10-09 HISTORY — PX: IR INTRAVASCULAR ULTRASOUND NON CORONARY: IMG6085

## 2021-10-09 HISTORY — PX: IR ANGIOGRAM SELECTIVE EACH ADDITIONAL VESSEL: IMG667

## 2021-10-09 HISTORY — PX: RADIOLOGY WITH ANESTHESIA: SHX6223

## 2021-10-09 HISTORY — PX: IR US GUIDE VASC ACCESS RIGHT: IMG2390

## 2021-10-09 HISTORY — PX: IR TIPS: IMG2295

## 2021-10-09 HISTORY — PX: IR EMBO VENOUS NOT HEMORR HEMANG  INC GUIDE ROADMAPPING: IMG5447

## 2021-10-09 LAB — POCT I-STAT 7, (LYTES, BLD GAS, ICA,H+H)
Acid-base deficit: 4 mmol/L — ABNORMAL HIGH (ref 0.0–2.0)
Acid-base deficit: 4 mmol/L — ABNORMAL HIGH (ref 0.0–2.0)
Acid-base deficit: 5 mmol/L — ABNORMAL HIGH (ref 0.0–2.0)
Acid-base deficit: 4 mmol/L — ABNORMAL HIGH (ref 0.0–2.0)
Bicarbonate: 21.5 mmol/L (ref 20.0–28.0)
Bicarbonate: 20.9 mmol/L (ref 20.0–28.0)
Bicarbonate: 21.5 mmol/L (ref 20.0–28.0)
Bicarbonate: 21.8 mmol/L (ref 20.0–28.0)
Calcium, Ion: 1.05 mmol/L — ABNORMAL LOW (ref 1.15–1.40)
Calcium, Ion: 1.07 mmol/L — ABNORMAL LOW (ref 1.15–1.40)
Calcium, Ion: 0.98 mmol/L — ABNORMAL LOW (ref 1.15–1.40)
Calcium, Ion: 1.24 mmol/L (ref 1.15–1.40)
HCT: 18 % — ABNORMAL LOW (ref 39.0–52.0)
HCT: 22 % — ABNORMAL LOW (ref 39.0–52.0)
HCT: 24 % — ABNORMAL LOW (ref 39.0–52.0)
HCT: 21 % — ABNORMAL LOW (ref 39.0–52.0)
Hemoglobin: 6.1 g/dL — CL (ref 13.0–17.0)
Hemoglobin: 7.5 g/dL — ABNORMAL LOW (ref 13.0–17.0)
Hemoglobin: 8.2 g/dL — ABNORMAL LOW (ref 13.0–17.0)
Hemoglobin: 7.1 g/dL — ABNORMAL LOW (ref 13.0–17.0)
O2 Saturation: 98 %
O2 Saturation: 97 %
O2 Saturation: 98 %
O2 Saturation: 99 %
Patient temperature: 34.7
Patient temperature: 34.2
Potassium: 3.4 mmol/L — ABNORMAL LOW (ref 3.5–5.1)
Potassium: 3.6 mmol/L (ref 3.5–5.1)
Potassium: 3.8 mmol/L (ref 3.5–5.1)
Potassium: 3.7 mmol/L (ref 3.5–5.1)
Sodium: 141 mmol/L (ref 135–145)
Sodium: 141 mmol/L (ref 135–145)
Sodium: 140 mmol/L (ref 135–145)
Sodium: 141 mmol/L (ref 135–145)
TCO2: 23 mmol/L (ref 22–32)
TCO2: 22 mmol/L (ref 22–32)
TCO2: 23 mmol/L (ref 22–32)
TCO2: 23 mmol/L (ref 22–32)
pCO2 arterial: 35 mmHg (ref 32–48)
pCO2 arterial: 38 mmHg (ref 32–48)
pCO2 arterial: 37.9 mmHg (ref 32–48)
pCO2 arterial: 40.7 mmHg (ref 32–48)
pH, Arterial: 7.386 (ref 7.35–7.45)
pH, Arterial: 7.349 — ABNORMAL LOW (ref 7.35–7.45)
pH, Arterial: 7.347 — ABNORMAL LOW (ref 7.35–7.45)
pH, Arterial: 7.337 — ABNORMAL LOW (ref 7.35–7.45)
pO2, Arterial: 105 mmHg (ref 83–108)
pO2, Arterial: 100 mmHg (ref 83–108)
pO2, Arterial: 107 mmHg (ref 83–108)
pO2, Arterial: 145 mmHg — ABNORMAL HIGH (ref 83–108)

## 2021-10-09 LAB — BPAM PLATELET PHERESIS
Blood Product Expiration Date: 202306222359
Blood Product Expiration Date: 202306232359
ISSUE DATE / TIME: 202306221119
ISSUE DATE / TIME: 202306221203
Unit Type and Rh: 6200
Unit Type and Rh: 7300

## 2021-10-09 LAB — DIC (DISSEMINATED INTRAVASCULAR COAGULATION)PANEL
D-Dimer, Quant: 7.63 ug/mL-FEU — ABNORMAL HIGH (ref 0.00–0.50)
Fibrinogen: 155 mg/dL — ABNORMAL LOW (ref 210–475)
INR: 3 — ABNORMAL HIGH (ref 0.8–1.2)
Platelets: 47 10*3/uL — ABNORMAL LOW (ref 150–400)
Prothrombin Time: 31.1 seconds — ABNORMAL HIGH (ref 11.4–15.2)
Smear Review: NONE SEEN
aPTT: 52 seconds — ABNORMAL HIGH (ref 24–36)

## 2021-10-09 LAB — PREPARE FRESH FROZEN PLASMA: Unit division: 0

## 2021-10-09 LAB — BASIC METABOLIC PANEL
Anion gap: 13 (ref 5–15)
BUN: 94 mg/dL — ABNORMAL HIGH (ref 8–23)
CO2: 20 mmol/L — ABNORMAL LOW (ref 22–32)
Calcium: 7.5 mg/dL — ABNORMAL LOW (ref 8.9–10.3)
Chloride: 109 mmol/L (ref 98–111)
Creatinine, Ser: 2.76 mg/dL — ABNORMAL HIGH (ref 0.61–1.24)
GFR, Estimated: 24 mL/min — ABNORMAL LOW (ref >60)
Glucose, Bld: 86 mg/dL (ref 70–99)
Potassium: 3.2 mmol/L — ABNORMAL LOW (ref 3.5–5.1)
Sodium: 142 mmol/L (ref 135–145)

## 2021-10-09 LAB — COMPREHENSIVE METABOLIC PANEL
ALT: 273 U/L — ABNORMAL HIGH (ref 0–44)
AST: 399 U/L — ABNORMAL HIGH (ref 15–41)
Albumin: 1.9 g/dL — ABNORMAL LOW (ref 3.5–5.0)
Alkaline Phosphatase: 64 U/L (ref 38–126)
Anion gap: 8 (ref 5–15)
BUN: 98 mg/dL — ABNORMAL HIGH (ref 8–23)
CO2: 21 mmol/L — ABNORMAL LOW (ref 22–32)
Calcium: 7.1 mg/dL — ABNORMAL LOW (ref 8.9–10.3)
Chloride: 110 mmol/L (ref 98–111)
Creatinine, Ser: 2.74 mg/dL — ABNORMAL HIGH (ref 0.61–1.24)
GFR, Estimated: 24 mL/min — ABNORMAL LOW (ref >60)
Glucose, Bld: 96 mg/dL (ref 70–99)
Potassium: 3.3 mmol/L — ABNORMAL LOW (ref 3.5–5.1)
Sodium: 139 mmol/L (ref 135–145)
Total Bilirubin: 9.9 mg/dL — ABNORMAL HIGH (ref 0.3–1.2)
Total Protein: 3.5 g/dL — ABNORMAL LOW (ref 6.5–8.1)

## 2021-10-09 LAB — CBC
HCT: 24.1 % — ABNORMAL LOW (ref 39.0–52.0)
HCT: 22.9 % — ABNORMAL LOW (ref 39.0–52.0)
Hemoglobin: 8 g/dL — ABNORMAL LOW (ref 13.0–17.0)
Hemoglobin: 8.1 g/dL — ABNORMAL LOW (ref 13.0–17.0)
MCH: 31.6 pg (ref 26.0–34.0)
MCH: 32.5 pg (ref 26.0–34.0)
MCHC: 33.2 g/dL (ref 30.0–36.0)
MCHC: 35.4 g/dL (ref 30.0–36.0)
MCV: 95.3 fL (ref 80.0–100.0)
MCV: 92 fL (ref 80.0–100.0)
Platelets: 41 10*3/uL — ABNORMAL LOW (ref 150–400)
Platelets: 32 10*3/uL — ABNORMAL LOW (ref 150–400)
RBC: 2.53 MIL/uL — ABNORMAL LOW (ref 4.22–5.81)
RBC: 2.49 MIL/uL — ABNORMAL LOW (ref 4.22–5.81)
RDW: 21.4 % — ABNORMAL HIGH (ref 11.5–15.5)
RDW: 17.9 % — ABNORMAL HIGH (ref 11.5–15.5)
WBC: 9.8 10*3/uL (ref 4.0–10.5)
WBC: 5.5 10*3/uL (ref 4.0–10.5)
nRBC: 0 % (ref 0.0–0.2)
nRBC: 0 % (ref 0.0–0.2)

## 2021-10-09 LAB — POCT I-STAT, CHEM 8
BUN: 99 mg/dL — ABNORMAL HIGH (ref 8–23)
Calcium, Ion: 0.96 mmol/L — ABNORMAL LOW (ref 1.15–1.40)
Chloride: 104 mmol/L (ref 98–111)
Creatinine, Ser: 2.6 mg/dL — ABNORMAL HIGH (ref 0.61–1.24)
Glucose, Bld: 130 mg/dL — ABNORMAL HIGH (ref 70–99)
HCT: 24 % — ABNORMAL LOW (ref 39.0–52.0)
Hemoglobin: 8.2 g/dL — ABNORMAL LOW (ref 13.0–17.0)
Potassium: 3.7 mmol/L (ref 3.5–5.1)
Sodium: 140 mmol/L (ref 135–145)
TCO2: 21 mmol/L — ABNORMAL LOW (ref 22–32)

## 2021-10-09 LAB — BPAM FFP
Blood Product Expiration Date: 202306262359
Blood Product Expiration Date: 202306262359
ISSUE DATE / TIME: 202306222303
ISSUE DATE / TIME: 202306222303
Unit Type and Rh: 6200
Unit Type and Rh: 6200

## 2021-10-09 LAB — PREPARE PLATELET PHERESIS
Unit division: 0
Unit division: 0

## 2021-10-09 LAB — GLUCOSE, CAPILLARY
Glucose-Capillary: 119 mg/dL — ABNORMAL HIGH (ref 70–99)
Glucose-Capillary: 125 mg/dL — ABNORMAL HIGH (ref 70–99)
Glucose-Capillary: 131 mg/dL — ABNORMAL HIGH (ref 70–99)
Glucose-Capillary: 77 mg/dL (ref 70–99)
Glucose-Capillary: 83 mg/dL (ref 70–99)

## 2021-10-09 LAB — PROTIME-INR
INR: 3 — ABNORMAL HIGH (ref 0.8–1.2)
INR: 1.8 — ABNORMAL HIGH (ref 0.8–1.2)
Prothrombin Time: 31.1 seconds — ABNORMAL HIGH (ref 11.4–15.2)
Prothrombin Time: 20.7 seconds — ABNORMAL HIGH (ref 11.4–15.2)

## 2021-10-09 LAB — MAGNESIUM: Magnesium: 1.8 mg/dL (ref 1.7–2.4)

## 2021-10-09 LAB — PREPARE RBC (CROSSMATCH)

## 2021-10-09 SURGERY — IR WITH ANESTHESIA
Anesthesia: General

## 2021-10-09 MED ORDER — MIDAZOLAM HCL 2 MG/2ML IJ SOLN
INTRAMUSCULAR | Status: AC
  Filled 2021-10-09: qty 2

## 2021-10-09 MED ORDER — LACTATED RINGERS IV SOLN: INTRAVENOUS | Status: DC | PRN

## 2021-10-09 MED ORDER — IOHEXOL 300 MG/ML  SOLN
100.0000 mL | Freq: Once | INTRAMUSCULAR | Status: AC | PRN
  Administered 2021-10-09: 50 mL via INTRAVENOUS

## 2021-10-09 MED ORDER — CALCIUM CHLORIDE 10 % IV SOLN
INTRAVENOUS | Status: DC | PRN
  Administered 2021-10-09 (×3): 250 mg via INTRAVENOUS
  Administered 2021-10-09: 100 mg via INTRAVENOUS
  Administered 2021-10-09 (×3): 250 mg via INTRAVENOUS
  Administered 2021-10-09: 150 mg via INTRAVENOUS
  Administered 2021-10-09: 250 mg via INTRAVENOUS
  Administered 2021-10-09: 500 mg via INTRAVENOUS
  Administered 2021-10-09 (×2): 250 mg via INTRAVENOUS

## 2021-10-09 MED ORDER — PHENYLEPHRINE HCL-NACL 20-0.9 MG/250ML-% IV SOLN
25.0000 ug/min | INTRAVENOUS | Status: DC
  Administered 2021-10-09 – 2021-10-10 (×3): 25 ug/min via INTRAVENOUS
  Filled 2021-10-09 (×3): qty 250

## 2021-10-09 MED ORDER — SODIUM CHLORIDE 0.9% IV SOLUTION: Freq: Once | INTRAVENOUS | Status: DC

## 2021-10-09 MED ORDER — SODIUM CHLORIDE 0.9 % IV SOLN
250.0000 mL | INTRAVENOUS | Status: DC
  Administered 2021-10-09 – 2021-10-10 (×2): 250 mL via INTRAVENOUS

## 2021-10-09 MED ORDER — VASOPRESSIN 20 UNIT/ML IV SOLN
INTRAVENOUS | Status: DC | PRN
  Administered 2021-10-09 (×4): 1 [IU] via INTRAVENOUS

## 2021-10-09 MED ORDER — POTASSIUM CHLORIDE 10 MEQ/100ML IV SOLN
10.0000 meq | INTRAVENOUS | Status: AC
  Administered 2021-10-09: 10 meq via INTRAVENOUS
  Filled 2021-10-09: qty 100

## 2021-10-09 MED ORDER — ROCURONIUM BROMIDE 10 MG/ML (PF) SYRINGE
PREFILLED_SYRINGE | INTRAVENOUS | Status: DC | PRN
  Administered 2021-10-09: 40 mg via INTRAVENOUS
  Administered 2021-10-09 (×2): 20 mg via INTRAVENOUS

## 2021-10-09 MED ORDER — NOREPINEPHRINE 4 MG/250ML-% IV SOLN
INTRAVENOUS | Status: DC | PRN
  Administered 2021-10-09: 4 ug/min via INTRAVENOUS

## 2021-10-09 MED ORDER — ALBUMIN HUMAN 5 % IV SOLN: INTRAVENOUS | Status: DC | PRN

## 2021-10-09 MED ORDER — SODIUM CHLORIDE 0.9 % IV SOLN: INTRAVENOUS | Status: DC | PRN

## 2021-10-09 MED ORDER — FENTANYL CITRATE (PF) 100 MCG/2ML IJ SOLN
INTRAMUSCULAR | Status: DC | PRN
  Administered 2021-10-09: 50 ug via INTRAVENOUS

## 2021-10-09 MED ORDER — DEXAMETHASONE SODIUM PHOSPHATE 10 MG/ML IJ SOLN
INTRAMUSCULAR | Status: DC | PRN
  Administered 2021-10-09: 10 mg via INTRAVENOUS

## 2021-10-09 MED ORDER — DEXMEDETOMIDINE HCL IN NACL 400 MCG/100ML IV SOLN
0.4000 ug/kg/h | INTRAVENOUS | Status: DC
  Administered 2021-10-09: .5 ug/kg/h via INTRAVENOUS
  Filled 2021-10-09: qty 100

## 2021-10-09 MED ORDER — MAGNESIUM SULFATE 2 GM/50ML IV SOLN
2.0000 g | Freq: Once | INTRAVENOUS | Status: AC
  Administered 2021-10-09: 2 g via INTRAVENOUS
  Filled 2021-10-09: qty 50

## 2021-10-09 MED ORDER — PHENYLEPHRINE 80 MCG/ML (10ML) SYRINGE FOR IV PUSH (FOR BLOOD PRESSURE SUPPORT)
PREFILLED_SYRINGE | INTRAVENOUS | Status: DC | PRN
  Administered 2021-10-09 (×2): 80 ug via INTRAVENOUS

## 2021-10-09 MED ORDER — CEFAZOLIN SODIUM-DEXTROSE 2-4 GM/100ML-% IV SOLN
2.0000 g | Freq: Three times a day (TID) | INTRAVENOUS | Status: AC
  Administered 2021-10-10 – 2021-10-14 (×15): 2 g via INTRAVENOUS
  Filled 2021-10-09 (×15): qty 100

## 2021-10-09 MED ORDER — IOHEXOL 300 MG/ML  SOLN
100.0000 mL | Freq: Once | INTRAMUSCULAR | Status: AC | PRN
  Administered 2021-10-09: 50 mL via INTRA_ARTERIAL

## 2021-10-09 MED ORDER — PROPOFOL 10 MG/ML IV BOLUS
INTRAVENOUS | Status: DC | PRN
  Administered 2021-10-09 (×2): 20 mg via INTRAVENOUS
  Administered 2021-10-09: 40 mg via INTRAVENOUS

## 2021-10-09 MED ORDER — SUCCINYLCHOLINE CHLORIDE 200 MG/10ML IV SOSY
PREFILLED_SYRINGE | INTRAVENOUS | Status: DC | PRN
  Administered 2021-10-09: 120 mg via INTRAVENOUS

## 2021-10-09 MED ORDER — POTASSIUM CHLORIDE 10 MEQ/100ML IV SOLN
10.0000 meq | INTRAVENOUS | Status: AC
  Administered 2021-10-09 (×3): 10 meq via INTRAVENOUS
  Filled 2021-10-09 (×3): qty 100

## 2021-10-09 MED ORDER — MIDAZOLAM HCL 2 MG/2ML IJ SOLN
INTRAMUSCULAR | Status: DC | PRN
  Administered 2021-10-09: 1 mg via INTRAVENOUS

## 2021-10-09 NOTE — Procedures (Signed)
Extubation Procedure Note  Patient Details:   Name: Cody Austin DOB: 1950-08-02 MRN: [id number]   Airway Documentation:    Vent end date: 10/09/21 Vent end time: 1604   Evaluation  O2 sats: stable throughout Complications: No apparent complications Patient did tolerate procedure well. Bilateral Breath Sounds: Clear   Pt extubated to 8L simple mask per MD order. MD at bedside.  Guss Bunde 10/09/2021, 4:05 PM

## 2021-10-09 NOTE — Progress Notes (Signed)
PHARMACY NOTE:  ANTIMICROBIAL RENAL DOSAGE ADJUSTMENT  Current antimicrobial regimen includes a mismatch between antimicrobial dosage and estimated renal function.  As per policy approved by the Pharmacy & Therapeutics and Medical Executive Committees, the antimicrobial dosage will be adjusted accordingly.  Current antimicrobial dosage:  cefazolin 2g IV q12h  Indication: kleb pneumoniae bacteremia  Renal Function:  Estimated Creatinine Clearance: 31 mL/min (A) (by C-G formula based on SCr of 2.6 mg/dL (H)).    Antimicrobial dosage has been changed to:  cefazolin 2g IV q8h   Leia Alf, PharmD, BCPS Please check AMION for all [facility] contact numbers Clinical Pharmacist 10/09/2021 8:33 PM

## 2021-10-09 NOTE — Sedation Documentation (Addendum)
Pt transported to PACU bay 7 via bed accompanied by 2 CRNAs and RN. PACU RN at bedside to receive pt. Handoff completed. Right groin and right neck dressing clean dry and intact. VSS. No s/s of distress.

## 2021-10-10 ENCOUNTER — Encounter (HOSPITAL_COMMUNITY): Payer: Self-pay | Admitting: Internal Medicine

## 2021-10-10 DIAGNOSIS — K922 Gastrointestinal hemorrhage, unspecified: Secondary | ICD-10-CM | POA: Diagnosis not present

## 2021-10-10 DIAGNOSIS — K7581 Nonalcoholic steatohepatitis (NASH): Secondary | ICD-10-CM | POA: Diagnosis not present

## 2021-10-10 DIAGNOSIS — C220 Liver cell carcinoma: Secondary | ICD-10-CM

## 2021-10-10 DIAGNOSIS — K7460 Unspecified cirrhosis of liver: Secondary | ICD-10-CM | POA: Diagnosis not present

## 2021-10-10 LAB — TYPE AND SCREEN
ABO/RH(D): A POS
Antibody Screen: NEGATIVE
Unit division: 0
Unit division: 0
Unit division: 0
Unit division: 0
Unit division: 0
Unit division: 0
Unit division: 0
Unit division: 0
Unit division: 0
Unit division: 0

## 2021-10-10 LAB — BPAM RBC
Blood Product Expiration Date: 202307082359
Blood Product Expiration Date: 202307092359
Blood Product Expiration Date: 202307092359
Blood Product Expiration Date: 202307102359
Blood Product Expiration Date: 202307102359
Blood Product Expiration Date: 202307102359
Blood Product Expiration Date: 202307102359
Blood Product Expiration Date: 202307162359
Blood Product Expiration Date: 202307182359
Blood Product Expiration Date: 202307182359
ISSUE DATE / TIME: 202306201453
ISSUE DATE / TIME: 202306202105
ISSUE DATE / TIME: 202306210335
ISSUE DATE / TIME: 202306211447
ISSUE DATE / TIME: 202306230905
ISSUE DATE / TIME: 202306230905
ISSUE DATE / TIME: 202306231056
ISSUE DATE / TIME: 202306231056
Unit Type and Rh: 6200
Unit Type and Rh: 6200
Unit Type and Rh: 6200
Unit Type and Rh: 6200
Unit Type and Rh: 6200
Unit Type and Rh: 6200
Unit Type and Rh: 6200
Unit Type and Rh: 6200
Unit Type and Rh: 6200
Unit Type and Rh: 6200

## 2021-10-10 LAB — PREPARE FRESH FROZEN PLASMA
Unit division: 0
Unit division: 0
Unit division: 0
Unit division: 0
Unit division: 0
Unit division: 0

## 2021-10-10 LAB — COMPREHENSIVE METABOLIC PANEL
ALT: 110 U/L — ABNORMAL HIGH (ref 0–44)
AST: 158 U/L — ABNORMAL HIGH (ref 15–41)
Albumin: 2.3 g/dL — ABNORMAL LOW (ref 3.5–5.0)
Alkaline Phosphatase: 54 U/L (ref 38–126)
Anion gap: 10 (ref 5–15)
BUN: 95 mg/dL — ABNORMAL HIGH (ref 8–23)
CO2: 22 mmol/L (ref 22–32)
Calcium: 8.5 mg/dL — ABNORMAL LOW (ref 8.9–10.3)
Chloride: 110 mmol/L (ref 98–111)
Creatinine, Ser: 2.3 mg/dL — ABNORMAL HIGH (ref 0.61–1.24)
GFR, Estimated: 30 mL/min — ABNORMAL LOW (ref >60)
Glucose, Bld: 129 mg/dL — ABNORMAL HIGH (ref 70–99)
Potassium: 3.6 mmol/L (ref 3.5–5.1)
Sodium: 142 mmol/L (ref 135–145)
Total Bilirubin: 10.1 mg/dL — ABNORMAL HIGH (ref 0.3–1.2)
Total Protein: 3.9 g/dL — ABNORMAL LOW (ref 6.5–8.1)

## 2021-10-10 LAB — GLUCOSE, CAPILLARY
Glucose-Capillary: 119 mg/dL — ABNORMAL HIGH (ref 70–99)
Glucose-Capillary: 122 mg/dL — ABNORMAL HIGH (ref 70–99)
Glucose-Capillary: 124 mg/dL — ABNORMAL HIGH (ref 70–99)
Glucose-Capillary: 125 mg/dL — ABNORMAL HIGH (ref 70–99)
Glucose-Capillary: 127 mg/dL — ABNORMAL HIGH (ref 70–99)
Glucose-Capillary: 130 mg/dL — ABNORMAL HIGH (ref 70–99)

## 2021-10-10 LAB — BPAM FFP
Blood Product Expiration Date: 202306262359
Blood Product Expiration Date: 202306262359
Blood Product Expiration Date: 202306282359
Blood Product Expiration Date: 202306282359
Blood Product Expiration Date: 202307102359
Blood Product Expiration Date: 202307102359
ISSUE DATE / TIME: 202306230906
ISSUE DATE / TIME: 202306230906
ISSUE DATE / TIME: 202306230942
ISSUE DATE / TIME: 202306230942
Unit Type and Rh: 6200
Unit Type and Rh: 8400
Unit Type and Rh: 600
Unit Type and Rh: 6200
Unit Type and Rh: 6200
Unit Type and Rh: 6200

## 2021-10-10 LAB — PREPARE CRYOPRECIPITATE: Unit division: 0

## 2021-10-10 LAB — MAGNESIUM: Magnesium: 2.1 mg/dL (ref 1.7–2.4)

## 2021-10-10 LAB — CBC
HCT: 22.1 % — ABNORMAL LOW (ref 39.0–52.0)
HCT: 21.3 % — ABNORMAL LOW (ref 39.0–52.0)
Hemoglobin: 7.6 g/dL — ABNORMAL LOW (ref 13.0–17.0)
Hemoglobin: 7.3 g/dL — ABNORMAL LOW (ref 13.0–17.0)
MCH: 31.5 pg (ref 26.0–34.0)
MCH: 31.6 pg (ref 26.0–34.0)
MCHC: 34.4 g/dL (ref 30.0–36.0)
MCHC: 34.3 g/dL (ref 30.0–36.0)
MCV: 91.7 fL (ref 80.0–100.0)
MCV: 92.2 fL (ref 80.0–100.0)
Platelets: 43 10*3/uL — ABNORMAL LOW (ref 150–400)
Platelets: 55 10*3/uL — ABNORMAL LOW (ref 150–400)
RBC: 2.41 MIL/uL — ABNORMAL LOW (ref 4.22–5.81)
RBC: 2.31 MIL/uL — ABNORMAL LOW (ref 4.22–5.81)
RDW: 18.5 % — ABNORMAL HIGH (ref 11.5–15.5)
RDW: 18.7 % — ABNORMAL HIGH (ref 11.5–15.5)
WBC: 10 10*3/uL (ref 4.0–10.5)
WBC: 10.1 10*3/uL (ref 4.0–10.5)
nRBC: 0.2 % (ref 0.0–0.2)
nRBC: 0 % (ref 0.0–0.2)

## 2021-10-10 LAB — DIC (DISSEMINATED INTRAVASCULAR COAGULATION)PANEL
D-Dimer, Quant: >20 ug/mL-FEU — ABNORMAL HIGH (ref 0.00–0.50)
Fibrinogen: 152 mg/dL — ABNORMAL LOW (ref 210–475)
INR: 2.8 — ABNORMAL HIGH (ref 0.8–1.2)
Platelets: 43 10*3/uL — ABNORMAL LOW (ref 150–400)
Prothrombin Time: 28.9 seconds — ABNORMAL HIGH (ref 11.4–15.2)
Smear Review: NONE SEEN
aPTT: 50 seconds — ABNORMAL HIGH (ref 24–36)

## 2021-10-10 LAB — PREPARE PLATELET PHERESIS: Unit division: 0

## 2021-10-10 LAB — BPAM PLATELET PHERESIS
Blood Product Expiration Date: 202306252359
ISSUE DATE / TIME: 202306230637
Unit Type and Rh: 5100

## 2021-10-10 LAB — ACID FAST CULTURE WITH REFLEXED SENSITIVITIES (MYCOBACTERIA): Acid Fast Culture: NEGATIVE

## 2021-10-10 LAB — BPAM CRYOPRECIPITATE
Blood Product Expiration Date: 202306231503
ISSUE DATE / TIME: 202306230929
Unit Type and Rh: 6200

## 2021-10-10 LAB — PHOSPHORUS: Phosphorus: 4.4 mg/dL (ref 2.5–4.6)

## 2021-10-10 MED ORDER — SODIUM CHLORIDE 0.9% IV SOLUTION: Freq: Once | INTRAVENOUS | Status: DC

## 2021-10-10 MED ORDER — POTASSIUM CHLORIDE 10 MEQ/50ML IV SOLN
10.0000 meq | INTRAVENOUS | Status: AC
  Administered 2021-10-10 (×4): 10 meq via INTRAVENOUS
  Filled 2021-10-10 (×4): qty 50

## 2021-10-10 MED ORDER — RIFAXIMIN 550 MG PO TABS
550.0000 mg | ORAL_TABLET | Freq: Two times a day (BID) | ORAL | Status: DC
  Administered 2021-10-11 – 2021-10-16 (×11): 550 mg via ORAL
  Filled 2021-10-10 (×11): qty 1

## 2021-10-10 MED ORDER — FUROSEMIDE 10 MG/ML IJ SOLN
40.0000 mg | Freq: Two times a day (BID) | INTRAMUSCULAR | Status: AC
  Administered 2021-10-10 (×2): 40 mg via INTRAVENOUS
  Filled 2021-10-10 (×2): qty 4

## 2021-10-10 NOTE — Progress Notes (Addendum)
Attending physician's note   I have reviewed the chart and discussed his care on rounds. I performed a substantive portion of this encounter, including complete performance of at least one of the key components, in conjunction with the APP. I agree with the APP's note, impression, and recommendations with my edits.   Liver enzymes improved and T. bili overall stable.  H/H 7.6/22.1 with PLT 43 after receiving 4 units RBCs, 4 FFP, cryo, and PLT intraoperatively for IR procedure yesterday.  No reported overt bleeding.  Renal function slightly improved as well, but overall prognosis remains quite guarded.  Ellissa Ayo, DO, FACG [phone] office          Progress Note  Primary GI: Dr Karilyn Cota, [facility]   Subjective  Chief Complaint: Acute blood loss anemia with hemorrhagic shock, decompensated NASH cirrhosis, refractory ascites  Patient lying in bed, lethargic but able to wake up and respond to questions.   Does know where he is currently unable to answer the year or date. Patient complains of dry mouth which can have something to drink, nurses only given him ice chips due to continuing sedation/lethargy. Denies any AB pain.     Objective   Vital signs in last 24 hours: Temp:  [95 F (35 C)-98.4 F (36.9 C)] 97.9 F (36.6 C) (06/24 1100) Pulse Rate:  [72-111] 101 (06/24 1100) Resp:  [9-24] 15 (06/24 1100) BP: (97-154)/(51-71) 115/63 (06/24 0800) SpO2:  [87 %-100 %] 99 % (06/24 1100) Arterial Line BP: (82-157)/(35-70) 112/53 (06/24 0800) FiO2 (%):  [40 %-70 %] 40 % (06/23 1548) Weight:  [107.3 kg] 107.3 kg (06/24 0500) Last BM Date : 10/09/21 Last BM recorded by nurses in past 5 days Stool Type: Type 7 (Liquid consistency with no solid pieces) (10/09/2021 12:00 AM)  General: Jaundiced, chronically ill-appearing obese male. Heart: Regular rate and rhythm harsh holosystolic murmur best heard left side radiates to abdomen. Pulm: Diffusely decreased breath sounds,  on oxygen. Abdomen:  Soft,  distended, shifting dullness to percussion,  Sluggish bowel sounds. No tenderness .  Extremities: Patient with 2-3+ edema bilateral legs, SCDs in place, bilateral arms, slight diffuse anasarca appearance. Neurologic:  Alert and  oriented x2;  No focal deficits. + Astrexis  Psych: Lethargic, will wake with verbal stimuli, able to follow commands, but falls asleep very quickly.  Intake/Output from previous day: 06/23 0701 - 06/24 0700 In: 6198.4 [I.V.:2295.4; Blood:2703; IV Piggyback:1200] Out: 4750 [Urine:1550; Blood:200] Intake/Output this shift: Total I/O In: -  Out: 425 [Urine:425]  Studies/Results: IR Tips  Result Date: 10/09/2021 CLINICAL DATA:  71 year old male with NASH cirrhosis (Child Pugh C) and severe portal hypertension exacerbated by main portal vein stenosis, likely from chronic non-occlusive thrombus, in addition to hepatofugal portal flow due to massive splenorenal shunt. Plan for TIPS approach to angioplasty portal vein with possible stent placement, followed by embolization of splenorenal shunt in hopes to establish antegrade portal flow to improve what liver function he may have left. EXAM: 1. Ultrasound-guided paracentesis 2. Ultrasound-guided access of the right internal jugular vein 3. Ultrasound-guided access of the right common femoral vein 4. Hepatic venogram 5. Intravascular ultrasound 6. Catheterization of the portal vein 7. Portal venous and central manometry 8. Portal venogram 9. Coil embolization of splenorenal shunt 10. Angioplasty of the main portal vein 11. Creation of a transhepatic portal vein to hepatic vein shunt MEDICATIONS: As antibiotic prophylaxis, Rocephin 1 gm IV was ordered pre-procedure and administered intravenously within one hour of  incision. ANESTHESIA/SEDATION: General - as administered by the Anesthesia department CONTRAST:  One hundred fifty ML Omnipaque 300, intravenous FLUOROSCOPY TIME:  Fluoroscopy Time: 54.3 minutes  (1,312 mGy). COMPLICATIONS: None immediate. PROCEDURE: Informed written consent was obtained from the patient after a thorough discussion of the procedural risks, benefits and alternatives. All questions were addressed. Maximal Sterile Barrier Technique was utilized including caps, mask, sterile gowns, sterile gloves, sterile drape, hand hygiene and skin antiseptic. A timeout was performed prior to the initiation of the procedure. The right lower quadrant was prepped and draped in standard fashion. Preprocedure ultrasound evaluation demonstrated large volume ascites. A small skin nick was made. Under direct ultrasound visualization, a 10 cm, 19 gauge Yueh needle was directed into the ascitic fluid in the needle was removed. Throughout the procedure, a total of 3 L of translucent, amber fluid were removed. A preliminary ultrasound of the right groin was performed and demonstrates a patent right common femoral vein. A permanent ultrasound image was recorded. Using a combination of fluoroscopy and ultrasound, an access site was determined. A small dermatotomy was made at the planned puncture site. Using ultrasound guidance, access into the right common femoral vein was obtained with visualization of needle entry into the vessel using a standard micropuncture technique. A wire was advanced into the IVC insert all fascial dilation performed. An 8 [place], 11 cm vascular sheath was placed into the external iliac vein. Through this access site, an 57 [place] ICE catheter was advanced with ease under fluoroscopic guidance to the level of the intrahepatic inferior vena cava. A preliminary ultrasound of the right neck was performed and demonstrates a patent internal jugular vein. A permanent ultrasound image was recorded. Using a combination of fluoroscopy and ultrasound, an access site was determined. A small dermatotomy was made at the planned puncture site. Using ultrasound guidance, access into the right internal  jugular vein was obtained with visualization of needle entry into the vessel using a standard micropuncture technique. A wire was advanced into the IVC and serial fascial dilation performed. A 10 French Gore dry seal sheath was placed into the internal jugular vein and advanced to the IVC. The jugular sheath was retracted into the right atrium and manometry was performed. Right atrial mean pressure was 16 mm Hg. A 5 French angled tip catheter was then directed into the right hepatic vein. Hepatic venogram was performed. These images demonstrated patent hepatic vein with no stenosis. The catheter was advanced to a wedge portion of the a patent vein over which the 10 French sheath was advanced into the right hepatic vein. Using ICE ultrasound visualization the catheter as right hepatic vein as well as the portal anatomy was defined. A planned exit site from the hepatic vein and puncture site from the portal vein was placed into a single sonographic plane. Under direct ultrasound visualization, the ScorpionX needle was advanced into the central right portal vein. Hand injection of contrast confirmed position within the portal system. A Glidewire Advantage was then advanced into the splenic vein. A 5 French marking pigtail catheter was then advanced over the wire into the main portal vein and wire removed. Portal venogram was performed which demonstrated complete hepatofugal flow in the portal vein, primarily through the massive splenorenal shunt and into the inferior vena cava. Portal manometry was performed which demonstrated a mean portal pressure of 21 mm Hg. Next, a V 18 wire was inserted through the pigtail catheter which was removed. Adjacent to the safety wire, an angled tip  Navicross catheter and a Glidewire Advantage were directed to the inferior vena cava and used to select the left renal vein. The catheter was exchanged for a C2 in order to select the splenorenal shunt. Venogram demonstrated brisk flow into  the left renal vein which was severely dilated. Coil embolization was then performed with an assortment of 0.035 inch Azur coils. Pigtail catheter was then reinserted into the main portal/central splenic vein. Venogram was again performed which demonstrated persistent patency of the splenorenal shunt and no evidence of increased antegrade flow. Portal manometry was again performed demonstrating a mean portal pressure increased to 30 mm Hg. Therefore, intravascular ultrasound was performed to interrogate the portal vein for possible clot or stenosis. There is a focal wall adherent echogenic chronic thrombus in the mid main portal vein with significantly smaller luminal diameter than just proximal distal to the stenosis. Therefore, balloon angioplasty was performed with a 14 mm by 4 cm atlas balloon. Repeat intravascular ultrasound demonstrated improved luminal diameter with disruption of the chronic thrombus. No evidence of dissection. Repeat venogram demonstrated persistent hepatofugal flow through the portal vein and into the splenorenal shunt. Therefore, from the splenic vein approach, the tortuous portion of the splenorenal shunt near the indwelling coil pack was accessed with a Navicross catheter. An additional assortment of 0.035 inch Azur coils were placed. Repeat venogram demonstrated persistent patency. Therefore, a Progreat microcatheter was inserted and 60 cm followed by a 45 cm Penumbra packing coils were deployed. At this point, retrograde flow to splenorenal shunt was slowed, however stool present. Several gastric veins within beginning to fill that were not previously visualized. Given these findings, in an attempt to improve antegrade flow, TIPS endograft deployment was pursued. Under direct fluoroscopic visualization, an 8-10 mm, 7+ 2 cm Viatorr endograft was deployed. Post deployment venogram demonstrated occlusion of the indwelling endograft, likely secondary to stenosis in the intraparenchymal  tract. Therefore, balloon angioplasty of the stenotic area was performed with a 6 mm x 2 cm mustang balloon. Repeat portal venogram demonstrated patency of the endograft with brisk antegrade flow 1 injected from the main portal vein. Additional splenic venogram was performed after TIPS balloon molding which demonstrated persistent retrograde flow through the splenorenal shunt in indwelling coil packs. Therefore, retrograde access to the splenorenal shunt was again pursued from the left renal vein. There was an attempt to place a 7 French sheath into the splenorenal shunt in hopes of deployment of a 14 [place] Amplatzer plug. However due to tortuosity, after multiple attempts to gain stable access, cannulation was not successful. The catheters and sheath were removed and manual compression was applied to the right internal jugular and right common femoral venous access sites until hemostasis was achieved. The patient was transferred to the PACU in stable condition. IMPRESSION: 1. Complete hepatopetal portal venous flow, the majority exiting via massive splenorenal shunt. 2. Coil embolization of the splenorenal shunt. There is still some retrograde flow through the indwelling coil packs in the splenorenal shunt upon completion, hopeful for these to continue to thrombose over time and reverse to hepatopetal flow. 3. Balloon angioplasty of the main portal vein. 4. Small TIPS placement with post deployment balloon molding to 6 mm. 5. Ultrasound-guided paracentesis yielding 3 L ascitic fluid. Marliss Coots, MD [facility] Radiology Electronically Signed   By: Marliss Coots M.D.   On: 10/09/2021 17:02   IR US Guide Vasc Access Right  Result Date: 10/09/2021 CLINICAL DATA:  71 year old male with NASH  cirrhosis (Child Pugh C) and severe portal hypertension exacerbated by main portal vein stenosis, likely from chronic non-occlusive thrombus, in addition to hepatofugal portal  flow due to massive splenorenal shunt. Plan for TIPS approach to angioplasty portal vein with possible stent placement, followed by embolization of splenorenal shunt in hopes to establish antegrade portal flow to improve what liver function he may have left. EXAM: 1. Ultrasound-guided paracentesis 2. Ultrasound-guided access of the right internal jugular vein 3. Ultrasound-guided access of the right common femoral vein 4. Hepatic venogram 5. Intravascular ultrasound 6. Catheterization of the portal vein 7. Portal venous and central manometry 8. Portal venogram 9. Coil embolization of splenorenal shunt 10. Angioplasty of the main portal vein 11. Creation of a transhepatic portal vein to hepatic vein shunt MEDICATIONS: As antibiotic prophylaxis, Rocephin 1 gm IV was ordered pre-procedure and administered intravenously within one hour of incision. ANESTHESIA/SEDATION: General - as administered by the Anesthesia department CONTRAST:  One hundred fifty ML Omnipaque 300, intravenous FLUOROSCOPY TIME:  Fluoroscopy Time: 54.3 minutes (1,312 mGy). COMPLICATIONS: None immediate. PROCEDURE: Informed written consent was obtained from the patient after a thorough discussion of the procedural risks, benefits and alternatives. All questions were addressed. Maximal Sterile Barrier Technique was utilized including caps, mask, sterile gowns, sterile gloves, sterile drape, hand hygiene and skin antiseptic. A timeout was performed prior to the initiation of the procedure. The right lower quadrant was prepped and draped in standard fashion. Preprocedure ultrasound evaluation demonstrated large volume ascites. A small skin nick was made. Under direct ultrasound visualization, a 10 cm, 19 gauge Yueh needle was directed into the ascitic fluid in the needle was removed. Throughout the procedure, a total of 3 L of translucent, amber fluid were removed. A preliminary ultrasound of the right groin was performed and demonstrates a patent right  common femoral vein. A permanent ultrasound image was recorded. Using a combination of fluoroscopy and ultrasound, an access site was determined. A small dermatotomy was made at the planned puncture site. Using ultrasound guidance, access into the right common femoral vein was obtained with visualization of needle entry into the vessel using a standard micropuncture technique. A wire was advanced into the IVC insert all fascial dilation performed. An 8 [place], 11 cm vascular sheath was placed into the external iliac vein. Through this access site, an 60 [place] ICE catheter was advanced with ease under fluoroscopic guidance to the level of the intrahepatic inferior vena cava. A preliminary ultrasound of the right neck was performed and demonstrates a patent internal jugular vein. A permanent ultrasound image was recorded. Using a combination of fluoroscopy and ultrasound, an access site was determined. A small dermatotomy was made at the planned puncture site. Using ultrasound guidance, access into the right internal jugular vein was obtained with visualization of needle entry into the vessel using a standard micropuncture technique. A wire was advanced into the IVC and serial fascial dilation performed. A 10 French Gore dry seal sheath was placed into the internal jugular vein and advanced to the IVC. The jugular sheath was retracted into the right atrium and manometry was performed. Right atrial mean pressure was 16 mm Hg. A 5 French angled tip catheter was then directed into the right hepatic vein. Hepatic venogram was performed. These images demonstrated patent hepatic vein with no stenosis. The catheter was advanced to a wedge portion of the a patent vein over which the 10 French sheath was advanced into the right hepatic vein. Using ICE ultrasound visualization the catheter as  right hepatic vein as well as the portal anatomy was defined. A planned exit site from the hepatic vein and puncture site from  the portal vein was placed into a single sonographic plane. Under direct ultrasound visualization, the ScorpionX needle was advanced into the central right portal vein. Hand injection of contrast confirmed position within the portal system. A Glidewire Advantage was then advanced into the splenic vein. A 5 French marking pigtail catheter was then advanced over the wire into the main portal vein and wire removed. Portal venogram was performed which demonstrated complete hepatofugal flow in the portal vein, primarily through the massive splenorenal shunt and into the inferior vena cava. Portal manometry was performed which demonstrated a mean portal pressure of 21 mm Hg. Next, a V 18 wire was inserted through the pigtail catheter which was removed. Adjacent to the safety wire, an angled tip Navicross catheter and a Glidewire Advantage were directed to the inferior vena cava and used to select the left renal vein. The catheter was exchanged for a C2 in order to select the splenorenal shunt. Venogram demonstrated brisk flow into the left renal vein which was severely dilated. Coil embolization was then performed with an assortment of 0.035 inch Azur coils. Pigtail catheter was then reinserted into the main portal/central splenic vein. Venogram was again performed which demonstrated persistent patency of the splenorenal shunt and no evidence of increased antegrade flow. Portal manometry was again performed demonstrating a mean portal pressure increased to 30 mm Hg. Therefore, intravascular ultrasound was performed to interrogate the portal vein for possible clot or stenosis. There is a focal wall adherent echogenic chronic thrombus in the mid main portal vein with significantly smaller luminal diameter than just proximal distal to the stenosis. Therefore, balloon angioplasty was performed with a 14 mm by 4 cm atlas balloon. Repeat intravascular ultrasound demonstrated improved luminal diameter with disruption of the  chronic thrombus. No evidence of dissection. Repeat venogram demonstrated persistent hepatofugal flow through the portal vein and into the splenorenal shunt. Therefore, from the splenic vein approach, the tortuous portion of the splenorenal shunt near the indwelling coil pack was accessed with a Navicross catheter. An additional assortment of 0.035 inch Azur coils were placed. Repeat venogram demonstrated persistent patency. Therefore, a Progreat microcatheter was inserted and 60 cm followed by a 45 cm Penumbra packing coils were deployed. At this point, retrograde flow to splenorenal shunt was slowed, however stool present. Several gastric veins within beginning to fill that were not previously visualized. Given these findings, in an attempt to improve antegrade flow, TIPS endograft deployment was pursued. Under direct fluoroscopic visualization, an 8-10 mm, 7+ 2 cm Viatorr endograft was deployed. Post deployment venogram demonstrated occlusion of the indwelling endograft, likely secondary to stenosis in the intraparenchymal tract. Therefore, balloon angioplasty of the stenotic area was performed with a 6 mm x 2 cm mustang balloon. Repeat portal venogram demonstrated patency of the endograft with brisk antegrade flow 1 injected from the main portal vein. Additional splenic venogram was performed after TIPS balloon molding which demonstrated persistent retrograde flow through the splenorenal shunt in indwelling coil packs. Therefore, retrograde access to the splenorenal shunt was again pursued from the left renal vein. There was an attempt to place a 7 French sheath into the splenorenal shunt in hopes of deployment of a 14 [place] Amplatzer plug. However due to tortuosity, after multiple attempts to gain stable access, cannulation was not successful. The catheters and sheath were removed and manual compression was applied to the  right internal jugular and right common femoral venous access sites until hemostasis was  achieved. The patient was transferred to the PACU in stable condition. IMPRESSION: 1. Complete hepatopetal portal venous flow, the majority exiting via massive splenorenal shunt. 2. Coil embolization of the splenorenal shunt. There is still some retrograde flow through the indwelling coil packs in the splenorenal shunt upon completion, hopeful for these to continue to thrombose over time and reverse to hepatopetal flow. 3. Balloon angioplasty of the main portal vein. 4. Small TIPS placement with post deployment balloon molding to 6 mm. 5. Ultrasound-guided paracentesis yielding 3 L ascitic fluid. Marliss Coots, MD [facility] Radiology Electronically Signed   By: Marliss Coots M.D.   On: 10/09/2021 17:02   IR Angiogram Selective Each Additional Vessel  Result Date: 10/09/2021 CLINICAL DATA:  71 year old male with NASH cirrhosis (Child Pugh C) and severe portal hypertension exacerbated by main portal vein stenosis, likely from chronic non-occlusive thrombus, in addition to hepatofugal portal flow due to massive splenorenal shunt. Plan for TIPS approach to angioplasty portal vein with possible stent placement, followed by embolization of splenorenal shunt in hopes to establish antegrade portal flow to improve what liver function he may have left. EXAM: 1. Ultrasound-guided paracentesis 2. Ultrasound-guided access of the right internal jugular vein 3. Ultrasound-guided access of the right common femoral vein 4. Hepatic venogram 5. Intravascular ultrasound 6. Catheterization of the portal vein 7. Portal venous and central manometry 8. Portal venogram 9. Coil embolization of splenorenal shunt 10. Angioplasty of the main portal vein 11. Creation of a transhepatic portal vein to hepatic vein shunt MEDICATIONS: As antibiotic prophylaxis, Rocephin 1 gm IV was ordered pre-procedure and administered intravenously within one hour of incision. ANESTHESIA/SEDATION: General - as  administered by the Anesthesia department CONTRAST:  One hundred fifty ML Omnipaque 300, intravenous FLUOROSCOPY TIME:  Fluoroscopy Time: 54.3 minutes (1,312 mGy). COMPLICATIONS: None immediate. PROCEDURE: Informed written consent was obtained from the patient after a thorough discussion of the procedural risks, benefits and alternatives. All questions were addressed. Maximal Sterile Barrier Technique was utilized including caps, mask, sterile gowns, sterile gloves, sterile drape, hand hygiene and skin antiseptic. A timeout was performed prior to the initiation of the procedure. The right lower quadrant was prepped and draped in standard fashion. Preprocedure ultrasound evaluation demonstrated large volume ascites. A small skin nick was made. Under direct ultrasound visualization, a 10 cm, 19 gauge Yueh needle was directed into the ascitic fluid in the needle was removed. Throughout the procedure, a total of 3 L of translucent, amber fluid were removed. A preliminary ultrasound of the right groin was performed and demonstrates a patent right common femoral vein. A permanent ultrasound image was recorded. Using a combination of fluoroscopy and ultrasound, an access site was determined. A small dermatotomy was made at the planned puncture site. Using ultrasound guidance, access into the right common femoral vein was obtained with visualization of needle entry into the vessel using a standard micropuncture technique. A wire was advanced into the IVC insert all fascial dilation performed. An 8 [place], 11 cm vascular sheath was placed into the external iliac vein. Through this access site, an 88 [place] ICE catheter was advanced with ease under fluoroscopic guidance to the level of the intrahepatic inferior vena cava. A preliminary ultrasound of the right neck was performed and demonstrates a patent internal jugular vein. A permanent ultrasound image was recorded. Using a combination of fluoroscopy and ultrasound,  an access  site was determined. A small dermatotomy was made at the planned puncture site. Using ultrasound guidance, access into the right internal jugular vein was obtained with visualization of needle entry into the vessel using a standard micropuncture technique. A wire was advanced into the IVC and serial fascial dilation performed. A 10 French Gore dry seal sheath was placed into the internal jugular vein and advanced to the IVC. The jugular sheath was retracted into the right atrium and manometry was performed. Right atrial mean pressure was 16 mm Hg. A 5 French angled tip catheter was then directed into the right hepatic vein. Hepatic venogram was performed. These images demonstrated patent hepatic vein with no stenosis. The catheter was advanced to a wedge portion of the a patent vein over which the 10 French sheath was advanced into the right hepatic vein. Using ICE ultrasound visualization the catheter as right hepatic vein as well as the portal anatomy was defined. A planned exit site from the hepatic vein and puncture site from the portal vein was placed into a single sonographic plane. Under direct ultrasound visualization, the ScorpionX needle was advanced into the central right portal vein. Hand injection of contrast confirmed position within the portal system. A Glidewire Advantage was then advanced into the splenic vein. A 5 French marking pigtail catheter was then advanced over the wire into the main portal vein and wire removed. Portal venogram was performed which demonstrated complete hepatofugal flow in the portal vein, primarily through the massive splenorenal shunt and into the inferior vena cava. Portal manometry was performed which demonstrated a mean portal pressure of 21 mm Hg. Next, a V 18 wire was inserted through the pigtail catheter which was removed. Adjacent to the safety wire, an angled tip Navicross catheter and a Glidewire Advantage were directed to the inferior vena cava and used  to select the left renal vein. The catheter was exchanged for a C2 in order to select the splenorenal shunt. Venogram demonstrated brisk flow into the left renal vein which was severely dilated. Coil embolization was then performed with an assortment of 0.035 inch Azur coils. Pigtail catheter was then reinserted into the main portal/central splenic vein. Venogram was again performed which demonstrated persistent patency of the splenorenal shunt and no evidence of increased antegrade flow. Portal manometry was again performed demonstrating a mean portal pressure increased to 30 mm Hg. Therefore, intravascular ultrasound was performed to interrogate the portal vein for possible clot or stenosis. There is a focal wall adherent echogenic chronic thrombus in the mid main portal vein with significantly smaller luminal diameter than just proximal distal to the stenosis. Therefore, balloon angioplasty was performed with a 14 mm by 4 cm atlas balloon. Repeat intravascular ultrasound demonstrated improved luminal diameter with disruption of the chronic thrombus. No evidence of dissection. Repeat venogram demonstrated persistent hepatofugal flow through the portal vein and into the splenorenal shunt. Therefore, from the splenic vein approach, the tortuous portion of the splenorenal shunt near the indwelling coil pack was accessed with a Navicross catheter. An additional assortment of 0.035 inch Azur coils were placed. Repeat venogram demonstrated persistent patency. Therefore, a Progreat microcatheter was inserted and 60 cm followed by a 45 cm Penumbra packing coils were deployed. At this point, retrograde flow to splenorenal shunt was slowed, however stool present. Several gastric veins within beginning to fill that were not previously visualized. Given these findings, in an attempt to improve antegrade flow, TIPS endograft deployment was pursued. Under direct fluoroscopic visualization, an 8-10 mm, 7+  2 cm Viatorr endograft  was deployed. Post deployment venogram demonstrated occlusion of the indwelling endograft, likely secondary to stenosis in the intraparenchymal tract. Therefore, balloon angioplasty of the stenotic area was performed with a 6 mm x 2 cm mustang balloon. Repeat portal venogram demonstrated patency of the endograft with brisk antegrade flow 1 injected from the main portal vein. Additional splenic venogram was performed after TIPS balloon molding which demonstrated persistent retrograde flow through the splenorenal shunt in indwelling coil packs. Therefore, retrograde access to the splenorenal shunt was again pursued from the left renal vein. There was an attempt to place a 7 French sheath into the splenorenal shunt in hopes of deployment of a 14 [place] Amplatzer plug. However due to tortuosity, after multiple attempts to gain stable access, cannulation was not successful. The catheters and sheath were removed and manual compression was applied to the right internal jugular and right common femoral venous access sites until hemostasis was achieved. The patient was transferred to the PACU in stable condition. IMPRESSION: 1. Complete hepatopetal portal venous flow, the majority exiting via massive splenorenal shunt. 2. Coil embolization of the splenorenal shunt. There is still some retrograde flow through the indwelling coil packs in the splenorenal shunt upon completion, hopeful for these to continue to thrombose over time and reverse to hepatopetal flow. 3. Balloon angioplasty of the main portal vein. 4. Small TIPS placement with post deployment balloon molding to 6 mm. 5. Ultrasound-guided paracentesis yielding 3 L ascitic fluid. Marliss Coots, MD [facility] Radiology Electronically Signed   By: Marliss Coots M.D.   On: 10/09/2021 17:02   IR Angiogram Selective Each Additional Vessel  Result Date: 10/09/2021 CLINICAL DATA:  71 year old male with NASH cirrhosis (Child  Pugh C) and severe portal hypertension exacerbated by main portal vein stenosis, likely from chronic non-occlusive thrombus, in addition to hepatofugal portal flow due to massive splenorenal shunt. Plan for TIPS approach to angioplasty portal vein with possible stent placement, followed by embolization of splenorenal shunt in hopes to establish antegrade portal flow to improve what liver function he may have left. EXAM: 1. Ultrasound-guided paracentesis 2. Ultrasound-guided access of the right internal jugular vein 3. Ultrasound-guided access of the right common femoral vein 4. Hepatic venogram 5. Intravascular ultrasound 6. Catheterization of the portal vein 7. Portal venous and central manometry 8. Portal venogram 9. Coil embolization of splenorenal shunt 10. Angioplasty of the main portal vein 11. Creation of a transhepatic portal vein to hepatic vein shunt MEDICATIONS: As antibiotic prophylaxis, Rocephin 1 gm IV was ordered pre-procedure and administered intravenously within one hour of incision. ANESTHESIA/SEDATION: General - as administered by the Anesthesia department CONTRAST:  One hundred fifty ML Omnipaque 300, intravenous FLUOROSCOPY TIME:  Fluoroscopy Time: 54.3 minutes (1,312 mGy). COMPLICATIONS: None immediate. PROCEDURE: Informed written consent was obtained from the patient after a thorough discussion of the procedural risks, benefits and alternatives. All questions were addressed. Maximal Sterile Barrier Technique was utilized including caps, mask, sterile gowns, sterile gloves, sterile drape, hand hygiene and skin antiseptic. A timeout was performed prior to the initiation of the procedure. The right lower quadrant was prepped and draped in standard fashion. Preprocedure ultrasound evaluation demonstrated large volume ascites. A small skin nick was made. Under direct ultrasound visualization, a 10 cm, 19 gauge Yueh needle was directed into the ascitic fluid in the needle was removed. Throughout the  procedure, a total of 3 L of translucent, amber fluid were removed. A preliminary ultrasound of  the right groin was performed and demonstrates a patent right common femoral vein. A permanent ultrasound image was recorded. Using a combination of fluoroscopy and ultrasound, an access site was determined. A small dermatotomy was made at the planned puncture site. Using ultrasound guidance, access into the right common femoral vein was obtained with visualization of needle entry into the vessel using a standard micropuncture technique. A wire was advanced into the IVC insert all fascial dilation performed. An 8 [place], 11 cm vascular sheath was placed into the external iliac vein. Through this access site, an 84 [place] ICE catheter was advanced with ease under fluoroscopic guidance to the level of the intrahepatic inferior vena cava. A preliminary ultrasound of the right neck was performed and demonstrates a patent internal jugular vein. A permanent ultrasound image was recorded. Using a combination of fluoroscopy and ultrasound, an access site was determined. A small dermatotomy was made at the planned puncture site. Using ultrasound guidance, access into the right internal jugular vein was obtained with visualization of needle entry into the vessel using a standard micropuncture technique. A wire was advanced into the IVC and serial fascial dilation performed. A 10 French Gore dry seal sheath was placed into the internal jugular vein and advanced to the IVC. The jugular sheath was retracted into the right atrium and manometry was performed. Right atrial mean pressure was 16 mm Hg. A 5 French angled tip catheter was then directed into the right hepatic vein. Hepatic venogram was performed. These images demonstrated patent hepatic vein with no stenosis. The catheter was advanced to a wedge portion of the a patent vein over which the 10 French sheath was advanced into the right hepatic vein. Using ICE ultrasound  visualization the catheter as right hepatic vein as well as the portal anatomy was defined. A planned exit site from the hepatic vein and puncture site from the portal vein was placed into a single sonographic plane. Under direct ultrasound visualization, the ScorpionX needle was advanced into the central right portal vein. Hand injection of contrast confirmed position within the portal system. A Glidewire Advantage was then advanced into the splenic vein. A 5 French marking pigtail catheter was then advanced over the wire into the main portal vein and wire removed. Portal venogram was performed which demonstrated complete hepatofugal flow in the portal vein, primarily through the massive splenorenal shunt and into the inferior vena cava. Portal manometry was performed which demonstrated a mean portal pressure of 21 mm Hg. Next, a V 18 wire was inserted through the pigtail catheter which was removed. Adjacent to the safety wire, an angled tip Navicross catheter and a Glidewire Advantage were directed to the inferior vena cava and used to select the left renal vein. The catheter was exchanged for a C2 in order to select the splenorenal shunt. Venogram demonstrated brisk flow into the left renal vein which was severely dilated. Coil embolization was then performed with an assortment of 0.035 inch Azur coils. Pigtail catheter was then reinserted into the main portal/central splenic vein. Venogram was again performed which demonstrated persistent patency of the splenorenal shunt and no evidence of increased antegrade flow. Portal manometry was again performed demonstrating a mean portal pressure increased to 30 mm Hg. Therefore, intravascular ultrasound was performed to interrogate the portal vein for possible clot or stenosis. There is a focal wall adherent echogenic chronic thrombus in the mid main portal vein with significantly smaller luminal diameter than just proximal distal to the stenosis. Therefore, balloon  angioplasty was performed with a 14 mm by 4 cm atlas balloon. Repeat intravascular ultrasound demonstrated improved luminal diameter with disruption of the chronic thrombus. No evidence of dissection. Repeat venogram demonstrated persistent hepatofugal flow through the portal vein and into the splenorenal shunt. Therefore, from the splenic vein approach, the tortuous portion of the splenorenal shunt near the indwelling coil pack was accessed with a Navicross catheter. An additional assortment of 0.035 inch Azur coils were placed. Repeat venogram demonstrated persistent patency. Therefore, a Progreat microcatheter was inserted and 60 cm followed by a 45 cm Penumbra packing coils were deployed. At this point, retrograde flow to splenorenal shunt was slowed, however stool present. Several gastric veins within beginning to fill that were not previously visualized. Given these findings, in an attempt to improve antegrade flow, TIPS endograft deployment was pursued. Under direct fluoroscopic visualization, an 8-10 mm, 7+ 2 cm Viatorr endograft was deployed. Post deployment venogram demonstrated occlusion of the indwelling endograft, likely secondary to stenosis in the intraparenchymal tract. Therefore, balloon angioplasty of the stenotic area was performed with a 6 mm x 2 cm mustang balloon. Repeat portal venogram demonstrated patency of the endograft with brisk antegrade flow 1 injected from the main portal vein. Additional splenic venogram was performed after TIPS balloon molding which demonstrated persistent retrograde flow through the splenorenal shunt in indwelling coil packs. Therefore, retrograde access to the splenorenal shunt was again pursued from the left renal vein. There was an attempt to place a 7 French sheath into the splenorenal shunt in hopes of deployment of a 14 [place] Amplatzer plug. However due to tortuosity, after multiple attempts to gain stable access, cannulation was not successful. The catheters  and sheath were removed and manual compression was applied to the right internal jugular and right common femoral venous access sites until hemostasis was achieved. The patient was transferred to the PACU in stable condition. IMPRESSION: 1. Complete hepatopetal portal venous flow, the majority exiting via massive splenorenal shunt. 2. Coil embolization of the splenorenal shunt. There is still some retrograde flow through the indwelling coil packs in the splenorenal shunt upon completion, hopeful for these to continue to thrombose over time and reverse to hepatopetal flow. 3. Balloon angioplasty of the main portal vein. 4. Small TIPS placement with post deployment balloon molding to 6 mm. 5. Ultrasound-guided paracentesis yielding 3 L ascitic fluid. Marliss Coots, MD [facility] Radiology Electronically Signed   By: Marliss Coots M.D.   On: 10/09/2021 17:02   IR Paracentesis  Result Date: 10/09/2021 CLINICAL DATA:  71 year old male with NASH cirrhosis (Child Pugh C) and severe portal hypertension exacerbated by main portal vein stenosis, likely from chronic non-occlusive thrombus, in addition to hepatofugal portal flow due to massive splenorenal shunt. Plan for TIPS approach to angioplasty portal vein with possible stent placement, followed by embolization of splenorenal shunt in hopes to establish antegrade portal flow to improve what liver function he may have left. EXAM: 1. Ultrasound-guided paracentesis 2. Ultrasound-guided access of the right internal jugular vein 3. Ultrasound-guided access of the right common femoral vein 4. Hepatic venogram 5. Intravascular ultrasound 6. Catheterization of the portal vein 7. Portal venous and central manometry 8. Portal venogram 9. Coil embolization of splenorenal shunt 10. Angioplasty of the main portal vein 11. Creation of a transhepatic portal vein to hepatic vein shunt MEDICATIONS: As antibiotic prophylaxis, Rocephin 1  gm IV was ordered pre-procedure and administered intravenously within one hour of incision. ANESTHESIA/SEDATION: General - as  administered by the Anesthesia department CONTRAST:  One hundred fifty ML Omnipaque 300, intravenous FLUOROSCOPY TIME:  Fluoroscopy Time: 54.3 minutes (1,312 mGy). COMPLICATIONS: None immediate. PROCEDURE: Informed written consent was obtained from the patient after a thorough discussion of the procedural risks, benefits and alternatives. All questions were addressed. Maximal Sterile Barrier Technique was utilized including caps, mask, sterile gowns, sterile gloves, sterile drape, hand hygiene and skin antiseptic. A timeout was performed prior to the initiation of the procedure. The right lower quadrant was prepped and draped in standard fashion. Preprocedure ultrasound evaluation demonstrated large volume ascites. A small skin nick was made. Under direct ultrasound visualization, a 10 cm, 19 gauge Yueh needle was directed into the ascitic fluid in the needle was removed. Throughout the procedure, a total of 3 L of translucent, amber fluid were removed. A preliminary ultrasound of the right groin was performed and demonstrates a patent right common femoral vein. A permanent ultrasound image was recorded. Using a combination of fluoroscopy and ultrasound, an access site was determined. A small dermatotomy was made at the planned puncture site. Using ultrasound guidance, access into the right common femoral vein was obtained with visualization of needle entry into the vessel using a standard micropuncture technique. A wire was advanced into the IVC insert all fascial dilation performed. An 8 [place], 11 cm vascular sheath was placed into the external iliac vein. Through this access site, an 67 [place] ICE catheter was advanced with ease under fluoroscopic guidance to the level of the intrahepatic inferior vena cava. A preliminary ultrasound of the right neck was performed and demonstrates  a patent internal jugular vein. A permanent ultrasound image was recorded. Using a combination of fluoroscopy and ultrasound, an access site was determined. A small dermatotomy was made at the planned puncture site. Using ultrasound guidance, access into the right internal jugular vein was obtained with visualization of needle entry into the vessel using a standard micropuncture technique. A wire was advanced into the IVC and serial fascial dilation performed. A 10 French Gore dry seal sheath was placed into the internal jugular vein and advanced to the IVC. The jugular sheath was retracted into the right atrium and manometry was performed. Right atrial mean pressure was 16 mm Hg. A 5 French angled tip catheter was then directed into the right hepatic vein. Hepatic venogram was performed. These images demonstrated patent hepatic vein with no stenosis. The catheter was advanced to a wedge portion of the a patent vein over which the 10 French sheath was advanced into the right hepatic vein. Using ICE ultrasound visualization the catheter as right hepatic vein as well as the portal anatomy was defined. A planned exit site from the hepatic vein and puncture site from the portal vein was placed into a single sonographic plane. Under direct ultrasound visualization, the ScorpionX needle was advanced into the central right portal vein. Hand injection of contrast confirmed position within the portal system. A Glidewire Advantage was then advanced into the splenic vein. A 5 French marking pigtail catheter was then advanced over the wire into the main portal vein and wire removed. Portal venogram was performed which demonstrated complete hepatofugal flow in the portal vein, primarily through the massive splenorenal shunt and into the inferior vena cava. Portal manometry was performed which demonstrated a mean portal pressure of 21 mm Hg. Next, a V 18 wire was inserted through the pigtail catheter which was removed. Adjacent  to the safety wire, an angled tip Navicross catheter and a Glidewire  Advantage were directed to the inferior vena cava and used to select the left renal vein. The catheter was exchanged for a C2 in order to select the splenorenal shunt. Venogram demonstrated brisk flow into the left renal vein which was severely dilated. Coil embolization was then performed with an assortment of 0.035 inch Azur coils. Pigtail catheter was then reinserted into the main portal/central splenic vein. Venogram was again performed which demonstrated persistent patency of the splenorenal shunt and no evidence of increased antegrade flow. Portal manometry was again performed demonstrating a mean portal pressure increased to 30 mm Hg. Therefore, intravascular ultrasound was performed to interrogate the portal vein for possible clot or stenosis. There is a focal wall adherent echogenic chronic thrombus in the mid main portal vein with significantly smaller luminal diameter than just proximal distal to the stenosis. Therefore, balloon angioplasty was performed with a 14 mm by 4 cm atlas balloon. Repeat intravascular ultrasound demonstrated improved luminal diameter with disruption of the chronic thrombus. No evidence of dissection. Repeat venogram demonstrated persistent hepatofugal flow through the portal vein and into the splenorenal shunt. Therefore, from the splenic vein approach, the tortuous portion of the splenorenal shunt near the indwelling coil pack was accessed with a Navicross catheter. An additional assortment of 0.035 inch Azur coils were placed. Repeat venogram demonstrated persistent patency. Therefore, a Progreat microcatheter was inserted and 60 cm followed by a 45 cm Penumbra packing coils were deployed. At this point, retrograde flow to splenorenal shunt was slowed, however stool present. Several gastric veins within beginning to fill that were not previously visualized. Given these findings, in an attempt to improve  antegrade flow, TIPS endograft deployment was pursued. Under direct fluoroscopic visualization, an 8-10 mm, 7+ 2 cm Viatorr endograft was deployed. Post deployment venogram demonstrated occlusion of the indwelling endograft, likely secondary to stenosis in the intraparenchymal tract. Therefore, balloon angioplasty of the stenotic area was performed with a 6 mm x 2 cm mustang balloon. Repeat portal venogram demonstrated patency of the endograft with brisk antegrade flow 1 injected from the main portal vein. Additional splenic venogram was performed after TIPS balloon molding which demonstrated persistent retrograde flow through the splenorenal shunt in indwelling coil packs. Therefore, retrograde access to the splenorenal shunt was again pursued from the left renal vein. There was an attempt to place a 7 French sheath into the splenorenal shunt in hopes of deployment of a 14 [place] Amplatzer plug. However due to tortuosity, after multiple attempts to gain stable access, cannulation was not successful. The catheters and sheath were removed and manual compression was applied to the right internal jugular and right common femoral venous access sites until hemostasis was achieved. The patient was transferred to the PACU in stable condition. IMPRESSION: 1. Complete hepatopetal portal venous flow, the majority exiting via massive splenorenal shunt. 2. Coil embolization of the splenorenal shunt. There is still some retrograde flow through the indwelling coil packs in the splenorenal shunt upon completion, hopeful for these to continue to thrombose over time and reverse to hepatopetal flow. 3. Balloon angioplasty of the main portal vein. 4. Small TIPS placement with post deployment balloon molding to 6 mm. 5. Ultrasound-guided paracentesis yielding 3 L ascitic fluid. Marliss Coots, MD [facility] Radiology Electronically Signed   By: Marliss Coots M.D.   On: 10/09/2021 17:02    IR EMBO VENOUS NOT HEMORR HEMANG  INC GUIDE ROADMAPPING  Result Date: 10/09/2021 CLINICAL DATA:  71 year old male with NASH cirrhosis (  Child Pugh C) and severe portal hypertension exacerbated by main portal vein stenosis, likely from chronic non-occlusive thrombus, in addition to hepatofugal portal flow due to massive splenorenal shunt. Plan for TIPS approach to angioplasty portal vein with possible stent placement, followed by embolization of splenorenal shunt in hopes to establish antegrade portal flow to improve what liver function he may have left. EXAM: 1. Ultrasound-guided paracentesis 2. Ultrasound-guided access of the right internal jugular vein 3. Ultrasound-guided access of the right common femoral vein 4. Hepatic venogram 5. Intravascular ultrasound 6. Catheterization of the portal vein 7. Portal venous and central manometry 8. Portal venogram 9. Coil embolization of splenorenal shunt 10. Angioplasty of the main portal vein 11. Creation of a transhepatic portal vein to hepatic vein shunt MEDICATIONS: As antibiotic prophylaxis, Rocephin 1 gm IV was ordered pre-procedure and administered intravenously within one hour of incision. ANESTHESIA/SEDATION: General - as administered by the Anesthesia department CONTRAST:  One hundred fifty ML Omnipaque 300, intravenous FLUOROSCOPY TIME:  Fluoroscopy Time: 54.3 minutes (1,312 mGy). COMPLICATIONS: None immediate. PROCEDURE: Informed written consent was obtained from the patient after a thorough discussion of the procedural risks, benefits and alternatives. All questions were addressed. Maximal Sterile Barrier Technique was utilized including caps, mask, sterile gowns, sterile gloves, sterile drape, hand hygiene and skin antiseptic. A timeout was performed prior to the initiation of the procedure. The right lower quadrant was prepped and draped in standard fashion. Preprocedure ultrasound evaluation demonstrated large volume ascites. A small skin nick was made.  Under direct ultrasound visualization, a 10 cm, 19 gauge Yueh needle was directed into the ascitic fluid in the needle was removed. Throughout the procedure, a total of 3 L of translucent, amber fluid were removed. A preliminary ultrasound of the right groin was performed and demonstrates a patent right common femoral vein. A permanent ultrasound image was recorded. Using a combination of fluoroscopy and ultrasound, an access site was determined. A small dermatotomy was made at the planned puncture site. Using ultrasound guidance, access into the right common femoral vein was obtained with visualization of needle entry into the vessel using a standard micropuncture technique. A wire was advanced into the IVC insert all fascial dilation performed. An 8 [place], 11 cm vascular sheath was placed into the external iliac vein. Through this access site, an 71 [place] ICE catheter was advanced with ease under fluoroscopic guidance to the level of the intrahepatic inferior vena cava. A preliminary ultrasound of the right neck was performed and demonstrates a patent internal jugular vein. A permanent ultrasound image was recorded. Using a combination of fluoroscopy and ultrasound, an access site was determined. A small dermatotomy was made at the planned puncture site. Using ultrasound guidance, access into the right internal jugular vein was obtained with visualization of needle entry into the vessel using a standard micropuncture technique. A wire was advanced into the IVC and serial fascial dilation performed. A 10 French Gore dry seal sheath was placed into the internal jugular vein and advanced to the IVC. The jugular sheath was retracted into the right atrium and manometry was performed. Right atrial mean pressure was 16 mm Hg. A 5 French angled tip catheter was then directed into the right hepatic vein. Hepatic venogram was performed. These images demonstrated patent hepatic vein with no stenosis. The catheter  was advanced to a wedge portion of the a patent vein over which the 10 French sheath was advanced into the right hepatic vein. Using ICE ultrasound visualization the catheter as right  hepatic vein as well as the portal anatomy was defined. A planned exit site from the hepatic vein and puncture site from the portal vein was placed into a single sonographic plane. Under direct ultrasound visualization, the ScorpionX needle was advanced into the central right portal vein. Hand injection of contrast confirmed position within the portal system. A Glidewire Advantage was then advanced into the splenic vein. A 5 French marking pigtail catheter was then advanced over the wire into the main portal vein and wire removed. Portal venogram was performed which demonstrated complete hepatofugal flow in the portal vein, primarily through the massive splenorenal shunt and into the inferior vena cava. Portal manometry was performed which demonstrated a mean portal pressure of 21 mm Hg. Next, a V 18 wire was inserted through the pigtail catheter which was removed. Adjacent to the safety wire, an angled tip Navicross catheter and a Glidewire Advantage were directed to the inferior vena cava and used to select the left renal vein. The catheter was exchanged for a C2 in order to select the splenorenal shunt. Venogram demonstrated brisk flow into the left renal vein which was severely dilated. Coil embolization was then performed with an assortment of 0.035 inch Azur coils. Pigtail catheter was then reinserted into the main portal/central splenic vein. Venogram was again performed which demonstrated persistent patency of the splenorenal shunt and no evidence of increased antegrade flow. Portal manometry was again performed demonstrating a mean portal pressure increased to 30 mm Hg. Therefore, intravascular ultrasound was performed to interrogate the portal vein for possible clot or stenosis. There is a focal wall adherent echogenic  chronic thrombus in the mid main portal vein with significantly smaller luminal diameter than just proximal distal to the stenosis. Therefore, balloon angioplasty was performed with a 14 mm by 4 cm atlas balloon. Repeat intravascular ultrasound demonstrated improved luminal diameter with disruption of the chronic thrombus. No evidence of dissection. Repeat venogram demonstrated persistent hepatofugal flow through the portal vein and into the splenorenal shunt. Therefore, from the splenic vein approach, the tortuous portion of the splenorenal shunt near the indwelling coil pack was accessed with a Navicross catheter. An additional assortment of 0.035 inch Azur coils were placed. Repeat venogram demonstrated persistent patency. Therefore, a Progreat microcatheter was inserted and 60 cm followed by a 45 cm Penumbra packing coils were deployed. At this point, retrograde flow to splenorenal shunt was slowed, however stool present. Several gastric veins within beginning to fill that were not previously visualized. Given these findings, in an attempt to improve antegrade flow, TIPS endograft deployment was pursued. Under direct fluoroscopic visualization, an 8-10 mm, 7+ 2 cm Viatorr endograft was deployed. Post deployment venogram demonstrated occlusion of the indwelling endograft, likely secondary to stenosis in the intraparenchymal tract. Therefore, balloon angioplasty of the stenotic area was performed with a 6 mm x 2 cm mustang balloon. Repeat portal venogram demonstrated patency of the endograft with brisk antegrade flow 1 injected from the main portal vein. Additional splenic venogram was performed after TIPS balloon molding which demonstrated persistent retrograde flow through the splenorenal shunt in indwelling coil packs. Therefore, retrograde access to the splenorenal shunt was again pursued from the left renal vein. There was an attempt to place a 7 French sheath into the splenorenal shunt in hopes of deployment  of a 14 [place] Amplatzer plug. However due to tortuosity, after multiple attempts to gain stable access, cannulation was not successful. The catheters and sheath were removed and manual compression was applied to the right  internal jugular and right common femoral venous access sites until hemostasis was achieved. The patient was transferred to the PACU in stable condition. IMPRESSION: 1. Complete hepatopetal portal venous flow, the majority exiting via massive splenorenal shunt. 2. Coil embolization of the splenorenal shunt. There is still some retrograde flow through the indwelling coil packs in the splenorenal shunt upon completion, hopeful for these to continue to thrombose over time and reverse to hepatopetal flow. 3. Balloon angioplasty of the main portal vein. 4. Small TIPS placement with post deployment balloon molding to 6 mm. 5. Ultrasound-guided paracentesis yielding 3 L ascitic fluid. Marliss Coots, MD [facility] Electronically Signed   By: Marliss Coots M.D.   On: 10/09/2021 17:02   IR INTRAVASCULAR ULTRASOUND NON CORONARY  Result Date: 10/09/2021 CLINICAL DATA:  71 year old male with NASH cirrhosis (Child Pugh C) and severe portal hypertension exacerbated by main portal vein stenosis, likely from chronic non-occlusive thrombus, in addition to hepatofugal portal flow due to massive splenorenal shunt. Plan for TIPS approach to angioplasty portal vein with possible stent placement, followed by embolization of splenorenal shunt in hopes to establish antegrade portal flow to improve what liver function he may have left. EXAM: 1. Ultrasound-guided paracentesis 2. Ultrasound-guided access of the right internal jugular vein 3. Ultrasound-guided access of the right common femoral vein 4. Hepatic venogram 5. Intravascular ultrasound 6. Catheterization of the portal vein 7. Portal venous and central manometry 8. Portal venogram 9. Coil embolization of  splenorenal shunt 10. Angioplasty of the main portal vein 11. Creation of a transhepatic portal vein to hepatic vein shunt MEDICATIONS: As antibiotic prophylaxis, Rocephin 1 gm IV was ordered pre-procedure and administered intravenously within one hour of incision. ANESTHESIA/SEDATION: General - as administered by the Anesthesia department CONTRAST:  One hundred fifty ML Omnipaque 300, intravenous FLUOROSCOPY TIME:  Fluoroscopy Time: 54.3 minutes (1,312 mGy). COMPLICATIONS: None immediate. PROCEDURE: Informed written consent was obtained from the patient after a thorough discussion of the procedural risks, benefits and alternatives. All questions were addressed. Maximal Sterile Barrier Technique was utilized including caps, mask, sterile gowns, sterile gloves, sterile drape, hand hygiene and skin antiseptic. A timeout was performed prior to the initiation of the procedure. The right lower quadrant was prepped and draped in standard fashion. Preprocedure ultrasound evaluation demonstrated large volume ascites. A small skin nick was made. Under direct ultrasound visualization, a 10 cm, 19 gauge Yueh needle was directed into the ascitic fluid in the needle was removed. Throughout the procedure, a total of 3 L of translucent, amber fluid were removed. A preliminary ultrasound of the right groin was performed and demonstrates a patent right common femoral vein. A permanent ultrasound image was recorded. Using a combination of fluoroscopy and ultrasound, an access site was determined. A small dermatotomy was made at the planned puncture site. Using ultrasound guidance, access into the right common femoral vein was obtained with visualization of needle entry into the vessel using a standard micropuncture technique. A wire was advanced into the IVC insert all fascial dilation performed. An 8 [place], 11 cm vascular sheath was placed into the external iliac vein. Through this access site, an 3 [place] ICE catheter was  advanced with ease under fluoroscopic guidance to the level of the intrahepatic inferior vena cava. A preliminary ultrasound of the right neck was performed and demonstrates a patent internal jugular vein. A permanent ultrasound image was recorded. Using a combination of fluoroscopy and ultrasound, an access site was determined.  A small dermatotomy was made at the planned puncture site. Using ultrasound guidance, access into the right internal jugular vein was obtained with visualization of needle entry into the vessel using a standard micropuncture technique. A wire was advanced into the IVC and serial fascial dilation performed. A 10 French Gore dry seal sheath was placed into the internal jugular vein and advanced to the IVC. The jugular sheath was retracted into the right atrium and manometry was performed. Right atrial mean pressure was 16 mm Hg. A 5 French angled tip catheter was then directed into the right hepatic vein. Hepatic venogram was performed. These images demonstrated patent hepatic vein with no stenosis. The catheter was advanced to a wedge portion of the a patent vein over which the 10 French sheath was advanced into the right hepatic vein. Using ICE ultrasound visualization the catheter as right hepatic vein as well as the portal anatomy was defined. A planned exit site from the hepatic vein and puncture site from the portal vein was placed into a single sonographic plane. Under direct ultrasound visualization, the ScorpionX needle was advanced into the central right portal vein. Hand injection of contrast confirmed position within the portal system. A Glidewire Advantage was then advanced into the splenic vein. A 5 French marking pigtail catheter was then advanced over the wire into the main portal vein and wire removed. Portal venogram was performed which demonstrated complete hepatofugal flow in the portal vein, primarily through the massive splenorenal shunt and into the inferior vena cava.  Portal manometry was performed which demonstrated a mean portal pressure of 21 mm Hg. Next, a V 18 wire was inserted through the pigtail catheter which was removed. Adjacent to the safety wire, an angled tip Navicross catheter and a Glidewire Advantage were directed to the inferior vena cava and used to select the left renal vein. The catheter was exchanged for a C2 in order to select the splenorenal shunt. Venogram demonstrated brisk flow into the left renal vein which was severely dilated. Coil embolization was then performed with an assortment of 0.035 inch Azur coils. Pigtail catheter was then reinserted into the main portal/central splenic vein. Venogram was again performed which demonstrated persistent patency of the splenorenal shunt and no evidence of increased antegrade flow. Portal manometry was again performed demonstrating a mean portal pressure increased to 30 mm Hg. Therefore, intravascular ultrasound was performed to interrogate the portal vein for possible clot or stenosis. There is a focal wall adherent echogenic chronic thrombus in the mid main portal vein with significantly smaller luminal diameter than just proximal distal to the stenosis. Therefore, balloon angioplasty was performed with a 14 mm by 4 cm atlas balloon. Repeat intravascular ultrasound demonstrated improved luminal diameter with disruption of the chronic thrombus. No evidence of dissection. Repeat venogram demonstrated persistent hepatofugal flow through the portal vein and into the splenorenal shunt. Therefore, from the splenic vein approach, the tortuous portion of the splenorenal shunt near the indwelling coil pack was accessed with a Navicross catheter. An additional assortment of 0.035 inch Azur coils were placed. Repeat venogram demonstrated persistent patency. Therefore, a Progreat microcatheter was inserted and 60 cm followed by a 45 cm Penumbra packing coils were deployed. At this point, retrograde flow to splenorenal  shunt was slowed, however stool present. Several gastric veins within beginning to fill that were not previously visualized. Given these findings, in an attempt to improve antegrade flow, TIPS endograft deployment was pursued. Under direct fluoroscopic visualization, an 8-10 mm, 7+ 2 cm  Viatorr endograft was deployed. Post deployment venogram demonstrated occlusion of the indwelling endograft, likely secondary to stenosis in the intraparenchymal tract. Therefore, balloon angioplasty of the stenotic area was performed with a 6 mm x 2 cm mustang balloon. Repeat portal venogram demonstrated patency of the endograft with brisk antegrade flow 1 injected from the main portal vein. Additional splenic venogram was performed after TIPS balloon molding which demonstrated persistent retrograde flow through the splenorenal shunt in indwelling coil packs. Therefore, retrograde access to the splenorenal shunt was again pursued from the left renal vein. There was an attempt to place a 7 French sheath into the splenorenal shunt in hopes of deployment of a 14 [place] Amplatzer plug. However due to tortuosity, after multiple attempts to gain stable access, cannulation was not successful. The catheters and sheath were removed and manual compression was applied to the right internal jugular and right common femoral venous access sites until hemostasis was achieved. The patient was transferred to the PACU in stable condition. IMPRESSION: 1. Complete hepatopetal portal venous flow, the majority exiting via massive splenorenal shunt. 2. Coil embolization of the splenorenal shunt. There is still some retrograde flow through the indwelling coil packs in the splenorenal shunt upon completion, hopeful for these to continue to thrombose over time and reverse to hepatopetal flow. 3. Balloon angioplasty of the main portal vein. 4. Small TIPS placement with post deployment balloon molding to 6 mm. 5. Ultrasound-guided paracentesis yielding 3 L  ascitic fluid. Marliss Coots, MD [facility] Electronically Signed   By: Marliss Coots M.D.   On: 10/09/2021 17:02   IR INTRAVASCULAR ULTRASOUND NON CORONARY  Result Date: 10/09/2021 CLINICAL DATA:  71 year old male with NASH cirrhosis (Child Pugh C) and severe portal hypertension exacerbated by main portal vein stenosis, likely from chronic non-occlusive thrombus, in addition to hepatofugal portal flow due to massive splenorenal shunt. Plan for TIPS approach to angioplasty portal vein with possible stent placement, followed by embolization of splenorenal shunt in hopes to establish antegrade portal flow to improve what liver function he may have left. EXAM: 1. Ultrasound-guided paracentesis 2. Ultrasound-guided access of the right internal jugular vein 3. Ultrasound-guided access of the right common femoral vein 4. Hepatic venogram 5. Intravascular ultrasound 6. Catheterization of the portal vein 7. Portal venous and central manometry 8. Portal venogram 9. Coil embolization of splenorenal shunt 10. Angioplasty of the main portal vein 11. Creation of a transhepatic portal vein to hepatic vein shunt MEDICATIONS: As antibiotic prophylaxis, Rocephin 1 gm IV was ordered pre-procedure and administered intravenously within one hour of incision. ANESTHESIA/SEDATION: General - as administered by the Anesthesia department CONTRAST:  One hundred fifty ML Omnipaque 300, intravenous FLUOROSCOPY TIME:  Fluoroscopy Time: 54.3 minutes (1,312 mGy). COMPLICATIONS: None immediate. PROCEDURE: Informed written consent was obtained from the patient after a thorough discussion of the procedural risks, benefits and alternatives. All questions were addressed. Maximal Sterile Barrier Technique was utilized including caps, mask, sterile gowns, sterile gloves, sterile drape, hand hygiene and skin antiseptic. A timeout was performed prior to the initiation of the procedure. The  right lower quadrant was prepped and draped in standard fashion. Preprocedure ultrasound evaluation demonstrated large volume ascites. A small skin nick was made. Under direct ultrasound visualization, a 10 cm, 19 gauge Yueh needle was directed into the ascitic fluid in the needle was removed. Throughout the procedure, a total of 3 L of translucent, amber fluid were removed. A preliminary ultrasound of the right groin was  performed and demonstrates a patent right common femoral vein. A permanent ultrasound image was recorded. Using a combination of fluoroscopy and ultrasound, an access site was determined. A small dermatotomy was made at the planned puncture site. Using ultrasound guidance, access into the right common femoral vein was obtained with visualization of needle entry into the vessel using a standard micropuncture technique. A wire was advanced into the IVC insert all fascial dilation performed. An 8 [place], 11 cm vascular sheath was placed into the external iliac vein. Through this access site, an 64 [place] ICE catheter was advanced with ease under fluoroscopic guidance to the level of the intrahepatic inferior vena cava. A preliminary ultrasound of the right neck was performed and demonstrates a patent internal jugular vein. A permanent ultrasound image was recorded. Using a combination of fluoroscopy and ultrasound, an access site was determined. A small dermatotomy was made at the planned puncture site. Using ultrasound guidance, access into the right internal jugular vein was obtained with visualization of needle entry into the vessel using a standard micropuncture technique. A wire was advanced into the IVC and serial fascial dilation performed. A 10 French Gore dry seal sheath was placed into the internal jugular vein and advanced to the IVC. The jugular sheath was retracted into the right atrium and manometry was performed. Right atrial mean pressure was 16 mm Hg. A 5 French angled tip  catheter was then directed into the right hepatic vein. Hepatic venogram was performed. These images demonstrated patent hepatic vein with no stenosis. The catheter was advanced to a wedge portion of the a patent vein over which the 10 French sheath was advanced into the right hepatic vein. Using ICE ultrasound visualization the catheter as right hepatic vein as well as the portal anatomy was defined. A planned exit site from the hepatic vein and puncture site from the portal vein was placed into a single sonographic plane. Under direct ultrasound visualization, the ScorpionX needle was advanced into the central right portal vein. Hand injection of contrast confirmed position within the portal system. A Glidewire Advantage was then advanced into the splenic vein. A 5 French marking pigtail catheter was then advanced over the wire into the main portal vein and wire removed. Portal venogram was performed which demonstrated complete hepatofugal flow in the portal vein, primarily through the massive splenorenal shunt and into the inferior vena cava. Portal manometry was performed which demonstrated a mean portal pressure of 21 mm Hg. Next, a V 18 wire was inserted through the pigtail catheter which was removed. Adjacent to the safety wire, an angled tip Navicross catheter and a Glidewire Advantage were directed to the inferior vena cava and used to select the left renal vein. The catheter was exchanged for a C2 in order to select the splenorenal shunt. Venogram demonstrated brisk flow into the left renal vein which was severely dilated. Coil embolization was then performed with an assortment of 0.035 inch Azur coils. Pigtail catheter was then reinserted into the main portal/central splenic vein. Venogram was again performed which demonstrated persistent patency of the splenorenal shunt and no evidence of increased antegrade flow. Portal manometry was again performed demonstrating a mean portal pressure increased to 30  mm Hg. Therefore, intravascular ultrasound was performed to interrogate the portal vein for possible clot or stenosis. There is a focal wall adherent echogenic chronic thrombus in the mid main portal vein with significantly smaller luminal diameter than just proximal distal to the stenosis. Therefore, balloon angioplasty was performed with  a 14 mm by 4 cm atlas balloon. Repeat intravascular ultrasound demonstrated improved luminal diameter with disruption of the chronic thrombus. No evidence of dissection. Repeat venogram demonstrated persistent hepatofugal flow through the portal vein and into the splenorenal shunt. Therefore, from the splenic vein approach, the tortuous portion of the splenorenal shunt near the indwelling coil pack was accessed with a Navicross catheter. An additional assortment of 0.035 inch Azur coils were placed. Repeat venogram demonstrated persistent patency. Therefore, a Progreat microcatheter was inserted and 60 cm followed by a 45 cm Penumbra packing coils were deployed. At this point, retrograde flow to splenorenal shunt was slowed, however stool present. Several gastric veins within beginning to fill that were not previously visualized. Given these findings, in an attempt to improve antegrade flow, TIPS endograft deployment was pursued. Under direct fluoroscopic visualization, an 8-10 mm, 7+ 2 cm Viatorr endograft was deployed. Post deployment venogram demonstrated occlusion of the indwelling endograft, likely secondary to stenosis in the intraparenchymal tract. Therefore, balloon angioplasty of the stenotic area was performed with a 6 mm x 2 cm mustang balloon. Repeat portal venogram demonstrated patency of the endograft with brisk antegrade flow 1 injected from the main portal vein. Additional splenic venogram was performed after TIPS balloon molding which demonstrated persistent retrograde flow through the splenorenal shunt in indwelling coil packs. Therefore, retrograde access to the  splenorenal shunt was again pursued from the left renal vein. There was an attempt to place a 7 French sheath into the splenorenal shunt in hopes of deployment of a 14 [place] Amplatzer plug. However due to tortuosity, after multiple attempts to gain stable access, cannulation was not successful. The catheters and sheath were removed and manual compression was applied to the right internal jugular and right common femoral venous access sites until hemostasis was achieved. The patient was transferred to the PACU in stable condition. IMPRESSION: 1. Complete hepatopetal portal venous flow, the majority exiting via massive splenorenal shunt. 2. Coil embolization of the splenorenal shunt. There is still some retrograde flow through the indwelling coil packs in the splenorenal shunt upon completion, hopeful for these to continue to thrombose over time and reverse to hepatopetal flow. 3. Balloon angioplasty of the main portal vein. 4. Small TIPS placement with post deployment balloon molding to 6 mm. 5. Ultrasound-guided paracentesis yielding 3 L ascitic fluid. Marliss Coots, MD [facility] Electronically Signed   By: Marliss Coots M.D.   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 Plan for TIPS approach to angioplasty portal vein with possible stent placement, followed by embolization of splenorenal shunt in hopes to establish antegrade portal flow to improve what liver function he may have left. EXAM: 1. Ultrasound-guided paracentesis 2. Ultrasound-guided access of the right internal jugular vein 3. Ultrasound-guided access of the right common  femoral vein 4. Hepatic venogram 5. Intravascular ultrasound 6. Catheterization of the portal vein 7. Portal venous and central manometry 8. Portal venogram 9. Coil embolization of splenorenal shunt 10. Angioplasty of the main portal vein 11. Creation of a transhepatic portal vein to hepatic vein shunt MEDICATIONS: As antibiotic prophylaxis, Rocephin 1 gm IV was ordered pre-procedure and administered intravenously within one hour of incision.  ANESTHESIA/SEDATION: General - as administered by the Anesthesia department CONTRAST:  One hundred fifty ML Omnipaque 300, intravenous FLUOROSCOPY TIME:  Fluoroscopy Time: 54.3 minutes (1,312 mGy). COMPLICATIONS: None immediate. PROCEDURE: Informed written consent was obtained from the patient after a thorough discussion of the procedural risks, benefits and alternatives. All questions were addressed. Maximal Sterile Barrier Technique was utilized including caps, mask, sterile gowns, sterile gloves, sterile drape, hand hygiene and skin antiseptic. A timeout was performed prior to the initiation of the procedure. The right lower quadrant was prepped and draped in standard fashion. Preprocedure ultrasound evaluation demonstrated large volume ascites. A small skin nick was made. Under direct ultrasound visualization, a 10 cm, 19 gauge Yueh needle was directed into the ascitic fluid in the needle was removed. Throughout the procedure, a total of 3 L of translucent, amber fluid were removed. A preliminary ultrasound of the right groin was performed and demonstrates a patent right common femoral vein. A permanent ultrasound image was recorded. Using a combination of fluoroscopy and ultrasound, an access site was determined. A small dermatotomy was made at the planned puncture site. Using ultrasound guidance, access into the right common femoral vein was obtained with visualization of needle entry into the vessel using a standard micropuncture technique. A wire was advanced into the  IVC insert all fascial dilation performed. An 8 [place], 11 cm vascular sheath was placed into the external iliac vein. Through this access site, an 61 [place] ICE catheter was advanced with ease under fluoroscopic guidance to the level of the intrahepatic inferior vena cava. A preliminary ultrasound of the right neck was performed and demonstrates a patent internal jugular vein. A permanent ultrasound image was recorded. Using a combination of fluoroscopy and ultrasound, an access site was determined. A small dermatotomy was made at the planned puncture site. Using ultrasound guidance, access into the right internal jugular vein was obtained with visualization of needle entry into the vessel using a standard micropuncture technique. A wire was advanced into the IVC and serial fascial dilation performed. A 10 French Gore dry seal sheath was placed into the internal jugular vein and advanced to the IVC. The jugular sheath was retracted into the right atrium and manometry was performed. Right atrial mean pressure was 16 mm Hg. A 5 French angled tip catheter was then directed into the right hepatic vein. Hepatic venogram was performed. These images demonstrated patent hepatic vein with no stenosis. The catheter was advanced to a wedge portion of the a patent vein over which the 10 French sheath was advanced into the right hepatic vein. Using ICE ultrasound visualization the catheter as right hepatic vein as well as the portal anatomy was defined. A planned exit site from the hepatic vein and puncture site from the portal vein was placed into a single sonographic plane. Under direct ultrasound visualization, the ScorpionX needle was advanced into the central right portal vein. Hand injection of contrast confirmed position within the portal system. A Glidewire Advantage was then advanced into the splenic vein. A 5 French marking pigtail catheter was then advanced over the wire into the main portal vein and wire  removed. Portal venogram was performed which demonstrated complete hepatofugal flow in the portal vein, primarily through the massive splenorenal shunt and into the inferior vena cava. Portal manometry was performed which demonstrated a mean portal pressure of 21 mm Hg. Next, a V 18 wire was inserted through the pigtail catheter which was removed. Adjacent to the safety wire, an angled tip Navicross  catheter and a Glidewire Advantage were directed to the inferior vena cava and used to select the left renal vein. The catheter was exchanged for a C2 in order to select the splenorenal shunt. Venogram demonstrated brisk flow into the left renal vein which was severely dilated. Coil embolization was then performed with an assortment of 0.035 inch Azur coils. Pigtail catheter was then reinserted into the main portal/central splenic vein. Venogram was again performed which demonstrated persistent patency of the splenorenal shunt and no evidence of increased antegrade flow. Portal manometry was again performed demonstrating a mean portal pressure increased to 30 mm Hg. Therefore, intravascular ultrasound was performed to interrogate the portal vein for possible clot or stenosis. There is a focal wall adherent echogenic chronic thrombus in the mid main portal vein with significantly smaller luminal diameter than just proximal distal to the stenosis. Therefore, balloon angioplasty was performed with a 14 mm by 4 cm atlas balloon. Repeat intravascular ultrasound demonstrated improved luminal diameter with disruption of the chronic thrombus. No evidence of dissection. Repeat venogram demonstrated persistent hepatofugal flow through the portal vein and into the splenorenal shunt. Therefore, from the splenic vein approach, the tortuous portion of the splenorenal shunt near the indwelling coil pack was accessed with a Navicross catheter. An additional assortment of 0.035 inch Azur coils were placed. Repeat venogram demonstrated  persistent patency. Therefore, a Progreat microcatheter was inserted and 60 cm followed by a 45 cm Penumbra packing coils were deployed. At this point, retrograde flow to splenorenal shunt was slowed, however stool present. Several gastric veins within beginning to fill that were not previously visualized. Given these findings, in an attempt to improve antegrade flow, TIPS endograft deployment was pursued. Under direct fluoroscopic visualization, an 8-10 mm, 7+ 2 cm Viatorr endograft was deployed. Post deployment venogram demonstrated occlusion of the indwelling endograft, likely secondary to stenosis in the intraparenchymal tract. Therefore, balloon angioplasty of the stenotic area was performed with a 6 mm x 2 cm mustang balloon. Repeat portal venogram demonstrated patency of the endograft with brisk antegrade flow 1 injected from the main portal vein. Additional splenic venogram was performed after TIPS balloon molding which demonstrated persistent retrograde flow through the splenorenal shunt in indwelling coil packs. Therefore, retrograde access to the splenorenal shunt was again pursued from the left renal vein. There was an attempt to place a 7 French sheath into the splenorenal shunt in hopes of deployment of a 14 [place] Amplatzer plug. However due to tortuosity, after multiple attempts to gain stable access, cannulation was not successful. The catheters and sheath were removed and manual compression was applied to the right internal jugular and right common femoral venous access sites until hemostasis was achieved. The patient was transferred to the PACU in stable condition. IMPRESSION: 1. Complete hepatopetal portal venous flow, the majority exiting via massive splenorenal shunt. 2. Coil embolization of the splenorenal shunt. There is still some retrograde flow through the indwelling coil packs in the splenorenal shunt upon completion, hopeful for these to continue to thrombose over time and reverse to  hepatopetal flow. 3. Balloon angioplasty of the main portal vein. 4. Small TIPS placement with post deployment balloon molding to 6 mm. 5. Ultrasound-guided paracentesis yielding 3 L ascitic fluid. Marliss Coots, MD [facility] Electronically Signed   By: Marliss Coots M.D.   On: 10/09/2021 17:02   DG CHEST PORT 1 VIEW  Result Date: 10/09/2021 CLINICAL DATA:  Central line placement EXAM: PORTABLE CHEST 1 VIEW  COMPARISON:  Portable exam 1420 hours compared to 10/07/2021 FINDINGS: Tip of endotracheal tube projects 1.8 cm above carina. LEFT jugular central venous catheter tip projects over SVC at the level of the azygous arch. Stable heart size and mediastinal contours. Atherosclerotic calcification aorta. BILATERAL pulmonary infiltrates increased from previous exam. Probable layered RIGHT pleural effusion. No pneumothorax. IMPRESSION: Increased BILATERAL pulmonary infiltrates with probable RIGHT pleural effusion. No pneumothorax following LEFT jugular line placement. Aortic Atherosclerosis (ICD10-I70.0). Electronically Signed   By: Ulyses Southward M.D.   On: 10/09/2021 14:29    Lab Results: Recent Labs    10/09/21 0511 10/09/21 1026 10/09/21 1535 10/09/21 1748 10/10/21 0334  WBC 9.8  --   --  5.5 10.0  HGB 8.0*   < > 7.1* 8.1* 7.6*  HCT 24.1*   < > 21.0* 22.9* 22.1*  PLT 41*  --   --  32* 43*  43*   < > = values in this interval not displayed.   BMET Recent Labs    10/09/21 0039 10/09/21 0511 10/09/21 1026 10/09/21 1304 10/09/21 1535 10/10/21 0334  NA 142 139   < > 140 141 142  K 3.2* 3.3*   < > 3.7 3.7 3.6  CL 109 110  --  104  --  110  CO2 20* 21*  --   --   --  22  GLUCOSE 86 96  --  130*  --  129*  BUN 94* 98*  --  99*  --  95*  CREATININE 2.76* 2.74*  --  2.60*  --  2.30*  CALCIUM 7.5* 7.1*  --   --   --  8.5*   < > = values in this interval not displayed.   LFT Recent Labs    10/10/21 0334  PROT 3.9*  ALBUMIN 2.3*   AST 158*  ALT 110*  ALKPHOS 54  BILITOT 10.1*   PT/INR Recent Labs    10/09/21 1340 10/10/21 0334  LABPROT 20.7* 28.9*  INR 1.8* 2.8*      Patient profile:   71 year old male with history of [place]ita [place] cirrhosis with PVT and HCC, paracentesis every 2 weeks, coronary disease, sleep apnea on CPAP, hypothyroidism, thrombocytopenia, CKD due to hepatorenal syndrome, PAD presented with melena and anemia, confusion. Admitted 09/2017 decompensated cirrhosis, anemia, and HRS, Klebsiella pneumonia bacteremia and hemorrhagic shock. EGD 06/07 from [facility] treated with Hemospray.  Nonbleeding grade 1 esophageal varices. EGD 06/21 with Dr. Leonides Schanz no focal source of bleeding secondary PHG status post hemostatic spray. 06/23 status post TIPS with paracentesis performed 3 L removed.   Impression/Plan:   Decompensated cirrhosis with thrombocytopenia, portal vein thrombosis and HCC HGB 7.6 Platelets 43 AST 158 (399) ALT 110 (273)  Alkphos 54 TBili 10.1 (9.9) GFR 30  MELD-Na: 35 at 10/10/2021   Coagulopathy INR 10/10/2021 2.8  (1.8) DIC panel shows Ddimer elevated, Fibrinogen low, no shistocytes  Refractory ascites status post TIPS 06/23   Hepatic encephalopathy Getting lactulose enemas continues to be lethargic astrexis We will add on Xifaxan, transition to p.o. lactulose when able to tolerate safely.  Symptomatic anemia with hemorrhagic shock resolved  Secondary to PHG status post Hemospray with EGD 06/21  Continue to monitor CBCs daily Please notify GI for any further bleeding.  Anasarca status post TIPS with grade 3 diastolic dysfunction, mild to moderate MR, mild to moderate AS Protein 3.9, albumin 2.3 10/06/2021 echo EF normal grade 3 diastolic dysfunction with moderate MR and AS. CXR 06/23 bilateral pulmonary infiltrates/probable pleural effusions  Per critical care team for diuretics.  AKI  BUN 95 Cr 2.30  GFR 30  Potassium 3.6  Magnesium 2.1  Improving mildly ? Cardiorenal?  OSA  on CPAP 10/06/2021 echo RSVP of 40  Very guarded prognosis    LOS: 5 days   Doree Albee  10/10/2021, 11:48 AM

## 2021-10-11 DIAGNOSIS — K7460 Unspecified cirrhosis of liver: Secondary | ICD-10-CM | POA: Diagnosis not present

## 2021-10-11 DIAGNOSIS — K922 Gastrointestinal hemorrhage, unspecified: Secondary | ICD-10-CM | POA: Diagnosis not present

## 2021-10-11 DIAGNOSIS — K7682 Hepatic encephalopathy: Secondary | ICD-10-CM | POA: Diagnosis not present

## 2021-10-11 DIAGNOSIS — N179 Acute kidney failure, unspecified: Secondary | ICD-10-CM | POA: Diagnosis not present

## 2021-10-11 DIAGNOSIS — C220 Liver cell carcinoma: Secondary | ICD-10-CM | POA: Diagnosis not present

## 2021-10-11 LAB — CBC
HCT: 21 % — ABNORMAL LOW (ref 39.0–52.0)
HCT: 21.8 % — ABNORMAL LOW (ref 39.0–52.0)
Hemoglobin: 7.3 g/dL — ABNORMAL LOW (ref 13.0–17.0)
Hemoglobin: 7.5 g/dL — ABNORMAL LOW (ref 13.0–17.0)
MCH: 31.9 pg (ref 26.0–34.0)
MCH: 32.5 pg (ref 26.0–34.0)
MCHC: 34.8 g/dL (ref 30.0–36.0)
MCHC: 34.4 g/dL (ref 30.0–36.0)
MCV: 91.7 fL (ref 80.0–100.0)
MCV: 94.4 fL (ref 80.0–100.0)
Platelets: 48 10*3/uL — ABNORMAL LOW (ref 150–400)
Platelets: 49 10*3/uL — ABNORMAL LOW (ref 150–400)
RBC: 2.29 MIL/uL — ABNORMAL LOW (ref 4.22–5.81)
RBC: 2.31 MIL/uL — ABNORMAL LOW (ref 4.22–5.81)
RDW: 18.9 % — ABNORMAL HIGH (ref 11.5–15.5)
RDW: 19.8 % — ABNORMAL HIGH (ref 11.5–15.5)
WBC: 10.1 10*3/uL (ref 4.0–10.5)
WBC: 12.9 10*3/uL — ABNORMAL HIGH (ref 4.0–10.5)
nRBC: 0.2 % (ref 0.0–0.2)
nRBC: 0.2 % (ref 0.0–0.2)

## 2021-10-11 LAB — DIC (DISSEMINATED INTRAVASCULAR COAGULATION)PANEL
D-Dimer, Quant: >20 ug/mL-FEU — ABNORMAL HIGH (ref 0.00–0.50)
D-Dimer, Quant: >20 ug/mL-FEU — ABNORMAL HIGH (ref 0.00–0.50)
Fibrinogen: 95 mg/dL — CL (ref 210–475)
Fibrinogen: 161 mg/dL — ABNORMAL LOW (ref 210–475)
INR: 3.4 — ABNORMAL HIGH (ref 0.8–1.2)
INR: 3 — ABNORMAL HIGH (ref 0.8–1.2)
Platelets: 50 10*3/uL — ABNORMAL LOW (ref 150–400)
Platelets: 48 10*3/uL — ABNORMAL LOW (ref 150–400)
Prothrombin Time: 34.3 seconds — ABNORMAL HIGH (ref 11.4–15.2)
Prothrombin Time: 30.7 seconds — ABNORMAL HIGH (ref 11.4–15.2)
Smear Review: NONE SEEN
Smear Review: NONE SEEN
aPTT: 54 seconds — ABNORMAL HIGH (ref 24–36)
aPTT: 48 seconds — ABNORMAL HIGH (ref 24–36)

## 2021-10-11 LAB — PREPARE PLATELET PHERESIS: Unit division: 0

## 2021-10-11 LAB — COMPREHENSIVE METABOLIC PANEL
ALT: 65 U/L — ABNORMAL HIGH (ref 0–44)
AST: 110 U/L — ABNORMAL HIGH (ref 15–41)
Albumin: 2 g/dL — ABNORMAL LOW (ref 3.5–5.0)
Alkaline Phosphatase: 62 U/L (ref 38–126)
Anion gap: 8 (ref 5–15)
BUN: 95 mg/dL — ABNORMAL HIGH (ref 8–23)
CO2: 23 mmol/L (ref 22–32)
Calcium: 8.2 mg/dL — ABNORMAL LOW (ref 8.9–10.3)
Chloride: 112 mmol/L — ABNORMAL HIGH (ref 98–111)
Creatinine, Ser: 1.94 mg/dL — ABNORMAL HIGH (ref 0.61–1.24)
GFR, Estimated: 36 mL/min — ABNORMAL LOW (ref >60)
Glucose, Bld: 142 mg/dL — ABNORMAL HIGH (ref 70–99)
Potassium: 3.5 mmol/L (ref 3.5–5.1)
Sodium: 143 mmol/L (ref 135–145)
Total Bilirubin: 11.2 mg/dL — ABNORMAL HIGH (ref 0.3–1.2)
Total Protein: 3.5 g/dL — ABNORMAL LOW (ref 6.5–8.1)

## 2021-10-11 LAB — GLUCOSE, CAPILLARY
Glucose-Capillary: 135 mg/dL — ABNORMAL HIGH (ref 70–99)
Glucose-Capillary: 127 mg/dL — ABNORMAL HIGH (ref 70–99)
Glucose-Capillary: 125 mg/dL — ABNORMAL HIGH (ref 70–99)
Glucose-Capillary: 106 mg/dL — ABNORMAL HIGH (ref 70–99)
Glucose-Capillary: 114 mg/dL — ABNORMAL HIGH (ref 70–99)
Glucose-Capillary: 109 mg/dL — ABNORMAL HIGH (ref 70–99)

## 2021-10-11 LAB — BPAM PLATELET PHERESIS
Blood Product Expiration Date: 202306252359
ISSUE DATE / TIME: 202306241246
Unit Type and Rh: 6200

## 2021-10-11 MED ORDER — SODIUM CHLORIDE 0.9% IV SOLUTION: Freq: Once | INTRAVENOUS | Status: AC

## 2021-10-11 MED ORDER — ORAL CARE MOUTH RINSE
15.0000 mL | OROMUCOSAL | Status: DC
  Administered 2021-10-11: 15 mL via OROMUCOSAL

## 2021-10-11 MED ORDER — POTASSIUM CHLORIDE 10 MEQ/50ML IV SOLN
10.0000 meq | INTRAVENOUS | Status: AC
  Administered 2021-10-11 (×4): 10 meq via INTRAVENOUS
  Filled 2021-10-11 (×4): qty 50

## 2021-10-11 MED ORDER — ORAL CARE MOUTH RINSE: 15.0000 mL | OROMUCOSAL | Status: DC | PRN

## 2021-10-11 MED ORDER — FUROSEMIDE 10 MG/ML IJ SOLN
40.0000 mg | Freq: Two times a day (BID) | INTRAMUSCULAR | Status: DC
  Administered 2021-10-11 – 2021-10-14 (×7): 40 mg via INTRAVENOUS
  Filled 2021-10-11 (×7): qty 4

## 2021-10-11 MED ORDER — DEXTROSE 5 % IV SOLN
2.5000 mg | Freq: Once | INTRAVENOUS | Status: AC
  Administered 2021-10-11: 2.5 mg via INTRAVENOUS
  Filled 2021-10-11: qty 0.25

## 2021-10-11 NOTE — Progress Notes (Signed)
Referring Physician(s): Dr. Everardo All  Supervising Physician: Irish Lack  Patient Status:  [facility] - In-pt  Chief Complaint: NASH cirrhosis, portal vein stenosis, splenorenal shunt, hepatorenal syndrome  Subjective: S/p coil embolization of splenorenal shunt, portal vein angioplasty, and TIPS 6/23 by Dr. Elby Showers.  Resting comfortably.  Off pressors.  Groggy, but participates in conversation.  Follows commands.    Allergies: Patient has no known allergies.  Medications: Prior to Admission medications   Medication Sig Start Date End Date Taking? Authorizing Provider  atorvastatin (LIPITOR) 20 MG tablet Take 20 mg by mouth at bedtime.  08/25/17  Yes [provider]  isosorbide mononitrate (IMDUR) 30 MG 24 hr tablet Take 30 mg by mouth daily. 09/22/21  Yes [provider]  lactulose (CHRONULAC) 10 GM/15ML solution Take 30 mLs (20 g total) by mouth 3 (three) times daily. Goal is 2 bowel movements daily. 07/20/21  Yes Pieter Partridge, MD  levothyroxine (SYNTHROID) 75 MCG tablet Take 0.5 tablets (37.5 mcg total) by mouth daily before breakfast. 09/22/21  Yes Rehman, Joline Maxcy, MD  metoprolol tartrate (LOPRESSOR) 25 MG tablet Take 0.5 tablets (12.5 mg total) by mouth 2 (two) times daily. 08/10/21  Yes Chandrasekhar, [name], MD  Omega-3 Fatty Acids (OMEGA 3 500 PO) Take 500 mg by mouth at bedtime.   Yes [provider]  pantoprazole (PROTONIX) 40 MG tablet Take 1 tablet (40 mg total) by mouth 2 (two) times daily before a meal. 09/24/21  Yes Memon, Durward Mallard, MD  rifaximin (XIFAXAN) 550 MG TABS tablet Take 1 tablet (550 mg total) by mouth 2 (two) times daily. Patient taking differently: Take 550 mg by mouth 2 (two) times daily. Continuously 08/12/20  Yes Rehman, Joline Maxcy, MD  zinc gluconate 50 MG tablet Take 50 mg by mouth daily.   Yes [provider]     Vital Signs: BP 129/75   Pulse 93   Temp 98.1 F (36.7 C)   Resp (!) 21   Ht 5\' 8"  (1.727 m)    Wt 233 lb 7.5 oz (105.9 kg)   SpO2 100%   BMI 35.50 kg/m   Physical Exam NAD, awakens easily, participates in conversation.  Anasarca.  Skin: jaundice, bruising noted, IJ and femoral procedure sites intact, clean, with small amount of weeping from edema.  Right lateral abdominal paracentesis site stable.   Imaging: IR Tips  Result Date: 10/09/2021 CLINICAL DATA:  71 year old male with NASH cirrhosis (Child Pugh C) and severe portal hypertension exacerbated by main portal vein stenosis, likely from chronic non-occlusive thrombus, in addition to hepatofugal portal flow due to massive splenorenal shunt. Plan for TIPS approach to angioplasty portal vein with possible stent placement, followed by embolization of splenorenal shunt in hopes to establish antegrade portal flow to improve what liver function he may have left. EXAM: 1. Ultrasound-guided paracentesis 2. Ultrasound-guided access of the right internal jugular vein 3. Ultrasound-guided access of the right common femoral vein 4. Hepatic venogram 5. Intravascular ultrasound 6. Catheterization of the portal vein 7. Portal venous and central manometry 8. Portal venogram 9. Coil embolization of splenorenal shunt 10. Angioplasty of the main portal vein 11. Creation of a transhepatic portal vein to hepatic vein shunt MEDICATIONS: As antibiotic prophylaxis, Rocephin 1 gm IV was ordered pre-procedure and administered intravenously within one hour of incision. ANESTHESIA/SEDATION: General - as administered by the Anesthesia department CONTRAST:  One hundred fifty ML Omnipaque 300, intravenous FLUOROSCOPY TIME:  Fluoroscopy Time: 54.3 minutes (1,312 mGy). COMPLICATIONS: None  immediate. PROCEDURE: Informed written consent was obtained from the patient after a thorough discussion of the procedural risks, benefits and alternatives. All questions were addressed. Maximal Sterile Barrier Technique was utilized including caps, mask, sterile gowns, sterile gloves,  sterile drape, hand hygiene and skin antiseptic. A timeout was performed prior to the initiation of the procedure. The right lower quadrant was prepped and draped in standard fashion. Preprocedure ultrasound evaluation demonstrated large volume ascites. A small skin nick was made. Under direct ultrasound visualization, a 10 cm, 19 gauge Yueh needle was directed into the ascitic fluid in the needle was removed. Throughout the procedure, a total of 3 L of translucent, amber fluid were removed. A preliminary ultrasound of the right groin was performed and demonstrates a patent right common femoral vein. A permanent ultrasound image was recorded. Using a combination of fluoroscopy and ultrasound, an access site was determined. A small dermatotomy was made at the planned puncture site. Using ultrasound guidance, access into the right common femoral vein was obtained with visualization of needle entry into the vessel using a standard micropuncture technique. A wire was advanced into the IVC insert all fascial dilation performed. An 8 [place], 11 cm vascular sheath was placed into the external iliac vein. Through this access site, an 64 [place] ICE catheter was advanced with ease under fluoroscopic guidance to the level of the intrahepatic inferior vena cava. A preliminary ultrasound of the right neck was performed and demonstrates a patent internal jugular vein. A permanent ultrasound image was recorded. Using a combination of fluoroscopy and ultrasound, an access site was determined. A small dermatotomy was made at the planned puncture site. Using ultrasound guidance, access into the right internal jugular vein was obtained with visualization of needle entry into the vessel using a standard micropuncture technique. A wire was advanced into the IVC and serial fascial dilation performed. A 10 French Gore dry seal sheath was placed into the internal jugular vein and advanced to the IVC. The jugular sheath was retracted  into the right atrium and manometry was performed. Right atrial mean pressure was 16 mm Hg. A 5 French angled tip catheter was then directed into the right hepatic vein. Hepatic venogram was performed. These images demonstrated patent hepatic vein with no stenosis. The catheter was advanced to a wedge portion of the a patent vein over which the 10 French sheath was advanced into the right hepatic vein. Using ICE ultrasound visualization the catheter as right hepatic vein as well as the portal anatomy was defined. A planned exit site from the hepatic vein and puncture site from the portal vein was placed into a single sonographic plane. Under direct ultrasound visualization, the ScorpionX needle was advanced into the central right portal vein. Hand injection of contrast confirmed position within the portal system. A Glidewire Advantage was then advanced into the splenic vein. A 5 French marking pigtail catheter was then advanced over the wire into the main portal vein and wire removed. Portal venogram was performed which demonstrated complete hepatofugal flow in the portal vein, primarily through the massive splenorenal shunt and into the inferior vena cava. Portal manometry was performed which demonstrated a mean portal pressure of 21 mm Hg. Next, a V 18 wire was inserted through the pigtail catheter which was removed. Adjacent to the safety wire, an angled tip Navicross catheter and a Glidewire Advantage were directed to the inferior vena cava and used to select the left renal vein. The catheter was exchanged for a C2 in order  to select the splenorenal shunt. Venogram demonstrated brisk flow into the left renal vein which was severely dilated. Coil embolization was then performed with an assortment of 0.035 inch Azur coils. Pigtail catheter was then reinserted into the main portal/central splenic vein. Venogram was again performed which demonstrated persistent patency of the splenorenal shunt and no evidence of  increased antegrade flow. Portal manometry was again performed demonstrating a mean portal pressure increased to 30 mm Hg. Therefore, intravascular ultrasound was performed to interrogate the portal vein for possible clot or stenosis. There is a focal wall adherent echogenic chronic thrombus in the mid main portal vein with significantly smaller luminal diameter than just proximal distal to the stenosis. Therefore, balloon angioplasty was performed with a 14 mm by 4 cm atlas balloon. Repeat intravascular ultrasound demonstrated improved luminal diameter with disruption of the chronic thrombus. No evidence of dissection. Repeat venogram demonstrated persistent hepatofugal flow through the portal vein and into the splenorenal shunt. Therefore, from the splenic vein approach, the tortuous portion of the splenorenal shunt near the indwelling coil pack was accessed with a Navicross catheter. An additional assortment of 0.035 inch Azur coils were placed. Repeat venogram demonstrated persistent patency. Therefore, a Progreat microcatheter was inserted and 60 cm followed by a 45 cm Penumbra packing coils were deployed. At this point, retrograde flow to splenorenal shunt was slowed, however stool present. Several gastric veins within beginning to fill that were not previously visualized. Given these findings, in an attempt to improve antegrade flow, TIPS endograft deployment was pursued. Under direct fluoroscopic visualization, an 8-10 mm, 7+ 2 cm Viatorr endograft was deployed. Post deployment venogram demonstrated occlusion of the indwelling endograft, likely secondary to stenosis in the intraparenchymal tract. Therefore, balloon angioplasty of the stenotic area was performed with a 6 mm x 2 cm mustang balloon. Repeat portal venogram demonstrated patency of the endograft with brisk antegrade flow 1 injected from the main portal vein. Additional splenic venogram was performed after TIPS balloon molding which demonstrated  persistent retrograde flow through the splenorenal shunt in indwelling coil packs. Therefore, retrograde access to the splenorenal shunt was again pursued from the left renal vein. There was an attempt to place a 7 French sheath into the splenorenal shunt in hopes of deployment of a 14 [place] Amplatzer plug. However due to tortuosity, after multiple attempts to gain stable access, cannulation was not successful. The catheters and sheath were removed and manual compression was applied to the right internal jugular and right common femoral venous access sites until hemostasis was achieved. The patient was transferred to the PACU in stable condition. IMPRESSION: 1. Complete hepatopetal portal venous flow, the majority exiting via massive splenorenal shunt. 2. Coil embolization of the splenorenal shunt. There is still some retrograde flow through the indwelling coil packs in the splenorenal shunt upon completion, hopeful for these to continue to thrombose over time and reverse to hepatopetal flow. 3. Balloon angioplasty of the main portal vein. 4. Small TIPS placement with post deployment balloon molding to 6 mm. 5. Ultrasound-guided paracentesis yielding 3 L ascitic fluid. Marliss Coots, MD [facility] Radiology Electronically Signed   By: Marliss Coots M.D.   On: 10/09/2021 17:02   IR US Guide Vasc Access Right  Result Date: 10/09/2021 CLINICAL DATA:  71 year old male with NASH cirrhosis (Child Pugh C) and severe portal hypertension exacerbated by main portal vein stenosis, likely from chronic non-occlusive thrombus, in addition to hepatofugal portal flow due to massive splenorenal shunt.  Plan for TIPS approach to angioplasty portal vein with possible stent placement, followed by embolization of splenorenal shunt in hopes to establish antegrade portal flow to improve what liver function he may have left. EXAM: 1. Ultrasound-guided paracentesis 2. Ultrasound-guided  access of the right internal jugular vein 3. Ultrasound-guided access of the right common femoral vein 4. Hepatic venogram 5. Intravascular ultrasound 6. Catheterization of the portal vein 7. Portal venous and central manometry 8. Portal venogram 9. Coil embolization of splenorenal shunt 10. Angioplasty of the main portal vein 11. Creation of a transhepatic portal vein to hepatic vein shunt MEDICATIONS: As antibiotic prophylaxis, Rocephin 1 gm IV was ordered pre-procedure and administered intravenously within one hour of incision. ANESTHESIA/SEDATION: General - as administered by the Anesthesia department CONTRAST:  One hundred fifty ML Omnipaque 300, intravenous FLUOROSCOPY TIME:  Fluoroscopy Time: 54.3 minutes (1,312 mGy). COMPLICATIONS: None immediate. PROCEDURE: Informed written consent was obtained from the patient after a thorough discussion of the procedural risks, benefits and alternatives. All questions were addressed. Maximal Sterile Barrier Technique was utilized including caps, mask, sterile gowns, sterile gloves, sterile drape, hand hygiene and skin antiseptic. A timeout was performed prior to the initiation of the procedure. The right lower quadrant was prepped and draped in standard fashion. Preprocedure ultrasound evaluation demonstrated large volume ascites. A small skin nick was made. Under direct ultrasound visualization, a 10 cm, 19 gauge Yueh needle was directed into the ascitic fluid in the needle was removed. Throughout the procedure, a total of 3 L of translucent, amber fluid were removed. A preliminary ultrasound of the right groin was performed and demonstrates a patent right common femoral vein. A permanent ultrasound image was recorded. Using a combination of fluoroscopy and ultrasound, an access site was determined. A small dermatotomy was made at the planned puncture site. Using ultrasound guidance, access into the right common femoral vein was obtained with visualization of needle  entry into the vessel using a standard micropuncture technique. A wire was advanced into the IVC insert all fascial dilation performed. An 8 [place], 11 cm vascular sheath was placed into the external iliac vein. Through this access site, an 68 [place] ICE catheter was advanced with ease under fluoroscopic guidance to the level of the intrahepatic inferior vena cava. A preliminary ultrasound of the right neck was performed and demonstrates a patent internal jugular vein. A permanent ultrasound image was recorded. Using a combination of fluoroscopy and ultrasound, an access site was determined. A small dermatotomy was made at the planned puncture site. Using ultrasound guidance, access into the right internal jugular vein was obtained with visualization of needle entry into the vessel using a standard micropuncture technique. A wire was advanced into the IVC and serial fascial dilation performed. A 10 French Gore dry seal sheath was placed into the internal jugular vein and advanced to the IVC. The jugular sheath was retracted into the right atrium and manometry was performed. Right atrial mean pressure was 16 mm Hg. A 5 French angled tip catheter was then directed into the right hepatic vein. Hepatic venogram was performed. These images demonstrated patent hepatic vein with no stenosis. The catheter was advanced to a wedge portion of the a patent vein over which the 10 French sheath was advanced into the right hepatic vein. Using ICE ultrasound visualization the catheter as right hepatic vein as well as the portal anatomy was defined. A planned exit site from the hepatic vein and puncture site from the portal vein was placed into a  single sonographic plane. Under direct ultrasound visualization, the ScorpionX needle was advanced into the central right portal vein. Hand injection of contrast confirmed position within the portal system. A Glidewire Advantage was then advanced into the splenic vein. A 5 French  marking pigtail catheter was then advanced over the wire into the main portal vein and wire removed. Portal venogram was performed which demonstrated complete hepatofugal flow in the portal vein, primarily through the massive splenorenal shunt and into the inferior vena cava. Portal manometry was performed which demonstrated a mean portal pressure of 21 mm Hg. Next, a V 18 wire was inserted through the pigtail catheter which was removed. Adjacent to the safety wire, an angled tip Navicross catheter and a Glidewire Advantage were directed to the inferior vena cava and used to select the left renal vein. The catheter was exchanged for a C2 in order to select the splenorenal shunt. Venogram demonstrated brisk flow into the left renal vein which was severely dilated. Coil embolization was then performed with an assortment of 0.035 inch Azur coils. Pigtail catheter was then reinserted into the main portal/central splenic vein. Venogram was again performed which demonstrated persistent patency of the splenorenal shunt and no evidence of increased antegrade flow. Portal manometry was again performed demonstrating a mean portal pressure increased to 30 mm Hg. Therefore, intravascular ultrasound was performed to interrogate the portal vein for possible clot or stenosis. There is a focal wall adherent echogenic chronic thrombus in the mid main portal vein with significantly smaller luminal diameter than just proximal distal to the stenosis. Therefore, balloon angioplasty was performed with a 14 mm by 4 cm atlas balloon. Repeat intravascular ultrasound demonstrated improved luminal diameter with disruption of the chronic thrombus. No evidence of dissection. Repeat venogram demonstrated persistent hepatofugal flow through the portal vein and into the splenorenal shunt. Therefore, from the splenic vein approach, the tortuous portion of the splenorenal shunt near the indwelling coil pack was accessed with a Navicross catheter. An  additional assortment of 0.035 inch Azur coils were placed. Repeat venogram demonstrated persistent patency. Therefore, a Progreat microcatheter was inserted and 60 cm followed by a 45 cm Penumbra packing coils were deployed. At this point, retrograde flow to splenorenal shunt was slowed, however stool present. Several gastric veins within beginning to fill that were not previously visualized. Given these findings, in an attempt to improve antegrade flow, TIPS endograft deployment was pursued. Under direct fluoroscopic visualization, an 8-10 mm, 7+ 2 cm Viatorr endograft was deployed. Post deployment venogram demonstrated occlusion of the indwelling endograft, likely secondary to stenosis in the intraparenchymal tract. Therefore, balloon angioplasty of the stenotic area was performed with a 6 mm x 2 cm mustang balloon. Repeat portal venogram demonstrated patency of the endograft with brisk antegrade flow 1 injected from the main portal vein. Additional splenic venogram was performed after TIPS balloon molding which demonstrated persistent retrograde flow through the splenorenal shunt in indwelling coil packs. Therefore, retrograde access to the splenorenal shunt was again pursued from the left renal vein. There was an attempt to place a 7 French sheath into the splenorenal shunt in hopes of deployment of a 14 [place] Amplatzer plug. However due to tortuosity, after multiple attempts to gain stable access, cannulation was not successful. The catheters and sheath were removed and manual compression was applied to the right internal jugular and right common femoral venous access sites until hemostasis was achieved. The patient was transferred to the PACU in stable condition. IMPRESSION: 1. Complete hepatopetal portal venous  flow, the majority exiting via massive splenorenal shunt. 2. Coil embolization of the splenorenal shunt. There is still some retrograde flow through the indwelling coil packs in the splenorenal shunt  upon completion, hopeful for these to continue to thrombose over time and reverse to hepatopetal flow. 3. Balloon angioplasty of the main portal vein. 4. Small TIPS placement with post deployment balloon molding to 6 mm. 5. Ultrasound-guided paracentesis yielding 3 L ascitic fluid. Marliss Coots, MD [facility] Radiology Electronically Signed   By: Marliss Coots M.D.   On: 10/09/2021 17:02   IR Angiogram Selective Each Additional Vessel  Result Date: 10/09/2021 CLINICAL DATA:  71 year old male with NASH cirrhosis (Child Pugh C) and severe portal hypertension exacerbated by main portal vein stenosis, likely from chronic non-occlusive thrombus, in addition to hepatofugal portal flow due to massive splenorenal shunt. Plan for TIPS approach to angioplasty portal vein with possible stent placement, followed by embolization of splenorenal shunt in hopes to establish antegrade portal flow to improve what liver function he may have left. EXAM: 1. Ultrasound-guided paracentesis 2. Ultrasound-guided access of the right internal jugular vein 3. Ultrasound-guided access of the right common femoral vein 4. Hepatic venogram 5. Intravascular ultrasound 6. Catheterization of the portal vein 7. Portal venous and central manometry 8. Portal venogram 9. Coil embolization of splenorenal shunt 10. Angioplasty of the main portal vein 11. Creation of a transhepatic portal vein to hepatic vein shunt MEDICATIONS: As antibiotic prophylaxis, Rocephin 1 gm IV was ordered pre-procedure and administered intravenously within one hour of incision. ANESTHESIA/SEDATION: General - as administered by the Anesthesia department CONTRAST:  One hundred fifty ML Omnipaque 300, intravenous FLUOROSCOPY TIME:  Fluoroscopy Time: 54.3 minutes (1,312 mGy). COMPLICATIONS: None immediate. PROCEDURE: Informed written consent was obtained from the patient after a thorough discussion of the procedural risks,  benefits and alternatives. All questions were addressed. Maximal Sterile Barrier Technique was utilized including caps, mask, sterile gowns, sterile gloves, sterile drape, hand hygiene and skin antiseptic. A timeout was performed prior to the initiation of the procedure. The right lower quadrant was prepped and draped in standard fashion. Preprocedure ultrasound evaluation demonstrated large volume ascites. A small skin nick was made. Under direct ultrasound visualization, a 10 cm, 19 gauge Yueh needle was directed into the ascitic fluid in the needle was removed. Throughout the procedure, a total of 3 L of translucent, amber fluid were removed. A preliminary ultrasound of the right groin was performed and demonstrates a patent right common femoral vein. A permanent ultrasound image was recorded. Using a combination of fluoroscopy and ultrasound, an access site was determined. A small dermatotomy was made at the planned puncture site. Using ultrasound guidance, access into the right common femoral vein was obtained with visualization of needle entry into the vessel using a standard micropuncture technique. A wire was advanced into the IVC insert all fascial dilation performed. An 8 [place], 11 cm vascular sheath was placed into the external iliac vein. Through this access site, an 37 [place] ICE catheter was advanced with ease under fluoroscopic guidance to the level of the intrahepatic inferior vena cava. A preliminary ultrasound of the right neck was performed and demonstrates a patent internal jugular vein. A permanent ultrasound image was recorded. Using a combination of fluoroscopy and ultrasound, an access site was determined. A small dermatotomy was made at the planned puncture site. Using ultrasound guidance, access into the right internal jugular vein was obtained with visualization of needle entry into  the vessel using a standard micropuncture technique. A wire was advanced into the IVC and serial  fascial dilation performed. A 10 French Gore dry seal sheath was placed into the internal jugular vein and advanced to the IVC. The jugular sheath was retracted into the right atrium and manometry was performed. Right atrial mean pressure was 16 mm Hg. A 5 French angled tip catheter was then directed into the right hepatic vein. Hepatic venogram was performed. These images demonstrated patent hepatic vein with no stenosis. The catheter was advanced to a wedge portion of the a patent vein over which the 10 French sheath was advanced into the right hepatic vein. Using ICE ultrasound visualization the catheter as right hepatic vein as well as the portal anatomy was defined. A planned exit site from the hepatic vein and puncture site from the portal vein was placed into a single sonographic plane. Under direct ultrasound visualization, the ScorpionX needle was advanced into the central right portal vein. Hand injection of contrast confirmed position within the portal system. A Glidewire Advantage was then advanced into the splenic vein. A 5 French marking pigtail catheter was then advanced over the wire into the main portal vein and wire removed. Portal venogram was performed which demonstrated complete hepatofugal flow in the portal vein, primarily through the massive splenorenal shunt and into the inferior vena cava. Portal manometry was performed which demonstrated a mean portal pressure of 21 mm Hg. Next, a V 18 wire was inserted through the pigtail catheter which was removed. Adjacent to the safety wire, an angled tip Navicross catheter and a Glidewire Advantage were directed to the inferior vena cava and used to select the left renal vein. The catheter was exchanged for a C2 in order to select the splenorenal shunt. Venogram demonstrated brisk flow into the left renal vein which was severely dilated. Coil embolization was then performed with an assortment of 0.035 inch Azur coils. Pigtail catheter was then  reinserted into the main portal/central splenic vein. Venogram was again performed which demonstrated persistent patency of the splenorenal shunt and no evidence of increased antegrade flow. Portal manometry was again performed demonstrating a mean portal pressure increased to 30 mm Hg. Therefore, intravascular ultrasound was performed to interrogate the portal vein for possible clot or stenosis. There is a focal wall adherent echogenic chronic thrombus in the mid main portal vein with significantly smaller luminal diameter than just proximal distal to the stenosis. Therefore, balloon angioplasty was performed with a 14 mm by 4 cm atlas balloon. Repeat intravascular ultrasound demonstrated improved luminal diameter with disruption of the chronic thrombus. No evidence of dissection. Repeat venogram demonstrated persistent hepatofugal flow through the portal vein and into the splenorenal shunt. Therefore, from the splenic vein approach, the tortuous portion of the splenorenal shunt near the indwelling coil pack was accessed with a Navicross catheter. An additional assortment of 0.035 inch Azur coils were placed. Repeat venogram demonstrated persistent patency. Therefore, a Progreat microcatheter was inserted and 60 cm followed by a 45 cm Penumbra packing coils were deployed. At this point, retrograde flow to splenorenal shunt was slowed, however stool present. Several gastric veins within beginning to fill that were not previously visualized. Given these findings, in an attempt to improve antegrade flow, TIPS endograft deployment was pursued. Under direct fluoroscopic visualization, an 8-10 mm, 7+ 2 cm Viatorr endograft was deployed. Post deployment venogram demonstrated occlusion of the indwelling endograft, likely secondary to stenosis in the intraparenchymal tract. Therefore, balloon angioplasty of the stenotic area  was performed with a 6 mm x 2 cm mustang balloon. Repeat portal venogram demonstrated patency of the  endograft with brisk antegrade flow 1 injected from the main portal vein. Additional splenic venogram was performed after TIPS balloon molding which demonstrated persistent retrograde flow through the splenorenal shunt in indwelling coil packs. Therefore, retrograde access to the splenorenal shunt was again pursued from the left renal vein. There was an attempt to place a 7 French sheath into the splenorenal shunt in hopes of deployment of a 14 [place] Amplatzer plug. However due to tortuosity, after multiple attempts to gain stable access, cannulation was not successful. The catheters and sheath were removed and manual compression was applied to the right internal jugular and right common femoral venous access sites until hemostasis was achieved. The patient was transferred to the PACU in stable condition. IMPRESSION: 1. Complete hepatopetal portal venous flow, the majority exiting via massive splenorenal shunt. 2. Coil embolization of the splenorenal shunt. There is still some retrograde flow through the indwelling coil packs in the splenorenal shunt upon completion, hopeful for these to continue to thrombose over time and reverse to hepatopetal flow. 3. Balloon angioplasty of the main portal vein. 4. Small TIPS placement with post deployment balloon molding to 6 mm. 5. Ultrasound-guided paracentesis yielding 3 L ascitic fluid. Marliss Coots, MD [facility] Radiology Electronically Signed   By: Marliss Coots M.D.   On: 10/09/2021 17:02   IR Angiogram Selective Each Additional Vessel  Result Date: 10/09/2021 CLINICAL DATA:  71 year old male with NASH cirrhosis (Child Pugh C) and severe portal hypertension exacerbated by main portal vein stenosis, likely from chronic non-occlusive thrombus, in addition to hepatofugal portal flow due to massive splenorenal shunt. Plan for TIPS approach to angioplasty portal vein with possible stent placement, followed by embolization  of splenorenal shunt in hopes to establish antegrade portal flow to improve what liver function he may have left. EXAM: 1. Ultrasound-guided paracentesis 2. Ultrasound-guided access of the right internal jugular vein 3. Ultrasound-guided access of the right common femoral vein 4. Hepatic venogram 5. Intravascular ultrasound 6. Catheterization of the portal vein 7. Portal venous and central manometry 8. Portal venogram 9. Coil embolization of splenorenal shunt 10. Angioplasty of the main portal vein 11. Creation of a transhepatic portal vein to hepatic vein shunt MEDICATIONS: As antibiotic prophylaxis, Rocephin 1 gm IV was ordered pre-procedure and administered intravenously within one hour of incision. ANESTHESIA/SEDATION: General - as administered by the Anesthesia department CONTRAST:  One hundred fifty ML Omnipaque 300, intravenous FLUOROSCOPY TIME:  Fluoroscopy Time: 54.3 minutes (1,312 mGy). COMPLICATIONS: None immediate. PROCEDURE: Informed written consent was obtained from the patient after a thorough discussion of the procedural risks, benefits and alternatives. All questions were addressed. Maximal Sterile Barrier Technique was utilized including caps, mask, sterile gowns, sterile gloves, sterile drape, hand hygiene and skin antiseptic. A timeout was performed prior to the initiation of the procedure. The right lower quadrant was prepped and draped in standard fashion. Preprocedure ultrasound evaluation demonstrated large volume ascites. A small skin nick was made. Under direct ultrasound visualization, a 10 cm, 19 gauge Yueh needle was directed into the ascitic fluid in the needle was removed. Throughout the procedure, a total of 3 L of translucent, amber fluid were removed. A preliminary ultrasound of the right groin was performed and demonstrates a patent right common femoral vein. A permanent ultrasound image was recorded. Using a combination of fluoroscopy and ultrasound, an access site was determined.  A small dermatotomy was made at the planned puncture site. Using ultrasound guidance, access into the right common femoral vein was obtained with visualization of needle entry into the vessel using a standard micropuncture technique. A wire was advanced into the IVC insert all fascial dilation performed. An 8 [place], 11 cm vascular sheath was placed into the external iliac vein. Through this access site, an 20 [place] ICE catheter was advanced with ease under fluoroscopic guidance to the level of the intrahepatic inferior vena cava. A preliminary ultrasound of the right neck was performed and demonstrates a patent internal jugular vein. A permanent ultrasound image was recorded. Using a combination of fluoroscopy and ultrasound, an access site was determined. A small dermatotomy was made at the planned puncture site. Using ultrasound guidance, access into the right internal jugular vein was obtained with visualization of needle entry into the vessel using a standard micropuncture technique. A wire was advanced into the IVC and serial fascial dilation performed. A 10 French Gore dry seal sheath was placed into the internal jugular vein and advanced to the IVC. The jugular sheath was retracted into the right atrium and manometry was performed. Right atrial mean pressure was 16 mm Hg. A 5 French angled tip catheter was then directed into the right hepatic vein. Hepatic venogram was performed. These images demonstrated patent hepatic vein with no stenosis. The catheter was advanced to a wedge portion of the a patent vein over which the 10 French sheath was advanced into the right hepatic vein. Using ICE ultrasound visualization the catheter as right hepatic vein as well as the portal anatomy was defined. A planned exit site from the hepatic vein and puncture site from the portal vein was placed into a single sonographic plane. Under direct ultrasound visualization, the ScorpionX needle was advanced into the  central right portal vein. Hand injection of contrast confirmed position within the portal system. A Glidewire Advantage was then advanced into the splenic vein. A 5 French marking pigtail catheter was then advanced over the wire into the main portal vein and wire removed. Portal venogram was performed which demonstrated complete hepatofugal flow in the portal vein, primarily through the massive splenorenal shunt and into the inferior vena cava. Portal manometry was performed which demonstrated a mean portal pressure of 21 mm Hg. Next, a V 18 wire was inserted through the pigtail catheter which was removed. Adjacent to the safety wire, an angled tip Navicross catheter and a Glidewire Advantage were directed to the inferior vena cava and used to select the left renal vein. The catheter was exchanged for a C2 in order to select the splenorenal shunt. Venogram demonstrated brisk flow into the left renal vein which was severely dilated. Coil embolization was then performed with an assortment of 0.035 inch Azur coils. Pigtail catheter was then reinserted into the main portal/central splenic vein. Venogram was again performed which demonstrated persistent patency of the splenorenal shunt and no evidence of increased antegrade flow. Portal manometry was again performed demonstrating a mean portal pressure increased to 30 mm Hg. Therefore, intravascular ultrasound was performed to interrogate the portal vein for possible clot or stenosis. There is a focal wall adherent echogenic chronic thrombus in the mid main portal vein with significantly smaller luminal diameter than just proximal distal to the stenosis. Therefore, balloon angioplasty was performed with a 14 mm by 4 cm atlas balloon. Repeat intravascular ultrasound demonstrated improved luminal diameter with disruption of the chronic thrombus. No evidence of dissection. Repeat venogram demonstrated  persistent hepatofugal flow through the portal vein and into the  splenorenal shunt. Therefore, from the splenic vein approach, the tortuous portion of the splenorenal shunt near the indwelling coil pack was accessed with a Navicross catheter. An additional assortment of 0.035 inch Azur coils were placed. Repeat venogram demonstrated persistent patency. Therefore, a Progreat microcatheter was inserted and 60 cm followed by a 45 cm Penumbra packing coils were deployed. At this point, retrograde flow to splenorenal shunt was slowed, however stool present. Several gastric veins within beginning to fill that were not previously visualized. Given these findings, in an attempt to improve antegrade flow, TIPS endograft deployment was pursued. Under direct fluoroscopic visualization, an 8-10 mm, 7+ 2 cm Viatorr endograft was deployed. Post deployment venogram demonstrated occlusion of the indwelling endograft, likely secondary to stenosis in the intraparenchymal tract. Therefore, balloon angioplasty of the stenotic area was performed with a 6 mm x 2 cm mustang balloon. Repeat portal venogram demonstrated patency of the endograft with brisk antegrade flow 1 injected from the main portal vein. Additional splenic venogram was performed after TIPS balloon molding which demonstrated persistent retrograde flow through the splenorenal shunt in indwelling coil packs. Therefore, retrograde access to the splenorenal shunt was again pursued from the left renal vein. There was an attempt to place a 7 French sheath into the splenorenal shunt in hopes of deployment of a 14 [place] Amplatzer plug. However due to tortuosity, after multiple attempts to gain stable access, cannulation was not successful. The catheters and sheath were removed and manual compression was applied to the right internal jugular and right common femoral venous access sites until hemostasis was achieved. The patient was transferred to the PACU in stable condition. IMPRESSION: 1. Complete hepatopetal portal venous flow, the majority  exiting via massive splenorenal shunt. 2. Coil embolization of the splenorenal shunt. There is still some retrograde flow through the indwelling coil packs in the splenorenal shunt upon completion, hopeful for these to continue to thrombose over time and reverse to hepatopetal flow. 3. Balloon angioplasty of the main portal vein. 4. Small TIPS placement with post deployment balloon molding to 6 mm. 5. Ultrasound-guided paracentesis yielding 3 L ascitic fluid. Marliss Coots, MD [facility] Radiology Electronically Signed   By: Marliss Coots M.D.   On: 10/09/2021 17:02   IR Paracentesis  Result Date: 10/09/2021 CLINICAL DATA:  71 year old male with NASH cirrhosis (Child Pugh C) and severe portal hypertension exacerbated by main portal vein stenosis, likely from chronic non-occlusive thrombus, in addition to hepatofugal portal flow due to massive splenorenal shunt. Plan for TIPS approach to angioplasty portal vein with possible stent placement, followed by embolization of splenorenal shunt in hopes to establish antegrade portal flow to improve what liver function he may have left. EXAM: 1. Ultrasound-guided paracentesis 2. Ultrasound-guided access of the right internal jugular vein 3. Ultrasound-guided access of the right common femoral vein 4. Hepatic venogram 5. Intravascular ultrasound 6. Catheterization of the portal vein 7. Portal venous and central manometry 8. Portal venogram 9. Coil embolization of splenorenal shunt 10. Angioplasty of the main portal vein 11. Creation of a transhepatic portal vein to hepatic vein shunt MEDICATIONS: As antibiotic prophylaxis, Rocephin 1 gm IV was ordered pre-procedure and administered intravenously within one hour of incision. ANESTHESIA/SEDATION: General - as administered by the Anesthesia department CONTRAST:  One hundred fifty ML Omnipaque 300, intravenous FLUOROSCOPY TIME:  Fluoroscopy Time: 54.3 minutes (1,312 mGy).  COMPLICATIONS: None immediate. PROCEDURE: Informed written consent was  obtained from the patient after a thorough discussion of the procedural risks, benefits and alternatives. All questions were addressed. Maximal Sterile Barrier Technique was utilized including caps, mask, sterile gowns, sterile gloves, sterile drape, hand hygiene and skin antiseptic. A timeout was performed prior to the initiation of the procedure. The right lower quadrant was prepped and draped in standard fashion. Preprocedure ultrasound evaluation demonstrated large volume ascites. A small skin nick was made. Under direct ultrasound visualization, a 10 cm, 19 gauge Yueh needle was directed into the ascitic fluid in the needle was removed. Throughout the procedure, a total of 3 L of translucent, amber fluid were removed. A preliminary ultrasound of the right groin was performed and demonstrates a patent right common femoral vein. A permanent ultrasound image was recorded. Using a combination of fluoroscopy and ultrasound, an access site was determined. A small dermatotomy was made at the planned puncture site. Using ultrasound guidance, access into the right common femoral vein was obtained with visualization of needle entry into the vessel using a standard micropuncture technique. A wire was advanced into the IVC insert all fascial dilation performed. An 8 [place], 11 cm vascular sheath was placed into the external iliac vein. Through this access site, an 51 [place] ICE catheter was advanced with ease under fluoroscopic guidance to the level of the intrahepatic inferior vena cava. A preliminary ultrasound of the right neck was performed and demonstrates a patent internal jugular vein. A permanent ultrasound image was recorded. Using a combination of fluoroscopy and ultrasound, an access site was determined. A small dermatotomy was made at the planned puncture site. Using ultrasound guidance, access into the right internal jugular vein was  obtained with visualization of needle entry into the vessel using a standard micropuncture technique. A wire was advanced into the IVC and serial fascial dilation performed. A 10 French Gore dry seal sheath was placed into the internal jugular vein and advanced to the IVC. The jugular sheath was retracted into the right atrium and manometry was performed. Right atrial mean pressure was 16 mm Hg. A 5 French angled tip catheter was then directed into the right hepatic vein. Hepatic venogram was performed. These images demonstrated patent hepatic vein with no stenosis. The catheter was advanced to a wedge portion of the a patent vein over which the 10 French sheath was advanced into the right hepatic vein. Using ICE ultrasound visualization the catheter as right hepatic vein as well as the portal anatomy was defined. A planned exit site from the hepatic vein and puncture site from the portal vein was placed into a single sonographic plane. Under direct ultrasound visualization, the ScorpionX needle was advanced into the central right portal vein. Hand injection of contrast confirmed position within the portal system. A Glidewire Advantage was then advanced into the splenic vein. A 5 French marking pigtail catheter was then advanced over the wire into the main portal vein and wire removed. Portal venogram was performed which demonstrated complete hepatofugal flow in the portal vein, primarily through the massive splenorenal shunt and into the inferior vena cava. Portal manometry was performed which demonstrated a mean portal pressure of 21 mm Hg. Next, a V 18 wire was inserted through the pigtail catheter which was removed. Adjacent to the safety wire, an angled tip Navicross catheter and a Glidewire Advantage were directed to the inferior vena cava and used to select the left renal vein. The catheter was exchanged for a C2 in order to select the splenorenal shunt. Venogram demonstrated  brisk flow into the left renal  vein which was severely dilated. Coil embolization was then performed with an assortment of 0.035 inch Azur coils. Pigtail catheter was then reinserted into the main portal/central splenic vein. Venogram was again performed which demonstrated persistent patency of the splenorenal shunt and no evidence of increased antegrade flow. Portal manometry was again performed demonstrating a mean portal pressure increased to 30 mm Hg. Therefore, intravascular ultrasound was performed to interrogate the portal vein for possible clot or stenosis. There is a focal wall adherent echogenic chronic thrombus in the mid main portal vein with significantly smaller luminal diameter than just proximal distal to the stenosis. Therefore, balloon angioplasty was performed with a 14 mm by 4 cm atlas balloon. Repeat intravascular ultrasound demonstrated improved luminal diameter with disruption of the chronic thrombus. No evidence of dissection. Repeat venogram demonstrated persistent hepatofugal flow through the portal vein and into the splenorenal shunt. Therefore, from the splenic vein approach, the tortuous portion of the splenorenal shunt near the indwelling coil pack was accessed with a Navicross catheter. An additional assortment of 0.035 inch Azur coils were placed. Repeat venogram demonstrated persistent patency. Therefore, a Progreat microcatheter was inserted and 60 cm followed by a 45 cm Penumbra packing coils were deployed. At this point, retrograde flow to splenorenal shunt was slowed, however stool present. Several gastric veins within beginning to fill that were not previously visualized. Given these findings, in an attempt to improve antegrade flow, TIPS endograft deployment was pursued. Under direct fluoroscopic visualization, an 8-10 mm, 7+ 2 cm Viatorr endograft was deployed. Post deployment venogram demonstrated occlusion of the indwelling endograft, likely secondary to stenosis in the intraparenchymal tract. Therefore,  balloon angioplasty of the stenotic area was performed with a 6 mm x 2 cm mustang balloon. Repeat portal venogram demonstrated patency of the endograft with brisk antegrade flow 1 injected from the main portal vein. Additional splenic venogram was performed after TIPS balloon molding which demonstrated persistent retrograde flow through the splenorenal shunt in indwelling coil packs. Therefore, retrograde access to the splenorenal shunt was again pursued from the left renal vein. There was an attempt to place a 7 French sheath into the splenorenal shunt in hopes of deployment of a 14 [place] Amplatzer plug. However due to tortuosity, after multiple attempts to gain stable access, cannulation was not successful. The catheters and sheath were removed and manual compression was applied to the right internal jugular and right common femoral venous access sites until hemostasis was achieved. The patient was transferred to the PACU in stable condition. IMPRESSION: 1. Complete hepatopetal portal venous flow, the majority exiting via massive splenorenal shunt. 2. Coil embolization of the splenorenal shunt. There is still some retrograde flow through the indwelling coil packs in the splenorenal shunt upon completion, hopeful for these to continue to thrombose over time and reverse to hepatopetal flow. 3. Balloon angioplasty of the main portal vein. 4. Small TIPS placement with post deployment balloon molding to 6 mm. 5. Ultrasound-guided paracentesis yielding 3 L ascitic fluid. Marliss Coots, MD [facility] Radiology Electronically Signed   By: Marliss Coots M.D.   On: 10/09/2021 17:02   IR EMBO VENOUS NOT HEMORR HEMANG  INC GUIDE ROADMAPPING  Result Date: 10/09/2021 CLINICAL DATA:  71 year old male with NASH cirrhosis (Child Pugh C) and severe portal hypertension exacerbated by main portal vein stenosis, likely from chronic non-occlusive thrombus, in addition to hepatofugal  portal flow due to massive splenorenal shunt. Plan for  TIPS approach to angioplasty portal vein with possible stent placement, followed by embolization of splenorenal shunt in hopes to establish antegrade portal flow to improve what liver function he may have left. EXAM: 1. Ultrasound-guided paracentesis 2. Ultrasound-guided access of the right internal jugular vein 3. Ultrasound-guided access of the right common femoral vein 4. Hepatic venogram 5. Intravascular ultrasound 6. Catheterization of the portal vein 7. Portal venous and central manometry 8. Portal venogram 9. Coil embolization of splenorenal shunt 10. Angioplasty of the main portal vein 11. Creation of a transhepatic portal vein to hepatic vein shunt MEDICATIONS: As antibiotic prophylaxis, Rocephin 1 gm IV was ordered pre-procedure and administered intravenously within one hour of incision. ANESTHESIA/SEDATION: General - as administered by the Anesthesia department CONTRAST:  One hundred fifty ML Omnipaque 300, intravenous FLUOROSCOPY TIME:  Fluoroscopy Time: 54.3 minutes (1,312 mGy). COMPLICATIONS: None immediate. PROCEDURE: Informed written consent was obtained from the patient after a thorough discussion of the procedural risks, benefits and alternatives. All questions were addressed. Maximal Sterile Barrier Technique was utilized including caps, mask, sterile gowns, sterile gloves, sterile drape, hand hygiene and skin antiseptic. A timeout was performed prior to the initiation of the procedure. The right lower quadrant was prepped and draped in standard fashion. Preprocedure ultrasound evaluation demonstrated large volume ascites. A small skin nick was made. Under direct ultrasound visualization, a 10 cm, 19 gauge Yueh needle was directed into the ascitic fluid in the needle was removed. Throughout the procedure, a total of 3 L of translucent, amber fluid were removed. A preliminary ultrasound of the right groin was performed and demonstrates a patent  right common femoral vein. A permanent ultrasound image was recorded. Using a combination of fluoroscopy and ultrasound, an access site was determined. A small dermatotomy was made at the planned puncture site. Using ultrasound guidance, access into the right common femoral vein was obtained with visualization of needle entry into the vessel using a standard micropuncture technique. A wire was advanced into the IVC insert all fascial dilation performed. An 8 [place], 11 cm vascular sheath was placed into the external iliac vein. Through this access site, an 41 [place] ICE catheter was advanced with ease under fluoroscopic guidance to the level of the intrahepatic inferior vena cava. A preliminary ultrasound of the right neck was performed and demonstrates a patent internal jugular vein. A permanent ultrasound image was recorded. Using a combination of fluoroscopy and ultrasound, an access site was determined. A small dermatotomy was made at the planned puncture site. Using ultrasound guidance, access into the right internal jugular vein was obtained with visualization of needle entry into the vessel using a standard micropuncture technique. A wire was advanced into the IVC and serial fascial dilation performed. A 10 French Gore dry seal sheath was placed into the internal jugular vein and advanced to the IVC. The jugular sheath was retracted into the right atrium and manometry was performed. Right atrial mean pressure was 16 mm Hg. A 5 French angled tip catheter was then directed into the right hepatic vein. Hepatic venogram was performed. These images demonstrated patent hepatic vein with no stenosis. The catheter was advanced to a wedge portion of the a patent vein over which the 10 French sheath was advanced into the right hepatic vein. Using ICE ultrasound visualization the catheter as right hepatic vein as well as the portal anatomy was defined. A planned exit site from the hepatic vein and puncture site  from the portal vein was placed into a single sonographic  plane. Under direct ultrasound visualization, the ScorpionX needle was advanced into the central right portal vein. Hand injection of contrast confirmed position within the portal system. A Glidewire Advantage was then advanced into the splenic vein. A 5 French marking pigtail catheter was then advanced over the wire into the main portal vein and wire removed. Portal venogram was performed which demonstrated complete hepatofugal flow in the portal vein, primarily through the massive splenorenal shunt and into the inferior vena cava. Portal manometry was performed which demonstrated a mean portal pressure of 21 mm Hg. Next, a V 18 wire was inserted through the pigtail catheter which was removed. Adjacent to the safety wire, an angled tip Navicross catheter and a Glidewire Advantage were directed to the inferior vena cava and used to select the left renal vein. The catheter was exchanged for a C2 in order to select the splenorenal shunt. Venogram demonstrated brisk flow into the left renal vein which was severely dilated. Coil embolization was then performed with an assortment of 0.035 inch Azur coils. Pigtail catheter was then reinserted into the main portal/central splenic vein. Venogram was again performed which demonstrated persistent patency of the splenorenal shunt and no evidence of increased antegrade flow. Portal manometry was again performed demonstrating a mean portal pressure increased to 30 mm Hg. Therefore, intravascular ultrasound was performed to interrogate the portal vein for possible clot or stenosis. There is a focal wall adherent echogenic chronic thrombus in the mid main portal vein with significantly smaller luminal diameter than just proximal distal to the stenosis. Therefore, balloon angioplasty was performed with a 14 mm by 4 cm atlas balloon. Repeat intravascular ultrasound demonstrated improved luminal diameter with disruption of the  chronic thrombus. No evidence of dissection. Repeat venogram demonstrated persistent hepatofugal flow through the portal vein and into the splenorenal shunt. Therefore, from the splenic vein approach, the tortuous portion of the splenorenal shunt near the indwelling coil pack was accessed with a Navicross catheter. An additional assortment of 0.035 inch Azur coils were placed. Repeat venogram demonstrated persistent patency. Therefore, a Progreat microcatheter was inserted and 60 cm followed by a 45 cm Penumbra packing coils were deployed. At this point, retrograde flow to splenorenal shunt was slowed, however stool present. Several gastric veins within beginning to fill that were not previously visualized. Given these findings, in an attempt to improve antegrade flow, TIPS endograft deployment was pursued. Under direct fluoroscopic visualization, an 8-10 mm, 7+ 2 cm Viatorr endograft was deployed. Post deployment venogram demonstrated occlusion of the indwelling endograft, likely secondary to stenosis in the intraparenchymal tract. Therefore, balloon angioplasty of the stenotic area was performed with a 6 mm x 2 cm mustang balloon. Repeat portal venogram demonstrated patency of the endograft with brisk antegrade flow 1 injected from the main portal vein. Additional splenic venogram was performed after TIPS balloon molding which demonstrated persistent retrograde flow through the splenorenal shunt in indwelling coil packs. Therefore, retrograde access to the splenorenal shunt was again pursued from the left renal vein. There was an attempt to place a 7 French sheath into the splenorenal shunt in hopes of deployment of a 14 [place] Amplatzer plug. However due to tortuosity, after multiple attempts to gain stable access, cannulation was not successful. The catheters and sheath were removed and manual compression was applied to the right internal jugular and right common femoral venous access sites until hemostasis was  achieved. The patient was transferred to the PACU in stable condition. IMPRESSION: 1. Complete hepatopetal portal venous flow, the  majority exiting via massive splenorenal shunt. 2. Coil embolization of the splenorenal shunt. There is still some retrograde flow through the indwelling coil packs in the splenorenal shunt upon completion, hopeful for these to continue to thrombose over time and reverse to hepatopetal flow. 3. Balloon angioplasty of the main portal vein. 4. Small TIPS placement with post deployment balloon molding to 6 mm. 5. Ultrasound-guided paracentesis yielding 3 L ascitic fluid. Marliss Coots, MD [facility] Radiology Electronically Signed   By: Marliss Coots M.D.   On: 10/09/2021 17:02   IR INTRAVASCULAR ULTRASOUND NON CORONARY  Result Date: 10/09/2021 CLINICAL DATA:  71 year old male with NASH cirrhosis (Child Pugh C) and severe portal hypertension exacerbated by main portal vein stenosis, likely from chronic non-occlusive thrombus, in addition to hepatofugal portal flow due to massive splenorenal shunt. Plan for TIPS approach to angioplasty portal vein with possible stent placement, followed by embolization of splenorenal shunt in hopes to establish antegrade portal flow to improve what liver function he may have left. EXAM: 1. Ultrasound-guided paracentesis 2. Ultrasound-guided access of the right internal jugular vein 3. Ultrasound-guided access of the right common femoral vein 4. Hepatic venogram 5. Intravascular ultrasound 6. Catheterization of the portal vein 7. Portal venous and central manometry 8. Portal venogram 9. Coil embolization of splenorenal shunt 10. Angioplasty of the main portal vein 11. Creation of a transhepatic portal vein to hepatic vein shunt MEDICATIONS: As antibiotic prophylaxis, Rocephin 1 gm IV was ordered pre-procedure and administered intravenously within one hour of incision. ANESTHESIA/SEDATION: General - as  administered by the Anesthesia department CONTRAST:  One hundred fifty ML Omnipaque 300, intravenous FLUOROSCOPY TIME:  Fluoroscopy Time: 54.3 minutes (1,312 mGy). COMPLICATIONS: None immediate. PROCEDURE: Informed written consent was obtained from the patient after a thorough discussion of the procedural risks, benefits and alternatives. All questions were addressed. Maximal Sterile Barrier Technique was utilized including caps, mask, sterile gowns, sterile gloves, sterile drape, hand hygiene and skin antiseptic. A timeout was performed prior to the initiation of the procedure. The right lower quadrant was prepped and draped in standard fashion. Preprocedure ultrasound evaluation demonstrated large volume ascites. A small skin nick was made. Under direct ultrasound visualization, a 10 cm, 19 gauge Yueh needle was directed into the ascitic fluid in the needle was removed. Throughout the procedure, a total of 3 L of translucent, amber fluid were removed. A preliminary ultrasound of the right groin was performed and demonstrates a patent right common femoral vein. A permanent ultrasound image was recorded. Using a combination of fluoroscopy and ultrasound, an access site was determined. A small dermatotomy was made at the planned puncture site. Using ultrasound guidance, access into the right common femoral vein was obtained with visualization of needle entry into the vessel using a standard micropuncture technique. A wire was advanced into the IVC insert all fascial dilation performed. An 8 [place], 11 cm vascular sheath was placed into the external iliac vein. Through this access site, an 58 [place] ICE catheter was advanced with ease under fluoroscopic guidance to the level of the intrahepatic inferior vena cava. A preliminary ultrasound of the right neck was performed and demonstrates a patent internal jugular vein. A permanent ultrasound image was recorded. Using a combination of fluoroscopy and ultrasound,  an access site was determined. A small dermatotomy was made at the planned puncture site. Using ultrasound guidance, access into the right internal jugular vein was obtained with visualization of needle entry into the vessel using  a standard micropuncture technique. A wire was advanced into the IVC and serial fascial dilation performed. A 10 French Gore dry seal sheath was placed into the internal jugular vein and advanced to the IVC. The jugular sheath was retracted into the right atrium and manometry was performed. Right atrial mean pressure was 16 mm Hg. A 5 French angled tip catheter was then directed into the right hepatic vein. Hepatic venogram was performed. These images demonstrated patent hepatic vein with no stenosis. The catheter was advanced to a wedge portion of the a patent vein over which the 10 French sheath was advanced into the right hepatic vein. Using ICE ultrasound visualization the catheter as right hepatic vein as well as the portal anatomy was defined. A planned exit site from the hepatic vein and puncture site from the portal vein was placed into a single sonographic plane. Under direct ultrasound visualization, the ScorpionX needle was advanced into the central right portal vein. Hand injection of contrast confirmed position within the portal system. A Glidewire Advantage was then advanced into the splenic vein. A 5 French marking pigtail catheter was then advanced over the wire into the main portal vein and wire removed. Portal venogram was performed which demonstrated complete hepatofugal flow in the portal vein, primarily through the massive splenorenal shunt and into the inferior vena cava. Portal manometry was performed which demonstrated a mean portal pressure of 21 mm Hg. Next, a V 18 wire was inserted through the pigtail catheter which was removed. Adjacent to the safety wire, an angled tip Navicross catheter and a Glidewire Advantage were directed to the inferior vena cava and used  to select the left renal vein. The catheter was exchanged for a C2 in order to select the splenorenal shunt. Venogram demonstrated brisk flow into the left renal vein which was severely dilated. Coil embolization was then performed with an assortment of 0.035 inch Azur coils. Pigtail catheter was then reinserted into the main portal/central splenic vein. Venogram was again performed which demonstrated persistent patency of the splenorenal shunt and no evidence of increased antegrade flow. Portal manometry was again performed demonstrating a mean portal pressure increased to 30 mm Hg. Therefore, intravascular ultrasound was performed to interrogate the portal vein for possible clot or stenosis. There is a focal wall adherent echogenic chronic thrombus in the mid main portal vein with significantly smaller luminal diameter than just proximal distal to the stenosis. Therefore, balloon angioplasty was performed with a 14 mm by 4 cm atlas balloon. Repeat intravascular ultrasound demonstrated improved luminal diameter with disruption of the chronic thrombus. No evidence of dissection. Repeat venogram demonstrated persistent hepatofugal flow through the portal vein and into the splenorenal shunt. Therefore, from the splenic vein approach, the tortuous portion of the splenorenal shunt near the indwelling coil pack was accessed with a Navicross catheter. An additional assortment of 0.035 inch Azur coils were placed. Repeat venogram demonstrated persistent patency. Therefore, a Progreat microcatheter was inserted and 60 cm followed by a 45 cm Penumbra packing coils were deployed. At this point, retrograde flow to splenorenal shunt was slowed, however stool present. Several gastric veins within beginning to fill that were not previously visualized. Given these findings, in an attempt to improve antegrade flow, TIPS endograft deployment was pursued. Under direct fluoroscopic visualization, an 8-10 mm, 7+ 2 cm Viatorr endograft  was deployed. Post deployment venogram demonstrated occlusion of the indwelling endograft, likely secondary to stenosis in the intraparenchymal tract. Therefore, balloon angioplasty of the stenotic area was performed with  a 6 mm x 2 cm mustang balloon. Repeat portal venogram demonstrated patency of the endograft with brisk antegrade flow 1 injected from the main portal vein. Additional splenic venogram was performed after TIPS balloon molding which demonstrated persistent retrograde flow through the splenorenal shunt in indwelling coil packs. Therefore, retrograde access to the splenorenal shunt was again pursued from the left renal vein. There was an attempt to place a 7 French sheath into the splenorenal shunt in hopes of deployment of a 14 [place] Amplatzer plug. However due to tortuosity, after multiple attempts to gain stable access, cannulation was not successful. The catheters and sheath were removed and manual compression was applied to the right internal jugular and right common femoral venous access sites until hemostasis was achieved. The patient was transferred to the PACU in stable condition. IMPRESSION: 1. Complete hepatopetal portal venous flow, the majority exiting via massive splenorenal shunt. 2. Coil embolization of the splenorenal shunt. There is still some retrograde flow through the indwelling coil packs in the splenorenal shunt upon completion, hopeful for these to continue to thrombose over time and reverse to hepatopetal flow. 3. Balloon angioplasty of the main portal vein. 4. Small TIPS placement with post deployment balloon molding to 6 mm. 5. Ultrasound-guided paracentesis yielding 3 L ascitic fluid. Marliss Coots, MD [facility] Radiology Electronically Signed   By: Marliss Coots M.D.   On: 10/09/2021 17:02   IR INTRAVASCULAR ULTRASOUND NON CORONARY  Result Date: 10/09/2021 CLINICAL DATA:  71 year old male with NASH cirrhosis (Child Pugh  C) and severe portal hypertension exacerbated by main portal vein stenosis, likely from chronic non-occlusive thrombus, in addition to hepatofugal portal flow due to massive splenorenal shunt. Plan for TIPS approach to angioplasty portal vein with possible stent placement, followed by embolization of splenorenal shunt in hopes to establish antegrade portal flow to improve what liver function he may have left. EXAM: 1. Ultrasound-guided paracentesis 2. Ultrasound-guided access of the right internal jugular vein 3. Ultrasound-guided access of the right common femoral vein 4. Hepatic venogram 5. Intravascular ultrasound 6. Catheterization of the portal vein 7. Portal venous and central manometry 8. Portal venogram 9. Coil embolization of splenorenal shunt 10. Angioplasty of the main portal vein 11. Creation of a transhepatic portal vein to hepatic vein shunt MEDICATIONS: As antibiotic prophylaxis, Rocephin 1 gm IV was ordered pre-procedure and administered intravenously within one hour of incision. ANESTHESIA/SEDATION: General - as administered by the Anesthesia department CONTRAST:  One hundred fifty ML Omnipaque 300, intravenous FLUOROSCOPY TIME:  Fluoroscopy Time: 54.3 minutes (1,312 mGy). COMPLICATIONS: None immediate. PROCEDURE: Informed written consent was obtained from the patient after a thorough discussion of the procedural risks, benefits and alternatives. All questions were addressed. Maximal Sterile Barrier Technique was utilized including caps, mask, sterile gowns, sterile gloves, sterile drape, hand hygiene and skin antiseptic. A timeout was performed prior to the initiation of the procedure. The right lower quadrant was prepped and draped in standard fashion. Preprocedure ultrasound evaluation demonstrated large volume ascites. A small skin nick was made. Under direct ultrasound visualization, a 10 cm, 19 gauge Yueh needle was directed into the ascitic fluid in the needle was removed. Throughout the  procedure, a total of 3 L of translucent, amber fluid were removed. A preliminary ultrasound of the right groin was performed and demonstrates a patent right common femoral vein. A permanent ultrasound image was recorded. Using a combination of fluoroscopy and ultrasound, an access site was determined. A small dermatotomy was  made at the planned puncture site. Using ultrasound guidance, access into the right common femoral vein was obtained with visualization of needle entry into the vessel using a standard micropuncture technique. A wire was advanced into the IVC insert all fascial dilation performed. An 8 [place], 11 cm vascular sheath was placed into the external iliac vein. Through this access site, an 61 [place] ICE catheter was advanced with ease under fluoroscopic guidance to the level of the intrahepatic inferior vena cava. A preliminary ultrasound of the right neck was performed and demonstrates a patent internal jugular vein. A permanent ultrasound image was recorded. Using a combination of fluoroscopy and ultrasound, an access site was determined. A small dermatotomy was made at the planned puncture site. Using ultrasound guidance, access into the right internal jugular vein was obtained with visualization of needle entry into the vessel using a standard micropuncture technique. A wire was advanced into the IVC and serial fascial dilation performed. A 10 French Gore dry seal sheath was placed into the internal jugular vein and advanced to the IVC. The jugular sheath was retracted into the right atrium and manometry was performed. Right atrial mean pressure was 16 mm Hg. A 5 French angled tip catheter was then directed into the right hepatic vein. Hepatic venogram was performed. These images demonstrated patent hepatic vein with no stenosis. The catheter was advanced to a wedge portion of the a patent vein over which the 10 French sheath was advanced into the right hepatic vein. Using ICE ultrasound  visualization the catheter as right hepatic vein as well as the portal anatomy was defined. A planned exit site from the hepatic vein and puncture site from the portal vein was placed into a single sonographic plane. Under direct ultrasound visualization, the ScorpionX needle was advanced into the central right portal vein. Hand injection of contrast confirmed position within the portal system. A Glidewire Advantage was then advanced into the splenic vein. A 5 French marking pigtail catheter was then advanced over the wire into the main portal vein and wire removed. Portal venogram was performed which demonstrated complete hepatofugal flow in the portal vein, primarily through the massive splenorenal shunt and into the inferior vena cava. Portal manometry was performed which demonstrated a mean portal pressure of 21 mm Hg. Next, a V 18 wire was inserted through the pigtail catheter which was removed. Adjacent to the safety wire, an angled tip Navicross catheter and a Glidewire Advantage were directed to the inferior vena cava and used to select the left renal vein. The catheter was exchanged for a C2 in order to select the splenorenal shunt. Venogram demonstrated brisk flow into the left renal vein which was severely dilated. Coil embolization was then performed with an assortment of 0.035 inch Azur coils. Pigtail catheter was then reinserted into the main portal/central splenic vein. Venogram was again performed which demonstrated persistent patency of the splenorenal shunt and no evidence of increased antegrade flow. Portal manometry was again performed demonstrating a mean portal pressure increased to 30 mm Hg. Therefore, intravascular ultrasound was performed to interrogate the portal vein for possible clot or stenosis. There is a focal wall adherent echogenic chronic thrombus in the mid main portal vein with significantly smaller luminal diameter than just proximal distal to the stenosis. Therefore, balloon  angioplasty was performed with a 14 mm by 4 cm atlas balloon. Repeat intravascular ultrasound demonstrated improved luminal diameter with disruption of the chronic thrombus. No evidence of dissection. Repeat venogram demonstrated persistent hepatofugal flow  through the portal vein and into the splenorenal shunt. Therefore, from the splenic vein approach, the tortuous portion of the splenorenal shunt near the indwelling coil pack was accessed with a Navicross catheter. An additional assortment of 0.035 inch Azur coils were placed. Repeat venogram demonstrated persistent patency. Therefore, a Progreat microcatheter was inserted and 60 cm followed by a 45 cm Penumbra packing coils were deployed. At this point, retrograde flow to splenorenal shunt was slowed, however stool present. Several gastric veins within beginning to fill that were not previously visualized. Given these findings, in an attempt to improve antegrade flow, TIPS endograft deployment was pursued. Under direct fluoroscopic visualization, an 8-10 mm, 7+ 2 cm Viatorr endograft was deployed. Post deployment venogram demonstrated occlusion of the indwelling endograft, likely secondary to stenosis in the intraparenchymal tract. Therefore, balloon angioplasty of the stenotic area was performed with a 6 mm x 2 cm mustang balloon. Repeat portal venogram demonstrated patency of the endograft with brisk antegrade flow 1 injected from the main portal vein. Additional splenic venogram was performed after TIPS balloon molding which demonstrated persistent retrograde flow through the splenorenal shunt in indwelling coil packs. Therefore, retrograde access to the splenorenal shunt was again pursued from the left renal vein. There was an attempt to place a 7 French sheath into the splenorenal shunt in hopes of deployment of a 14 [place] Amplatzer plug. However due to tortuosity, after multiple attempts to gain stable access, cannulation was not successful. The catheters  and sheath were removed and manual compression was applied to the right internal jugular and right common femoral venous access sites until hemostasis was achieved. The patient was transferred to the PACU in stable condition. IMPRESSION: 1. Complete hepatopetal portal venous flow, the majority exiting via massive splenorenal shunt. 2. Coil embolization of the splenorenal shunt. There is still some retrograde flow through the indwelling coil packs in the splenorenal shunt upon completion, hopeful for these to continue to thrombose over time and reverse to hepatopetal flow. 3. Balloon angioplasty of the main portal vein. 4. Small TIPS placement with post deployment balloon molding to 6 mm. 5. Ultrasound-guided paracentesis yielding 3 L ascitic fluid. Marliss Coots, MD [facility] Radiology Electronically Signed   By: Marliss Coots M.D.   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 Plan for TIPS approach to angioplasty portal vein with possible stent placement, followed by embolization of splenorenal shunt in hopes to establish antegrade portal flow to improve what liver function he may have left. EXAM: 1. Ultrasound-guided paracentesis 2. Ultrasound-guided access of the right internal jugular vein 3. Ultrasound-guided access of the right common femoral vein 4. Hepatic venogram 5. Intravascular ultrasound 6. Catheterization of the portal vein 7. Portal venous and central manometry 8. Portal venogram 9. Coil embolization of splenorenal shunt 10. Angioplasty of the main portal vein 11. Creation of a transhepatic portal vein to hepatic vein shunt MEDICATIONS:  As antibiotic prophylaxis, Rocephin 1 gm IV was ordered pre-procedure and administered intravenously within one hour of incision. ANESTHESIA/SEDATION: General - as administered by the Anesthesia department CONTRAST:  One hundred fifty ML Omnipaque 300, intravenous FLUOROSCOPY TIME:  Fluoroscopy Time: 54.3 minutes (1,312 mGy). COMPLICATIONS: None immediate. PROCEDURE:  Informed written consent was obtained from the patient after a thorough discussion of the procedural risks, benefits and alternatives. All questions were addressed. Maximal Sterile Barrier Technique was utilized including caps, mask, sterile gowns, sterile gloves, sterile drape, hand hygiene and skin antiseptic. A timeout was performed prior to the initiation of the procedure. The right lower quadrant was prepped and draped in standard fashion. Preprocedure ultrasound evaluation demonstrated large volume ascites. A small skin nick was made. Under direct ultrasound visualization, a 10 cm, 19 gauge Yueh needle was directed into the ascitic fluid in the needle was removed. Throughout the procedure, a total of 3 L of translucent, amber fluid were removed. A preliminary ultrasound of the right groin was performed and demonstrates a patent right common femoral vein. A permanent ultrasound image was recorded. Using a combination of fluoroscopy and ultrasound, an access site was determined. A small dermatotomy was made at the planned puncture site. Using ultrasound guidance, access into the right common femoral vein was obtained with visualization of needle entry into the vessel using a standard micropuncture technique. A wire was advanced into the IVC insert all fascial dilation performed. An 8 [place], 11 cm vascular sheath was placed into the external iliac vein. Through this access site, an 23 [place] ICE catheter was advanced with ease under fluoroscopic guidance to the level of the intrahepatic inferior vena cava. A preliminary ultrasound of the  right neck was performed and demonstrates a patent internal jugular vein. A permanent ultrasound image was recorded. Using a combination of fluoroscopy and ultrasound, an access site was determined. A small dermatotomy was made at the planned puncture site. Using ultrasound guidance, access into the right internal jugular vein was obtained with visualization of needle entry into the vessel using a standard micropuncture technique. A wire was advanced into the IVC and serial fascial dilation performed. A 10 French Gore dry seal sheath was placed into the internal jugular vein and advanced to the IVC. The jugular sheath was retracted into the right atrium and manometry was performed. Right atrial mean pressure was 16 mm Hg. A 5 French angled tip catheter was then directed into the right hepatic vein. Hepatic venogram was performed. These images demonstrated patent hepatic vein with no stenosis. The catheter was advanced to a wedge portion of the a patent vein over which the 10 French sheath was advanced into the right hepatic vein. Using ICE ultrasound visualization the catheter as right hepatic vein as well as the portal anatomy was defined. A planned exit site from the hepatic vein and puncture site from the portal vein was placed into a single sonographic plane. Under direct ultrasound visualization, the ScorpionX needle was advanced into the central right portal vein. Hand injection of contrast confirmed position within the portal system. A Glidewire Advantage was then advanced into the splenic vein. A 5 French marking pigtail catheter was then advanced over the wire into the main portal vein and wire removed. Portal venogram was performed which demonstrated complete hepatofugal flow in the portal vein, primarily through the massive splenorenal shunt and into the inferior vena cava. Portal manometry was performed which demonstrated a mean portal pressure of 21 mm Hg. Next, a V 18 wire was inserted through the  pigtail catheter which was removed. Adjacent to the safety wire, an angled tip Navicross catheter and a Glidewire Advantage were directed to the inferior vena cava and used to select the left renal vein. The catheter was exchanged for a C2 in order to select  the splenorenal shunt. Venogram demonstrated brisk flow into the left renal vein which was severely dilated. Coil embolization was then performed with an assortment of 0.035 inch Azur coils. Pigtail catheter was then reinserted into the main portal/central splenic vein. Venogram was again performed which demonstrated persistent patency of the splenorenal shunt and no evidence of increased antegrade flow. Portal manometry was again performed demonstrating a mean portal pressure increased to 30 mm Hg. Therefore, intravascular ultrasound was performed to interrogate the portal vein for possible clot or stenosis. There is a focal wall adherent echogenic chronic thrombus in the mid main portal vein with significantly smaller luminal diameter than just proximal distal to the stenosis. Therefore, balloon angioplasty was performed with a 14 mm by 4 cm atlas balloon. Repeat intravascular ultrasound demonstrated improved luminal diameter with disruption of the chronic thrombus. No evidence of dissection. Repeat venogram demonstrated persistent hepatofugal flow through the portal vein and into the splenorenal shunt. Therefore, from the splenic vein approach, the tortuous portion of the splenorenal shunt near the indwelling coil pack was accessed with a Navicross catheter. An additional assortment of 0.035 inch Azur coils were placed. Repeat venogram demonstrated persistent patency. Therefore, a Progreat microcatheter was inserted and 60 cm followed by a 45 cm Penumbra packing coils were deployed. At this point, retrograde flow to splenorenal shunt was slowed, however stool present. Several gastric veins within beginning to fill that were not previously visualized. Given  these findings, in an attempt to improve antegrade flow, TIPS endograft deployment was pursued. Under direct fluoroscopic visualization, an 8-10 mm, 7+ 2 cm Viatorr endograft was deployed. Post deployment venogram demonstrated occlusion of the indwelling endograft, likely secondary to stenosis in the intraparenchymal tract. Therefore, balloon angioplasty of the stenotic area was performed with a 6 mm x 2 cm mustang balloon. Repeat portal venogram demonstrated patency of the endograft with brisk antegrade flow 1 injected from the main portal vein. Additional splenic venogram was performed after TIPS balloon molding which demonstrated persistent retrograde flow through the splenorenal shunt in indwelling coil packs. Therefore, retrograde access to the splenorenal shunt was again pursued from the left renal vein. There was an attempt to place a 7 French sheath into the splenorenal shunt in hopes of deployment of a 14 [place] Amplatzer plug. However due to tortuosity, after multiple attempts to gain stable access, cannulation was not successful. The catheters and sheath were removed and manual compression was applied to the right internal jugular and right common femoral venous access sites until hemostasis was achieved. The patient was transferred to the PACU in stable condition. IMPRESSION: 1. Complete hepatopetal portal venous flow, the majority exiting via massive splenorenal shunt. 2. Coil embolization of the splenorenal shunt. There is still some retrograde flow through the indwelling coil packs in the splenorenal shunt upon completion, hopeful for these to continue to thrombose over time and reverse to hepatopetal flow. 3. Balloon angioplasty of the main portal vein. 4. Small TIPS placement with post deployment balloon molding to 6 mm. 5. Ultrasound-guided paracentesis yielding 3 L ascitic fluid. Marliss Coots, MD [facility] Radiology Electronically Signed   By:  Marliss Coots M.D.   On: 10/09/2021 17:02   DG CHEST PORT 1 VIEW  Result Date: 10/09/2021 CLINICAL DATA:  Central line placement EXAM: PORTABLE CHEST 1 VIEW COMPARISON:  Portable exam 1420 hours compared to 10/07/2021 FINDINGS: Tip of endotracheal tube projects 1.8 cm above carina. LEFT jugular central venous catheter tip projects over SVC at the level  of the azygous arch. Stable heart size and mediastinal contours. Atherosclerotic calcification aorta. BILATERAL pulmonary infiltrates increased from previous exam. Probable layered RIGHT pleural effusion. No pneumothorax. IMPRESSION: Increased BILATERAL pulmonary infiltrates with probable RIGHT pleural effusion. No pneumothorax following LEFT jugular line placement. Aortic Atherosclerosis (ICD10-I70.0). Electronically Signed   By: Ulyses Southward M.D.   On: 10/09/2021 14:29   DG CHEST PORT 1 VIEW  Result Date: 10/07/2021 CLINICAL DATA:  Intubation. EXAM: PORTABLE CHEST 1 VIEW COMPARISON:  Radiographs 10/06/2021 and 10/05/2021. FINDINGS: 1401 hours. Interval intubation. Tip of the endotracheal tube is above the carina which is not well defined. The heart size and mediastinal contours are stable with aortic atherosclerosis. Persistent vascular congestion and asymmetric veiling opacity on the right, likely due to a right pleural effusion. No evidence of pneumothorax. Degenerative changes throughout the spine. Telemetry leads overlie the chest. IMPRESSION: 1. Satisfactory position of the endotracheal tube. 2. Vascular congestion with probable asymmetric right pleural effusion, as before. Electronically Signed   By: Carey Bullocks M.D.   On: 10/07/2021 14:10    Labs:  CBC: Recent Labs    10/09/21 1748 10/10/21 0334 10/10/21 1817 10/11/21 0338 10/11/21 0530  WBC 5.5 10.0 10.1  --  10.1  HGB 8.1* 7.6* 7.3*  --  7.3*  HCT 22.9* 22.1* 21.3*  --  21.0*  PLT 32* 43*  43* 55* 50* 48*     COAGS: Recent Labs    10/08/21 1020 10/09/21 0039 10/09/21 0511  10/09/21 1340 10/10/21 0334 10/11/21 0338  INR 3.8* 3.0* 3.0* 1.8* 2.8* 3.4*  APTT 53* 52*  --   --  50* 54*     BMP: Recent Labs    10/09/21 0039 10/09/21 0511 10/09/21 1026 10/09/21 1304 10/09/21 1535 10/10/21 0334 10/11/21 0338  NA 142 139   < > 140 141 142 143  K 3.2* 3.3*   < > 3.7 3.7 3.6 3.5  CL 109 110  --  104  --  110 112*  CO2 20* 21*  --   --   --  22 23  GLUCOSE 86 96  --  130*  --  129* 142*  BUN 94* 98*  --  99*  --  95* 95*  CALCIUM 7.5* 7.1*  --   --   --  8.5* 8.2*  CREATININE 2.76* 2.74*  --  2.60*  --  2.30* 1.94*  GFRNONAA 24* 24*  --   --   --  30* 36*   < > = values in this interval not displayed.     LIVER FUNCTION TESTS: Recent Labs    10/08/21 0106 10/09/21 0511 10/10/21 0334 10/11/21 0338  BILITOT 10.3* 9.9* 10.1* 11.2*  AST 841* 399* 158* 110*  ALT 528* 273* 110* 65*  ALKPHOS 62 64 54 62  PROT 3.9* 3.5* 3.9* 3.5*  ALBUMIN 2.4* 1.9* 2.3* 2.0*     Assessment and Plan: Large splenorenal shunt s/p coil embolization reducing splenorenal shunt by 90%, portal vein angioplasty, as well as small (6mm) TIPS creation.  Patient alert, off pressors.   LFTs continue to trend down, however Tbili up to 11.2-- Tbili labwork not fractionated, could have hemolysis component.  Renal function also improved, Scr 1.94  Global anasarca noted on exam today.   All procedure site intact, clean, small amount of weeping from edema but no oozing/hemodynamic compromise.  IR will continue to follow.  Please assess MELD-related labs daily for now.  Electronically Signed: Hoyt Koch, PA 10/11/2021, 12:18  PM   I spent a total of 15 Minutes at the the patient's bedside AND on the patient's hospital floor or unit, greater than 50% of which was counseling/coordinating care for GI bleed, splenorenal shunt.

## 2021-10-11 NOTE — Progress Notes (Signed)
NAME:  Cody Austin, MRN:  [id number], DOB:  July 18, 1950, LOS: 6 ADMISSION DATE:  10/05/2021, CONSULTATION DATE:  10/05/21 REFERRING MD:  [facility], CHIEF COMPLAINT:  Hemorrhagic shock   History of Present Illness:  71 yowm NCB pt  with NASH cirrhosis  receiving paracentesis approximately every 2 weeks yielding 4.5 most recently on 09/30/21 with w/u c/w main portal vein stenosis likely secondary to chronic non-occlusive thrombus, a massive splenorenal shunt, advanced cirrhosis, hepatocellular carcinoma, hepatorenal syndrome, and hyperbilirubinemia.  He was recently admitted to [facility] with acute anemia and melena, to complicate his case further, and underwent EGD (09/23/21) which showed grade 1 esophageal varices and moderate diffuse portal hypertensive gastropathy with antral bleeding that was controlled with hemostatic spray.  He required blood transfusion during this admission.    Presented am 6/19 to [facility] ER with weakness, confusion  and low cbg/ hgb and PCCM consulted re transfer to [facility] where he is scheduled to undergo attempting portal vein angioplasty/stenting and shunt embolization on 6/30 with main complication = florid terminal liver failure per IR's note 10/01/21 and pt agreed as alternative to repeat massive paracentesis.   Pertinent  Medical History  NASh cirrhosis complicated by recurrent ascites, portal vein stenosis secondary to chronic nonocclusive thrombus, splenorenal shunt, hepatocellular carcinoma, hepatorenal syndrome, grade 1 varices, portal hypertensive gastropathy, OSA  Significant Hospital Events: Including procedures, antibiotic start and stop dates in addition to other pertinent events   6/19 Presented to [facility] and transferred [facility] 6/21 EGD with no focal source of bleed. Likely secondary to PHG. S/p hemostatic spray 6/23 S/p splenorenal shunt embolization and TIPS creation with reduction of flow by 90%. Paracentesis performed with 3L removed 6/24 Weaned off pressors.  Advanced to clear liquid diet  Interim History / Subjective:  Off pressors. Difficulty with clears. Plan for formal swallow eval  Objective   Blood pressure (!) 120/59, pulse 83, temperature 98.1 F (36.7 C), resp. rate 13, height 5\' 8"  (1.727 m), weight 105.9 kg, SpO2 100 %.        Intake/Output Summary (Last 24 hours) at 10/11/2021 0918 Last data filed at 10/11/2021 0800 Gross per 24 hour  Intake 1494.82 ml  Output 3720 ml  Net -2225.18 ml   Filed Weights   10/09/21 0442 10/10/21 0500 10/11/21 0500  Weight: 108 kg 107.3 kg 105.9 kg   Physical Exam: General: Chronically ill-appearing, no acute distress, jaundiced HENT: [place], AT, OP clear, MMM Eyes: EOMI, no scleral icterus Respiratory: Clear to auscultation bilaterally.  No crackles, wheezing or rales Cardiovascular: RRR, -M/R/G, no JVD GI: BS+, distended with pitting edema, improved  Extremities: 1-2+ pitting edema up to abdomen, improving Neuro: AAO x4, CNII-XII grossly intact Skin: Scattered ecchymosis, bruising Psych: Normal mood, normal affect GU: Foley in place  K 3.5 T bili 11.2 T-bili increased LFTs improving Cr improving Plt 48 INR 3.4 Fibrinogen 95  Resolved Hospital Problem list     Assessment & Plan:   Hemorrhagic shock/acute on chronic blood loss anemia - Shock resolved EGD 6/21 with no focal source of bleed. Likely secondary to PHG Elevated INR - improved ?DIC --S/p PRBC x 6 FFP x 6, plt x 5, cryo pool x 1, vitamin K 10 mg x 3 since since admission --6/23 PRBC x 4, FFP x4, cryo x 1, plt x 1 given. --S/p albumin 50g x 3 --Appreciate GI assistance. Recommend plt goal >50k and fibrinogen >120. No further GI intervention recommended as bleed is likely related to PHG --Continue PPI  BID --Dc'd Octreotide 6/21 --Trend CBC, CMET, DIC --Transfuse FFP x 2, platelet 1U and cryo x 2  Decompensated NASH cirrhosis with recurrent ascites s/p splenorenal shunt embolization and TIPS creation 6/24 Possible HCC  - not a candidate for chemo or TACE Hepatorenal syndrome LFTs improving Followed by GI [place]. Not a transplant candidate due to CADx3 Previously evaluated by IR for portal vein thrombus. Not a candidate for TIPS but considering stent Hx of paracentesis x 5 in the last two months --s/p splenorenal shunt embolization and TIPS creation 6/24 --Last paracentesis with 3L volume removal 6/24 --Diuresis --Appreciate GI input --Continue lactulose enema. Hold Xifaxan while NPO --Foley  Klebsiella pneumonia bacteremia --De-escalate to Ancef based on culture sensitivities for 10-14 day course (6/19>) --Follow-up blood cultures for clearance. 6/21 NGTD  NSTEMI in setting of anemia and shock CAD Severe aortic stenosis Trop 7131>14. Not a candidate for intervention with acute illness Echo EF preserved. Elevated RVSP in setting of liver failure  AKI on CKD IIIa - stable. Good UOP Metabolic acidosis --Monitor UOP/Cr --Avoid nephrotoxic agents  Hypoglycemia --Continue D20 gtt --Speech  Hypothyroidism --Restart when able to take PO  OSA --Nightly CPAP  Best Practice (right click and "Reselect all SmartList Selections" daily)   Diet/type: clear liquids DVT prophylaxis: not indicated bleed GI prophylaxis: PPI Lines: N/A Foley:  Yes, and it is still needed Code Status:  DNR Last date of multidisciplinary goals of care discussion [6/19] DNR. Updated family on 6/23 in am  Critical care time: 45 min   The patient is critically ill with multiple organ systems failure and requires high complexity decision making for assessment and support, frequent evaluation and titration of therapies, application of advanced monitoring technologies and extensive interpretation of multiple databases.  Independent Critical Care Time: 45 Minutes.   Mechele Collin, M.D. [facility] Pulmonary/Critical Care Medicine 10/11/2021 9:19 AM   Please see Amion for pager number to reach on-call Pulmonary and Critical  Care Team.

## 2021-10-12 DIAGNOSIS — D62 Acute posthemorrhagic anemia: Secondary | ICD-10-CM

## 2021-10-12 DIAGNOSIS — K7581 Nonalcoholic steatohepatitis (NASH): Secondary | ICD-10-CM | POA: Diagnosis not present

## 2021-10-12 DIAGNOSIS — R188 Other ascites: Secondary | ICD-10-CM | POA: Diagnosis not present

## 2021-10-12 DIAGNOSIS — N179 Acute kidney failure, unspecified: Secondary | ICD-10-CM | POA: Diagnosis not present

## 2021-10-12 DIAGNOSIS — K766 Portal hypertension: Secondary | ICD-10-CM

## 2021-10-12 DIAGNOSIS — K922 Gastrointestinal hemorrhage, unspecified: Secondary | ICD-10-CM | POA: Diagnosis not present

## 2021-10-12 DIAGNOSIS — D684 Acquired coagulation factor deficiency: Secondary | ICD-10-CM | POA: Diagnosis not present

## 2021-10-12 DIAGNOSIS — R579 Shock, unspecified: Secondary | ICD-10-CM | POA: Diagnosis not present

## 2021-10-12 DIAGNOSIS — K7460 Unspecified cirrhosis of liver: Secondary | ICD-10-CM | POA: Diagnosis not present

## 2021-10-12 LAB — BPAM PLATELET PHERESIS
Blood Product Expiration Date: 202306262359
ISSUE DATE / TIME: 202306251335
Unit Type and Rh: 9500

## 2021-10-12 LAB — COMPREHENSIVE METABOLIC PANEL
ALT: 35 U/L (ref 0–44)
ALT: 23 U/L (ref 0–44)
AST: 81 U/L — ABNORMAL HIGH (ref 15–41)
AST: 72 U/L — ABNORMAL HIGH (ref 15–41)
Albumin: 1.9 g/dL — ABNORMAL LOW (ref 3.5–5.0)
Albumin: 1.8 g/dL — ABNORMAL LOW (ref 3.5–5.0)
Alkaline Phosphatase: 68 U/L (ref 38–126)
Alkaline Phosphatase: 77 U/L (ref 38–126)
Anion gap: 4 — ABNORMAL LOW (ref 5–15)
Anion gap: 7 (ref 5–15)
BUN: 89 mg/dL — ABNORMAL HIGH (ref 8–23)
BUN: 80 mg/dL — ABNORMAL HIGH (ref 8–23)
CO2: 27 mmol/L (ref 22–32)
CO2: 26 mmol/L (ref 22–32)
Calcium: 8 mg/dL — ABNORMAL LOW (ref 8.9–10.3)
Calcium: 7.7 mg/dL — ABNORMAL LOW (ref 8.9–10.3)
Chloride: 111 mmol/L (ref 98–111)
Chloride: 106 mmol/L (ref 98–111)
Creatinine, Ser: 1.63 mg/dL — ABNORMAL HIGH (ref 0.61–1.24)
Creatinine, Ser: 1.42 mg/dL — ABNORMAL HIGH (ref 0.61–1.24)
GFR, Estimated: 45 mL/min — ABNORMAL LOW (ref >60)
GFR, Estimated: 53 mL/min — ABNORMAL LOW (ref >60)
Glucose, Bld: 116 mg/dL — ABNORMAL HIGH (ref 70–99)
Glucose, Bld: 116 mg/dL — ABNORMAL HIGH (ref 70–99)
Potassium: 3.4 mmol/L — ABNORMAL LOW (ref 3.5–5.1)
Potassium: 3.4 mmol/L — ABNORMAL LOW (ref 3.5–5.1)
Sodium: 142 mmol/L (ref 135–145)
Sodium: 139 mmol/L (ref 135–145)
Total Bilirubin: 12.4 mg/dL — ABNORMAL HIGH (ref 0.3–1.2)
Total Bilirubin: 13.2 mg/dL — ABNORMAL HIGH (ref 0.3–1.2)
Total Protein: 3.6 g/dL — ABNORMAL LOW (ref 6.5–8.1)
Total Protein: 3.5 g/dL — ABNORMAL LOW (ref 6.5–8.1)

## 2021-10-12 LAB — GLUCOSE, CAPILLARY
Glucose-Capillary: 112 mg/dL — ABNORMAL HIGH (ref 70–99)
Glucose-Capillary: 87 mg/dL (ref 70–99)
Glucose-Capillary: 129 mg/dL — ABNORMAL HIGH (ref 70–99)
Glucose-Capillary: 112 mg/dL — ABNORMAL HIGH (ref 70–99)
Glucose-Capillary: 119 mg/dL — ABNORMAL HIGH (ref 70–99)
Glucose-Capillary: 108 mg/dL — ABNORMAL HIGH (ref 70–99)

## 2021-10-12 LAB — PREPARE CRYOPRECIPITATE
Unit division: 0
Unit division: 0

## 2021-10-12 LAB — BPAM CRYOPRECIPITATE
Blood Product Expiration Date: 202306251338
Blood Product Expiration Date: 202306251609
ISSUE DATE / TIME: 202306251015
ISSUE DATE / TIME: 202306251101
Unit Type and Rh: 6200
Unit Type and Rh: 6200

## 2021-10-12 LAB — DIC (DISSEMINATED INTRAVASCULAR COAGULATION)PANEL
D-Dimer, Quant: >20 ug/mL-FEU — ABNORMAL HIGH (ref 0.00–0.50)
Fibrinogen: 159 mg/dL — ABNORMAL LOW (ref 210–475)
INR: 3.2 — ABNORMAL HIGH (ref 0.8–1.2)
Platelets: 33 10*3/uL — ABNORMAL LOW (ref 150–400)
Prothrombin Time: 32.6 seconds — ABNORMAL HIGH (ref 11.4–15.2)
Smear Review: NONE SEEN
aPTT: 49 seconds — ABNORMAL HIGH (ref 24–36)

## 2021-10-12 LAB — CULTURE, BLOOD (ROUTINE X 2)
Culture: NO GROWTH
Culture: NO GROWTH
Special Requests: ADEQUATE
Special Requests: ADEQUATE

## 2021-10-12 LAB — PREPARE PLATELET PHERESIS: Unit division: 0

## 2021-10-12 MED ORDER — LACTULOSE 10 GM/15ML PO SOLN
30.0000 g | Freq: Two times a day (BID) | ORAL | Status: DC
  Administered 2021-10-12 – 2021-10-16 (×8): 30 g via ORAL
  Filled 2021-10-12 (×8): qty 45

## 2021-10-12 MED ORDER — SPIRONOLACTONE 25 MG PO TABS
50.0000 mg | ORAL_TABLET | Freq: Every day | ORAL | Status: DC
  Administered 2021-10-12 – 2021-10-15 (×4): 50 mg via ORAL
  Filled 2021-10-12 (×4): qty 2

## 2021-10-12 MED ORDER — POTASSIUM CHLORIDE 10 MEQ/50ML IV SOLN
10.0000 meq | INTRAVENOUS | Status: AC
  Administered 2021-10-12 (×4): 10 meq via INTRAVENOUS
  Filled 2021-10-12 (×4): qty 50

## 2021-10-12 MED ORDER — SALINE SPRAY 0.65 % NA SOLN
1.0000 | NASAL | Status: DC | PRN
Start: 2021-10-12 — End: 2021-10-18

## 2021-10-12 MED ORDER — LEVOTHYROXINE SODIUM 75 MCG PO TABS
37.5000 ug | ORAL_TABLET | Freq: Every day | ORAL | Status: DC
  Administered 2021-10-12 – 2021-10-16 (×5): 37.5 ug via ORAL
  Filled 2021-10-12: qty 1
  Filled 2021-10-12 (×4): qty 2
  Filled 2021-10-12: qty 1

## 2021-10-12 MED ORDER — DEXTROSE 5 % IV SOLN
10.0000 mg | Freq: Once | INTRAVENOUS | Status: AC
  Administered 2021-10-12: 10 mg via INTRAVENOUS
  Filled 2021-10-12: qty 1

## 2021-10-12 MED ORDER — DEXTROSE 10 % IV SOLN
INTRAVENOUS | Status: DC
Start: 2021-10-12 — End: 2021-10-16

## 2021-10-12 MED ORDER — SODIUM CHLORIDE 0.9% IV SOLUTION: Freq: Once | INTRAVENOUS | Status: DC

## 2021-10-12 MED ORDER — ORAL CARE MOUTH RINSE
15.0000 mL | OROMUCOSAL | Status: DC
  Administered 2021-10-12 – 2021-10-16 (×15): 15 mL via OROMUCOSAL

## 2021-10-12 MED ORDER — POTASSIUM CHLORIDE 20 MEQ PO PACK
40.0000 meq | PACK | Freq: Once | ORAL | Status: AC
  Administered 2021-10-12: 40 meq via ORAL
  Filled 2021-10-12: qty 2

## 2021-10-12 MED ORDER — ORAL CARE MOUTH RINSE: 15.0000 mL | OROMUCOSAL | Status: DC | PRN

## 2021-10-12 NOTE — Progress Notes (Signed)
eLink Physician-Brief Progress Note Patient Name: DARRICK SALVA DOB: 19-Feb-1951 MRN: [id number]   Date of Service  10/12/2021  HPI/Events of Note  Notified of Plts of 33,000 Reviewed notes, goal to maintain plt >50,000  eICU Interventions  Placed order to transfuse 1 unit plt.         Lucifer Soja M DELA CRUZ 10/12/2021, 5:05 AM

## 2021-10-12 NOTE — Progress Notes (Addendum)
NAME:  Cody Austin, MRN:  [id number], DOB:  02/10/1951, LOS: 7 ADMISSION DATE:  10/05/2021, CONSULTATION DATE:  10/05/21 REFERRING MD:  [facility], CHIEF COMPLAINT:  Hemorrhagic shock   History of Present Illness:  71 yowm NCB pt  with NASH cirrhosis  receiving paracentesis approximately every 2 weeks yielding 4.5 most recently on 09/30/21 with w/u c/w main portal vein stenosis likely secondary to chronic non-occlusive thrombus, a massive splenorenal shunt, advanced cirrhosis, hepatocellular carcinoma, hepatorenal syndrome, and hyperbilirubinemia.  He was recently admitted to [facility] with acute anemia and melena, to complicate his case further, and underwent EGD (09/23/21) which showed grade 1 esophageal varices and moderate diffuse portal hypertensive gastropathy with antral bleeding that was controlled with hemostatic spray.  He required blood transfusion during this admission.    Presented am 6/19 to [facility] ER with weakness, confusion  and low cbg/ hgb and PCCM consulted re transfer to [facility] where he is scheduled to undergo attempting portal vein angioplasty/stenting and shunt embolization on 6/30 with main complication = florid terminal liver failure per IR's note 10/01/21 and pt agreed as alternative to repeat massive paracentesis.   Pertinent  Medical History  NASh cirrhosis complicated by recurrent ascites, portal vein stenosis secondary to chronic nonocclusive thrombus, splenorenal shunt, hepatocellular carcinoma, hepatorenal syndrome, grade 1 varices, portal hypertensive gastropathy, OSA  Significant Hospital Events: Including procedures, antibiotic start and stop dates in addition to other pertinent events   6/19 Presented to [facility] and transferred [facility] 6/21 EGD with no focal source of bleed. Likely secondary to PHG. S/p hemostatic spray 6/23 S/p splenorenal shunt embolization and TIPS creation with reduction of flow by 90%. Paracentesis performed with 3L removed 6/24 Weaned off pressors.  Advanced to clear liquid diet, 6/25 Swallow Eval>> started on Dysphagia 3 , thin liquids  Interim History / Subjective:  States she is feeling better. Asking for water Off pressors and net + 13,919, on lasix twice daily  Objective   Blood pressure (!) 113/58, pulse 82, temperature 97.7 F (36.5 C), resp. rate 15, height 5\' 8"  (1.727 m), weight 105.1 kg, SpO2 91 %.        Intake/Output Summary (Last 24 hours) at 10/12/2021 1005 Last data filed at 10/12/2021 0932 Gross per 24 hour  Intake 2356.34 ml  Output 3025 ml  Net -668.66 ml   Filed Weights   10/10/21 0500 10/11/21 0500 10/12/21 0500  Weight: 107.3 kg 105.9 kg 105.1 kg   Physical Exam: General: Chronically ill-appearing, no acute distress, jaundiced, awake and con versive HENT: [place], AT, OP clear, MMM Eyes: EOMI, no scleral icterus, Scleral jaundice Respiratory: Bilateral chest excursion . Clear to auscultation bilaterally.  No crackles, wheezing or rales, diminished per bases Cardiovascular: RRR, -M/R/G, no JVD GI: BS+, distended , BS +, NT Extremities: 1-2+ pitting edema up to abdomen, improving Neuro: A&O x4, CNII-XII grossly intact Skin: Scattered ecchymosis, bruising, jaundiced Psych: Normal mood, normal affect GU: Foley in place with concentrated urine output   Labs K 3.4 T bili 12.4 from 11.2 T-bili increased AST 81 from 110 ALT 35 from 65 Cr 1.63 from 1.94 Plt 33,000 INR 3.4  DIC Panel D dimer > 20.000 Fibrinogen 159 ( L)  aPTT 49 INR 3.2 Prothombin Time 32.6  No CBC this am , will draw now  Net + 13,919 3100 cc UO last 24 hours   Resolved Hospital Problem list     Assessment & Plan:   Hemorrhagic shock/acute on chronic blood loss anemia -  Shock resolved EGD 6/21 with no focal source of bleed. Likely secondary to PHG Elevated INR - improved ?DIC --S/p PRBC x 6 FFP x 6, plt x 5, cryo pool x 1, vitamin K 10 mg x 3 since since admission --6/23 PRBC x 4, FFP x4, cryo x 1, plt x 1 given. --S/p  albumin 50g x 3 --Appreciate GI assistance. Recommend plt goal >50k and fibrinogen >120. No further GI intervention recommended as bleed is likely related to PHG --Continue PPI BID --Dc'd Octreotide 6/21 --Trend CBC, CMET, DIC --Transfused 1 unit platelets 6/26 and 10 mg vitamin K  Decompensated NASH cirrhosis with recurrent ascites s/p splenorenal shunt embolization and TIPS creation 6/24 Possible HCC - not a candidate for chemo or TACE Hepatorenal syndrome LFTs improving Followed by [facility]. Not a transplant candidate due to CADx3 Previously evaluated by IR for portal vein thrombus. Not a candidate for TIPS but considering stent Hx of paracentesis x 5 in the last two months --s/p splenorenal shunt embolization and TIPS creation 6/24 --Last paracentesis with 3L volume removal 6/24 --Diuresis>> Lasix BID --Appreciate GI input --Continue lactulose will change to po now that he is eating. Will resume  Xifaxan  now taking po's --Foley  Klebsiella pneumonia bacteremia --De-escalate to Ancef based on culture sensitivities for 10-14 day course (6/19>) --Follow-up blood cultures for clearance. 6/21  No growth final  NSTEMI in setting of anemia and shock CAD Severe aortic stenosis Trop 1610>96. Not a candidate for intervention with acute illness Echo EF preserved. Elevated RVSP in setting of liver failure  AKI on CKD IIIa - stable. Good UOP Metabolic acidosis --Monitor UOP/Cr --Avoid nephrotoxic agents  Hypoglycemia --Will change D20 to D10 at 15 cc per hour -- Start diet>> goal  to wean D10 off  Hypothyroidism --Will resume Synthroid 6/26  OSA --Nightly CPAP  Consider transfer to [facility] 10/13/2021.  Best Practice (right click and "Reselect all SmartList Selections" daily)   Diet/type: Dysphagial 3 diet with clear liquids DVT prophylaxis: not indicated bleed>> PAS hose GI prophylaxis: PPI Lines: N/A Foley:  Yes, and it is still needed Code Status:   DNR Last date of multidisciplinary goals of care discussion [6/19] DNR. Updated family on 6/23 in am  Critical care time: 40 min   The patient is critically ill with multiple organ systems failure and requires high complexity decision making for assessment and support, frequent evaluation and titration of therapies, application of advanced monitoring technologies and extensive interpretation of multiple databases.  Independent Critical Care Time: 45 Minutes.   Bevelyn Ngo, AGACNP-BC [facility] 10/12/2021 10:05 AM   Please see Amion for pager number to reach on-call Pulmonary and Critical Care Team.

## 2021-10-12 NOTE — Progress Notes (Signed)
eLink Physician-Brief Progress Note Patient Name: Cody Austin DOB: [birth year] MRN: [id number]   Date of Service  10/12/2021  HPI/Events of Note  Patient c/o nasal dryness.   eICU Interventions  Plan: Saline nasal spray 1 spray to each nostril PRN congestion or nasal dryness.      Intervention Category Major Interventions: Other:  Lenell Antu 10/12/2021, 10:49 PM

## 2021-10-12 NOTE — Progress Notes (Addendum)
Referring Physician(s): Dr. Everardo All  Supervising Physician: Marliss Coots  Patient Status:  [facility] - In-pt  Chief Complaint: NASH cirrhosis, portal vein stenosis, splenorenal shunt, hepatorenal syndrome S/p coil embolization of splenorenal shunt, portal vein angioplasty, and TIPS 6/23 by Dr. Elby Showers.   Subjective:  Sitting in bed eating lunch, spouse and daughter at bedside.  Patient reports that he is doing little bit better today, he is happy that he can eat solid food.  Denies abdominal pain, n/v.  When asked about old blood under his nostril, patient states that " it bleeds sometimes."   Allergies: Patient has no known allergies.  Medications: Prior to Admission medications   Medication Sig Start Date End Date Taking? Authorizing Provider  atorvastatin (LIPITOR) 20 MG tablet Take 20 mg by mouth at bedtime.  08/25/17  Yes [provider]  isosorbide mononitrate (IMDUR) 30 MG 24 hr tablet Take 30 mg by mouth daily. 09/22/21  Yes [provider]  lactulose (CHRONULAC) 10 GM/15ML solution Take 30 mLs (20 g total) by mouth 3 (three) times daily. Goal is 2 bowel movements daily. 07/20/21  Yes Pieter Partridge, MD  levothyroxine (SYNTHROID) 75 MCG tablet Take 0.5 tablets (37.5 mcg total) by mouth daily before breakfast. 09/22/21  Yes Rehman, Joline Maxcy, MD  metoprolol tartrate (LOPRESSOR) 25 MG tablet Take 0.5 tablets (12.5 mg total) by mouth 2 (two) times daily. 08/10/21  Yes Chandrasekhar, [name], MD  Omega-3 Fatty Acids (OMEGA 3 500 PO) Take 500 mg by mouth at bedtime.   Yes [provider]  pantoprazole (PROTONIX) 40 MG tablet Take 1 tablet (40 mg total) by mouth 2 (two) times daily before a meal. 09/24/21  Yes Memon, Durward Mallard, MD  rifaximin (XIFAXAN) 550 MG TABS tablet Take 1 tablet (550 mg total) by mouth 2 (two) times daily. Patient taking differently: Take 550 mg by mouth 2 (two) times daily. Continuously 08/12/20  Yes Rehman, Joline Maxcy, MD  zinc  gluconate 50 MG tablet Take 50 mg by mouth daily.   Yes [provider]     Vital Signs: BP (!) 98/58   Pulse 92   Temp 98.2 F (36.8 C)   Resp 19   Ht 5\' 8"  (1.727 m)   Wt 231 lb 11.3 oz (105.1 kg)   SpO2 97%   BMI 35.23 kg/m   Physical Exam Vitals reviewed.  Constitutional:      General: He is not in acute distress. HENT:     Head: Normocephalic.     Nose:     Comments: Dried, old blood under nostril.   Eyes:     Comments: Scleral icterus   Neck:     Comments: Positive dressing on R IJ puncture site. Site is unremarkable with no erythema, edema, tenderness, bleeding or drainage. Dressing clean, dry, and intact.   Cardiovascular:     Rate and Rhythm: Normal rate.  Pulmonary:     Effort: Pulmonary effort is normal.  Skin:    Coloration: Skin is jaundiced.     Comments: Positive dressing on R groin  puncture site. Site is unremarkable with no erythema, edema, tenderness, bleeding or drainage. Dressing clean, dry, and intact.    Neurological:     Mental Status: He is alert.  Psychiatric:        Mood and Affect: Mood normal.        Behavior: Behavior normal.     Imaging: IR Tips  Result Date: 10/09/2021 CLINICAL DATA:  71 year old  male with NASH cirrhosis (Child Pugh C) and severe portal hypertension exacerbated by main portal vein stenosis, likely from chronic non-occlusive thrombus, in addition to hepatofugal portal flow due to massive splenorenal shunt. Plan for TIPS approach to angioplasty portal vein with possible stent placement, followed by embolization of splenorenal shunt in hopes to establish antegrade portal flow to improve what liver function he may have left. EXAM: 1. Ultrasound-guided paracentesis 2. Ultrasound-guided access of the right internal jugular vein 3. Ultrasound-guided access of the right common femoral vein 4. Hepatic venogram 5. Intravascular ultrasound 6. Catheterization of the portal vein 7. Portal venous and central manometry 8.  Portal venogram 9. Coil embolization of splenorenal shunt 10. Angioplasty of the main portal vein 11. Creation of a transhepatic portal vein to hepatic vein shunt MEDICATIONS: As antibiotic prophylaxis, Rocephin 1 gm IV was ordered pre-procedure and administered intravenously within one hour of incision. ANESTHESIA/SEDATION: General - as administered by the Anesthesia department CONTRAST:  One hundred fifty ML Omnipaque 300, intravenous FLUOROSCOPY TIME:  Fluoroscopy Time: 54.3 minutes (1,312 mGy). COMPLICATIONS: None immediate. PROCEDURE: Informed written consent was obtained from the patient after a thorough discussion of the procedural risks, benefits and alternatives. All questions were addressed. Maximal Sterile Barrier Technique was utilized including caps, mask, sterile gowns, sterile gloves, sterile drape, hand hygiene and skin antiseptic. A timeout was performed prior to the initiation of the procedure. The right lower quadrant was prepped and draped in standard fashion. Preprocedure ultrasound evaluation demonstrated large volume ascites. A small skin nick was made. Under direct ultrasound visualization, a 10 cm, 19 gauge Yueh needle was directed into the ascitic fluid in the needle was removed. Throughout the procedure, a total of 3 L of translucent, amber fluid were removed. A preliminary ultrasound of the right groin was performed and demonstrates a patent right common femoral vein. A permanent ultrasound image was recorded. Using a combination of fluoroscopy and ultrasound, an access site was determined. A small dermatotomy was made at the planned puncture site. Using ultrasound guidance, access into the right common femoral vein was obtained with visualization of needle entry into the vessel using a standard micropuncture technique. A wire was advanced into the IVC insert all fascial dilation performed. An 8 [place], 11 cm vascular sheath was placed into the external iliac vein. Through this access  site, an 74 [place] ICE catheter was advanced with ease under fluoroscopic guidance to the level of the intrahepatic inferior vena cava. A preliminary ultrasound of the right neck was performed and demonstrates a patent internal jugular vein. A permanent ultrasound image was recorded. Using a combination of fluoroscopy and ultrasound, an access site was determined. A small dermatotomy was made at the planned puncture site. Using ultrasound guidance, access into the right internal jugular vein was obtained with visualization of needle entry into the vessel using a standard micropuncture technique. A wire was advanced into the IVC and serial fascial dilation performed. A 10 French Gore dry seal sheath was placed into the internal jugular vein and advanced to the IVC. The jugular sheath was retracted into the right atrium and manometry was performed. Right atrial mean pressure was 16 mm Hg. A 5 French angled tip catheter was then directed into the right hepatic vein. Hepatic venogram was performed. These images demonstrated patent hepatic vein with no stenosis. The catheter was advanced to a wedge portion of the a patent vein over which the 10 French sheath was advanced into the right hepatic vein. Using ICE ultrasound visualization  the catheter as right hepatic vein as well as the portal anatomy was defined. A planned exit site from the hepatic vein and puncture site from the portal vein was placed into a single sonographic plane. Under direct ultrasound visualization, the ScorpionX needle was advanced into the central right portal vein. Hand injection of contrast confirmed position within the portal system. A Glidewire Advantage was then advanced into the splenic vein. A 5 French marking pigtail catheter was then advanced over the wire into the main portal vein and wire removed. Portal venogram was performed which demonstrated complete hepatofugal flow in the portal vein, primarily through the massive  splenorenal shunt and into the inferior vena cava. Portal manometry was performed which demonstrated a mean portal pressure of 21 mm Hg. Next, a V 18 wire was inserted through the pigtail catheter which was removed. Adjacent to the safety wire, an angled tip Navicross catheter and a Glidewire Advantage were directed to the inferior vena cava and used to select the left renal vein. The catheter was exchanged for a C2 in order to select the splenorenal shunt. Venogram demonstrated brisk flow into the left renal vein which was severely dilated. Coil embolization was then performed with an assortment of 0.035 inch Azur coils. Pigtail catheter was then reinserted into the main portal/central splenic vein. Venogram was again performed which demonstrated persistent patency of the splenorenal shunt and no evidence of increased antegrade flow. Portal manometry was again performed demonstrating a mean portal pressure increased to 30 mm Hg. Therefore, intravascular ultrasound was performed to interrogate the portal vein for possible clot or stenosis. There is a focal wall adherent echogenic chronic thrombus in the mid main portal vein with significantly smaller luminal diameter than just proximal distal to the stenosis. Therefore, balloon angioplasty was performed with a 14 mm by 4 cm atlas balloon. Repeat intravascular ultrasound demonstrated improved luminal diameter with disruption of the chronic thrombus. No evidence of dissection. Repeat venogram demonstrated persistent hepatofugal flow through the portal vein and into the splenorenal shunt. Therefore, from the splenic vein approach, the tortuous portion of the splenorenal shunt near the indwelling coil pack was accessed with a Navicross catheter. An additional assortment of 0.035 inch Azur coils were placed. Repeat venogram demonstrated persistent patency. Therefore, a Progreat microcatheter was inserted and 60 cm followed by a 45 cm Penumbra packing coils were deployed.  At this point, retrograde flow to splenorenal shunt was slowed, however stool present. Several gastric veins within beginning to fill that were not previously visualized. Given these findings, in an attempt to improve antegrade flow, TIPS endograft deployment was pursued. Under direct fluoroscopic visualization, an 8-10 mm, 7+ 2 cm Viatorr endograft was deployed. Post deployment venogram demonstrated occlusion of the indwelling endograft, likely secondary to stenosis in the intraparenchymal tract. Therefore, balloon angioplasty of the stenotic area was performed with a 6 mm x 2 cm mustang balloon. Repeat portal venogram demonstrated patency of the endograft with brisk antegrade flow 1 injected from the main portal vein. Additional splenic venogram was performed after TIPS balloon molding which demonstrated persistent retrograde flow through the splenorenal shunt in indwelling coil packs. Therefore, retrograde access to the splenorenal shunt was again pursued from the left renal vein. There was an attempt to place a 7 French sheath into the splenorenal shunt in hopes of deployment of a 14 [place] Amplatzer plug. However due to tortuosity, after multiple attempts to gain stable access, cannulation was not successful. The catheters and sheath were removed and manual compression was  applied to the right internal jugular and right common femoral venous access sites until hemostasis was achieved. The patient was transferred to the PACU in stable condition. IMPRESSION: 1. Complete hepatopetal portal venous flow, the majority exiting via massive splenorenal shunt. 2. Coil embolization of the splenorenal shunt. There is still some retrograde flow through the indwelling coil packs in the splenorenal shunt upon completion, hopeful for these to continue to thrombose over time and reverse to hepatopetal flow. 3. Balloon angioplasty of the main portal vein. 4. Small TIPS placement with post deployment balloon molding to 6 mm. 5.  Ultrasound-guided paracentesis yielding 3 L ascitic fluid. Marliss Coots, MD [facility] Radiology Electronically Signed   By: Marliss Coots M.D.   On: 10/09/2021 17:02   IR US Guide Vasc Access Right  Result Date: 10/09/2021 CLINICAL DATA:  71 year old male with NASH cirrhosis (Child Pugh C) and severe portal hypertension exacerbated by main portal vein stenosis, likely from chronic non-occlusive thrombus, in addition to hepatofugal portal flow due to massive splenorenal shunt. Plan for TIPS approach to angioplasty portal vein with possible stent placement, followed by embolization of splenorenal shunt in hopes to establish antegrade portal flow to improve what liver function he may have left. EXAM: 1. Ultrasound-guided paracentesis 2. Ultrasound-guided access of the right internal jugular vein 3. Ultrasound-guided access of the right common femoral vein 4. Hepatic venogram 5. Intravascular ultrasound 6. Catheterization of the portal vein 7. Portal venous and central manometry 8. Portal venogram 9. Coil embolization of splenorenal shunt 10. Angioplasty of the main portal vein 11. Creation of a transhepatic portal vein to hepatic vein shunt MEDICATIONS: As antibiotic prophylaxis, Rocephin 1 gm IV was ordered pre-procedure and administered intravenously within one hour of incision. ANESTHESIA/SEDATION: General - as administered by the Anesthesia department CONTRAST:  One hundred fifty ML Omnipaque 300, intravenous FLUOROSCOPY TIME:  Fluoroscopy Time: 54.3 minutes (1,312 mGy). COMPLICATIONS: None immediate. PROCEDURE: Informed written consent was obtained from the patient after a thorough discussion of the procedural risks, benefits and alternatives. All questions were addressed. Maximal Sterile Barrier Technique was utilized including caps, mask, sterile gowns, sterile gloves, sterile drape, hand hygiene and skin antiseptic. A timeout was performed prior to the  initiation of the procedure. The right lower quadrant was prepped and draped in standard fashion. Preprocedure ultrasound evaluation demonstrated large volume ascites. A small skin nick was made. Under direct ultrasound visualization, a 10 cm, 19 gauge Yueh needle was directed into the ascitic fluid in the needle was removed. Throughout the procedure, a total of 3 L of translucent, amber fluid were removed. A preliminary ultrasound of the right groin was performed and demonstrates a patent right common femoral vein. A permanent ultrasound image was recorded. Using a combination of fluoroscopy and ultrasound, an access site was determined. A small dermatotomy was made at the planned puncture site. Using ultrasound guidance, access into the right common femoral vein was obtained with visualization of needle entry into the vessel using a standard micropuncture technique. A wire was advanced into the IVC insert all fascial dilation performed. An 8 [place], 11 cm vascular sheath was placed into the external iliac vein. Through this access site, an 60 [place] ICE catheter was advanced with ease under fluoroscopic guidance to the level of the intrahepatic inferior vena cava. A preliminary ultrasound of the right neck was performed and demonstrates a patent internal jugular vein. A permanent ultrasound image was recorded. Using a combination of fluoroscopy and ultrasound,  an access site was determined. A small dermatotomy was made at the planned puncture site. Using ultrasound guidance, access into the right internal jugular vein was obtained with visualization of needle entry into the vessel using a standard micropuncture technique. A wire was advanced into the IVC and serial fascial dilation performed. A 10 French Gore dry seal sheath was placed into the internal jugular vein and advanced to the IVC. The jugular sheath was retracted into the right atrium and manometry was performed. Right atrial mean pressure was 16  mm Hg. A 5 French angled tip catheter was then directed into the right hepatic vein. Hepatic venogram was performed. These images demonstrated patent hepatic vein with no stenosis. The catheter was advanced to a wedge portion of the a patent vein over which the 10 French sheath was advanced into the right hepatic vein. Using ICE ultrasound visualization the catheter as right hepatic vein as well as the portal anatomy was defined. A planned exit site from the hepatic vein and puncture site from the portal vein was placed into a single sonographic plane. Under direct ultrasound visualization, the ScorpionX needle was advanced into the central right portal vein. Hand injection of contrast confirmed position within the portal system. A Glidewire Advantage was then advanced into the splenic vein. A 5 French marking pigtail catheter was then advanced over the wire into the main portal vein and wire removed. Portal venogram was performed which demonstrated complete hepatofugal flow in the portal vein, primarily through the massive splenorenal shunt and into the inferior vena cava. Portal manometry was performed which demonstrated a mean portal pressure of 21 mm Hg. Next, a V 18 wire was inserted through the pigtail catheter which was removed. Adjacent to the safety wire, an angled tip Navicross catheter and a Glidewire Advantage were directed to the inferior vena cava and used to select the left renal vein. The catheter was exchanged for a C2 in order to select the splenorenal shunt. Venogram demonstrated brisk flow into the left renal vein which was severely dilated. Coil embolization was then performed with an assortment of 0.035 inch Azur coils. Pigtail catheter was then reinserted into the main portal/central splenic vein. Venogram was again performed which demonstrated persistent patency of the splenorenal shunt and no evidence of increased antegrade flow. Portal manometry was again performed demonstrating a mean  portal pressure increased to 30 mm Hg. Therefore, intravascular ultrasound was performed to interrogate the portal vein for possible clot or stenosis. There is a focal wall adherent echogenic chronic thrombus in the mid main portal vein with significantly smaller luminal diameter than just proximal distal to the stenosis. Therefore, balloon angioplasty was performed with a 14 mm by 4 cm atlas balloon. Repeat intravascular ultrasound demonstrated improved luminal diameter with disruption of the chronic thrombus. No evidence of dissection. Repeat venogram demonstrated persistent hepatofugal flow through the portal vein and into the splenorenal shunt. Therefore, from the splenic vein approach, the tortuous portion of the splenorenal shunt near the indwelling coil pack was accessed with a Navicross catheter. An additional assortment of 0.035 inch Azur coils were placed. Repeat venogram demonstrated persistent patency. Therefore, a Progreat microcatheter was inserted and 60 cm followed by a 45 cm Penumbra packing coils were deployed. At this point, retrograde flow to splenorenal shunt was slowed, however stool present. Several gastric veins within beginning to fill that were not previously visualized. Given these findings, in an attempt to improve antegrade flow, TIPS endograft deployment was pursued. Under direct fluoroscopic visualization, an  8-10 mm, 7+ 2 cm Viatorr endograft was deployed. Post deployment venogram demonstrated occlusion of the indwelling endograft, likely secondary to stenosis in the intraparenchymal tract. Therefore, balloon angioplasty of the stenotic area was performed with a 6 mm x 2 cm mustang balloon. Repeat portal venogram demonstrated patency of the endograft with brisk antegrade flow 1 injected from the main portal vein. Additional splenic venogram was performed after TIPS balloon molding which demonstrated persistent retrograde flow through the splenorenal shunt in indwelling coil packs.  Therefore, retrograde access to the splenorenal shunt was again pursued from the left renal vein. There was an attempt to place a 7 French sheath into the splenorenal shunt in hopes of deployment of a 14 [place] Amplatzer plug. However due to tortuosity, after multiple attempts to gain stable access, cannulation was not successful. The catheters and sheath were removed and manual compression was applied to the right internal jugular and right common femoral venous access sites until hemostasis was achieved. The patient was transferred to the PACU in stable condition. IMPRESSION: 1. Complete hepatopetal portal venous flow, the majority exiting via massive splenorenal shunt. 2. Coil embolization of the splenorenal shunt. There is still some retrograde flow through the indwelling coil packs in the splenorenal shunt upon completion, hopeful for these to continue to thrombose over time and reverse to hepatopetal flow. 3. Balloon angioplasty of the main portal vein. 4. Small TIPS placement with post deployment balloon molding to 6 mm. 5. Ultrasound-guided paracentesis yielding 3 L ascitic fluid. Marliss Coots, MD [facility] Radiology Electronically Signed   By: Marliss Coots M.D.   On: 10/09/2021 17:02   IR Angiogram Selective Each Additional Vessel  Result Date: 10/09/2021 CLINICAL DATA:  71 year old male with NASH cirrhosis (Child Pugh C) and severe portal hypertension exacerbated by main portal vein stenosis, likely from chronic non-occlusive thrombus, in addition to hepatofugal portal flow due to massive splenorenal shunt. Plan for TIPS approach to angioplasty portal vein with possible stent placement, followed by embolization of splenorenal shunt in hopes to establish antegrade portal flow to improve what liver function he may have left. EXAM: 1. Ultrasound-guided paracentesis 2. Ultrasound-guided access of the right internal jugular vein 3. Ultrasound-guided access  of the right common femoral vein 4. Hepatic venogram 5. Intravascular ultrasound 6. Catheterization of the portal vein 7. Portal venous and central manometry 8. Portal venogram 9. Coil embolization of splenorenal shunt 10. Angioplasty of the main portal vein 11. Creation of a transhepatic portal vein to hepatic vein shunt MEDICATIONS: As antibiotic prophylaxis, Rocephin 1 gm IV was ordered pre-procedure and administered intravenously within one hour of incision. ANESTHESIA/SEDATION: General - as administered by the Anesthesia department CONTRAST:  One hundred fifty ML Omnipaque 300, intravenous FLUOROSCOPY TIME:  Fluoroscopy Time: 54.3 minutes (1,312 mGy). COMPLICATIONS: None immediate. PROCEDURE: Informed written consent was obtained from the patient after a thorough discussion of the procedural risks, benefits and alternatives. All questions were addressed. Maximal Sterile Barrier Technique was utilized including caps, mask, sterile gowns, sterile gloves, sterile drape, hand hygiene and skin antiseptic. A timeout was performed prior to the initiation of the procedure. The right lower quadrant was prepped and draped in standard fashion. Preprocedure ultrasound evaluation demonstrated large volume ascites. A small skin nick was made. Under direct ultrasound visualization, a 10 cm, 19 gauge Yueh needle was directed into the ascitic fluid in the needle was removed. Throughout the procedure, a total of 3 L of translucent, amber fluid were removed. A preliminary  ultrasound of the right groin was performed and demonstrates a patent right common femoral vein. A permanent ultrasound image was recorded. Using a combination of fluoroscopy and ultrasound, an access site was determined. A small dermatotomy was made at the planned puncture site. Using ultrasound guidance, access into the right common femoral vein was obtained with visualization of needle entry into the vessel using a standard micropuncture technique. A wire was  advanced into the IVC insert all fascial dilation performed. An 8 [place], 11 cm vascular sheath was placed into the external iliac vein. Through this access site, an 77 [place] ICE catheter was advanced with ease under fluoroscopic guidance to the level of the intrahepatic inferior vena cava. A preliminary ultrasound of the right neck was performed and demonstrates a patent internal jugular vein. A permanent ultrasound image was recorded. Using a combination of fluoroscopy and ultrasound, an access site was determined. A small dermatotomy was made at the planned puncture site. Using ultrasound guidance, access into the right internal jugular vein was obtained with visualization of needle entry into the vessel using a standard micropuncture technique. A wire was advanced into the IVC and serial fascial dilation performed. A 10 French Gore dry seal sheath was placed into the internal jugular vein and advanced to the IVC. The jugular sheath was retracted into the right atrium and manometry was performed. Right atrial mean pressure was 16 mm Hg. A 5 French angled tip catheter was then directed into the right hepatic vein. Hepatic venogram was performed. These images demonstrated patent hepatic vein with no stenosis. The catheter was advanced to a wedge portion of the a patent vein over which the 10 French sheath was advanced into the right hepatic vein. Using ICE ultrasound visualization the catheter as right hepatic vein as well as the portal anatomy was defined. A planned exit site from the hepatic vein and puncture site from the portal vein was placed into a single sonographic plane. Under direct ultrasound visualization, the ScorpionX needle was advanced into the central right portal vein. Hand injection of contrast confirmed position within the portal system. A Glidewire Advantage was then advanced into the splenic vein. A 5 French marking pigtail catheter was then advanced over the wire into the main portal  vein and wire removed. Portal venogram was performed which demonstrated complete hepatofugal flow in the portal vein, primarily through the massive splenorenal shunt and into the inferior vena cava. Portal manometry was performed which demonstrated a mean portal pressure of 21 mm Hg. Next, a V 18 wire was inserted through the pigtail catheter which was removed. Adjacent to the safety wire, an angled tip Navicross catheter and a Glidewire Advantage were directed to the inferior vena cava and used to select the left renal vein. The catheter was exchanged for a C2 in order to select the splenorenal shunt. Venogram demonstrated brisk flow into the left renal vein which was severely dilated. Coil embolization was then performed with an assortment of 0.035 inch Azur coils. Pigtail catheter was then reinserted into the main portal/central splenic vein. Venogram was again performed which demonstrated persistent patency of the splenorenal shunt and no evidence of increased antegrade flow. Portal manometry was again performed demonstrating a mean portal pressure increased to 30 mm Hg. Therefore, intravascular ultrasound was performed to interrogate the portal vein for possible clot or stenosis. There is a focal wall adherent echogenic chronic thrombus in the mid main portal vein with significantly smaller luminal diameter than just proximal distal to the stenosis.  Therefore, balloon angioplasty was performed with a 14 mm by 4 cm atlas balloon. Repeat intravascular ultrasound demonstrated improved luminal diameter with disruption of the chronic thrombus. No evidence of dissection. Repeat venogram demonstrated persistent hepatofugal flow through the portal vein and into the splenorenal shunt. Therefore, from the splenic vein approach, the tortuous portion of the splenorenal shunt near the indwelling coil pack was accessed with a Navicross catheter. An additional assortment of 0.035 inch Azur coils were placed. Repeat venogram  demonstrated persistent patency. Therefore, a Progreat microcatheter was inserted and 60 cm followed by a 45 cm Penumbra packing coils were deployed. At this point, retrograde flow to splenorenal shunt was slowed, however stool present. Several gastric veins within beginning to fill that were not previously visualized. Given these findings, in an attempt to improve antegrade flow, TIPS endograft deployment was pursued. Under direct fluoroscopic visualization, an 8-10 mm, 7+ 2 cm Viatorr endograft was deployed. Post deployment venogram demonstrated occlusion of the indwelling endograft, likely secondary to stenosis in the intraparenchymal tract. Therefore, balloon angioplasty of the stenotic area was performed with a 6 mm x 2 cm mustang balloon. Repeat portal venogram demonstrated patency of the endograft with brisk antegrade flow 1 injected from the main portal vein. Additional splenic venogram was performed after TIPS balloon molding which demonstrated persistent retrograde flow through the splenorenal shunt in indwelling coil packs. Therefore, retrograde access to the splenorenal shunt was again pursued from the left renal vein. There was an attempt to place a 7 French sheath into the splenorenal shunt in hopes of deployment of a 14 [place] Amplatzer plug. However due to tortuosity, after multiple attempts to gain stable access, cannulation was not successful. The catheters and sheath were removed and manual compression was applied to the right internal jugular and right common femoral venous access sites until hemostasis was achieved. The patient was transferred to the PACU in stable condition. IMPRESSION: 1. Complete hepatopetal portal venous flow, the majority exiting via massive splenorenal shunt. 2. Coil embolization of the splenorenal shunt. There is still some retrograde flow through the indwelling coil packs in the splenorenal shunt upon completion, hopeful for these to continue to thrombose over time and  reverse to hepatopetal flow. 3. Balloon angioplasty of the main portal vein. 4. Small TIPS placement with post deployment balloon molding to 6 mm. 5. Ultrasound-guided paracentesis yielding 3 L ascitic fluid. Marliss Coots, MD [facility] Radiology Electronically Signed   By: Marliss Coots M.D.   On: 10/09/2021 17:02   IR Angiogram Selective Each Additional Vessel  Result Date: 10/09/2021 CLINICAL DATA:  71 year old male with NASH cirrhosis (Child Pugh C) and severe portal hypertension exacerbated by main portal vein stenosis, likely from chronic non-occlusive thrombus, in addition to hepatofugal portal flow due to massive splenorenal shunt. Plan for TIPS approach to angioplasty portal vein with possible stent placement, followed by embolization of splenorenal shunt in hopes to establish antegrade portal flow to improve what liver function he may have left. EXAM: 1. Ultrasound-guided paracentesis 2. Ultrasound-guided access of the right internal jugular vein 3. Ultrasound-guided access of the right common femoral vein 4. Hepatic venogram 5. Intravascular ultrasound 6. Catheterization of the portal vein 7. Portal venous and central manometry 8. Portal venogram 9. Coil embolization of splenorenal shunt 10. Angioplasty of the main portal vein 11. Creation of a transhepatic portal vein to hepatic vein shunt MEDICATIONS: As antibiotic prophylaxis, Rocephin 1 gm IV was ordered pre-procedure and administered intravenously within one hour  of incision. ANESTHESIA/SEDATION: General - as administered by the Anesthesia department CONTRAST:  One hundred fifty ML Omnipaque 300, intravenous FLUOROSCOPY TIME:  Fluoroscopy Time: 54.3 minutes (1,312 mGy). COMPLICATIONS: None immediate. PROCEDURE: Informed written consent was obtained from the patient after a thorough discussion of the procedural risks, benefits and alternatives. All questions were addressed. Maximal Sterile Barrier  Technique was utilized including caps, mask, sterile gowns, sterile gloves, sterile drape, hand hygiene and skin antiseptic. A timeout was performed prior to the initiation of the procedure. The right lower quadrant was prepped and draped in standard fashion. Preprocedure ultrasound evaluation demonstrated large volume ascites. A small skin nick was made. Under direct ultrasound visualization, a 10 cm, 19 gauge Yueh needle was directed into the ascitic fluid in the needle was removed. Throughout the procedure, a total of 3 L of translucent, amber fluid were removed. A preliminary ultrasound of the right groin was performed and demonstrates a patent right common femoral vein. A permanent ultrasound image was recorded. Using a combination of fluoroscopy and ultrasound, an access site was determined. A small dermatotomy was made at the planned puncture site. Using ultrasound guidance, access into the right common femoral vein was obtained with visualization of needle entry into the vessel using a standard micropuncture technique. A wire was advanced into the IVC insert all fascial dilation performed. An 8 [place], 11 cm vascular sheath was placed into the external iliac vein. Through this access site, an 64 [place] ICE catheter was advanced with ease under fluoroscopic guidance to the level of the intrahepatic inferior vena cava. A preliminary ultrasound of the right neck was performed and demonstrates a patent internal jugular vein. A permanent ultrasound image was recorded. Using a combination of fluoroscopy and ultrasound, an access site was determined. A small dermatotomy was made at the planned puncture site. Using ultrasound guidance, access into the right internal jugular vein was obtained with visualization of needle entry into the vessel using a standard micropuncture technique. A wire was advanced into the IVC and serial fascial dilation performed. A 10 French Gore dry seal sheath was placed into the  internal jugular vein and advanced to the IVC. The jugular sheath was retracted into the right atrium and manometry was performed. Right atrial mean pressure was 16 mm Hg. A 5 French angled tip catheter was then directed into the right hepatic vein. Hepatic venogram was performed. These images demonstrated patent hepatic vein with no stenosis. The catheter was advanced to a wedge portion of the a patent vein over which the 10 French sheath was advanced into the right hepatic vein. Using ICE ultrasound visualization the catheter as right hepatic vein as well as the portal anatomy was defined. A planned exit site from the hepatic vein and puncture site from the portal vein was placed into a single sonographic plane. Under direct ultrasound visualization, the ScorpionX needle was advanced into the central right portal vein. Hand injection of contrast confirmed position within the portal system. A Glidewire Advantage was then advanced into the splenic vein. A 5 French marking pigtail catheter was then advanced over the wire into the main portal vein and wire removed. Portal venogram was performed which demonstrated complete hepatofugal flow in the portal vein, primarily through the massive splenorenal shunt and into the inferior vena cava. Portal manometry was performed which demonstrated a mean portal pressure of 21 mm Hg. Next, a V 18 wire was inserted through the pigtail catheter which was removed. Adjacent to the safety wire, an angled  tip Navicross catheter and a Glidewire Advantage were directed to the inferior vena cava and used to select the left renal vein. The catheter was exchanged for a C2 in order to select the splenorenal shunt. Venogram demonstrated brisk flow into the left renal vein which was severely dilated. Coil embolization was then performed with an assortment of 0.035 inch Azur coils. Pigtail catheter was then reinserted into the main portal/central splenic vein. Venogram was again performed which  demonstrated persistent patency of the splenorenal shunt and no evidence of increased antegrade flow. Portal manometry was again performed demonstrating a mean portal pressure increased to 30 mm Hg. Therefore, intravascular ultrasound was performed to interrogate the portal vein for possible clot or stenosis. There is a focal wall adherent echogenic chronic thrombus in the mid main portal vein with significantly smaller luminal diameter than just proximal distal to the stenosis. Therefore, balloon angioplasty was performed with a 14 mm by 4 cm atlas balloon. Repeat intravascular ultrasound demonstrated improved luminal diameter with disruption of the chronic thrombus. No evidence of dissection. Repeat venogram demonstrated persistent hepatofugal flow through the portal vein and into the splenorenal shunt. Therefore, from the splenic vein approach, the tortuous portion of the splenorenal shunt near the indwelling coil pack was accessed with a Navicross catheter. An additional assortment of 0.035 inch Azur coils were placed. Repeat venogram demonstrated persistent patency. Therefore, a Progreat microcatheter was inserted and 60 cm followed by a 45 cm Penumbra packing coils were deployed. At this point, retrograde flow to splenorenal shunt was slowed, however stool present. Several gastric veins within beginning to fill that were not previously visualized. Given these findings, in an attempt to improve antegrade flow, TIPS endograft deployment was pursued. Under direct fluoroscopic visualization, an 8-10 mm, 7+ 2 cm Viatorr endograft was deployed. Post deployment venogram demonstrated occlusion of the indwelling endograft, likely secondary to stenosis in the intraparenchymal tract. Therefore, balloon angioplasty of the stenotic area was performed with a 6 mm x 2 cm mustang balloon. Repeat portal venogram demonstrated patency of the endograft with brisk antegrade flow 1 injected from the main portal vein. Additional  splenic venogram was performed after TIPS balloon molding which demonstrated persistent retrograde flow through the splenorenal shunt in indwelling coil packs. Therefore, retrograde access to the splenorenal shunt was again pursued from the left renal vein. There was an attempt to place a 7 French sheath into the splenorenal shunt in hopes of deployment of a 14 [place] Amplatzer plug. However due to tortuosity, after multiple attempts to gain stable access, cannulation was not successful. The catheters and sheath were removed and manual compression was applied to the right internal jugular and right common femoral venous access sites until hemostasis was achieved. The patient was transferred to the PACU in stable condition. IMPRESSION: 1. Complete hepatopetal portal venous flow, the majority exiting via massive splenorenal shunt. 2. Coil embolization of the splenorenal shunt. There is still some retrograde flow through the indwelling coil packs in the splenorenal shunt upon completion, hopeful for these to continue to thrombose over time and reverse to hepatopetal flow. 3. Balloon angioplasty of the main portal vein. 4. Small TIPS placement with post deployment balloon molding to 6 mm. 5. Ultrasound-guided paracentesis yielding 3 L ascitic fluid. Marliss Coots, MD [facility] Radiology Electronically Signed   By: Marliss Coots M.D.   On: 10/09/2021 17:02   IR Paracentesis  Result Date: 10/09/2021 CLINICAL DATA:  71 year old male with NASH cirrhosis (Child Pugh  C) and severe portal hypertension exacerbated by main portal vein stenosis, likely from chronic non-occlusive thrombus, in addition to hepatofugal portal flow due to massive splenorenal shunt. Plan for TIPS approach to angioplasty portal vein with possible stent placement, followed by embolization of splenorenal shunt in hopes to establish antegrade portal flow to improve what liver function he may have left.  EXAM: 1. Ultrasound-guided paracentesis 2. Ultrasound-guided access of the right internal jugular vein 3. Ultrasound-guided access of the right common femoral vein 4. Hepatic venogram 5. Intravascular ultrasound 6. Catheterization of the portal vein 7. Portal venous and central manometry 8. Portal venogram 9. Coil embolization of splenorenal shunt 10. Angioplasty of the main portal vein 11. Creation of a transhepatic portal vein to hepatic vein shunt MEDICATIONS: As antibiotic prophylaxis, Rocephin 1 gm IV was ordered pre-procedure and administered intravenously within one hour of incision. ANESTHESIA/SEDATION: General - as administered by the Anesthesia department CONTRAST:  One hundred fifty ML Omnipaque 300, intravenous FLUOROSCOPY TIME:  Fluoroscopy Time: 54.3 minutes (1,312 mGy). COMPLICATIONS: None immediate. PROCEDURE: Informed written consent was obtained from the patient after a thorough discussion of the procedural risks, benefits and alternatives. All questions were addressed. Maximal Sterile Barrier Technique was utilized including caps, mask, sterile gowns, sterile gloves, sterile drape, hand hygiene and skin antiseptic. A timeout was performed prior to the initiation of the procedure. The right lower quadrant was prepped and draped in standard fashion. Preprocedure ultrasound evaluation demonstrated large volume ascites. A small skin nick was made. Under direct ultrasound visualization, a 10 cm, 19 gauge Yueh needle was directed into the ascitic fluid in the needle was removed. Throughout the procedure, a total of 3 L of translucent, amber fluid were removed. A preliminary ultrasound of the right groin was performed and demonstrates a patent right common femoral vein. A permanent ultrasound image was recorded. Using a combination of fluoroscopy and ultrasound, an access site was determined. A small dermatotomy was made at the planned puncture site. Using ultrasound guidance, access into the right  common femoral vein was obtained with visualization of needle entry into the vessel using a standard micropuncture technique. A wire was advanced into the IVC insert all fascial dilation performed. An 8 [place], 11 cm vascular sheath was placed into the external iliac vein. Through this access site, an 46 [place] ICE catheter was advanced with ease under fluoroscopic guidance to the level of the intrahepatic inferior vena cava. A preliminary ultrasound of the right neck was performed and demonstrates a patent internal jugular vein. A permanent ultrasound image was recorded. Using a combination of fluoroscopy and ultrasound, an access site was determined. A small dermatotomy was made at the planned puncture site. Using ultrasound guidance, access into the right internal jugular vein was obtained with visualization of needle entry into the vessel using a standard micropuncture technique. A wire was advanced into the IVC and serial fascial dilation performed. A 10 French Gore dry seal sheath was placed into the internal jugular vein and advanced to the IVC. The jugular sheath was retracted into the right atrium and manometry was performed. Right atrial mean pressure was 16 mm Hg. A 5 French angled tip catheter was then directed into the right hepatic vein. Hepatic venogram was performed. These images demonstrated patent hepatic vein with no stenosis. The catheter was advanced to a wedge portion of the a patent vein over which the 10 French sheath was advanced into the right hepatic vein. Using ICE ultrasound visualization the catheter as right hepatic vein  as well as the portal anatomy was defined. A planned exit site from the hepatic vein and puncture site from the portal vein was placed into a single sonographic plane. Under direct ultrasound visualization, the ScorpionX needle was advanced into the central right portal vein. Hand injection of contrast confirmed position within the portal system. A Glidewire  Advantage was then advanced into the splenic vein. A 5 French marking pigtail catheter was then advanced over the wire into the main portal vein and wire removed. Portal venogram was performed which demonstrated complete hepatofugal flow in the portal vein, primarily through the massive splenorenal shunt and into the inferior vena cava. Portal manometry was performed which demonstrated a mean portal pressure of 21 mm Hg. Next, a V 18 wire was inserted through the pigtail catheter which was removed. Adjacent to the safety wire, an angled tip Navicross catheter and a Glidewire Advantage were directed to the inferior vena cava and used to select the left renal vein. The catheter was exchanged for a C2 in order to select the splenorenal shunt. Venogram demonstrated brisk flow into the left renal vein which was severely dilated. Coil embolization was then performed with an assortment of 0.035 inch Azur coils. Pigtail catheter was then reinserted into the main portal/central splenic vein. Venogram was again performed which demonstrated persistent patency of the splenorenal shunt and no evidence of increased antegrade flow. Portal manometry was again performed demonstrating a mean portal pressure increased to 30 mm Hg. Therefore, intravascular ultrasound was performed to interrogate the portal vein for possible clot or stenosis. There is a focal wall adherent echogenic chronic thrombus in the mid main portal vein with significantly smaller luminal diameter than just proximal distal to the stenosis. Therefore, balloon angioplasty was performed with a 14 mm by 4 cm atlas balloon. Repeat intravascular ultrasound demonstrated improved luminal diameter with disruption of the chronic thrombus. No evidence of dissection. Repeat venogram demonstrated persistent hepatofugal flow through the portal vein and into the splenorenal shunt. Therefore, from the splenic vein approach, the tortuous portion of the splenorenal shunt near the  indwelling coil pack was accessed with a Navicross catheter. An additional assortment of 0.035 inch Azur coils were placed. Repeat venogram demonstrated persistent patency. Therefore, a Progreat microcatheter was inserted and 60 cm followed by a 45 cm Penumbra packing coils were deployed. At this point, retrograde flow to splenorenal shunt was slowed, however stool present. Several gastric veins within beginning to fill that were not previously visualized. Given these findings, in an attempt to improve antegrade flow, TIPS endograft deployment was pursued. Under direct fluoroscopic visualization, an 8-10 mm, 7+ 2 cm Viatorr endograft was deployed. Post deployment venogram demonstrated occlusion of the indwelling endograft, likely secondary to stenosis in the intraparenchymal tract. Therefore, balloon angioplasty of the stenotic area was performed with a 6 mm x 2 cm mustang balloon. Repeat portal venogram demonstrated patency of the endograft with brisk antegrade flow 1 injected from the main portal vein. Additional splenic venogram was performed after TIPS balloon molding which demonstrated persistent retrograde flow through the splenorenal shunt in indwelling coil packs. Therefore, retrograde access to the splenorenal shunt was again pursued from the left renal vein. There was an attempt to place a 7 French sheath into the splenorenal shunt in hopes of deployment of a 14 [place] Amplatzer plug. However due to tortuosity, after multiple attempts to gain stable access, cannulation was not successful. The catheters and sheath were removed and manual compression was applied to the right internal jugular  and right common femoral venous access sites until hemostasis was achieved. The patient was transferred to the PACU in stable condition. IMPRESSION: 1. Complete hepatopetal portal venous flow, the majority exiting via massive splenorenal shunt. 2. Coil embolization of the splenorenal shunt. There is still some retrograde  flow through the indwelling coil packs in the splenorenal shunt upon completion, hopeful for these to continue to thrombose over time and reverse to hepatopetal flow. 3. Balloon angioplasty of the main portal vein. 4. Small TIPS placement with post deployment balloon molding to 6 mm. 5. Ultrasound-guided paracentesis yielding 3 L ascitic fluid. Marliss Coots, MD [facility] Radiology Electronically Signed   By: Marliss Coots M.D.   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 Plan for TIPS approach to angioplasty portal vein with possible stent placement, followed by embolization of splenorenal shunt in hopes to establish antegrade portal flow to improve what liver function he may have left. EXAM: 1. Ultrasound-guided paracentesis 2. Ultrasound-guided access of the right internal jugular vein 3. Ultrasound-guided access of the right common femoral vein 4. Hepatic venogram 5. Intravascular ultrasound 6. Catheterization of the portal vein 7. Portal venous and central manometry 8. Portal venogram 9. Coil embolization of splenorenal shunt 10. Angioplasty of the main portal vein 11. Creation of a transhepatic portal vein to hepatic vein shunt MEDICATIONS: As antibiotic prophylaxis, Rocephin 1 gm IV was ordered pre-procedure and administered intravenously within one hour of incision. ANESTHESIA/SEDATION: General - as administered by the Anesthesia department CONTRAST:  One hundred fifty ML Omnipaque 300, intravenous FLUOROSCOPY TIME:  Fluoroscopy Time: 54.3 minutes (1,312 mGy). COMPLICATIONS: None immediate. PROCEDURE: Informed written consent was obtained  from the patient after a thorough discussion of the procedural risks, benefits and alternatives. All questions were addressed. Maximal Sterile Barrier Technique was utilized including caps, mask, sterile gowns, sterile gloves, sterile drape, hand hygiene and skin antiseptic. A timeout was performed prior to the initiation of the procedure. The right lower quadrant was prepped and draped in standard fashion. Preprocedure ultrasound evaluation demonstrated large volume ascites. A small skin nick was made. Under direct ultrasound visualization, a 10 cm, 19 gauge Yueh needle was directed into the ascitic fluid in the needle was removed. Throughout the procedure, a total of 3 L of translucent, amber fluid were removed. A preliminary ultrasound of the right groin was performed and demonstrates a patent right common femoral vein. A permanent ultrasound image was recorded. Using a combination of fluoroscopy and ultrasound, an access site was determined. A small dermatotomy was made at the planned puncture site. Using ultrasound guidance, access into the right common femoral vein was obtained with visualization of needle entry into the vessel using a standard micropuncture technique. A wire was advanced into the IVC insert all fascial dilation performed. An 8 [place], 11 cm vascular sheath was placed into the external iliac vein. Through this access site, an 5 [place] ICE catheter was advanced with ease under fluoroscopic guidance to the level of the intrahepatic inferior vena cava. A preliminary ultrasound of the right neck was performed and demonstrates a patent internal jugular vein. A permanent ultrasound image was recorded. Using a combination of fluoroscopy and ultrasound, an access  site was determined. A small dermatotomy was made at the planned puncture site. Using ultrasound guidance, access into the right internal jugular vein was obtained with visualization of needle entry into the vessel using a standard  micropuncture technique. A wire was advanced into the IVC and serial fascial dilation performed. A 10 French Gore dry seal sheath was placed into the internal jugular vein and advanced to the IVC. The jugular sheath was retracted into the right atrium and manometry was performed. Right atrial mean pressure was 16 mm Hg. A 5 French angled tip catheter was then directed into the right hepatic vein. Hepatic venogram was performed. These images demonstrated patent hepatic vein with no stenosis. The catheter was advanced to a wedge portion of the a patent vein over which the 10 French sheath was advanced into the right hepatic vein. Using ICE ultrasound visualization the catheter as right hepatic vein as well as the portal anatomy was defined. A planned exit site from the hepatic vein and puncture site from the portal vein was placed into a single sonographic plane. Under direct ultrasound visualization, the ScorpionX needle was advanced into the central right portal vein. Hand injection of contrast confirmed position within the portal system. A Glidewire Advantage was then advanced into the splenic vein. A 5 French marking pigtail catheter was then advanced over the wire into the main portal vein and wire removed. Portal venogram was performed which demonstrated complete hepatofugal flow in the portal vein, primarily through the massive splenorenal shunt and into the inferior vena cava. Portal manometry was performed which demonstrated a mean portal pressure of 21 mm Hg. Next, a V 18 wire was inserted through the pigtail catheter which was removed. Adjacent to the safety wire, an angled tip Navicross catheter and a Glidewire Advantage were directed to the inferior vena cava and used to select the left renal vein. The catheter was exchanged for a C2 in order to select the splenorenal shunt. Venogram demonstrated brisk flow into the left renal vein which was severely dilated. Coil embolization was then performed with an  assortment of 0.035 inch Azur coils. Pigtail catheter was then reinserted into the main portal/central splenic vein. Venogram was again performed which demonstrated persistent patency of the splenorenal shunt and no evidence of increased antegrade flow. Portal manometry was again performed demonstrating a mean portal pressure increased to 30 mm Hg. Therefore, intravascular ultrasound was performed to interrogate the portal vein for possible clot or stenosis. There is a focal wall adherent echogenic chronic thrombus in the mid main portal vein with significantly smaller luminal diameter than just proximal distal to the stenosis. Therefore, balloon angioplasty was performed with a 14 mm by 4 cm atlas balloon. Repeat intravascular ultrasound demonstrated improved luminal diameter with disruption of the chronic thrombus. No evidence of dissection. Repeat venogram demonstrated persistent hepatofugal flow through the portal vein and into the splenorenal shunt. Therefore, from the splenic vein approach, the tortuous portion of the splenorenal shunt near the indwelling coil pack was accessed with a Navicross catheter. An additional assortment of 0.035 inch Azur coils were placed. Repeat venogram demonstrated persistent patency. Therefore, a Progreat microcatheter was inserted and 60 cm followed by a 45 cm Penumbra packing coils were deployed. At this point, retrograde flow to splenorenal shunt was slowed, however stool present. Several gastric veins within beginning to fill that were not previously visualized. Given these findings, in an attempt to improve antegrade flow, TIPS endograft deployment was pursued. Under direct fluoroscopic visualization, an 8-10 mm,  7+ 2 cm Viatorr endograft was deployed. Post deployment venogram demonstrated occlusion of the indwelling endograft, likely secondary to stenosis in the intraparenchymal tract. Therefore, balloon angioplasty of the stenotic area was performed with a 6 mm x 2 cm  mustang balloon. Repeat portal venogram demonstrated patency of the endograft with brisk antegrade flow 1 injected from the main portal vein. Additional splenic venogram was performed after TIPS balloon molding which demonstrated persistent retrograde flow through the splenorenal shunt in indwelling coil packs. Therefore, retrograde access to the splenorenal shunt was again pursued from the left renal vein. There was an attempt to place a 7 French sheath into the splenorenal shunt in hopes of deployment of a 14 [place] Amplatzer plug. However due to tortuosity, after multiple attempts to gain stable access, cannulation was not successful. The catheters and sheath were removed and manual compression was applied to the right internal jugular and right common femoral venous access sites until hemostasis was achieved. The patient was transferred to the PACU in stable condition. IMPRESSION: 1. Complete hepatopetal portal venous flow, the majority exiting via massive splenorenal shunt. 2. Coil embolization of the splenorenal shunt. There is still some retrograde flow through the indwelling coil packs in the splenorenal shunt upon completion, hopeful for these to continue to thrombose over time and reverse to hepatopetal flow. 3. Balloon angioplasty of the main portal vein. 4. Small TIPS placement with post deployment balloon molding to 6 mm. 5. Ultrasound-guided paracentesis yielding 3 L ascitic fluid. Marliss Coots, MD [facility] Electronically Signed   By: Marliss Coots M.D.   On: 10/09/2021 17:02   IR INTRAVASCULAR ULTRASOUND NON CORONARY  Result Date: 10/09/2021 CLINICAL DATA:  71 year old male with NASH cirrhosis (Child Pugh C) and severe portal hypertension exacerbated by main portal vein stenosis, likely from chronic non-occlusive thrombus, in addition to hepatofugal portal flow due to massive splenorenal shunt. Plan for TIPS approach to angioplasty  portal vein with possible stent placement, followed by embolization of splenorenal shunt in hopes to establish antegrade portal flow to improve what liver function he may have left. EXAM: 1. Ultrasound-guided paracentesis 2. Ultrasound-guided access of the right internal jugular vein 3. Ultrasound-guided access of the right common femoral vein 4. Hepatic venogram 5. Intravascular ultrasound 6. Catheterization of the portal vein 7. Portal venous and central manometry 8. Portal venogram 9. Coil embolization of splenorenal shunt 10. Angioplasty of the main portal vein 11. Creation of a transhepatic portal vein to hepatic vein shunt MEDICATIONS: As antibiotic prophylaxis, Rocephin 1 gm IV was ordered pre-procedure and administered intravenously within one hour of incision. ANESTHESIA/SEDATION: General - as administered by the Anesthesia department CONTRAST:  One hundred fifty ML Omnipaque 300, intravenous FLUOROSCOPY TIME:  Fluoroscopy Time: 54.3 minutes (1,312 mGy). COMPLICATIONS: None immediate. PROCEDURE: Informed written consent was obtained from the patient after a thorough discussion of the procedural risks, benefits and alternatives. All questions were addressed. Maximal Sterile Barrier Technique was utilized including caps, mask, sterile gowns, sterile gloves, sterile drape, hand hygiene and skin antiseptic. A timeout was performed prior to the initiation of the procedure. The right lower quadrant was prepped and draped in standard fashion. Preprocedure ultrasound evaluation demonstrated large volume ascites. A small skin nick was made. Under direct ultrasound visualization, a 10 cm, 19 gauge Yueh needle was directed into the ascitic fluid in the needle was removed. Throughout the procedure, a total of 3 L of translucent, amber fluid were removed. A preliminary ultrasound of the  right groin was performed and demonstrates a patent right common femoral vein. A permanent ultrasound image was recorded. Using a  combination of fluoroscopy and ultrasound, an access site was determined. A small dermatotomy was made at the planned puncture site. Using ultrasound guidance, access into the right common femoral vein was obtained with visualization of needle entry into the vessel using a standard micropuncture technique. A wire was advanced into the IVC insert all fascial dilation performed. An 8 [place], 11 cm vascular sheath was placed into the external iliac vein. Through this access site, an 40 [place] ICE catheter was advanced with ease under fluoroscopic guidance to the level of the intrahepatic inferior vena cava. A preliminary ultrasound of the right neck was performed and demonstrates a patent internal jugular vein. A permanent ultrasound image was recorded. Using a combination of fluoroscopy and ultrasound, an access site was determined. A small dermatotomy was made at the planned puncture site. Using ultrasound guidance, access into the right internal jugular vein was obtained with visualization of needle entry into the vessel using a standard micropuncture technique. A wire was advanced into the IVC and serial fascial dilation performed. A 10 French Gore dry seal sheath was placed into the internal jugular vein and advanced to the IVC. The jugular sheath was retracted into the right atrium and manometry was performed. Right atrial mean pressure was 16 mm Hg. A 5 French angled tip catheter was then directed into the right hepatic vein. Hepatic venogram was performed. These images demonstrated patent hepatic vein with no stenosis. The catheter was advanced to a wedge portion of the a patent vein over which the 10 French sheath was advanced into the right hepatic vein. Using ICE ultrasound visualization the catheter as right hepatic vein as well as the portal anatomy was defined. A planned exit site from the hepatic vein and puncture site from the portal vein was placed into a single sonographic plane. Under direct  ultrasound visualization, the ScorpionX needle was advanced into the central right portal vein. Hand injection of contrast confirmed position within the portal system. A Glidewire Advantage was then advanced into the splenic vein. A 5 French marking pigtail catheter was then advanced over the wire into the main portal vein and wire removed. Portal venogram was performed which demonstrated complete hepatofugal flow in the portal vein, primarily through the massive splenorenal shunt and into the inferior vena cava. Portal manometry was performed which demonstrated a mean portal pressure of 21 mm Hg. Next, a V 18 wire was inserted through the pigtail catheter which was removed. Adjacent to the safety wire, an angled tip Navicross catheter and a Glidewire Advantage were directed to the inferior vena cava and used to select the left renal vein. The catheter was exchanged for a C2 in order to select the splenorenal shunt. Venogram demonstrated brisk flow into the left renal vein which was severely dilated. Coil embolization was then performed with an assortment of 0.035 inch Azur coils. Pigtail catheter was then reinserted into the main portal/central splenic vein. Venogram was again performed which demonstrated persistent patency of the splenorenal shunt and no evidence of increased antegrade flow. Portal manometry was again performed demonstrating a mean portal pressure increased to 30 mm Hg. Therefore, intravascular ultrasound was performed to interrogate the portal vein for possible clot or stenosis. There is a focal wall adherent echogenic chronic thrombus in the mid main portal vein with significantly smaller luminal diameter than just proximal distal to the stenosis. Therefore, balloon angioplasty  was performed with a 14 mm by 4 cm atlas balloon. Repeat intravascular ultrasound demonstrated improved luminal diameter with disruption of the chronic thrombus. No evidence of dissection. Repeat venogram demonstrated  persistent hepatofugal flow through the portal vein and into the splenorenal shunt. Therefore, from the splenic vein approach, the tortuous portion of the splenorenal shunt near the indwelling coil pack was accessed with a Navicross catheter. An additional assortment of 0.035 inch Azur coils were placed. Repeat venogram demonstrated persistent patency. Therefore, a Progreat microcatheter was inserted and 60 cm followed by a 45 cm Penumbra packing coils were deployed. At this point, retrograde flow to splenorenal shunt was slowed, however stool present. Several gastric veins within beginning to fill that were not previously visualized. Given these findings, in an attempt to improve antegrade flow, TIPS endograft deployment was pursued. Under direct fluoroscopic visualization, an 8-10 mm, 7+ 2 cm Viatorr endograft was deployed. Post deployment venogram demonstrated occlusion of the indwelling endograft, likely secondary to stenosis in the intraparenchymal tract. Therefore, balloon angioplasty of the stenotic area was performed with a 6 mm x 2 cm mustang balloon. Repeat portal venogram demonstrated patency of the endograft with brisk antegrade flow 1 injected from the main portal vein. Additional splenic venogram was performed after TIPS balloon molding which demonstrated persistent retrograde flow through the splenorenal shunt in indwelling coil packs. Therefore, retrograde access to the splenorenal shunt was again pursued from the left renal vein. There was an attempt to place a 7 French sheath into the splenorenal shunt in hopes of deployment of a 14 [place] Amplatzer plug. However due to tortuosity, after multiple attempts to gain stable access, cannulation was not successful. The catheters and sheath were removed and manual compression was applied to the right internal jugular and right common femoral venous access sites until hemostasis was achieved. The patient was transferred to the PACU in stable condition.  IMPRESSION: 1. Complete hepatopetal portal venous flow, the majority exiting via massive splenorenal shunt. 2. Coil embolization of the splenorenal shunt. There is still some retrograde flow through the indwelling coil packs in the splenorenal shunt upon completion, hopeful for these to continue to thrombose over time and reverse to hepatopetal flow. 3. Balloon angioplasty of the main portal vein. 4. Small TIPS placement with post deployment balloon molding to 6 mm. 5. Ultrasound-guided paracentesis yielding 3 L ascitic fluid. Marliss Coots, MD [facility] Electronically Signed   By: Marliss Coots M.D.   On: 10/09/2021 17:02   IR INTRAVASCULAR ULTRASOUND NON CORONARY  Result Date: 10/09/2021 CLINICAL DATA:  71 year old male with NASH cirrhosis (Child Pugh C) and severe portal hypertension exacerbated by main portal vein stenosis, likely from chronic non-occlusive thrombus, in addition to hepatofugal portal flow due to massive splenorenal shunt. Plan for TIPS approach to angioplasty portal vein with possible stent placement, followed by embolization of splenorenal shunt in hopes to establish antegrade portal flow to improve what liver function he may have left. EXAM: 1. Ultrasound-guided paracentesis 2. Ultrasound-guided access of the right internal jugular vein 3. Ultrasound-guided access of the right common femoral vein 4. Hepatic venogram 5. Intravascular ultrasound 6. Catheterization of the portal vein 7. Portal venous and central manometry 8. Portal venogram 9. Coil embolization of splenorenal shunt 10. Angioplasty of the main portal vein 11. Creation of a transhepatic portal vein to hepatic vein shunt MEDICATIONS: As antibiotic prophylaxis, Rocephin 1 gm IV was ordered pre-procedure and administered intravenously within one hour of incision. ANESTHESIA/SEDATION: General -  as administered by the Anesthesia department CONTRAST:  One hundred fifty ML  Omnipaque 300, intravenous FLUOROSCOPY TIME:  Fluoroscopy Time: 54.3 minutes (1,312 mGy). COMPLICATIONS: None immediate. PROCEDURE: Informed written consent was obtained from the patient after a thorough discussion of the procedural risks, benefits and alternatives. All questions were addressed. Maximal Sterile Barrier Technique was utilized including caps, mask, sterile gowns, sterile gloves, sterile drape, hand hygiene and skin antiseptic. A timeout was performed prior to the initiation of the procedure. The right lower quadrant was prepped and draped in standard fashion. Preprocedure ultrasound evaluation demonstrated large volume ascites. A small skin nick was made. Under direct ultrasound visualization, a 10 cm, 19 gauge Yueh needle was directed into the ascitic fluid in the needle was removed. Throughout the procedure, a total of 3 L of translucent, amber fluid were removed. A preliminary ultrasound of the right groin was performed and demonstrates a patent right common femoral vein. A permanent ultrasound image was recorded. Using a combination of fluoroscopy and ultrasound, an access site was determined. A small dermatotomy was made at the planned puncture site. Using ultrasound guidance, access into the right common femoral vein was obtained with visualization of needle entry into the vessel using a standard micropuncture technique. A wire was advanced into the IVC insert all fascial dilation performed. An 8 [place], 11 cm vascular sheath was placed into the external iliac vein. Through this access site, an 53 [place] ICE catheter was advanced with ease under fluoroscopic guidance to the level of the intrahepatic inferior vena cava. A preliminary ultrasound of the right neck was performed and demonstrates a patent internal jugular vein. A permanent ultrasound image was recorded. Using a combination of fluoroscopy and ultrasound, an access site was determined. A small dermatotomy was made at the planned  puncture site. Using ultrasound guidance, access into the right internal jugular vein was obtained with visualization of needle entry into the vessel using a standard micropuncture technique. A wire was advanced into the IVC and serial fascial dilation performed. A 10 French Gore dry seal sheath was placed into the internal jugular vein and advanced to the IVC. The jugular sheath was retracted into the right atrium and manometry was performed. Right atrial mean pressure was 16 mm Hg. A 5 French angled tip catheter was then directed into the right hepatic vein. Hepatic venogram was performed. These images demonstrated patent hepatic vein with no stenosis. The catheter was advanced to a wedge portion of the a patent vein over which the 10 French sheath was advanced into the right hepatic vein. Using ICE ultrasound visualization the catheter as right hepatic vein as well as the portal anatomy was defined. A planned exit site from the hepatic vein and puncture site from the portal vein was placed into a single sonographic plane. Under direct ultrasound visualization, the ScorpionX needle was advanced into the central right portal vein. Hand injection of contrast confirmed position within the portal system. A Glidewire Advantage was then advanced into the splenic vein. A 5 French marking pigtail catheter was then advanced over the wire into the main portal vein and wire removed. Portal venogram was performed which demonstrated complete hepatofugal flow in the portal vein, primarily through the massive splenorenal shunt and into the inferior vena cava. Portal manometry was performed which demonstrated a mean portal pressure of 21 mm Hg. Next, a V 18 wire was inserted through the pigtail catheter which was removed. Adjacent to the safety wire, an angled tip Navicross catheter and a  Glidewire Advantage were directed to the inferior vena cava and used to select the left renal vein. The catheter was exchanged for a C2 in  order to select the splenorenal shunt. Venogram demonstrated brisk flow into the left renal vein which was severely dilated. Coil embolization was then performed with an assortment of 0.035 inch Azur coils. Pigtail catheter was then reinserted into the main portal/central splenic vein. Venogram was again performed which demonstrated persistent patency of the splenorenal shunt and no evidence of increased antegrade flow. Portal manometry was again performed demonstrating a mean portal pressure increased to 30 mm Hg. Therefore, intravascular ultrasound was performed to interrogate the portal vein for possible clot or stenosis. There is a focal wall adherent echogenic chronic thrombus in the mid main portal vein with significantly smaller luminal diameter than just proximal distal to the stenosis. Therefore, balloon angioplasty was performed with a 14 mm by 4 cm atlas balloon. Repeat intravascular ultrasound demonstrated improved luminal diameter with disruption of the chronic thrombus. No evidence of dissection. Repeat venogram demonstrated persistent hepatofugal flow through the portal vein and into the splenorenal shunt. Therefore, from the splenic vein approach, the tortuous portion of the splenorenal shunt near the indwelling coil pack was accessed with a Navicross catheter. An additional assortment of 0.035 inch Azur coils were placed. Repeat venogram demonstrated persistent patency. Therefore, a Progreat microcatheter was inserted and 60 cm followed by a 45 cm Penumbra packing coils were deployed. At this point, retrograde flow to splenorenal shunt was slowed, however stool present. Several gastric veins within beginning to fill that were not previously visualized. Given these findings, in an attempt to improve antegrade flow, TIPS endograft deployment was pursued. Under direct fluoroscopic visualization, an 8-10 mm, 7+ 2 cm Viatorr endograft was deployed. Post deployment venogram demonstrated occlusion of the  indwelling endograft, likely secondary to stenosis in the intraparenchymal tract. Therefore, balloon angioplasty of the stenotic area was performed with a 6 mm x 2 cm mustang balloon. Repeat portal venogram demonstrated patency of the endograft with brisk antegrade flow 1 injected from the main portal vein. Additional splenic venogram was performed after TIPS balloon molding which demonstrated persistent retrograde flow through the splenorenal shunt in indwelling coil packs. Therefore, retrograde access to the splenorenal shunt was again pursued from the left renal vein. There was an attempt to place a 7 French sheath into the splenorenal shunt in hopes of deployment of a 14 [place] Amplatzer plug. However due to tortuosity, after multiple attempts to gain stable access, cannulation was not successful. The catheters and sheath were removed and manual compression was applied to the right internal jugular and right common femoral venous access sites until hemostasis was achieved. The patient was transferred to the PACU in stable condition. IMPRESSION: 1. Complete hepatopetal portal venous flow, the majority exiting via massive splenorenal shunt. 2. Coil embolization of the splenorenal shunt. There is still some retrograde flow through the indwelling coil packs in the splenorenal shunt upon completion, hopeful for these to continue to thrombose over time and reverse to hepatopetal flow. 3. Balloon angioplasty of the main portal vein. 4. Small TIPS placement with post deployment balloon molding to 6 mm. 5. Ultrasound-guided paracentesis yielding 3 L ascitic fluid. Marliss Coots, MD [facility] Radiology Electronically Signed   By: Marliss Coots M.D.   On: 10/09/2021 17:02   IR EMBO VENOUS NOT HEMORR HEMANG  INC GUIDE ROADMAPPING  Result Date: 10/09/2021 CLINICAL DATA:  71 year old male with NASH  cirrhosis (Child Pugh C) and severe portal hypertension exacerbated by main  portal vein stenosis, likely from chronic non-occlusive thrombus, in addition to hepatofugal portal flow due to massive splenorenal shunt. Plan for TIPS approach to angioplasty portal vein with possible stent placement, followed by embolization of splenorenal shunt in hopes to establish antegrade portal flow to improve what liver function he may have left. EXAM: 1. Ultrasound-guided paracentesis 2. Ultrasound-guided access of the right internal jugular vein 3. Ultrasound-guided access of the right common femoral vein 4. Hepatic venogram 5. Intravascular ultrasound 6. Catheterization of the portal vein 7. Portal venous and central manometry 8. Portal venogram 9. Coil embolization of splenorenal shunt 10. Angioplasty of the main portal vein 11. Creation of a transhepatic portal vein to hepatic vein shunt MEDICATIONS: As antibiotic prophylaxis, Rocephin 1 gm IV was ordered pre-procedure and administered intravenously within one hour of incision. ANESTHESIA/SEDATION: General - as administered by the Anesthesia department CONTRAST:  One hundred fifty ML Omnipaque 300, intravenous FLUOROSCOPY TIME:  Fluoroscopy Time: 54.3 minutes (1,312 mGy). COMPLICATIONS: None immediate. PROCEDURE: Informed written consent was obtained from the patient after a thorough discussion of the procedural risks, benefits and alternatives. All questions were addressed. Maximal Sterile Barrier Technique was utilized including caps, mask, sterile gowns, sterile gloves, sterile drape, hand hygiene and skin antiseptic. A timeout was performed prior to the initiation of the procedure. The right lower quadrant was prepped and draped in standard fashion. Preprocedure ultrasound evaluation demonstrated large volume ascites. A small skin nick was made. Under direct ultrasound visualization, a 10 cm, 19 gauge Yueh needle was directed into the ascitic fluid in the needle was removed. Throughout the procedure, a total of 3 L of translucent, amber fluid were  removed. A preliminary ultrasound of the right groin was performed and demonstrates a patent right common femoral vein. A permanent ultrasound image was recorded. Using a combination of fluoroscopy and ultrasound, an access site was determined. A small dermatotomy was made at the planned puncture site. Using ultrasound guidance, access into the right common femoral vein was obtained with visualization of needle entry into the vessel using a standard micropuncture technique. A wire was advanced into the IVC insert all fascial dilation performed. An 8 [place], 11 cm vascular sheath was placed into the external iliac vein. Through this access site, an 47 [place] ICE catheter was advanced with ease under fluoroscopic guidance to the level of the intrahepatic inferior vena cava. A preliminary ultrasound of the right neck was performed and demonstrates a patent internal jugular vein. A permanent ultrasound image was recorded. Using a combination of fluoroscopy and ultrasound, an access site was determined. A small dermatotomy was made at the planned puncture site. Using ultrasound guidance, access into the right internal jugular vein was obtained with visualization of needle entry into the vessel using a standard micropuncture technique. A wire was advanced into the IVC and serial fascial dilation performed. A 10 French Gore dry seal sheath was placed into the internal jugular vein and advanced to the IVC. The jugular sheath was retracted into the right atrium and manometry was performed. Right atrial mean pressure was 16 mm Hg. A 5 French angled tip catheter was then directed into the right hepatic vein. Hepatic venogram was performed. These images demonstrated patent hepatic vein with no stenosis. The catheter was advanced to a wedge portion of the a patent vein over which the 10 French sheath was advanced into the right hepatic vein. Using ICE ultrasound visualization the catheter as  right hepatic vein as well as  the portal anatomy was defined. A planned exit site from the hepatic vein and puncture site from the portal vein was placed into a single sonographic plane. Under direct ultrasound visualization, the ScorpionX needle was advanced into the central right portal vein. Hand injection of contrast confirmed position within the portal system. A Glidewire Advantage was then advanced into the splenic vein. A 5 French marking pigtail catheter was then advanced over the wire into the main portal vein and wire removed. Portal venogram was performed which demonstrated complete hepatofugal flow in the portal vein, primarily through the massive splenorenal shunt and into the inferior vena cava. Portal manometry was performed which demonstrated a mean portal pressure of 21 mm Hg. Next, a V 18 wire was inserted through the pigtail catheter which was removed. Adjacent to the safety wire, an angled tip Navicross catheter and a Glidewire Advantage were directed to the inferior vena cava and used to select the left renal vein. The catheter was exchanged for a C2 in order to select the splenorenal shunt. Venogram demonstrated brisk flow into the left renal vein which was severely dilated. Coil embolization was then performed with an assortment of 0.035 inch Azur coils. Pigtail catheter was then reinserted into the main portal/central splenic vein. Venogram was again performed which demonstrated persistent patency of the splenorenal shunt and no evidence of increased antegrade flow. Portal manometry was again performed demonstrating a mean portal pressure increased to 30 mm Hg. Therefore, intravascular ultrasound was performed to interrogate the portal vein for possible clot or stenosis. There is a focal wall adherent echogenic chronic thrombus in the mid main portal vein with significantly smaller luminal diameter than just proximal distal to the stenosis. Therefore, balloon angioplasty was performed with a 14 mm by 4 cm atlas balloon.  Repeat intravascular ultrasound demonstrated improved luminal diameter with disruption of the chronic thrombus. No evidence of dissection. Repeat venogram demonstrated persistent hepatofugal flow through the portal vein and into the splenorenal shunt. Therefore, from the splenic vein approach, the tortuous portion of the splenorenal shunt near the indwelling coil pack was accessed with a Navicross catheter. An additional assortment of 0.035 inch Azur coils were placed. Repeat venogram demonstrated persistent patency. Therefore, a Progreat microcatheter was inserted and 60 cm followed by a 45 cm Penumbra packing coils were deployed. At this point, retrograde flow to splenorenal shunt was slowed, however stool present. Several gastric veins within beginning to fill that were not previously visualized. Given these findings, in an attempt to improve antegrade flow, TIPS endograft deployment was pursued. Under direct fluoroscopic visualization, an 8-10 mm, 7+ 2 cm Viatorr endograft was deployed. Post deployment venogram demonstrated occlusion of the indwelling endograft, likely secondary to stenosis in the intraparenchymal tract. Therefore, balloon angioplasty of the stenotic area was performed with a 6 mm x 2 cm mustang balloon. Repeat portal venogram demonstrated patency of the endograft with brisk antegrade flow 1 injected from the main portal vein. Additional splenic venogram was performed after TIPS balloon molding which demonstrated persistent retrograde flow through the splenorenal shunt in indwelling coil packs. Therefore, retrograde access to the splenorenal shunt was again pursued from the left renal vein. There was an attempt to place a 7 French sheath into the splenorenal shunt in hopes of deployment of a 14 [place] Amplatzer plug. However due to tortuosity, after multiple attempts to gain stable access, cannulation was not successful. The catheters and sheath were removed and manual compression was applied to  the right internal jugular and right common femoral venous access sites until hemostasis was achieved. The patient was transferred to the PACU in stable condition. IMPRESSION: 1. Complete hepatopetal portal venous flow, the majority exiting via massive splenorenal shunt. 2. Coil embolization of the splenorenal shunt. There is still some retrograde flow through the indwelling coil packs in the splenorenal shunt upon completion, hopeful for these to continue to thrombose over time and reverse to hepatopetal flow. 3. Balloon angioplasty of the main portal vein. 4. Small TIPS placement with post deployment balloon molding to 6 mm. 5. Ultrasound-guided paracentesis yielding 3 L ascitic fluid. Marliss Coots, MD [facility] Radiology Electronically Signed   By: Marliss Coots M.D.   On: 10/09/2021 17:02   DG CHEST PORT 1 VIEW  Result Date: 10/09/2021 CLINICAL DATA:  Central line placement EXAM: PORTABLE CHEST 1 VIEW COMPARISON:  Portable exam 1420 hours compared to 10/07/2021 FINDINGS: Tip of endotracheal tube projects 1.8 cm above carina. LEFT jugular central venous catheter tip projects over SVC at the level of the azygous arch. Stable heart size and mediastinal contours. Atherosclerotic calcification aorta. BILATERAL pulmonary infiltrates increased from previous exam. Probable layered RIGHT pleural effusion. No pneumothorax. IMPRESSION: Increased BILATERAL pulmonary infiltrates with probable RIGHT pleural effusion. No pneumothorax following LEFT jugular line placement. Aortic Atherosclerosis (ICD10-I70.0). Electronically Signed   By: Ulyses Southward M.D.   On: 10/09/2021 14:29    Labs:  CBC: Recent Labs    10/10/21 0334 10/10/21 1817 10/11/21 0338 10/11/21 0530 10/11/21 1800 10/12/21 0322  WBC 10.0 10.1  --  10.1 12.9*  --   HGB 7.6* 7.3*  --  7.3* 7.5*  --   HCT 22.1* 21.3*  --  21.0* 21.8*  --   PLT 43*  43* 55* 50* 48* 48*  49* 33*      COAGS: Recent Labs    10/10/21 0334 10/11/21 0338 10/11/21 1800 10/12/21 0322  INR 2.8* 3.4* 3.0* 3.2*  APTT 50* 54* 48* 49*     BMP: Recent Labs    10/09/21 0511 10/09/21 1026 10/09/21 1304 10/09/21 1535 10/10/21 0334 10/11/21 0338 10/12/21 0322  NA 139   < > 140 141 142 143 142  K 3.3*   < > 3.7 3.7 3.6 3.5 3.4*  CL 110  --  104  --  110 112* 111  CO2 21*  --   --   --  22 23 27   GLUCOSE 96  --  130*  --  129* 142* 116*  BUN 98*  --  99*  --  95* 95* 89*  CALCIUM 7.1*  --   --   --  8.5* 8.2* 8.0*  CREATININE 2.74*  --  2.60*  --  2.30* 1.94* 1.63*  GFRNONAA 24*  --   --   --  30* 36* 45*   < > = values in this interval not displayed.     LIVER FUNCTION TESTS: Recent Labs    10/09/21 0511 10/10/21 0334 10/11/21 0338 10/12/21 0322  BILITOT 9.9* 10.1* 11.2* 12.4*  AST 399* 158* 110* 81*  ALT 273* 110* 65* 35  ALKPHOS 64 54 62 68  PROT 3.5* 3.9* 3.5* 3.6*  ALBUMIN 1.9* 2.3* 2.0* 1.9*     Assessment and Plan: Large splenorenal shunt s/p coil embolization reducing splenorenal shunt by 90%, portal vein angioplasty, as well as small (6mm) TIPS creation.   Patient alert, off pressors.  Passed SLP, eating solid food today.  LFTs continue to trend down, however Tbili continued to trend up to 12.4  - fractionated bilirubin ordered to be obtained tomorrow AM  Renal function continues to improve as well  MELD-Na 34 today   IR will continue to follow.  Please assess MELD-related labs daily for now.  Electronically Signed: Willette Brace, PA-C 10/12/2021, 2:46 PM   I spent a total of 15 Minutes at the the patient's bedside AND on the patient's hospital floor or unit, greater than 50% of which was counseling/coordinating care for GI bleed, splenorenal shunt.

## 2021-10-13 ENCOUNTER — Inpatient Hospital Stay (HOSPITAL_COMMUNITY): Payer: Medicare Other

## 2021-10-13 ENCOUNTER — Other Ambulatory Visit: Payer: Self-pay

## 2021-10-13 DIAGNOSIS — K922 Gastrointestinal hemorrhage, unspecified: Secondary | ICD-10-CM | POA: Diagnosis not present

## 2021-10-13 LAB — CBC WITH DIFFERENTIAL/PLATELET
Abs Immature Granulocytes: 0.34 10*3/uL — ABNORMAL HIGH (ref 0.00–0.07)
Basophils Absolute: 0 10*3/uL (ref 0.0–0.1)
Basophils Relative: 0 %
Eosinophils Absolute: 0.1 10*3/uL (ref 0.0–0.5)
Eosinophils Relative: 1 %
HCT: 22.8 % — ABNORMAL LOW (ref 39.0–52.0)
Hemoglobin: 7.5 g/dL — ABNORMAL LOW (ref 13.0–17.0)
Immature Granulocytes: 2 %
Lymphocytes Relative: 4 %
Lymphs Abs: 0.7 10*3/uL (ref 0.7–4.0)
MCH: 32.2 pg (ref 26.0–34.0)
MCHC: 32.9 g/dL (ref 30.0–36.0)
MCV: 97.9 fL (ref 80.0–100.0)
Monocytes Absolute: 1 10*3/uL (ref 0.1–1.0)
Monocytes Relative: 6 %
Neutro Abs: 14.3 10*3/uL — ABNORMAL HIGH (ref 1.7–7.7)
Neutrophils Relative %: 87 %
Platelets: 31 10*3/uL — ABNORMAL LOW (ref 150–400)
RBC: 2.33 MIL/uL — ABNORMAL LOW (ref 4.22–5.81)
RDW: 21.9 % — ABNORMAL HIGH (ref 11.5–15.5)
Smear Review: DECREASED
WBC: 16.5 10*3/uL — ABNORMAL HIGH (ref 4.0–10.5)
nRBC: 0.2 % (ref 0.0–0.2)

## 2021-10-13 LAB — GLUCOSE, CAPILLARY
Glucose-Capillary: 104 mg/dL — ABNORMAL HIGH (ref 70–99)
Glucose-Capillary: 89 mg/dL (ref 70–99)
Glucose-Capillary: 137 mg/dL — ABNORMAL HIGH (ref 70–99)
Glucose-Capillary: 113 mg/dL — ABNORMAL HIGH (ref 70–99)
Glucose-Capillary: 91 mg/dL (ref 70–99)

## 2021-10-13 LAB — COMPREHENSIVE METABOLIC PANEL
ALT: 21 U/L (ref 0–44)
AST: 70 U/L — ABNORMAL HIGH (ref 15–41)
Albumin: 1.8 g/dL — ABNORMAL LOW (ref 3.5–5.0)
Alkaline Phosphatase: 75 U/L (ref 38–126)
Anion gap: 7 (ref 5–15)
BUN: 76 mg/dL — ABNORMAL HIGH (ref 8–23)
CO2: 27 mmol/L (ref 22–32)
Calcium: 7.9 mg/dL — ABNORMAL LOW (ref 8.9–10.3)
Chloride: 108 mmol/L (ref 98–111)
Creatinine, Ser: 1.48 mg/dL — ABNORMAL HIGH (ref 0.61–1.24)
GFR, Estimated: 50 mL/min — ABNORMAL LOW (ref >60)
Glucose, Bld: 95 mg/dL (ref 70–99)
Potassium: 3.6 mmol/L (ref 3.5–5.1)
Sodium: 142 mmol/L (ref 135–145)
Total Bilirubin: 14.8 mg/dL — ABNORMAL HIGH (ref 0.3–1.2)
Total Protein: 3.6 g/dL — ABNORMAL LOW (ref 6.5–8.1)

## 2021-10-13 LAB — BPAM PLATELET PHERESIS
Blood Product Expiration Date: 202306272359
ISSUE DATE / TIME: 202306260526
Unit Type and Rh: 6200

## 2021-10-13 LAB — PREPARE PLATELET PHERESIS: Unit division: 0

## 2021-10-13 LAB — DIC (DISSEMINATED INTRAVASCULAR COAGULATION)PANEL
D-Dimer, Quant: 19.82 ug/mL-FEU — ABNORMAL HIGH (ref 0.00–0.50)
Fibrinogen: 133 mg/dL — ABNORMAL LOW (ref 210–475)
INR: 3.2 — ABNORMAL HIGH (ref 0.8–1.2)
Platelets: 30 10*3/uL — ABNORMAL LOW (ref 150–400)
Prothrombin Time: 32.7 seconds — ABNORMAL HIGH (ref 11.4–15.2)
Smear Review: NONE SEEN
aPTT: 51 seconds — ABNORMAL HIGH (ref 24–36)

## 2021-10-13 LAB — BILIRUBIN, DIRECT: Bilirubin, Direct: 6.7 mg/dL — ABNORMAL HIGH (ref 0.0–0.2)

## 2021-10-13 LAB — MAGNESIUM: Magnesium: 1.7 mg/dL (ref 1.7–2.4)

## 2021-10-13 LAB — TROPONIN I (HIGH SENSITIVITY): Troponin I (High Sensitivity): 1589 ng/L (ref <18)

## 2021-10-13 MED ORDER — MAGNESIUM SULFATE 2 GM/50ML IV SOLN
2.0000 g | Freq: Once | INTRAVENOUS | Status: AC
Start: 2021-10-13 — End: 2021-10-13
  Administered 2021-10-13: 2 g via INTRAVENOUS
  Filled 2021-10-13: qty 50

## 2021-10-13 MED ORDER — POTASSIUM CHLORIDE CRYS ER 20 MEQ PO TBCR
40.0000 meq | EXTENDED_RELEASE_TABLET | ORAL | Status: AC
Start: 2021-10-13 — End: 2021-10-13
  Administered 2021-10-13 (×2): 40 meq via ORAL
  Filled 2021-10-13 (×2): qty 2

## 2021-10-13 MED ORDER — ALBUMIN HUMAN 25 % IV SOLN
25.0000 g | Freq: Four times a day (QID) | INTRAVENOUS | Status: AC
  Administered 2021-10-13: 25 g via INTRAVENOUS
  Filled 2021-10-13: qty 100

## 2021-10-13 MED ORDER — POTASSIUM CHLORIDE CRYS ER 20 MEQ PO TBCR
40.0000 meq | EXTENDED_RELEASE_TABLET | Freq: Once | ORAL | Status: AC
  Administered 2021-10-13: 40 meq via ORAL
  Filled 2021-10-13: qty 2

## 2021-10-13 MED ORDER — MIDODRINE HCL 5 MG PO TABS
10.0000 mg | ORAL_TABLET | Freq: Three times a day (TID) | ORAL | Status: DC
Start: 2021-10-13 — End: 2021-10-16
  Administered 2021-10-13 – 2021-10-16 (×10): 10 mg via ORAL
  Filled 2021-10-13 (×11): qty 2

## 2021-10-14 DIAGNOSIS — K922 Gastrointestinal hemorrhage, unspecified: Secondary | ICD-10-CM | POA: Diagnosis not present

## 2021-10-14 LAB — CBC WITH DIFFERENTIAL/PLATELET
Abs Immature Granulocytes: 0.43 10*3/uL — ABNORMAL HIGH (ref 0.00–0.07)
Basophils Absolute: 0 10*3/uL (ref 0.0–0.1)
Basophils Relative: 0 %
Eosinophils Absolute: 0.2 10*3/uL (ref 0.0–0.5)
Eosinophils Relative: 1 %
HCT: 21.5 % — ABNORMAL LOW (ref 39.0–52.0)
Hemoglobin: 7 g/dL — ABNORMAL LOW (ref 13.0–17.0)
Immature Granulocytes: 2 %
Lymphocytes Relative: 4 %
Lymphs Abs: 0.8 10*3/uL (ref 0.7–4.0)
MCH: 32.9 pg (ref 26.0–34.0)
MCHC: 32.6 g/dL (ref 30.0–36.0)
MCV: 100.9 fL — ABNORMAL HIGH (ref 80.0–100.0)
Monocytes Absolute: 1.4 10*3/uL — ABNORMAL HIGH (ref 0.1–1.0)
Monocytes Relative: 7 %
Neutro Abs: 18.4 10*3/uL — ABNORMAL HIGH (ref 1.7–7.7)
Neutrophils Relative %: 86 %
Platelets: 29 10*3/uL — CL (ref 150–400)
RBC: 2.13 MIL/uL — ABNORMAL LOW (ref 4.22–5.81)
RDW: 23.1 % — ABNORMAL HIGH (ref 11.5–15.5)
WBC: 21.1 10*3/uL — ABNORMAL HIGH (ref 4.0–10.5)
nRBC: 0.2 % (ref 0.0–0.2)

## 2021-10-14 LAB — COMPREHENSIVE METABOLIC PANEL
ALT: 17 U/L (ref 0–44)
AST: 67 U/L — ABNORMAL HIGH (ref 15–41)
Albumin: 1.7 g/dL — ABNORMAL LOW (ref 3.5–5.0)
Alkaline Phosphatase: 90 U/L (ref 38–126)
Anion gap: 8 (ref 5–15)
BUN: 74 mg/dL — ABNORMAL HIGH (ref 8–23)
CO2: 25 mmol/L (ref 22–32)
Calcium: 7.9 mg/dL — ABNORMAL LOW (ref 8.9–10.3)
Chloride: 108 mmol/L (ref 98–111)
Creatinine, Ser: 1.5 mg/dL — ABNORMAL HIGH (ref 0.61–1.24)
GFR, Estimated: 49 mL/min — ABNORMAL LOW (ref >60)
Glucose, Bld: 95 mg/dL (ref 70–99)
Potassium: 4.5 mmol/L (ref 3.5–5.1)
Sodium: 141 mmol/L (ref 135–145)
Total Bilirubin: 16.6 mg/dL — ABNORMAL HIGH (ref 0.3–1.2)
Total Protein: 3.6 g/dL — ABNORMAL LOW (ref 6.5–8.1)

## 2021-10-14 LAB — MAGNESIUM: Magnesium: 2 mg/dL (ref 1.7–2.4)

## 2021-10-14 LAB — PREPARE RBC (CROSSMATCH)

## 2021-10-14 LAB — GLUCOSE, CAPILLARY
Glucose-Capillary: 111 mg/dL — ABNORMAL HIGH (ref 70–99)
Glucose-Capillary: 112 mg/dL — ABNORMAL HIGH (ref 70–99)
Glucose-Capillary: 116 mg/dL — ABNORMAL HIGH (ref 70–99)
Glucose-Capillary: 70 mg/dL (ref 70–99)
Glucose-Capillary: 86 mg/dL (ref 70–99)
Glucose-Capillary: 90 mg/dL (ref 70–99)

## 2021-10-14 LAB — TROPONIN I (HIGH SENSITIVITY)
Troponin I (High Sensitivity): 1658 ng/L (ref <18)
Troponin I (High Sensitivity): 1355 ng/L (ref <18)

## 2021-10-14 MED ORDER — METOLAZONE 2.5 MG PO TABS
5.0000 mg | ORAL_TABLET | Freq: Once | ORAL | Status: AC
Start: 2021-10-14 — End: 2021-10-14
  Administered 2021-10-14: 5 mg via ORAL
  Filled 2021-10-14: qty 2

## 2021-10-14 MED ORDER — FUROSEMIDE 10 MG/ML IJ SOLN
40.0000 mg | Freq: Three times a day (TID) | INTRAMUSCULAR | Status: DC
  Administered 2021-10-14 – 2021-10-16 (×7): 40 mg via INTRAVENOUS
  Filled 2021-10-14 (×7): qty 4

## 2021-10-14 MED ORDER — SODIUM CHLORIDE 0.9% IV SOLUTION: Freq: Once | INTRAVENOUS | Status: AC

## 2021-10-14 NOTE — Evaluation (Signed)
Occupational Therapy Evaluation Patient Details Name: Cody Austin MRN: [id number] DOB: 1951-03-30 Today's Date: 10/14/2021   History of Present Illness pt is a 71 y/o male presenting 6/19 as a transfer from [facility] ED with hemorrhagic shock-- weakness, confusion and low CBG/Hgb, where he is scheduled to undergo attempted portal vein angioplasty/stenting and shunt embolization on 6/30.  6/21 EGD with no focal source of bleed/s/p homostatic spray, 6/23 splenorenal shunt emboliztion and TIPS creation, Paracentesis with 3L removed.  6/24 off pressors, 6/25 passed swallow eval.  PMH: NASH, thrombocytopenia, diffuse coronary disease, turndown for liver transplant due to coronary disease, not a candidate for CABG or intervention due to complications of [name] (thrombocytopenia) and currently on medical therapy alone despite recent non-ST elevation MI.   Clinical Impression   Pt admitted for concerns listed above. PTA pt reported that he was fairly independent with all ADL's and IADL's. At this time, pt presents with increased weakness, fear of mobility, balance deficits, cognitive concerns, and decreased activity tolerance. He is requiring max A +2 for all bed mobility and was unable to fully come up to sitting due to fear of falling. Recommending SNF at this time to maximize independence and OT will follow acutely.       Recommendations for follow up therapy are one component of a multi-disciplinary discharge planning process, led by the attending physician.  Recommendations may be updated based on patient status, additional functional criteria and insurance authorization.   Follow Up Recommendations  Skilled nursing-short term rehab (<3 hours/day)    Assistance Recommended at Discharge Frequent or constant Supervision/Assistance  Patient can return home with the following Two people to help with walking and/or transfers;Two people to help with bathing/dressing/bathroom;Assistance with  cooking/housework;Direct supervision/assist for medications management;Direct supervision/assist for financial management;Assist for transportation;Help with stairs or ramp for entrance    Functional Status Assessment  Patient has had a recent decline in their functional status and demonstrates the ability to make significant improvements in function in a reasonable and predictable amount of time.  Equipment Recommendations  Other (comment) (TBD)    Recommendations for Other Services       Precautions / Restrictions Precautions Precautions: Fall Restrictions Weight Bearing Restrictions: No      Mobility Bed Mobility Overal bed mobility: Needs Assistance Bed Mobility: Rolling, Sidelying to Sit, Sit to Supine Rolling: Max assist, +2 for physical assistance Sidelying to sit: Max assist, +2 for physical assistance   Sit to supine: Max assist, +2 for physical assistance   General bed mobility comments: Attempting to come tositting, BLE off the EOB and pt panicked and flung himself backwards, refusing further mobility.    Transfers                          Balance Overall balance assessment: Needs assistance     Sitting balance - Comments: Did not make it to full upright sitting                                   ADL either performed or assessed with clinical judgement   ADL Overall ADL's : Needs assistance/impaired Eating/Feeding: Moderate assistance;Bed level   Grooming: Moderate assistance;Bed level   Upper Body Bathing: Moderate assistance;Bed level   Lower Body Bathing: Maximal assistance;+2 for safety/equipment;+2 for physical assistance;Bed level   Upper Body Dressing : Moderate assistance;Bed level   Lower Body Dressing: Maximal assistance;+2 for  safety/equipment;+2 for physical assistance;Bed level       Toileting- Clothing Manipulation and Hygiene: Maximal assistance;+2 for physical assistance;+2 for safety/equipment;Bed level          General ADL Comments: Pt is limited due to weakness and decreased endurance, as well as displaying self limiting behaviors.     Vision Baseline Vision/History: 1 Wears glasses Ability to See in Adequate Light: 0 Adequate Patient Visual Report: No change from baseline Vision Assessment?: No apparent visual deficits     Perception     Praxis      Pertinent Vitals/Pain Pain Assessment Pain Assessment: Faces Faces Pain Scale: Hurts little more Pain Location: generalized with movement Pain Descriptors / Indicators: Discomfort, Grimacing Pain Intervention(s): Limited activity within patient's tolerance, Monitored during session, Repositioned     Hand Dominance Right   Extremity/Trunk Assessment Upper Extremity Assessment Upper Extremity Assessment: Generalized weakness (significant edema and weeping)   Lower Extremity Assessment Lower Extremity Assessment: Generalized weakness   Cervical / Trunk Assessment Cervical / Trunk Assessment: Other exceptions Cervical / Trunk Exceptions: significant truncal edema   Communication Communication Communication: HOH   Cognition Arousal/Alertness: Awake/alert Behavior During Therapy: WFL for tasks assessed/performed Overall Cognitive Status: No family/caregiver present to determine baseline cognitive functioning                                       General Comments  VSS on 2L, profound weeping on BUE and some on BLE    Exercises     Shoulder Instructions      Home Living Family/patient expects to be discharged to:: Private residence Living Arrangements: Spouse/significant other Available Help at Discharge: Family;Available 24 hours/day Type of Home: House Home Access: Stairs to enter [employer] of Steps: 3 Entrance Stairs-Rails: None Home Layout: Two level;Able to live on main level with bedroom/bathroom     Bathroom Shower/Tub: Occupational psychologist: Handicapped  height Bathroom Accessibility: No   Home Equipment: Thayer - single point;Tub bench          Prior Functioning/Environment Prior Level of Function : Independent/Modified Independent;Driving             Mobility Comments: endurance is limited ADLs Comments: indep        OT Problem List: Decreased strength;Decreased activity tolerance;Impaired balance (sitting and/or standing);Decreased cognition;Decreased safety awareness;Decreased knowledge of use of DME or AE;Cardiopulmonary status limiting activity;Impaired sensation;Impaired tone;Impaired UE functional use;Increased edema      OT Treatment/Interventions: Self-care/ADL training;Therapeutic exercise;Energy conservation;DME and/or AE instruction;Therapeutic activities;Cognitive remediation/compensation;Patient/family education;Balance training    OT Goals(Current goals can be found in the care plan section) Acute Rehab OT Goals Patient Stated Goal: To get stronger OT Goal Formulation: With patient Time For Goal Achievement: 10/28/21 Potential to Achieve Goals: Good ADL Goals Pt Will Perform Grooming: with set-up;sitting Pt Will Perform Lower Body Bathing: with mod assist;sitting/lateral leans;sit to/from stand Pt Will Perform Lower Body Dressing: with mod assist;sitting/lateral leans;sit to/from stand Pt Will Transfer to Toilet: with mod assist;ambulating Pt Will Perform Toileting - Clothing Manipulation and hygiene: with mod assist;sitting/lateral leans;sit to/from stand Additional ADL Goal #1: Pt will complete bed mobility with min A as a precursor for seated ADL's.  OT Frequency: Min 2X/week    Co-evaluation              AM-PAC OT "6 Clicks" Daily Activity     Outcome Measure Help from another person  eating meals?: A Lot Help from another person taking care of personal grooming?: A Lot Help from another person toileting, which includes using toliet, bedpan, or urinal?: Total Help from another person bathing  (including washing, rinsing, drying)?: A Lot Help from another person to put on and taking off regular upper body clothing?: A Lot Help from another person to put on and taking off regular lower body clothing?: Total 6 Click Score: 10   End of Session Equipment Utilized During Treatment: Oxygen Nurse Communication: Mobility status  Activity Tolerance: Patient tolerated treatment well Patient left: in bed;with call bell/phone within reach  OT Visit Diagnosis: Unsteadiness on feet (R26.81);Other abnormalities of gait and mobility (R26.89);Muscle weakness (generalized) (M62.81)                Time: 1470-9295 OT Time Calculation (min): 41 min Charges:  OT General Charges $OT Visit: 1 Visit OT Evaluation $OT Eval Moderate Complexity: 1 Mod OT Treatments $Self Care/Home Management : 8-22 mins $Therapeutic Activity: 8-22 mins  Dequavious Harshberger H., OTR/L Acute Rehabilitation  Kendyn Zaman Elane Yolanda Bonine 10/14/2021, 5:27 PM

## 2021-10-14 NOTE — Progress Notes (Addendum)
NAME:  Cody Austin, MRN:  [id number], DOB:  [birth year], LOS: 45 ADMISSION DATE:  10/05/2021, CONSULTATION DATE:  10/05/21 REFERRING MD:  [facility], CHIEF COMPLAINT:  Hemorrhagic shock   History of Present Illness:  71 yowm NCB pt  with NASH cirrhosis  receiving paracentesis approximately every 2 weeks yielding 4.5 most recently on 09/30/21 with w/u c/w main portal vein stenosis likely secondary to chronic non-occlusive thrombus, a massive splenorenal shunt, advanced cirrhosis, hepatocellular carcinoma, hepatorenal syndrome, and hyperbilirubinemia.  He was recently admitted to [facility] with acute anemia and melena, to complicate his case further, and underwent EGD (09/23/21) which showed grade 1 esophageal varices and moderate diffuse portal hypertensive gastropathy with antral bleeding that was controlled with hemostatic spray.  He required blood transfusion during this admission.    Presented am 6/19 to [facility] ER with weakness, confusion  and low cbg/ hgb and PCCM consulted re transfer to [facility] where he is scheduled to undergo attempting portal vein angioplasty/stenting and shunt embolization on 1/19 with main complication = florid terminal liver failure per IR's note 10/01/21 and pt agreed as alternative to repeat massive paracentesis.   Pertinent  Medical History  NASh cirrhosis complicated by recurrent ascites, portal vein stenosis secondary to chronic nonocclusive thrombus, splenorenal shunt, hepatocellular carcinoma, hepatorenal syndrome, grade 1 varices, portal hypertensive gastropathy, OSA  Significant Hospital Events: Including procedures, antibiotic start and stop dates in addition to other pertinent events   6/19 Presented to [facility] and transferred [facility] 6/21 EGD with no focal source of bleed. Likely secondary to [place]. S/p hemostatic spray 6/23 S/p splenorenal shunt embolization and TIPS creation with reduction of flow by 90%. Paracentesis performed with 3L removed 6/24 Weaned off pressors.  Advanced to clear liquid diet, 6/25 Swallow Eval>> started on Dysphagia 3 , thin liquids  Interim History / Subjective:  No events. Flat trops and no chest pain with albumin administration.  Objective   Blood pressure 134/62, pulse 83, temperature 98.1 F (36.7 C), temperature source Bladder, resp. rate 20, height 5' 8"  (1.727 m), weight 106.4 kg, SpO2 98 %.        Intake/Output Summary (Last 24 hours) at 10/14/2021 0946 Last data filed at 10/14/2021 0900 Gross per 24 hour  Intake 867.23 ml  Output 1720 ml  Net -852.77 ml    Filed Weights   10/12/21 0500 10/13/21 0500 10/14/21 0500  Weight: 105.1 kg 107.5 kg 106.4 kg   Physical Exam: Ill appearing jaundiced man Harsh murmur, ext warm Global anasarca Aox3, moves all 4 ext, globally weak Bruising on all ext and skin tears  Cr okay K okay Plts stable H/h down slightly  Resolved Hospital Problem list     Assessment & Plan:   Hemorrhagic shock/acute on chronic blood loss anemia - Shock resolved EGD 6/21 with no focal source of bleed. Likely secondary to [place] Elevated INR - improved End stage NASH cirrhosis with recurrent ascites s/p splenorenal shunt embolization and TIPS creation 6/24- not transplant candidate due to CAD hx, followed by [facility] GI Coagulopathy and thrombocytopenia- more related now to end stage cirrhosis rather than blood loss Possible HCC - not a candidate for chemo or TACE Hepatorenal syndrome Volume overload Klebsiella pneumonia bacteremia- on 10 day course of ancef NSTEMI in setting of anemia and shock CAD- not intervention candidate Severe aortic stenosis- not intervention candidate Hypoglycemia Dysphagia  - Continue dysphagia diet - Getting 1 unit pRBC today - Push diuresis, add dose of metolazone - Continue D10 until taking  enough PO - Hoping palliative can establish relationship with family and guide them through this difficult time as patient is at EOL - PT/OT consults  pending  Transfer to progressive, appreciate [facility] taking over starting 10/15/21; RN to update wife and daughter  Erskine Emery MD PCCM

## 2021-10-14 NOTE — Evaluation (Signed)
Physical Therapy Evaluation Patient Details Name: Cody Austin MRN: [id number] DOB: 05/04/1950 Today's Date: 10/14/2021  History of Present Illness  pt is a 71 y/o male presenting 6/19 as a transfer from [facility] ED with hemorrhagic shock-- weakness, confusion and low CBG/Hgb, where he is scheduled to undergo attempted portal vein angioplasty/stenting and shunt embolization on 6/30.  6/21 EGD with no focal source of bleed/s/p homostatic spray, 6/23 splenorenal shunt emboliztion and TIPS creation, Paracentesis with 3L removed.  6/24 off pressors, 6/25 passed swallow eval.  PMH: NASH, thrombocytopenia, diffuse coronary disease, turndown for liver transplant due to coronary disease, not a candidate for CABG or intervention due to complications of [name] (thrombocytopenia) and currently on medical therapy alone despite recent non-ST elevation MI.  Clinical Impression  Pt admitted with/for hemorrhagic shock with weakness, confusion and low CBG/Hgb.  Pt very edematous limiting his mobility, but despite this, pt mobilized with moderate assist for bed mobility, [facility] and [facility] with standing at EOB/BSC for peri care..  Pt currently limited functionally due to the problems listed. ( See problems list.)   Pt will benefit from PT to maximize function and safety in order to get ready for next venue listed below.        Recommendations for follow up therapy are one component of a multi-disciplinary discharge planning process, led by the attending physician.  Recommendations may be updated based on patient status, additional functional criteria and insurance authorization.  Follow Up Recommendations Skilled nursing-short term rehab (<3 hours/day) Can patient physically be transported by private vehicle: No (not yet, likely by d/c it won't be a problem)    Assistance Recommended at Discharge Frequent or constant Supervision/Assistance  Patient can return home with the following  A little help with  walking and/or transfers;A little help with bathing/dressing/bathroom;Assistance with cooking/housework;Assist for transportation;Help with stairs or ramp for entrance    Equipment Recommendations Rolling walker (2 wheels) (TBD)  Recommendations for Other Services       Functional Status Assessment Patient has had a recent decline in their functional status and demonstrates the ability to make significant improvements in function in a reasonable and predictable amount of time.     Precautions / Restrictions Precautions Precautions: Fall      Mobility  Bed Mobility Overal bed mobility: Needs Assistance Bed Mobility: Rolling, Sidelying to Sit, Sit to Supine Rolling: Mod assist, +2 for physical assistance Sidelying to sit: Max assist, +2 for physical assistance   Sit to supine: Max assist, +2 for physical assistance   General bed mobility comments: cues for sequencing, assist for direction, truncal and LE assist to roll for extensive peri-care for loose watery stool.    Transfers Overall transfer level: Needs assistance Equipment used:  (chair back) Transfers: Sit to/from Stand, Bed to chair/wheelchair/BSC Sit to Stand: Mod assist, +2 physical assistance, From elevated surface Stand pivot transfers: Mod assist, +2 physical assistance, +2 safety/equipment         General transfer comment: cues for hand placement, assist forward and with boost, stability assist in standing during peri care and during pivotal step transfers bed to [facility] to bed.    Ambulation/Gait                  Stairs            Wheelchair Mobility    Modified Rankin (Stroke Patients Only)       Balance Overall balance assessment: Needs assistance Sitting-balance support: Bilateral upper extremity supported, No  upper extremity supported Sitting balance-Leahy Scale: Fair     Standing balance support: Bilateral upper extremity supported, Single extremity supported, During functional  activity Standing balance-Leahy Scale: Poor Standing balance comment: reliant on external support or hand to stationary surface.                             Pertinent Vitals/Pain Pain Assessment Pain Assessment: Faces Faces Pain Scale: Hurts little more Pain Location: generalized with movement Pain Descriptors / Indicators: Discomfort, Grimacing Pain Intervention(s): Limited activity within patient's tolerance, Monitored during session    Home Living Family/patient expects to be discharged to:: Private residence Living Arrangements: Spouse/significant other Available Help at Discharge: Family;Available 24 hours/day Type of Home: House Home Access: Stairs to enter Entrance Stairs-Rails: None Entrance Stairs-Number of Steps: 3   Home Layout: Two level;Able to live on main level with bedroom/bathroom Home Equipment: Kasandra Knudsen - single point;Tub bench      Prior Function Prior Level of Function : Independent/Modified Independent;Driving             Mobility Comments: endurance is limited ADLs Comments: indep     Hand Dominance   Dominant Hand: Right    Extremity/Trunk Assessment   Upper Extremity Assessment Upper Extremity Assessment: Defer to OT evaluation;Generalized weakness (bil weeping edema)    Lower Extremity Assessment Lower Extremity Assessment: Generalized weakness (bil LE's with weeping edema, perineal/scrotal edema)    Cervical / Trunk Assessment Cervical / Trunk Assessment:  (significant truncal edema)  Communication   Communication: HOH  Cognition Arousal/Alertness: Awake/alert Behavior During Therapy: WFL for tasks assessed/performed Overall Cognitive Status: No family/caregiver present to determine baseline cognitive functioning                                          General Comments General comments (skin integrity, edema, etc.): profoundly weeping skin UE's> LE's/trunk, VSS    Exercises     Assessment/Plan     PT Assessment Patient needs continued PT services  PT Problem List Decreased strength;Decreased activity tolerance;Decreased range of motion;Decreased balance;Decreased mobility;Decreased knowledge of use of DME;Cardiopulmonary status limiting activity;Decreased skin integrity       PT Treatment Interventions DME instruction;Gait training;Functional mobility training;Therapeutic activities;Balance training;Patient/family education    PT Goals (Current goals can be found in the Care Plan section)  Acute Rehab PT Goals Patient Stated Goal: not stated by pt PT Goal Formulation: Patient unable to participate in goal setting Time For Goal Achievement: 10/28/21 Potential to Achieve Goals: Good    Frequency Min 3X/week     Co-evaluation               AM-PAC PT "6 Clicks" Mobility  Outcome Measure Help needed turning from your back to your side while in a flat bed without using bedrails?: A Lot Help needed moving from lying on your back to sitting on the side of a flat bed without using bedrails?: Total Help needed moving to and from a bed to a chair (including a wheelchair)?: Total Help needed standing up from a chair using your arms (e.g., wheelchair or bedside chair)?: A Lot Help needed to walk in hospital room?: A Lot Help needed climbing 3-5 steps with a railing? : Total 6 Click Score: 9    End of Session Equipment Utilized During Treatment: Oxygen Activity Tolerance: Patient tolerated treatment well;Patient limited by  fatigue Patient left: in bed;with call bell/phone within reach;with nursing/sitter in room Nurse Communication: Mobility status PT Visit Diagnosis: Other abnormalities of gait and mobility (R26.89);Muscle weakness (generalized) (M62.81)    Time: 1518-1600 PT Time Calculation (min) (ACUTE ONLY): 42 min   Charges:   PT Evaluation $PT Eval Moderate Complexity: 1 Mod PT Treatments $Therapeutic Activity: 23-37 mins        10/14/2021  Ginger Carne., PT Acute  Rehabilitation Services [phone]  (pager) [phone]  (office)  Tessie Fass Eythan Jayne 10/14/2021, 5:03 PM

## 2021-10-14 NOTE — Progress Notes (Signed)
Spoke with daughter and wife at bedside and updated on patient. Let them know patient is being transferred out of ICU. Daughter wanted [place] to make sure we communicated that albumin causes patient to have heart attack symptoms when given too frequently. She states that he tolerates it better when given greater than 10 hour frequency. Pharmacy informed and details added in allergy list.  JD Rexene Agent [facility] 10/14/2021, 2:08 PM  Please see [email or web address] for pager details.  From 7A-7P if no response, please call [phone]. After hours, please call ELink [phone].

## 2021-10-14 NOTE — Progress Notes (Signed)
Referring Physician(s): Dr. Loanne Drilling  Supervising Physician: Dr. Vernard Gambles  Patient Status:  [facility] - In-pt  Chief Complaint: NASH cirrhosis, portal vein stenosis, splenorenal shunt, hepatorenal syndrome S/p coil embolization of splenorenal shunt, portal vein angioplasty, and TIPS 6/23 by Dr. Serafina Royals.   Subjective:  Sitting in bed, spouse and daughter at bedside. In and out of sleep. Denies abdominal pain, n/v.   Allergies: Albumin (human)  Medications: Prior to Admission medications   Medication Sig Start Date End Date Taking? Authorizing Provider  atorvastatin (LIPITOR) 20 MG tablet Take 20 mg by mouth at bedtime.  08/25/17  Yes [provider]  isosorbide mononitrate (IMDUR) 30 MG 24 hr tablet Take 30 mg by mouth daily. 09/22/21  Yes [provider]  lactulose (CHRONULAC) 10 GM/15ML solution Take 30 mLs (20 g total) by mouth 3 (three) times daily. Goal is 2 bowel movements daily. 07/20/21  Yes [name], MD  levothyroxine (SYNTHROID) 75 MCG tablet Take 0.5 tablets (37.5 mcg total) by mouth daily before breakfast. 09/22/21  Yes Rehman, Mechele Dawley, MD  metoprolol tartrate (LOPRESSOR) 25 MG tablet Take 0.5 tablets (12.5 mg total) by mouth 2 (two) times daily. 08/10/21  Yes Chandrasekhar, [name], MD  Omega-3 Fatty Acids (OMEGA 3 500 PO) Take 500 mg by mouth at bedtime.   Yes [provider]  pantoprazole (PROTONIX) 40 MG tablet Take 1 tablet (40 mg total) by mouth 2 (two) times daily before a meal. 09/24/21  Yes Memon, Jolaine Artist, MD  rifaximin (XIFAXAN) 550 MG TABS tablet Take 1 tablet (550 mg total) by mouth 2 (two) times daily. Patient taking differently: Take 550 mg by mouth 2 (two) times daily. Continuously 08/12/20  Yes Rehman, Mechele Dawley, MD  zinc gluconate 50 MG tablet Take 50 mg by mouth daily.   Yes [provider]     Vital Signs: BP (!) 113/100 (BP Location: Right Arm)   Pulse 78   Temp 97.9 F (36.6 C)   Resp 17   Ht 5' 8"  (1.727  m)   Wt 234 lb 9.1 oz (106.4 kg)   SpO2 100%   BMI 35.67 kg/m   Physical Exam Vitals reviewed.  Constitutional:      General: He is not in acute distress.    Appearance: He is ill-appearing.  HENT:     Head: Normocephalic.     Nose:     Comments: Dried, old blood under nostril.   Eyes:     Comments: Scleral icterus   Neck:     Comments: Positive dressing on R IJ puncture site. Site is unremarkable with no erythema, edema, tenderness, bleeding or drainage. Dressing clean, dry, and intact.   Cardiovascular:     Rate and Rhythm: Normal rate.  Pulmonary:     Effort: Pulmonary effort is normal.  Skin:    Coloration: Skin is jaundiced.     Findings: Bruising present.     Comments:    Neurological:     Mental Status: He is alert.  Psychiatric:        Mood and Affect: Mood normal.        Behavior: Behavior normal.     Imaging: DG CHEST PORT 1 VIEW  Result Date: 10/13/2021 CLINICAL DATA:  Respiratory abnormality. Re-evaluate lung status post extubation. EXAM: PORTABLE CHEST 1 VIEW COMPARISON:  10/09/2021 portable chest. FINDINGS: 4:12 a.m., 10/13/2021. Left IJ central line has its tip in the distal SVC. This is unchanged. Interval extubation. Small to moderate right and  small left pleural effusions are again noted and both are improved. Patchy consolidation continues to be seen in the right mid and lower lung field and scattered hazy opacities in the left base, but bilateral opacities previously in the right-greater-than-left upper to mid lung fields have largely cleared on today's exam, and the left basilar opacities are less dense and much less confluent. No new or worsening abnormality is seen. Cardiac size is stable and the central vessels are normal caliber. There is aortic atherosclerosis. Thoracic spondylosis. IMPRESSION: Improving lung opacities and bilateral pleural effusions with right-greater-than-left effusions with lung opacities remaining as described above. Interval  extubation. Electronically Signed   By: Telford Nab M.D.   On: 10/13/2021 07:16    Labs:  CBC: Recent Labs    10/11/21 0530 10/11/21 1800 10/12/21 0322 10/13/21 0500 10/14/21 0340  WBC 10.1 12.9*  --  16.5* 21.1*  HGB 7.3* 7.5*  --  7.5* 7.0*  HCT 21.0* 21.8*  --  22.8* 21.5*  PLT 48* 48*  49* 33* 30*  31* 29*     COAGS: Recent Labs    10/11/21 0338 10/11/21 1800 10/12/21 0322 10/13/21 0500  INR 3.4* 3.0* 3.2* 3.2*  APTT 54* 48* 49* 51*     BMP: Recent Labs    10/12/21 0322 10/12/21 2137 10/13/21 0500 10/14/21 0117  NA 142 139 142 141  K 3.4* 3.4* 3.6 4.5  CL 111 106 108 108  CO2 27 26 27 25   GLUCOSE 116* 116* 95 95  BUN 89* 80* 76* 74*  CALCIUM 8.0* 7.7* 7.9* 7.9*  CREATININE 1.63* 1.42* 1.48* 1.50*  GFRNONAA 45* 53* 50* 49*     LIVER FUNCTION TESTS: Recent Labs    10/12/21 0322 10/12/21 2137 10/13/21 0500 10/14/21 0117  BILITOT 12.4* 13.2* 14.8* 16.6*  AST 81* 72* 70* 67*  ALT 35 23 21 17   ALKPHOS 68 77 75 90  PROT 3.6* 3.5* 3.6* 3.6*  ALBUMIN 1.9* 1.8* 1.8* 1.7*     Assessment and Plan: Large splenorenal shunt s/p coil embolization reducing splenorenal shunt by 90%, portal vein angioplasty, as well as small (66m) TIPS creation.   Patient drinking liquids this afternoon.  Had breakfast today.  Discussion had with family regarding ongoing support in and out of the hospital.    Tbili continued to trend up to 16.6, Direct Bili 6.7 Creatinine 1.5 today MELD-Na remains 34   IR will continue to follow.  Please assess MELD-related labs daily for now.  Electronically Signed: HPasty Spillers PA 10/14/2021, 3:19 PM   I spent a total of 25 minutes at the the patient's bedside AND on the patient's hospital floor or unit, greater than 50% of which was counseling/coordinating care for GI bleed, splenorenal shunt.

## 2021-10-14 NOTE — Progress Notes (Signed)
eLink Physician-Brief Progress Note Patient Name: Cody Austin DOB: 05/06/1950 MRN: [id number]   Date of Service  10/14/2021  HPI/Events of Note  Multiple issues: 1. Anemia - Hgb = 7.5 --> 7.0 and 2. Thrombocytopenia - Platelets 30 --> 29. Transfuse platelets for Platelets < 10 or active bleeding.   eICU Interventions  Plan: Transfuse 1 unit PRBC.     Intervention Category Major Interventions: Other:  Lucresha Dismuke Cornelia Copa 10/14/2021, 5:11 AM

## 2021-10-14 NOTE — Progress Notes (Signed)
Patient  transported to [address] with patient belongings, including bilateral hearing aids in charging case, and home CPAP.

## 2021-10-14 NOTE — Progress Notes (Signed)
Palliative:  Consult received. Chart reviewed. Called wife - she asks that I call back in AM to schedule family meeting - she would like her children to be at the meeting. Will f/u in AM. Juel Burrow, DNP, [facility] Palliative Medicine Team Team Phone # [phone]  Pager # [phone]  NO CHARGE

## 2021-10-14 NOTE — Consult Note (Signed)
WOC Nurse Consult Note: Reason for Consult:Skin tears on upper extremities.  Medical history reviewed by this Probation officer. Wound type: traumatic Pressure Injury POA: N/A Measurement: Bedside RN to measure and document on Nursing Flow Sheet with first dressing application Wound bed:red, moist Drainage (amount, consistency, odor) serosanguinous Periwound:ecchymotic Dressing procedure/placement/frequency: A silicone wound contact layer (Mepitel, [place] # [id number]) will allow for atraumatic dressing changes a well as exudate transfer into an absorbant dressing. Guidance has been provided for Nursing in the care of the skin tears using a NS cleanse followed by placement of Mepitel topped with xeroform (antibacterial, nonadherent) gauze, dry gauze and secured with a few turns of Kerlix roll gauze. The topper dressings are to be changed daily, the Mepitel may be reused for up to 3 days, change PRN.  All other PI prevention measures are in place.  [place] nursing team will not follow, but will remain available to this patient, the nursing and medical teams.  Please re-consult if needed.  Thank you for inviting [place] to participate in this patient's Plan of Care.  Maudie Flakes, MSN, RN, CNS, [place], Serita Grammes, [employer], [employer] phone:  [phone]

## 2021-10-15 ENCOUNTER — Encounter (HOSPITAL_COMMUNITY): Payer: Self-pay | Admitting: Internal Medicine

## 2021-10-15 DIAGNOSIS — R188 Other ascites: Secondary | ICD-10-CM | POA: Diagnosis not present

## 2021-10-15 DIAGNOSIS — Z515 Encounter for palliative care: Secondary | ICD-10-CM

## 2021-10-15 DIAGNOSIS — K7460 Unspecified cirrhosis of liver: Secondary | ICD-10-CM | POA: Diagnosis not present

## 2021-10-15 DIAGNOSIS — N179 Acute kidney failure, unspecified: Secondary | ICD-10-CM | POA: Diagnosis not present

## 2021-10-15 DIAGNOSIS — Z66 Do not resuscitate: Secondary | ICD-10-CM

## 2021-10-15 DIAGNOSIS — K922 Gastrointestinal hemorrhage, unspecified: Secondary | ICD-10-CM | POA: Diagnosis not present

## 2021-10-15 DIAGNOSIS — Z7189 Other specified counseling: Secondary | ICD-10-CM | POA: Diagnosis not present

## 2021-10-15 DIAGNOSIS — D62 Acute posthemorrhagic anemia: Secondary | ICD-10-CM | POA: Diagnosis not present

## 2021-10-15 DIAGNOSIS — L8990 Pressure ulcer of unspecified site, unspecified stage: Secondary | ICD-10-CM | POA: Insufficient documentation

## 2021-10-15 LAB — BASIC METABOLIC PANEL
Anion gap: 13 (ref 5–15)
BUN: 83 mg/dL — ABNORMAL HIGH (ref 8–23)
CO2: 19 mmol/L — ABNORMAL LOW (ref 22–32)
Calcium: 8.3 mg/dL — ABNORMAL LOW (ref 8.9–10.3)
Chloride: 109 mmol/L (ref 98–111)
Creatinine, Ser: 1.4 mg/dL — ABNORMAL HIGH (ref 0.61–1.24)
GFR, Estimated: 54 mL/min — ABNORMAL LOW (ref >60)
Glucose, Bld: 100 mg/dL — ABNORMAL HIGH (ref 70–99)
Potassium: 5.5 mmol/L — ABNORMAL HIGH (ref 3.5–5.1)
Sodium: 141 mmol/L (ref 135–145)

## 2021-10-15 LAB — CBC WITH DIFFERENTIAL/PLATELET
Abs Immature Granulocytes: 0.28 10*3/uL — ABNORMAL HIGH (ref 0.00–0.07)
Basophils Absolute: 0 10*3/uL (ref 0.0–0.1)
Basophils Relative: 0 %
Eosinophils Absolute: 0.4 10*3/uL (ref 0.0–0.5)
Eosinophils Relative: 2 %
HCT: 22.4 % — ABNORMAL LOW (ref 39.0–52.0)
Hemoglobin: 7.5 g/dL — ABNORMAL LOW (ref 13.0–17.0)
Immature Granulocytes: 1 %
Lymphocytes Relative: 4 %
Lymphs Abs: 0.8 10*3/uL (ref 0.7–4.0)
MCH: 32.9 pg (ref 26.0–34.0)
MCHC: 33.5 g/dL (ref 30.0–36.0)
MCV: 98.2 fL (ref 80.0–100.0)
Monocytes Absolute: 1.6 10*3/uL — ABNORMAL HIGH (ref 0.1–1.0)
Monocytes Relative: 8 %
Neutro Abs: 16.6 10*3/uL — ABNORMAL HIGH (ref 1.7–7.7)
Neutrophils Relative %: 85 %
Platelets: 31 10*3/uL — ABNORMAL LOW (ref 150–400)
RBC: 2.28 MIL/uL — ABNORMAL LOW (ref 4.22–5.81)
RDW: 25.2 % — ABNORMAL HIGH (ref 11.5–15.5)
Smear Review: DECREASED
WBC: 19.8 10*3/uL — ABNORMAL HIGH (ref 4.0–10.5)
nRBC: 0 % (ref 0.0–0.2)

## 2021-10-15 LAB — TYPE AND SCREEN
ABO/RH(D): A POS
Antibody Screen: NEGATIVE
Unit division: 0

## 2021-10-15 LAB — GLUCOSE, CAPILLARY
Glucose-Capillary: 100 mg/dL — ABNORMAL HIGH (ref 70–99)
Glucose-Capillary: 105 mg/dL — ABNORMAL HIGH (ref 70–99)
Glucose-Capillary: 65 mg/dL — ABNORMAL LOW (ref 70–99)
Glucose-Capillary: 69 mg/dL — ABNORMAL LOW (ref 70–99)
Glucose-Capillary: 73 mg/dL (ref 70–99)
Glucose-Capillary: 76 mg/dL (ref 70–99)
Glucose-Capillary: 78 mg/dL (ref 70–99)
Glucose-Capillary: 93 mg/dL (ref 70–99)
Glucose-Capillary: 94 mg/dL (ref 70–99)

## 2021-10-15 LAB — BPAM RBC
Blood Product Expiration Date: 202307172359
ISSUE DATE / TIME: 202306280644
Unit Type and Rh: 6200

## 2021-10-15 LAB — MAGNESIUM: Magnesium: 2.1 mg/dL (ref 1.7–2.4)

## 2021-10-15 LAB — PHOSPHORUS: Phosphorus: 3.9 mg/dL (ref 2.5–4.6)

## 2021-10-15 MED ORDER — SODIUM CHLORIDE 0.9% FLUSH: 10.0000 mL | INTRAVENOUS | Status: DC | PRN

## 2021-10-15 MED ORDER — SODIUM CHLORIDE 0.9% FLUSH
10.0000 mL | Freq: Two times a day (BID) | INTRAVENOUS | Status: DC
  Administered 2021-10-15 – 2021-10-16 (×4): 10 mL

## 2021-10-15 NOTE — Care Management Important Message (Signed)
Important Message  Patient Details  Name: Cody Austin MRN: [id number] Date of Birth: 04/02/1951   Medicare Important Message Given:  Yes     Lynnie Koehler 10/15/2021, 2:45 PM

## 2021-10-15 NOTE — Progress Notes (Addendum)
Progress Note    TAO SATZ  EHU:[id number] DOB: [birth year]  DOA: 10/05/2021 PCP: Rogene Houston, MD      Brief Narrative:    Medical records reviewed and are as summarized below:  Cody Austin is a 71 y.o. male   with NASH cirrhosis  receiving paracentesis approximately every 2 weeks yielding 4.5 most recently on 09/30/21 with w/u c/w main portal vein stenosis likely secondary to chronic non-occlusive thrombus, a massive splenorenal shunt, advanced cirrhosis, hepatocellular carcinoma, hepatorenal syndrome, and hyperbilirubinemia.  He was recently admitted to [facility] with acute anemia and melena, to complicate his case further, and underwent EGD (09/23/21) which showed grade 1 esophageal varices and moderate diffuse portal hypertensive gastropathy with antral bleeding that was controlled with hemostatic spray.  He required blood transfusion during this admission.    He presented on the morning of 10/05/2021 to [facility] ER with weakness, confusion  and low cbg/ hgb and PCCM consulted re transfer to [facility] where he is scheduled to undergo attempting portal vein angioplasty/stenting and shunt embolization on 7/85 with main complication = florid terminal liver failure per IR's note 10/01/21 and pt agreed as alternative to repeat massive paracentesis.     Pertinent  Medical History  NASh cirrhosis complicated by recurrent ascites, portal vein stenosis secondary to chronic nonocclusive thrombus, splenorenal shunt, hepatocellular carcinoma, hepatorenal syndrome, grade 1 varices, portal hypertensive gastropathy, OSA   Significant Hospital Events: Including procedures, antibiotic start and stop dates in addition to other pertinent events   6/19 Presented to [facility] and transferred [facility] 6/21 EGD with no focal source of bleed. Likely secondary to [place]. S/p hemostatic spray 6/23 S/p splenorenal shunt embolization and TIPS creation with reduction of flow by 90%. Paracentesis performed with 3L  removed 6/24 Weaned off pressors. 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  (Obesity)   Hemorrhagic shock/acute on chronic blood loss anemia with coagulopathy and thrombocytopenia- Shock resolved.  S/p transfusion 9 units of PRBCs S/p transfusion with 8 units of fresh frozen plasma S/p transfusion with 9 units of platelets pheresis Melena/upper GI bleeding EGD 6/21 with no focal source of bleed.  EGD showed erosive gastropathy, clotted blood in the stomach, portal hypertensive gastropathy/gastritis Elevated INR - improved End stage NASH cirrhosis with recurrent ascites s/p splenorenal shunt embolization and TIPS creation 6/24- not transplant candidate due to CAD hx, followed by [facility] Possible HCC - not a candidate for chemo or TACE Hepatorenal syndrome Volume overload/anasarca on IV diuretics Klebsiella pneumonia bacteremia- on 10 day course of ancef NSTEMI in setting of anemia and shock CAD- not intervention candidate Severe aortic stenosis- not intervention candidate Hypoglycemia, recurrent-likely from cirrhosis and poor oral intake Dysphagia-continue dysphagia 3 diet as tolerated Hyperkalemia-hold Aldactone   His prognosis is poor.  Discussed goals of  care with the patient.  He wanted to wait for his family.  Plan of care was discussed with his wife.  She said she had  spoken with palliative care team and they are yet to make a decision on goals of care.   Diet Order             DIET DYS 3 Room service appropriate? Yes; Fluid consistency: Thin  Diet effective now                        Consultants: Intensivist, interventional radiologist, gastroenterologist, palliative care  Procedures: TIPS creation, splenorenal shunt embolization and portal vein angioplasty on 10/09/2021 EGD on 10/07/2021 Intubation on 10/07/2021, extubated on 10/09/2021    Medications:    Chlorhexidine Gluconate Cloth  6 each Topical Q0600   furosemide  40 mg Intravenous Q8H   lactulose  30 g Oral BID   levothyroxine  37.5 mcg Oral Q0600   midodrine  10 mg Oral TID WC   mouth rinse  15 mL Mouth Rinse 4 times per day   pantoprazole (PROTONIX) IV  40 mg Intravenous Q12H   rifaximin  550 mg Oral BID   sodium chloride flush  10-40 mL Intracatheter Q12H   Continuous Infusions:  sodium chloride 250 mL (10/14/21 1900)   sodium chloride 10 mL/hr at 10/13/21 0518   dextrose Stopped (10/13/21 1424)     Anti-infectives (From admission, onward)    Start     Dose/Rate Route Frequency Ordered Stop   10/10/21 1245  rifaximin (XIFAXAN) tablet 550 mg        550 mg Oral 2 times daily 10/10/21 1147     10/10/21 0200  ceFAZolin (ANCEF) IVPB 2g/100 mL premix        2 g 200 mL/hr over 30 Minutes Intravenous Every 8 hours 10/09/21 2031 10/15/21 0844   10/09/21 0000  cefTRIAXone (ROCEPHIN) 2 g in sodium chloride 0.9 % 100 mL IVPB        2 g 200 mL/hr over 30 Minutes Intravenous To Radiology 10/08/21 1248 10/09/21 0220   10/08/21 2200  rifaximin (XIFAXAN) tablet 550 mg  Status:  Discontinued        550 mg Oral 2 times daily 10/08/21 1732 10/08/21 1733   10/07/21 1800  ceFAZolin (ANCEF) IVPB 2g/100 mL premix  Status:  Discontinued        2 g 200 mL/hr over 30 Minutes Intravenous Every 12 hours 10/07/21 0943 10/09/21 2031   10/06/21 1800  cefTRIAXone (ROCEPHIN) 2 g in sodium  chloride 0.9 % 100 mL IVPB  Status:  Discontinued        2 g 200 mL/hr over 30 Minutes Intravenous Every 24 hours 10/06/21 0017 10/07/21 0943   10/06/21 1200  ceFEPIme (MAXIPIME) 2 g in sodium chloride 0.9 % 100 mL IVPB  Status:  Discontinued        2 g 200 mL/hr over 30 Minutes Intravenous Every 24 hours 10/05/21 1805 10/05/21 1835   10/06/21 1200  ceFEPIme (MAXIPIME) 2 g in sodium chloride 0.9 % 100 mL IVPB  Status:  Discontinued        2 g 200 mL/hr over 30 Minutes Intravenous Every 24 hours 10/05/21 2218 10/06/21 0017   10/06/21 0945  vancomycin (VANCOREADY) IVPB 1250 mg/250 mL  Status:  Discontinued       See Hyperspace for full Linked Orders Report.   1,250 mg 166.7 mL/hr over 90 Minutes Intravenous Every 24 hours 10/05/21  8119 10/05/21 1805   10/05/21 2200  ceFEPIme (MAXIPIME) 2 g in sodium chloride 0.9 % 100 mL IVPB  Status:  Discontinued        2 g 200 mL/hr over 30 Minutes Intravenous Every 12 hours 10/05/21 0847 10/05/21 1805   10/05/21 2200  rifaximin (XIFAXAN) tablet 550 mg  Status:  Discontinued        550 mg Oral 2 times daily 10/05/21 1829 10/07/21 0940   10/05/21 2000  cefTRIAXone (ROCEPHIN) 2 g in sodium chloride 0.9 % 100 mL IVPB  Status:  Discontinued        2 g 200 mL/hr over 30 Minutes Intravenous Every 24 hours 10/05/21 1835 10/05/21 2218   10/05/21 1805  vancomycin variable dose per unstable renal function (pharmacist dosing)  Status:  Discontinued         Does not apply See admin instructions 10/05/21 1805 10/05/21 1835   10/05/21 0935  vancomycin (VANCOREADY) IVPB 500 mg/100 mL       See Hyperspace for full Linked Orders Report.   500 mg 100 mL/hr over 60 Minutes Intravenous  Once 10/05/21 0847 10/05/21 1043   10/05/21 0830  ceFEPIme (MAXIPIME) 2 g in sodium chloride 0.9 % 100 mL IVPB        2 g 200 mL/hr over 30 Minutes Intravenous  Once 10/05/21 0823 10/05/21 1247   10/05/21 0830  metroNIDAZOLE (FLAGYL) IVPB 500 mg        500 mg 100 mL/hr over 60 Minutes  Intravenous  Once 10/05/21 0823 10/05/21 1338   10/05/21 0830  vancomycin (VANCOCIN) IVPB 1000 mg/200 mL premix        1,000 mg 200 mL/hr over 60 Minutes Intravenous  Once 10/05/21 0823 10/05/21 0935              Family Communication/Anticipated D/C date and plan/Code Status   DVT prophylaxis: SCDs Start: 10/05/21 1824 Place and maintain sequential compression device Start: 10/05/21 1749     Code Status: DNR  Family Communication: Plan discussed with [name], wife, phone Disposition Plan: To be determined   Status is: Inpatient Remains inpatient appropriate because: IV Lasix       Subjective:   Interval events noted.  Complains of generalized weakness and fatigue.  No pain.  I noticed black tarry stools on the sheet  Objective:    Vitals:   10/15/21 0400 10/15/21 0740 10/15/21 1220 10/15/21 1643  BP: 117/61 91/60 (!) 103/58 115/60  Pulse: 78 78 83 78  Resp: 15 16 16 17   Temp: 98.4 F (36.9 C) 97.6 F (36.4 C) 97.6 F (36.4 C) 97.8 F (36.6 C)  TempSrc: Axillary Axillary Axillary Oral  SpO2: 99% 90% 93% 97%  Weight:      Height:       No data found.   Intake/Output Summary (Last 24 hours) at 10/15/2021 1657 Last data filed at 10/15/2021 1200 Gross per 24 hour  Intake 0 ml  Output 3425 ml  Net -3425 ml   Filed Weights   10/12/21 0500 10/13/21 0500 10/14/21 0500  Weight: 105.1 kg 107.5 kg 106.4 kg    Exam:  GEN: NAD, ill looking SKIN: Bruises on upper and lower extremities.  Jaundice EYES: Pale, icteric ENT: MMM CV: RRR PULM: CTA B ABD: soft, distended, NT, +BS CNS: AAO x 3, non focal EXT: Bilateral lower extremity edema, no tenderness    Pressure Injury 10/13/21 Buttocks Left Stage 2 -  Partial thickness loss of dermis presenting as a shallow  open injury with a red, pink wound bed without slough. (Active)  10/13/21 0000  Location: Buttocks  Location Orientation: Left  Staging: Stage 2 -  Partial thickness loss of dermis presenting  as a shallow open injury with a red, pink wound bed without slough.  Wound Description (Comments):   Present on Admission:   Dressing Type Foam - Lift dressing to assess site every shift 10/14/21 2100     Data Reviewed:   I have personally reviewed following labs and imaging studies:  Labs: Labs show the following:   Basic Metabolic Panel: Recent Labs  Lab 10/09/21 0039 10/09/21 0511 10/10/21 0334 10/11/21 0338 10/12/21 0322 10/12/21 2137 10/13/21 0450 10/13/21 0500 10/14/21 0117 10/15/21 0112  NA 142   < > 142   < > 142 139  --  142 141 141  K 3.2*   < > 3.6   < > 3.4* 3.4*  --  3.6 4.5 5.5*  CL 109   < > 110   < > 111 106  --  108 108 109  CO2 20*   < > 22   < > 27 26  --  27 25 19*  GLUCOSE 86   < > 129*   < > 116* 116*  --  95 95 100*  BUN 94*   < > 95*   < > 89* 80*  --  76* 74* 83*  CREATININE 2.76*   < > 2.30*   < > 1.63* 1.42*  --  1.48* 1.50* 1.40*  CALCIUM 7.5*   < > 8.5*   < > 8.0* 7.7*  --  7.9* 7.9* 8.3*  MG 1.8  --  2.1  --   --   --  1.7  --  2.0 2.1  PHOS  --   --  4.4  --   --   --   --   --   --  3.9   < > = values in this interval not displayed.   GFR Estimated Creatinine Clearance: 57.2 mL/min (A) (by C-G formula based on SCr of 1.4 mg/dL (H)). Liver Function Tests: Recent Labs  Lab 10/11/21 0338 10/12/21 0322 10/12/21 2137 10/13/21 0500 10/14/21 0117  AST 110* 81* 72* 70* 67*  ALT 65* 35 23 21 17   ALKPHOS 62 68 77 75 90  BILITOT 11.2* 12.4* 13.2* 14.8* 16.6*  PROT 3.5* 3.6* 3.5* 3.6* 3.6*  ALBUMIN 2.0* 1.9* 1.8* 1.8* 1.7*   No results for input(s): "LIPASE", "AMYLASE" in the last 168 hours. No results for input(s): "AMMONIA" in the last 168 hours. Coagulation profile Recent Labs  Lab 10/10/21 0334 10/11/21 0338 10/11/21 1800 10/12/21 0322 10/13/21 0500  INR 2.8* 3.4* 3.0* 3.2* 3.2*    CBC: Recent Labs  Lab 10/11/21 0530 10/11/21 1800 10/12/21 0322 10/13/21 0500 10/14/21 0340 10/15/21 1039  WBC 10.1 12.9*  --  16.5*  21.1* 19.8*  NEUTROABS  --   --   --  14.3* 18.4* 16.6*  HGB 7.3* 7.5*  --  7.5* 7.0* 7.5*  HCT 21.0* 21.8*  --  22.8* 21.5* 22.4*  MCV 91.7 94.4  --  97.9 100.9* 98.2  PLT 48* 48*  49* 33* 30*  31* 29* 31*   Cardiac Enzymes: No results for input(s): "CKTOTAL", "CKMB", "CKMBINDEX", "TROPONINI" in the last 168 hours. BNP (last 3 results) No results for input(s): "PROBNP" in the last 8760 hours. CBG: Recent Labs  Lab 10/15/21 0116 10/15/21 0545 10/15/21 0741 10/15/21 1220 10/15/21  [place] 93 94 73   D-Dimer: Recent Labs    10/13/21 0500  DDIMER 19.82*   Hgb A1c: No results for input(s): "HGBA1C" in the last 72 hours. Lipid Profile: No results for input(s): "CHOL", "HDL", "LDLCALC", "TRIG", "CHOLHDL", "LDLDIRECT" in the last 72 hours. Thyroid function studies: No results for input(s): "TSH", "T4TOTAL", "T3FREE", "THYROIDAB" in the last 72 hours.  Invalid input(s): "FREET3" Anemia work up: No results for input(s): "VITAMINB12", "FOLATE", "FERRITIN", "TIBC", "IRON", "RETICCTPCT" in the last 72 hours. Sepsis Labs: Recent Labs  Lab 10/11/21 1800 10/13/21 0500 10/14/21 0340 10/15/21 1039  WBC 12.9* 16.5* 21.1* 19.8*    Microbiology Recent Results (from the past 240 hour(s))  Urine Culture     Status: None   Collection Time: 10/05/21  5:24 PM   Specimen: In/Out Cath Urine  Result Value Ref Range Status   Specimen Description IN/OUT CATH URINE  Final   Special Requests NONE  Final   Culture   Final    NO GROWTH Performed at [facility], [address] [place], [place] [address]    Report Status 10/06/2021 FINAL  Final  MRSA Next Gen by PCR, Nasal     Status: None   Collection Time: 10/05/21  5:29 PM   Specimen: Nasal Mucosa; Nasal Swab  Result Value Ref Range Status   MRSA by PCR Next Gen NOT DETECTED NOT DETECTED Final    Comment: (NOTE) The GeneXpert MRSA Assay (FDA approved for NASAL specimens only), is one component of a  comprehensive MRSA colonization surveillance program. It is not intended to diagnose MRSA infection nor to guide or monitor treatment for MRSA infections. Test performance is not FDA approved in patients less than 34 years old. Performed at [facility], [place] [address] [place], [place] [address]   Culture, blood (Routine X 2) w Reflex to ID Panel     Status: None   Collection Time: 10/07/21  1:45 AM   Specimen: BLOOD  Result Value Ref Range Status   Specimen Description BLOOD LEFT ANTECUBITAL  Final   Special Requests   Final    BOTTLES DRAWN AEROBIC AND ANAEROBIC Blood Culture adequate volume   Culture   Final    NO GROWTH 5 DAYS Performed at [facility], [place] [address], [place], [place] [address]    Report Status 10/12/2021 FINAL  Final  Culture, blood (Routine X 2) w Reflex to ID Panel     Status: None   Collection Time: 10/07/21  1:55 AM   Specimen: BLOOD LEFT HAND  Result Value Ref Range Status   Specimen Description BLOOD LEFT HAND  Final   Special Requests IN PEDIATRIC BOTTLE Blood Culture adequate volume  Final   Culture   Final    NO GROWTH 5 DAYS Performed at [facility], [place] [address], [place], [place] [address]    Report Status 10/12/2021 FINAL  Final    Procedures and diagnostic studies:  No results found.             LOS: 10 days   Eryx Zane  [facility]   Pager on [email or web address]. If 7PM-7AM, please contact night-coverage at [email or web address]     10/15/2021, 4:57 PM

## 2021-10-15 NOTE — Consult Note (Signed)
Consultation Note Date: 10/15/2021   Patient Name: Cody Austin  DOB: [birth year]  MRN: [id number]  Age / Sex: 71 y.o., male  PCP: Rogene Houston, MD Referring Physician: Jennye Boroughs, MD  Reason for Consultation: Establishing goals of care  HPI/Patient Profile: 71 y.o. male  with past medical history of [name] cirrhosis requiring paracenteses approximately every 2 weeks, ?  Hepatocellular carcinoma, hepatorenal syndrome, esophageal varices, portal hypertensive gastropathy, sleep apnea, and CAD admitted on 10/05/2021 with weakness, confusion, and anemia.  Patient found to have acute on chronic blood loss anemia and hemorrhagic shock.  Required transfusions of multiple blood products.  Patient also with bacteremia.  Patient had splenorenal shunt embolization and TIPS creation 6/24.  Patient is not a liver transplant candidate due to CAD.  PMT consulted to discuss goals of care.  Clinical Assessment and Goals of Care: I have reviewed medical records including EPIC notes, labs and imaging, received report from Dr. Tamala Julian, assessed the patient and then met with patient, wife, and son to discuss diagnosis prognosis, GOC, EOL wishes, disposition and options.  I introduced Palliative Medicine as specialized medical care for people living with serious illness. It focuses on providing relief from the symptoms and stress of a serious illness. The goal is to improve quality of life for both the patient and the family.  Family shares prior to admission patient was quite functional.  Started to experience some decline just prior to admission.  Current status far from baseline.   We discussed patient's current illness and what it means in the larger context of patient's on-going co-morbidities.  Natural disease trajectory and expectations at EOL were discussed.  We discussed patient's cirrhosis and interventions throughout hospitalization.  Family understands that  despite aggressive medical care patient continues to decline -specifically decline in functional status and cognition.  Also with worsening of hemoglobin and platelets.  I attempted to elicit values and goals of care important to the patient.    The difference between aggressive medical intervention and comfort care was considered in light of the patient's goals of care.   We discussed potential options for care following hospitalization.  We reviewed SNF rehab versus home with home health versus home with hospice versus hospice facility.  We discussed concern that with either SNF rehab or home with home health that patient would likely return to the hospital due to ongoing medical complications.  Family expresses understanding.  We discussed most appropriate moving forward may be hospice involvement.  Wife shares concern about caring for patient at home.  We discussed type of support hospice would provide in the home.  They are familiar with [facility] in [place] and we discussed type of care that could be provided in this hospice facility.  Wife asks about prognosis and I shared that I do the hospice home when accept him and they typically accept patient when time is limited to weeks.  Wife and son I will request time to consider their options and discuss amongst himself before making a decision.  Discussed with family the importance of continued conversation with family and the medical providers regarding overall plan of care and treatment options, ensuring decisions are within the context of the patients values and GOCs.    Questions and concerns were addressed. The family was encouraged to call with questions or concerns.  Primary Decision Maker NEXT OF KIN    SUMMARY OF RECOMMENDATIONS   Initial goals of care conversation -family understands poor prognosis Detailed options for  care moving forward: SNF versus home health versus home with hospice versus hospice facility -family  considering options PMT will follow  Code Status/Advance Care Planning: DNR  Discharge Planning: To Be Determined      Primary Diagnoses: Present on Admission:  GI bleed  Thrombocytopenia (HCC)  Symptomatic anemia  Hepatic cirrhosis (HCC)  Encephalopathy, hepatic (HCC)  Cirrhosis, nonalcoholic (HCC)  Ascites  AKI (acute kidney injury) ([place])  Shock circulatory ([place])   I have reviewed the medical record, interviewed the patient and family, and examined the patient. The following aspects are pertinent.  Past Medical History:  Diagnosis Date   AKI (acute kidney injury) ([place]) 07/17/2021   Arthritis    Cancer, hepatocellular (HCC)    Coronary artery disease    Heart murmur    History of kidney stones    Hypothyroidism 07/17/2021   NASH Liver cirrhosis secondary to NASH (nonalcoholic steatohepatitis) 06/01/2019   NSTEMI (non-ST elevated myocardial infarction) ([place])    Other pancytopenia ([place]) 11/19/2014   Peripheral arterial disease ([place]) 05/28/2019   Sleep apnea    uses CPAP   Social History   Socioeconomic History   Marital status: Married    Spouse name: Not on file   Number of children: Not on file   Years of education: Not on file   Highest education level: Not on file  Occupational History   Not on file  Tobacco Use   Smoking status: Never    Passive exposure: Never   Smokeless tobacco: Never  Vaping Use   Vaping Use: Never used  Substance and Sexual Activity   Alcohol use: Not Currently   Drug use: No   Sexual activity: Yes  Other Topics Concern   Not on file  Social History Narrative   Not on file   Social Determinants of Health   Financial Resource Strain: Not on file  Food Insecurity: Not on file  Transportation Needs: Not on file  Physical Activity: Not on file  Stress: Not on file  Social Connections: Not on file   Family History  Problem Relation Age of Onset   Cancer Father    Aneurysm Father    Scheduled Meds:  Chlorhexidine  Gluconate Cloth  6 each Topical Q0600   furosemide  40 mg Intravenous Q8H   lactulose  30 g Oral BID   levothyroxine  37.5 mcg Oral Q0600   midodrine  10 mg Oral TID WC   mouth rinse  15 mL Mouth Rinse 4 times per day   pantoprazole (PROTONIX) IV  40 mg Intravenous Q12H   rifaximin  550 mg Oral BID   sodium chloride flush  10-40 mL Intracatheter Q12H   spironolactone  50 mg Oral Daily   Continuous Infusions:  sodium chloride 250 mL (10/14/21 1900)   sodium chloride 10 mL/hr at 10/13/21 0518   dextrose Stopped (10/13/21 1424)   PRN Meds:.ondansetron (ZOFRAN) IV, mouth rinse, sodium chloride, sodium chloride flush Allergies  Allergen Reactions   Albumin (Human) Other (See Comments)    Chest pain with fast infusion: Patient requires that Albumin infusion be slowed to over 10 hours or longer and only 1 unit at a time to prevent reaction.    Review of Systems  Constitutional:  Positive for activity change, appetite change and fatigue.  Neurological:  Positive for weakness.    Physical Exam Constitutional:      General: He is not in acute distress.    Appearance: He is ill-appearing.     Comments:  Interaction limited  Pulmonary:     Effort: Pulmonary effort is normal.  Skin:    General: Skin is warm and dry.     Vital Signs: BP (!) 103/58 (BP Location: Right Arm)   Pulse 83   Temp 97.6 F (36.4 C) (Axillary)   Resp 16   Ht _0  (1.727 m)   Wt 106.4 kg   SpO2 93%   BMI 35.67 kg/m  Pain Scale: 0-10   Pain Score: 0-No pain   SpO2: SpO2: 93 % O2 Device:SpO2: 93 % O2 Flow Rate: .O2 Flow Rate (L/min): 3 L/min  IO: Intake/output summary:  Intake/Output Summary (Last 24 hours) at 10/15/2021 1347 Last data filed at 10/15/2021 1200 Gross per 24 hour  Intake 480 ml  Output 3775 ml  Net -3295 ml    LBM: Last BM Date : 10/14/21 Baseline Weight: Weight: 80.9 kg Most recent weight: Weight: 106.4 kg     Palliative Assessment/Data: PPS 40%     Juel Burrow, DNP,  AGNP-C Palliative Medicine Team [phone] Pager: [phone]

## 2021-10-15 NOTE — Plan of Care (Signed)

## 2021-10-16 DIAGNOSIS — D649 Anemia, unspecified: Secondary | ICD-10-CM

## 2021-10-16 DIAGNOSIS — K922 Gastrointestinal hemorrhage, unspecified: Secondary | ICD-10-CM | POA: Diagnosis not present

## 2021-10-16 DIAGNOSIS — N179 Acute kidney failure, unspecified: Secondary | ICD-10-CM | POA: Diagnosis not present

## 2021-10-16 DIAGNOSIS — Z515 Encounter for palliative care: Secondary | ICD-10-CM | POA: Diagnosis not present

## 2021-10-16 DIAGNOSIS — K7581 Nonalcoholic steatohepatitis (NASH): Secondary | ICD-10-CM | POA: Diagnosis not present

## 2021-10-16 DIAGNOSIS — Z7189 Other specified counseling: Secondary | ICD-10-CM | POA: Diagnosis not present

## 2021-10-16 DIAGNOSIS — D62 Acute posthemorrhagic anemia: Secondary | ICD-10-CM | POA: Diagnosis not present

## 2021-10-16 LAB — CBC WITH DIFFERENTIAL/PLATELET
Abs Immature Granulocytes: 0.24 10*3/uL — ABNORMAL HIGH (ref 0.00–0.07)
Basophils Absolute: 0 10*3/uL (ref 0.0–0.1)
Basophils Relative: 0 %
Eosinophils Absolute: 0.6 10*3/uL — ABNORMAL HIGH (ref 0.0–0.5)
Eosinophils Relative: 3 %
HCT: 23.1 % — ABNORMAL LOW (ref 39.0–52.0)
Hemoglobin: 7.6 g/dL — ABNORMAL LOW (ref 13.0–17.0)
Immature Granulocytes: 1 %
Lymphocytes Relative: 6 %
Lymphs Abs: 1 10*3/uL (ref 0.7–4.0)
MCH: 33 pg (ref 26.0–34.0)
MCHC: 32.9 g/dL (ref 30.0–36.0)
MCV: 100.4 fL — ABNORMAL HIGH (ref 80.0–100.0)
Monocytes Absolute: 1.7 10*3/uL — ABNORMAL HIGH (ref 0.1–1.0)
Monocytes Relative: 10 %
Neutro Abs: 14.2 10*3/uL — ABNORMAL HIGH (ref 1.7–7.7)
Neutrophils Relative %: 80 %
Platelets: 33 10*3/uL — ABNORMAL LOW (ref 150–400)
RBC: 2.3 MIL/uL — ABNORMAL LOW (ref 4.22–5.81)
RDW: 26.7 % — ABNORMAL HIGH (ref 11.5–15.5)
Smear Review: NORMAL
WBC: 17.7 10*3/uL — ABNORMAL HIGH (ref 4.0–10.5)
nRBC: 0 % (ref 0.0–0.2)

## 2021-10-16 LAB — PROTIME-INR
INR: 2.9 — ABNORMAL HIGH (ref 0.8–1.2)
Prothrombin Time: 29.9 seconds — ABNORMAL HIGH (ref 11.4–15.2)

## 2021-10-16 LAB — COMPREHENSIVE METABOLIC PANEL
ALT: 19 U/L (ref 0–44)
AST: 66 U/L — ABNORMAL HIGH (ref 15–41)
Albumin: 1.7 g/dL — ABNORMAL LOW (ref 3.5–5.0)
Alkaline Phosphatase: 89 U/L (ref 38–126)
Anion gap: 10 (ref 5–15)
BUN: 82 mg/dL — ABNORMAL HIGH (ref 8–23)
CO2: 26 mmol/L (ref 22–32)
Calcium: 8.1 mg/dL — ABNORMAL LOW (ref 8.9–10.3)
Chloride: 103 mmol/L (ref 98–111)
Creatinine, Ser: 1.54 mg/dL — ABNORMAL HIGH (ref 0.61–1.24)
GFR, Estimated: 48 mL/min — ABNORMAL LOW (ref >60)
Glucose, Bld: 88 mg/dL (ref 70–99)
Potassium: 4 mmol/L (ref 3.5–5.1)
Sodium: 139 mmol/L (ref 135–145)
Total Bilirubin: 18.9 mg/dL (ref 0.3–1.2)
Total Protein: 3.7 g/dL — ABNORMAL LOW (ref 6.5–8.1)

## 2021-10-16 LAB — SARS CORONAVIRUS 2 BY RT PCR: SARS Coronavirus 2 by RT PCR: NEGATIVE

## 2021-10-16 LAB — GLUCOSE, CAPILLARY
Glucose-Capillary: 93 mg/dL (ref 70–99)
Glucose-Capillary: 63 mg/dL — ABNORMAL LOW (ref 70–99)
Glucose-Capillary: 98 mg/dL (ref 70–99)

## 2021-10-16 LAB — PHOSPHORUS: Phosphorus: 4.2 mg/dL (ref 2.5–4.6)

## 2021-10-16 LAB — MAGNESIUM: Magnesium: 1.9 mg/dL (ref 1.7–2.4)

## 2021-10-16 MED ORDER — HYDROMORPHONE HCL 1 MG/ML IJ SOLN: 0.5000 mg | INTRAMUSCULAR | Status: DC | PRN

## 2021-10-16 MED ORDER — POLYVINYL ALCOHOL 1.4 % OP SOLN: 1.0000 [drp] | Freq: Four times a day (QID) | OPHTHALMIC | Status: DC | PRN

## 2021-10-16 MED ORDER — GLYCOPYRROLATE 0.2 MG/ML IJ SOLN: 0.2000 mg | INTRAMUSCULAR | Status: DC | PRN

## 2021-10-16 MED ORDER — PANTOPRAZOLE SODIUM 40 MG PO TBEC
40.0000 mg | DELAYED_RELEASE_TABLET | Freq: Two times a day (BID) | ORAL | Status: DC
  Administered 2021-10-16 – 2021-10-18 (×4): 40 mg via ORAL
  Filled 2021-10-16 (×4): qty 1

## 2021-10-16 MED ORDER — SPIRONOLACTONE 25 MG PO TABS
50.0000 mg | ORAL_TABLET | Freq: Every day | ORAL | Status: DC
  Administered 2021-10-16: 50 mg via ORAL
  Filled 2021-10-16: qty 2

## 2021-10-16 MED ORDER — LORAZEPAM 2 MG/ML IJ SOLN: 0.5000 mg | INTRAMUSCULAR | Status: DC | PRN

## 2021-10-16 MED ORDER — GLYCOPYRROLATE 1 MG PO TABS
1.0000 mg | ORAL_TABLET | ORAL | Status: DC | PRN
  Filled 2021-10-16: qty 1

## 2021-10-16 NOTE — Progress Notes (Addendum)
Progress Note    Cody Austin  MWN:[id number] DOB: 01-07-1951  DOA: 10/05/2021 PCP: Cody Houston, MD      Brief Narrative:    Medical records reviewed and are as summarized below:  Cody Austin is a 71 y.o. male   with NASH cirrhosis  receiving paracentesis approximately every 2 weeks yielding 4.5 most recently on 09/30/21 with w/u c/w main portal vein stenosis likely secondary to chronic non-occlusive thrombus, a massive splenorenal shunt, advanced cirrhosis, hepatocellular carcinoma, hepatorenal syndrome, and hyperbilirubinemia.  He was recently admitted to [facility] with acute anemia and melena, to complicate his case further, and underwent EGD (09/23/21) which showed grade 1 esophageal varices and moderate diffuse portal hypertensive gastropathy with antral bleeding that was controlled with hemostatic spray.  He required blood transfusion during this admission.    He presented on the morning of 10/05/2021 to [facility] ER with weakness, confusion  and low cbg/ hgb and PCCM consulted re transfer to [facility] where he is scheduled to undergo attempting portal vein angioplasty/stenting and shunt embolization on 4/03 with main complication = florid terminal liver failure per IR's note 10/01/21 and pt agreed as alternative to repeat massive paracentesis.     Pertinent  Medical History  NASh cirrhosis complicated by recurrent ascites, portal vein stenosis secondary to chronic nonocclusive thrombus, splenorenal shunt, hepatocellular carcinoma, hepatorenal syndrome, grade 1 varices, portal hypertensive gastropathy, OSA   Significant Hospital Events: Including procedures, antibiotic start and stop dates in addition to other pertinent events   6/19 Presented to [facility] and transferred [facility] 6/21 EGD with no focal source of bleed. Likely secondary to [place]. S/p hemostatic spray 6/23 S/p splenorenal shunt embolization and TIPS creation with reduction of flow by 90%. Paracentesis performed with 3L  removed 6/24 Weaned off pressors. 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  (Obesity)   Hemorrhagic shock/acute on chronic blood loss anemia with coagulopathy and thrombocytopenia- Shock resolved.  S/p transfusion 9 units of PRBCs S/p transfusion with 8 units of fresh frozen plasma S/p transfusion with 9 units of platelets pheresis Melena/upper GI bleeding EGD 6/21 with no focal source of bleed.  EGD showed erosive gastropathy, clotted blood in the stomach, portal hypertensive gastropathy/gastritis Coagulopathy from liver cirrhosis-INR is better  End stage NASH cirrhosis with recurrent ascites s/p splenorenal shunt embolization and TIPS creation 6/24- not transplant candidate due to CAD hx, followed by [facility] Possible HCC - not a candidate for chemo or TACE Hepatorenal syndrome Volume overload/anasarca on IV Lasix Klebsiella pneumonia bacteremia-completed 10-day course of IV Ancef on 10/15/2021. CAD, s/p NSTEMI in setting of anemia and shock not intervention candidate Severe aortic stenosis- not intervention candidate Hypoglycemia, recurrent-likely from cirrhosis and poor oral intake Dysphagia-continue dysphagia 3 diet as tolerated Hyperkalemia-improved.  Restart Aldactone   Prognosis  is poor.  Palliative care team is on board.  Goals of care discussion is ongoing.  His family is  yet to make a decision.   ADDENDUM  Patient and his family have opted for comfort measures after further discussions with palliative care team.  IV Lasix, rifaximin and Aldactone have been discontinued.  IV Dilaudid as needed for pain, IV Ativan as needed for anxiety.  Diet Order             DIET DYS 3 Room service appropriate? Yes; Fluid consistency: Thin  Diet effective now                        Consultants: Intensivist, interventional radiologist, gastroenterologist, palliative care  Procedures: TIPS creation, splenorenal shunt embolization and portal vein angioplasty on 10/09/2021 EGD on 10/07/2021 Intubation on 10/07/2021, extubated on 10/09/2021    Medications:    Chlorhexidine Gluconate Cloth  6 each Topical Q0600   furosemide  40 mg Intravenous Q8H   lactulose  30 g Oral BID   levothyroxine  37.5 mcg Oral Q0600   midodrine  10 mg Oral TID WC   mouth rinse  15 mL Mouth Rinse 4 times per day   pantoprazole  40 mg Oral BID   rifaximin  550 mg Oral BID   sodium chloride flush  10-40 mL Intracatheter Q12H   spironolactone  50 mg Oral Daily   Continuous Infusions:  sodium chloride 250 mL (10/14/21 1900)   sodium chloride 10 mL/hr at 10/13/21 0518   dextrose Stopped (10/13/21 1424)     Anti-infectives (From admission, onward)    Start     Dose/Rate Route Frequency Ordered Stop   10/10/21 1245  rifaximin (XIFAXAN) tablet 550 mg        550 mg Oral 2 times daily 10/10/21 1147     10/10/21 0200  ceFAZolin (ANCEF) IVPB 2g/100 mL premix        2 g 200 mL/hr over 30 Minutes Intravenous Every 8 hours 10/09/21 2031 10/15/21 0844   10/09/21 0000  cefTRIAXone (ROCEPHIN) 2 g in sodium chloride 0.9 % 100 mL IVPB        2 g 200 mL/hr over 30 Minutes Intravenous To Radiology 10/08/21 1248 10/09/21 0220   10/08/21 2200  rifaximin (XIFAXAN) tablet 550 mg  Status:  Discontinued        550 mg Oral 2 times daily 10/08/21 1732 10/08/21 1733   10/07/21 1800  ceFAZolin  (ANCEF) IVPB 2g/100 mL premix  Status:  Discontinued        2 g 200 mL/hr over 30 Minutes Intravenous Every 12 hours 10/07/21 0943 10/09/21 2031   10/06/21 1800  cefTRIAXone (ROCEPHIN) 2 g in sodium chloride 0.9 % 100 mL IVPB  Status:  Discontinued        2 g 200 mL/hr over 30 Minutes Intravenous Every 24 hours 10/06/21 0017 10/07/21 0943   10/06/21 1200  ceFEPIme (MAXIPIME) 2 g in sodium chloride 0.9 % 100 mL IVPB  Status:  Discontinued        2 g 200 mL/hr over 30 Minutes Intravenous Every 24 hours 10/05/21 1805 10/05/21 1835   10/06/21 1200  ceFEPIme (MAXIPIME) 2 g in sodium chloride 0.9 % 100 mL IVPB  Status:  Discontinued        2 g 200 mL/hr over 30 Minutes Intravenous Every 24 hours 10/05/21 2218 10/06/21 0017   10/06/21 0945  vancomycin (VANCOREADY) IVPB 1250 mg/250 mL  Status:  Discontinued       See Hyperspace for full Linked Orders Report.   1,250 mg 166.7 mL/hr over 90 Minutes Intravenous Every 24 hours 10/05/21 0847 10/05/21 1805   10/05/21 2200  ceFEPIme (MAXIPIME) 2 g in sodium chloride 0.9 % 100 mL IVPB  Status:  Discontinued        2 g 200 mL/hr over 30 Minutes Intravenous Every 12 hours 10/05/21 0847 10/05/21 1805   10/05/21 2200  rifaximin (XIFAXAN) tablet 550 mg  Status:  Discontinued        550 mg Oral 2 times daily 10/05/21 1829 10/07/21 0940   10/05/21 2000  cefTRIAXone (ROCEPHIN) 2 g in sodium chloride 0.9 % 100 mL IVPB  Status:  Discontinued        2 g 200 mL/hr over 30 Minutes Intravenous Every 24 hours 10/05/21 1835 10/05/21 2218   10/05/21 1805  vancomycin variable dose per unstable renal function (pharmacist dosing)  Status:  Discontinued         Does not apply See admin instructions 10/05/21 1805 10/05/21 1835   10/05/21 0935  vancomycin (VANCOREADY) IVPB 500 mg/100 mL       See Hyperspace for full Linked Orders Report.   500 mg 100 mL/hr over 60 Minutes Intravenous  Once 10/05/21 0847 10/05/21 1043   10/05/21 0830  ceFEPIme (MAXIPIME) 2 g in sodium  chloride 0.9 % 100 mL IVPB        2 g 200 mL/hr over 30 Minutes Intravenous  Once 10/05/21 0823 10/05/21 1247   10/05/21 0830  metroNIDAZOLE (FLAGYL) IVPB 500 mg        500 mg 100 mL/hr over 60 Minutes Intravenous  Once 10/05/21 0823 10/05/21 1338   10/05/21 0830  vancomycin (VANCOCIN) IVPB 1000 mg/200 mL premix        1,000 mg 200 mL/hr over 60 Minutes Intravenous  Once 10/05/21 0823 10/05/21 0935              Family Communication/Anticipated D/C date and plan/Code Status   DVT prophylaxis: SCDs Start: 10/05/21 1824 Place and maintain sequential compression device Start: 10/05/21 1749     Code Status: DNR  Family Communication: None Disposition Plan: To be determined   Status is: Inpatient Remains inpatient appropriate because: IV Lasix       Subjective:   Interval events noted.  He has no complaints.  He said he cannot describe how he feels.  No pain.  Objective:    Vitals:   10/16/21 0400 10/16/21 0500 10/16/21 0746 10/16/21 1215  BP: 112/75  (!) 113/52 116/60  Pulse: 79  79 77  Resp: 15  20 17   Temp: (!) 97.5 F (36.4 C)  97.6 F (36.4 C) 98 F (36.7 C)  TempSrc: Axillary  Axillary Oral  SpO2: 100%  90% 96%  Weight:  107.2 kg    Height:       No data found.   Intake/Output Summary (Last 24 hours) at 10/16/2021 1336 Last data filed at 10/16/2021 0600 Gross per 24 hour  Intake 400 ml  Output 1600 ml  Net -1200 ml   Filed Weights   10/13/21 0500 10/14/21 0500 10/16/21 0500  Weight: 107.5 kg 106.4 kg 107.2 kg    Exam:  GEN: NAD SKIN: Jaundice.  Bruises on upper and lower extremities EYES: Icteric, pale ENT: MMM, hearing impairment CV: RRR PULM: CTA B ABD: soft, distended, NT, +BS CNS: AAO x 3, non focal EXT: Significant bilateral lower extremity edema from the  thighs to the feet, no tenderness GU: Foley catheter draining orange-looking urine    Pressure Injury 10/13/21 Buttocks Left Stage 2 -  Partial thickness loss of dermis  presenting as a shallow open injury with a red, pink wound bed without slough. (Active)  10/13/21 0000  Location: Buttocks  Location Orientation: Left  Staging: Stage 2 -  Partial thickness loss of dermis presenting as a shallow open injury with a red, pink wound bed without slough.  Wound Description (Comments):   Present on Admission:   Dressing Type Foam - Lift dressing to assess site every shift 10/16/21 0741     Data Reviewed:   I have personally reviewed following labs and imaging studies:  Labs: Labs show the following:   Basic Metabolic Panel: Recent Labs  Lab 10/10/21 0334 10/11/21 0338 10/12/21 2137 10/13/21 0450 10/13/21 0500 10/14/21 0117 10/15/21 0112 10/16/21 0445  NA 142   < > 139  --  142 141 141 139  K 3.6   < > 3.4*  --  3.6 4.5 5.5* 4.0  CL 110   < > 106  --  108 108 109 103  CO2 22   < > 26  --  27 25 19* 26  GLUCOSE 129*   < > 116*  --  95 95 100* 88  BUN 95*   < > 80*  --  76* 74* 83* 82*  CREATININE 2.30*   < > 1.42*  --  1.48* 1.50* 1.40* 1.54*  CALCIUM 8.5*   < > 7.7*  --  7.9* 7.9* 8.3* 8.1*  MG 2.1  --   --  1.7  --  2.0 2.1 1.9  PHOS 4.4  --   --   --   --   --  3.9 4.2   < > = values in this interval not displayed.   GFR Estimated Creatinine Clearance: 52.2 mL/min (A) (by C-G formula based on SCr of 1.54 mg/dL (H)). Liver Function Tests: Recent Labs  Lab 10/12/21 0322 10/12/21 2137 10/13/21 0500 10/14/21 0117 10/16/21 0445  AST 81* 72* 70* 67* 66*  ALT 35 23 21 17 19   ALKPHOS 68 77 75 90 89  BILITOT 12.4* 13.2* 14.8* 16.6* 18.9*  PROT 3.6* 3.5* 3.6* 3.6* 3.7*  ALBUMIN 1.9* 1.8* 1.8* 1.7* 1.7*   No results for input(s): "LIPASE", "AMYLASE" in the last 168 hours. No results for input(s): "AMMONIA" in the last 168 hours. Coagulation profile Recent Labs  Lab 10/11/21 0338 10/11/21 1800 10/12/21 0322 10/13/21 0500 10/16/21 0445  INR 3.4* 3.0* 3.2* 3.2* 2.9*    CBC: Recent Labs  Lab 10/11/21 1800 10/12/21 0322  10/13/21 0500 10/14/21 0340 10/15/21 1039 10/16/21 0445  WBC 12.9*  --  16.5* 21.1* 19.8* 17.7*  NEUTROABS  --   --  14.3* 18.4* 16.6* 14.2*  HGB 7.5*  --  7.5* 7.0* 7.5* 7.6*  HCT 21.8*  --  22.8* 21.5* 22.4* 23.1*  MCV 94.4  --  97.9 100.9* 98.2 100.4*  PLT 48*  49* 33* 30*  31* 29* 31* 33*   Cardiac Enzymes: No results for input(s): "CKTOTAL", "CKMB", "CKMBINDEX", "TROPONINI" in the last 168 hours. BNP (last 3 results) No results for input(s): "PROBNP" in the last 8760 hours. CBG: Recent Labs  Lab 10/15/21 2157 10/15/21 2331 10/16/21 0353 10/16/21 0747 10/16/21 1216  GLUCAP 76 100* 93 63* 98   D-Dimer: No results for input(s): "DDIMER" in the last 72 hours.  Hgb A1c: No results for  input(s): "HGBA1C" in the last 72 hours. Lipid Profile: No results for input(s): "CHOL", "HDL", "LDLCALC", "TRIG", "CHOLHDL", "LDLDIRECT" in the last 72 hours. Thyroid function studies: No results for input(s): "TSH", "T4TOTAL", "T3FREE", "THYROIDAB" in the last 72 hours.  Invalid input(s): "FREET3" Anemia work up: No results for input(s): "VITAMINB12", "FOLATE", "FERRITIN", "TIBC", "IRON", "RETICCTPCT" in the last 72 hours. Sepsis Labs: Recent Labs  Lab 10/13/21 0500 10/14/21 0340 10/15/21 1039 10/16/21 0445  WBC 16.5* 21.1* 19.8* 17.7*    Microbiology Recent Results (from the past 240 hour(s))  Culture, blood (Routine X 2) w Reflex to ID Panel     Status: None   Collection Time: 10/07/21  1:45 AM   Specimen: BLOOD  Result Value Ref Range Status   Specimen Description BLOOD LEFT ANTECUBITAL  Final   Special Requests   Final    BOTTLES DRAWN AEROBIC AND ANAEROBIC Blood Culture adequate volume   Culture   Final    NO GROWTH 5 DAYS Performed at [facility], [place] [address] [place], [place] [address]    Report Status 10/12/2021 FINAL  Final  Culture, blood (Routine X 2) w Reflex to ID Panel     Status: None   Collection Time: 10/07/21  1:55 AM   Specimen: BLOOD LEFT  HAND  Result Value Ref Range Status   Specimen Description BLOOD LEFT HAND  Final   Special Requests IN PEDIATRIC BOTTLE Blood Culture adequate volume  Final   Culture   Final    NO GROWTH 5 DAYS Performed at [facility], [place] [address], [place], [place] [address]    Report Status 10/12/2021 FINAL  Final    Procedures and diagnostic studies:  No results found.             LOS: 11 days   [place] [employer], advertising on [email or web address]. If 7PM-7AM, please contact night-coverage at [email or web address]     10/16/2021, 1:36 PM

## 2021-10-16 NOTE — Progress Notes (Signed)
Occupational Therapy Treatment Patient Details Name: Cody Austin MRN: [id number] DOB: 1951/01/17 Today's Date: 10/16/2021   History of present illness pt is a 71 y/o male presenting 6/19 as a transfer from [facility] ED with hemorrhagic shock-- weakness, confusion and low CBG/Hgb, where he is scheduled to undergo attempted portal vein angioplasty/stenting and shunt embolization on 6/30. 6/21 EGD with no focal source of bleed/s/p homostatic spray, 6/23 splenorenal shunt emboliztion and TIPS creation, Paracentesis with 3L removed.  6/24 off pressors, 6/25 passed swallow eval. PMH: NASH, thrombocytopenia, diffuse coronary disease, turndown for liver transplant due to coronary disease, not a candidate for CABG or intervention due to complications of [name] (thrombocytopenia) and currently on medical therapy alone despite recent non-ST elevation MI.   OT comments  Patient completed supine to sitting EOB with mod A x2, sit to stand mod A x2 and completed stand pivot to [facility] using RW with mod A x2.  Patient proceeded to have BM and required total A for peri hygiene and clothing mgmt.  Patient educated on importance of further therapy to increased strength and ADL transfers to decrease weakness and to address decreased A from caregivers.   Recommendations for follow up therapy are one component of a multi-disciplinary discharge planning process, led by the attending physician.  Recommendations may be updated based on patient status, additional functional criteria and insurance authorization.    Follow Up Recommendations  Skilled nursing-short term rehab (<3 hours/day)    Assistance Recommended at Discharge Frequent or constant Supervision/Assistance  Patient can return home with the following  Two people to help with walking and/or transfers;Two people to help with bathing/dressing/bathroom;Assistance with cooking/housework;Direct supervision/assist for medications management;Direct supervision/assist for  financial management;Assist for transportation;Help with stairs or ramp for entrance   Equipment Recommendations       Recommendations for Other Services      Precautions / Restrictions Precautions Precautions: Fall Precaution Comments: foley Restrictions Weight Bearing Restrictions: No       Mobility Bed Mobility Overal bed mobility: Needs Assistance   Rolling: Mod assist Sidelying to sit: +2 for physical assistance, Mod assist, HOB elevated   Sit to supine: Max assist, +2 for physical assistance Sit to sidelying: Mod assist, +2 for physical assistance General bed mobility comments: cues for log roll sequencing, pt slow to initiate.    Transfers Overall transfer level: Needs assistance Equipment used: Rolling walker (2 wheels) Transfers: Sit to/from Stand, Bed to chair/wheelchair/BSC Sit to Stand: +2 physical assistance, From elevated surface, Min assist Stand pivot transfers: Mod assist, +2 physical assistance, +2 safety/equipment   Step pivot transfers: From elevated surface, Min assist, +2 safety/equipment     General transfer comment: cues for hand placement, assist forward and with boost, stability assist in standing during peri care and during pivotal step transfers bed to [facility] to bed. Pt refusing to sit up in chair after toileting.     Balance Overall balance assessment: Needs assistance Sitting-balance support: Bilateral upper extremity supported, No upper extremity supported Sitting balance-Leahy Scale: Fair     Standing balance support: Bilateral upper extremity supported, Reliant on assistive device for balance   Standing balance comment: reliant on external support or hand to stationary surface.                           ADL either performed or assessed with clinical judgement   ADL Overall ADL's : Needs assistance/impaired Eating/Feeding: Moderate assistance;Bed level   Grooming: Moderate assistance;Bed level  Upper Body Bathing:  Moderate assistance;Bed level   Lower Body Bathing: Maximal assistance;+2 for safety/equipment;+2 for physical assistance;Bed level   Upper Body Dressing : Moderate assistance;Bed level   Lower Body Dressing: Maximal assistance;+2 for safety/equipment;+2 for physical assistance;Bed level   Toilet Transfer: Moderate assistance;Stand-pivot;+2 for physical assistance;+2 for safety/equipment   Toileting- Clothing Manipulation and Hygiene: Maximal assistance;+2 for physical assistance;+2 for safety/equipment;Bed level       Functional mobility during ADLs: Moderate assistance;Minimal assistance;+2 for physical assistance      Extremity/Trunk Assessment Upper Extremity Assessment Upper Extremity Assessment: Generalized weakness            Vision   Vision Assessment?: No apparent visual deficits   Perception     Praxis      Cognition Arousal/Alertness: Awake/alert Behavior During Therapy: WFL for tasks assessed/performed Overall Cognitive Status: No family/caregiver present to determine baseline cognitive functioning                                 General Comments: increased processing time needed; pt appearing alert and oriented to time, situation and health prognosis, decreased awareness of bowel/bladder in supine and participatory after instruction on benefits of mobility.        Exercises      Shoulder Instructions       General Comments SpO2 WFL on 3L O2 [place], no dizziness reported with transfers, pt DOE 3/4 with minimal mobility    Pertinent Vitals/ Pain       Pain Assessment Faces Pain Scale: Hurts a little bit Pain Location: generalized with movement Pain Descriptors / Indicators: Discomfort, Grimacing  Home Living                                          Prior Functioning/Environment              Frequency  Min 2X/week        Progress Toward Goals  OT Goals(current goals can now be found in the care plan  section)  Progress towards OT goals: Progressing toward goals  Acute Rehab OT Goals OT Goal Formulation: With patient Time For Goal Achievement: 10/28/21 Potential to Achieve Goals: Fair ADL Goals Pt Will Perform Grooming: with set-up;sitting Pt Will Perform Lower Body Bathing: with mod assist;sitting/lateral leans;sit to/from stand Pt Will Perform Lower Body Dressing: with mod assist;sitting/lateral leans;sit to/from stand Pt Will Transfer to Toilet: with mod assist;ambulating Pt Will Perform Toileting - Clothing Manipulation and hygiene: with mod assist;sitting/lateral leans;sit to/from stand Additional ADL Goal #1: Pt will complete bed mobility with min A as a precursor for seated ADL's.  Plan Discharge plan remains appropriate    Co-evaluation    PT/OT/SLP Co-Evaluation/Treatment: Yes Reason for Co-Treatment: Complexity of the patient's impairments (multi-system involvement) PT goals addressed during session: Mobility/safety with mobility OT goals addressed during session: ADL's and self-care      AM-PAC OT "6 Clicks" Daily Activity     Outcome Measure   Help from another person eating meals?: A Lot Help from another person taking care of personal grooming?: A Lot Help from another person toileting, which includes using toliet, bedpan, or urinal?: Total Help from another person bathing (including washing, rinsing, drying)?: A Lot Help from another person to put on and taking off regular upper body clothing?: A Lot Help from another person  to put on and taking off regular lower body clothing?: Total 6 Click Score: 10    End of Session Equipment Utilized During Treatment: Oxygen;Gait belt;Rolling walker (2 wheels)  OT Visit Diagnosis: Muscle weakness (generalized) (M62.81)   Activity Tolerance Patient tolerated treatment well   Patient Left in bed;with call bell/phone within reach   Nurse Communication Mobility status        Time: 3403-5248 OT Time Calculation  (min): 17 min  Charges: OT Treatments $Self Care/Home Management : 8-22 mins  Mervyn Skeeters OTR/L  Hadley Pen 10/16/2021, 3:37 PM

## 2021-10-16 NOTE — Progress Notes (Signed)
Palliative Medicine Inpatient Follow Up Note  HPI: 71 y.o. male  with past medical history of [name] cirrhosis requiring paracenteses approximately every 2 weeks, ?  Hepatocellular carcinoma, hepatorenal syndrome, esophageal varices, portal hypertensive gastropathy, sleep apnea, and CAD admitted on 10/05/2021 with weakness, confusion, and anemia.  Patient found to have acute on chronic blood loss anemia and hemorrhagic shock.  Required transfusions of multiple blood products.  Patient also with bacteremia.  Patient had splenorenal shunt embolization and TIPS creation 6/24.  Patient is not a liver transplant candidate due to CAD.  PMT consulted to discuss goals of care.  Today's Discussion 10/16/2021  *Please note that this is a verbal dictation therefore any spelling or grammatical errors are due to the "[place] [employer]" system interpretation.  Chart reviewed inclusive of vital signs, progress notes, laboratory results, and diagnostic images.   I met with Rashaun and his wife [name] at bedside this late afternoon.  We reviewed Jawan's present health state and the severity of his [name] cirrhosis.  A summary of the conversations held yesterday was complete in terms of the differences between skilled nursing facilities, health for transitioning home, and now for transitioning to a hospice home.  Daeshon and his wife both have discussed this as well as amongst their children.  At this point in time [place] feels transitioning to an inpatient hospice ([facility]) would be the best option.  He shares that he is "sick of everything".  He describes the various things that he is sick of including polypharmacy, the perceived burden he is placing on his family, and his recurrent medical issues.  He expresses readiness to leave. Created space and opportunity for patient to explore thoughts feelings and fears regarding current medical situation.  I described the differences between continuing the current  level of care and transitioning to full comfort oriented care in the hospital.We talked about transition to comfort measures in house and what that would entail inclusive of medications to control pain, dyspnea, agitation, nausea, itching, and hiccups.  We discussed stopping all uneccessary measures such as cardiac monitoring, blood draws, needle sticks, and frequent vital signs. Utilized reflective listening throughout our time together.   At this time the decision has been made for Delrae Clarke to transition focus to comfort oriented care.  We reviewed that an inquiry will be sent to inpatient hospice though bed availability is variable.  I shared that Blaize would not leave the hospital until we had a safe discharge plan.  Questions and concerns addressed   Palliative Support Provided  Objective Assessment: Vital Signs Vitals:   10/16/21 0746 10/16/21 1215  BP: (!) 113/52 116/60  Pulse: 79 77  Resp: 20 17  Temp: 97.6 F (36.4 C) 98 F (36.7 C)  SpO2: 90% 96%    Intake/Output Summary (Last 24 hours) at 10/16/2021 1551 Last data filed at 10/16/2021 1414 Gross per 24 hour  Intake 400 ml  Output 4400 ml  Net -4000 ml   Last Weight  Most recent update: 10/16/2021  7:34 AM    Weight  107.2 kg (236 lb 5.3 oz)            Gen:  Caucasian M in NAD HEENT: Jaundiced sclera, dry mucous membranes CV: Regular rate and rhythm  PULM: On 3LPM [place] ABD: distended EXT: Generalized edema Neuro: Alert and oriented x3  SUMMARY OF RECOMMENDATIONS   DNAR/DNI  Transition focus to comfort  We will add low-dose as needed medications  We will stop medications that  add to pill burden --> Will DC Lasix/Spironolactone/Rifaximin/Lactulose upon transition to Hospice  We will discontinue telemetry monitoring  Unrestricted family visitation  Appreciate chaplaincy support  Transitions of care team have made a referral to hosp[facility] of [facility]  Ongoing palliative care while in  house  Exceptionally limited prognosis in the setting of end-stage [name] cirrhosis - prognosis < 2 weeks  Billing based on MDM: High  Problems Addressed: One acute or chronic illness or injury that poses a threat to life or bodily function  Amount and/or Complexity of Data: Category 3:Discussion of management or test interpretation with external physician/other qualified health care professional/appropriate source (not separately reported)  Risks: Decision not to resuscitate or to de-escalate care because of poor prognosis ______________________________________________________________________________________ [place] Team Team Cell Phone: [phone] Please utilize secure chat with additional questions, if there is no response within 30 minutes please call the above phone number  Palliative Medicine Team providers are available by phone from 7am to 7pm daily and can be reached through the team cell phone.  Should this patient require assistance outside of these hours, please call the patient's attending physician.

## 2021-10-16 NOTE — Progress Notes (Signed)
SLP Cancellation Note  Patient Details Name: Cody Austin MRN: [id number] DOB: 1950-07-19   Cancelled treatment:       Reason Eval/Treat Not Completed: SLP screened, no needs identified, will sign off (Pt has been transitioned to comfort care and SLP orders have been cancelled by PMT.)  Londen Lorge I. Hardin Negus, [place], [place] Office number [phone] Pager [place] 10/16/2021, 4:25 PM

## 2021-10-16 NOTE — Plan of Care (Signed)

## 2021-10-16 NOTE — Progress Notes (Signed)
Patient and family members refuse discontinuation of foley catheter at this time due to consideration for patient being dependent and consideration for palliative care. MD made aware.

## 2021-10-16 NOTE — Progress Notes (Signed)
Physical Therapy Treatment Patient Details Name: Cody Austin MRN: [id number] DOB: 10/27/1950 Today's Date: 10/16/2021   History of Present Illness pt is a 71 y/o male presenting 6/19 as a transfer from [facility] ED with hemorrhagic shock-- weakness, confusion and low CBG/Hgb, where he is scheduled to undergo attempted portal vein angioplasty/stenting and shunt embolization on 6/30. 6/21 EGD with no focal source of bleed/s/p homostatic spray, 6/23 splenorenal shunt emboliztion and TIPS creation, Paracentesis with 3L removed.  6/24 off pressors, 6/25 passed swallow eval. PMH: NASH, thrombocytopenia, diffuse coronary disease, turndown for liver transplant due to coronary disease, not a candidate for CABG or intervention due to complications of [name] (thrombocytopenia) and currently on medical therapy alone despite recent non-ST elevation MI.    PT Comments    Pt received in supine, agreeable to therapy session after prolonged discussion as to benefits of mobility to maintain endurance and relieve pressure, pt and family present for instruction. Pt needing up to +2 modA for functional mobility tasks including transfer to/from [facility] using RW with greatly increased time/cues to initiate and perform all tasks. Pt bed linens changed due to bowel incontinence. Pt with increased weeping edema to UE/LE and trunk, positioned with BUE elevated and Prevalon boots donned for edema relief/skin protection and comfort at end of session. Pt continues to benefit from PT services to progress toward functional mobility goals.   Recommendations for follow up therapy are one component of a multi-disciplinary discharge planning process, led by the attending physician.  Recommendations may be updated based on patient status, additional functional criteria and insurance authorization.  Follow Up Recommendations  Skilled nursing-short term rehab (<3 hours/day)     Assistance Recommended at Discharge Frequent or constant  Supervision/Assistance  Patient can return home with the following A little help with walking and/or transfers;A little help with bathing/dressing/bathroom;Assistance with cooking/housework;Assist for transportation;Help with stairs or ramp for entrance   Equipment Recommendations  Rolling walker (2 wheels) (TBD)    Recommendations for Other Services       Precautions / Restrictions Precautions Precautions: Fall Precaution Comments: foley Restrictions Weight Bearing Restrictions: No     Mobility  Bed Mobility Overal bed mobility: Needs Assistance Bed Mobility: Rolling, Sidelying to Sit, Sit to Sidelying Rolling: Mod assist Sidelying to sit: +2 for physical assistance, Mod assist, HOB elevated     Sit to sidelying: Mod assist, +2 for physical assistance General bed mobility comments: cues for log roll sequencing, pt slow to initiate.    Transfers Overall transfer level: Needs assistance Equipment used: Rolling walker (2 wheels) Transfers: Sit to/from Stand, Bed to chair/wheelchair/BSC Sit to Stand: +2 physical assistance, From elevated surface, Min assist   Step pivot transfers: From elevated surface, Min assist, +2 safety/equipment       General transfer comment: cues for hand placement, assist forward and with boost, stability assist in standing during peri care and during pivotal step transfers bed to [facility] to bed. Pt refusing to sit up in chair after toileting.    Ambulation/Gait             Pre-gait activities: pt dyspneic to 3/4 with stand pivot transfers and reports 6/10 modified RPE after transfers, defers gait at this time.        Balance Overall balance assessment: Needs assistance Sitting-balance support: Bilateral upper extremity supported, No upper extremity supported Sitting balance-Leahy Scale: Fair     Standing balance support: Bilateral upper extremity supported, Reliant on assistive device for balance Standing balance-Leahy Scale:  Poor  Standing balance comment: reliant on external support or hand to stationary surface.                            Cognition Arousal/Alertness: Awake/alert Behavior During Therapy: WFL for tasks assessed/performed Overall Cognitive Status: No family/caregiver present to determine baseline cognitive functioning                                 General Comments: increased processing time needed; pt appearing alert and oriented to time, situation and health prognosis, decreased awareness of bowel/bladder in supine and participatory after instruction on benefits of mobility.           General Comments General comments (skin integrity, edema, etc.): SpO2 WFL on 3L O2 [place], no dizziness reported with transfers, pt DOE 3/4 with minimal mobility      Pertinent Vitals/Pain Pain Assessment Pain Assessment: Faces Faces Pain Scale: Hurts a little bit Pain Location: generalized with movement Pain Descriptors / Indicators: Discomfort, Grimacing Pain Intervention(s): Monitored during session, Repositioned, Limited activity within patient's tolerance     PT Goals (current goals can now be found in the care plan section) Acute Rehab PT Goals PT Goal Formulation: Patient unable to participate in goal setting Time For Goal Achievement: 10/28/21 Progress towards PT goals: Progressing toward goals    Frequency    Min 3X/week      PT Plan Current plan remains appropriate    Co-evaluation PT/OT/SLP Co-Evaluation/Treatment: Yes Reason for Co-Treatment: Complexity of the patient's impairments (multi-system involvement);To address functional/ADL transfers PT goals addressed during session: Mobility/safety with mobility;Proper use of DME;Balance;Strengthening/ROM        AM-PAC PT "6 Clicks" Mobility   Outcome Measure  Help needed turning from your back to your side while in a flat bed without using bedrails?: A Lot Help needed moving from lying on your back to  sitting on the side of a flat bed without using bedrails?: A Lot Help needed moving to and from a bed to a chair (including a wheelchair)?: A Lot Help needed standing up from a chair using your arms (e.g., wheelchair or bedside chair)?: A Lot Help needed to walk in hospital room?: Total Help needed climbing 3-5 steps with a railing? : Total 6 Click Score: 10    End of Session Equipment Utilized During Treatment: Oxygen Activity Tolerance: Patient tolerated treatment well;Patient limited by fatigue Patient left: in bed;with call bell/phone within reach;with bed alarm set;with family/visitor present;Other (comment) (air boots on, BUE elevated for edema mgmt, HOB >30* to reduce aspiration risk) Nurse Communication: Mobility status;Other (comment) (pt had BM, needs to be dumped) PT Visit Diagnosis: Other abnormalities of gait and mobility (R26.89);Muscle weakness (generalized) (M62.81)     Time: 1330-1416 PT Time Calculation (min) (ACUTE ONLY): 46 min  Charges:  $Therapeutic Activity: 23-37 mins                     Honestie Kulik P., PTA Acute Rehabilitation Services Secure Chat Preferred 9a-5:30pm Office: [place] 10/16/2021, 3:10 PM

## 2021-10-16 NOTE — TOC Progression Note (Signed)
Transition of Care [facility]) - Progression Note    Patient Details  Name: Cody Austin MRN: [id number] Date of Birth: 16-Jul-1950  Transition of Care [facility]) CM/SW [place], RN Phone Number: 10/16/2021, 3:39 PM  Clinical Narrative:    Discussed with Helaine Chess NP, patient would like to go  to [place]. Called and left message with Vito Backers and will fax over demographics and summaries to [phone]     Barriers to Discharge: No Barriers Identified  Expected Discharge Plan and [facility] hosp[facility]                                           Social Determinants of Health (SDOH) Interventions    Readmission Risk Interventions     No data to display

## 2021-10-17 DIAGNOSIS — Z7189 Other specified counseling: Secondary | ICD-10-CM | POA: Diagnosis not present

## 2021-10-17 DIAGNOSIS — R188 Other ascites: Secondary | ICD-10-CM | POA: Diagnosis not present

## 2021-10-17 DIAGNOSIS — N179 Acute kidney failure, unspecified: Secondary | ICD-10-CM | POA: Diagnosis not present

## 2021-10-17 DIAGNOSIS — K922 Gastrointestinal hemorrhage, unspecified: Secondary | ICD-10-CM | POA: Diagnosis not present

## 2021-10-17 DIAGNOSIS — Z515 Encounter for palliative care: Secondary | ICD-10-CM | POA: Diagnosis not present

## 2021-10-17 NOTE — Plan of Care (Signed)

## 2021-10-17 NOTE — Progress Notes (Signed)
Progress Note    Cody Austin  PYK:[id number] DOB: [birth year]  DOA: 10/05/2021 PCP: Rogene Houston, MD      Brief Narrative:    Medical records reviewed and are as summarized below:  Cody Austin is a 71 y.o. male   with NASH cirrhosis  receiving paracentesis approximately every 2 weeks yielding 4.5 most recently on 09/30/21 with w/u c/w main portal vein stenosis likely secondary to chronic non-occlusive thrombus, a massive splenorenal shunt, advanced cirrhosis, hepatocellular carcinoma, hepatorenal syndrome, and hyperbilirubinemia.  He was recently admitted to [facility] with acute anemia and melena, to complicate his case further, and underwent EGD (09/23/21) which showed grade 1 esophageal varices and moderate diffuse portal hypertensive gastropathy with antral bleeding that was controlled with hemostatic spray.  He required blood transfusion during this admission.    He presented on the morning of 10/05/2021 to [facility] ER with weakness, confusion  and low cbg/ hgb and PCCM consulted re transfer to [facility] where he is scheduled to undergo attempting portal vein angioplasty/stenting and shunt embolization on 5/39 with main complication = florid terminal liver failure per IR's note 10/01/21 and pt agreed as alternative to repeat massive paracentesis.     Pertinent  Medical History  NASh cirrhosis complicated by recurrent ascites, portal vein stenosis secondary to chronic nonocclusive thrombus, splenorenal shunt, hepatocellular carcinoma, hepatorenal syndrome, grade 1 varices, portal hypertensive gastropathy, OSA   Significant Hospital Events: Including procedures, antibiotic start and stop dates in addition to other pertinent events   6/19 Presented to [facility] and transferred [facility] 6/21 EGD with no focal source of bleed. Likely secondary to [place]. S/p hemostatic spray 6/23 S/p splenorenal shunt embolization and TIPS creation with reduction of flow by 90%. Paracentesis performed with 3L  removed 6/24 Weaned off pressors. Advanced to clear liquid diet, 6/25 Swallow Eval>> started on Dysphagia 3 , thin liquids     10/17/2021: Patient seen alongside patient's wife, sister and brother-in-law.  Patient is now comfort directed care.  Patient is awaiting inpatient hospice bed.  Patient remains comfortable. 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  (Obesity)   Hemorrhagic shock/acute on chronic blood loss anemia with coagulopathy and thrombocytopenia- Shock resolved.  S/p transfusion 9 units of PRBCs S/p transfusion with 8 units of fresh frozen plasma S/p transfusion with 9 units of platelets pheresis Melena/upper GI bleeding EGD 6/21 with no focal source of bleed.  EGD showed erosive gastropathy, clotted blood in the stomach, portal hypertensive gastropathy/gastritis Coagulopathy from liver cirrhosis-INR is better  End stage NASH cirrhosis with recurrent ascites s/p splenorenal shunt embolization and TIPS creation 6/24- not transplant candidate due to CAD hx, followed by [facility] Possible HCC - not a candidate for chemo or TACE Hepatorenal syndrome Volume overload/anasarca on IV Lasix Klebsiella pneumonia bacteremia-completed 10-day course of IV Ancef on 10/15/2021. CAD, s/p NSTEMI in setting of anemia and shock not intervention candidate Severe aortic stenosis-  not intervention candidate Hypoglycemia, recurrent-likely from cirrhosis and poor oral intake Dysphagia-continue  dysphagia 3 diet as tolerated Hyperkalemia-improved.  Restart Aldactone   Prognosis is poor.  Patient is now comfort directed care..   Diet Order             DIET DYS 3 Room service appropriate? Yes; Fluid consistency: Thin  Diet effective now                  Consultants: Intensivist, interventional radiologist, gastroenterologist, palliative care  Procedures: TIPS creation, splenorenal shunt embolization and portal vein angioplasty on 10/09/2021 EGD on 10/07/2021 Intubation on 10/07/2021, extubated on 10/09/2021    Medications:    pantoprazole  40 mg Oral BID   Continuous Infusions:     Anti-infectives (From admission, onward)    Start     Dose/Rate Route Frequency Ordered Stop   10/10/21 1245  rifaximin (XIFAXAN) tablet 550 mg  Status:  Discontinued        550 mg Oral 2 times daily 10/10/21 1147 10/16/21 1615   10/10/21 0200  ceFAZolin (ANCEF) IVPB 2g/100 mL premix        2 g 200 mL/hr over 30 Minutes Intravenous Every 8 hours 10/09/21 2031 10/15/21 0844   10/09/21 0000  cefTRIAXone (ROCEPHIN) 2 g in sodium chloride 0.9 % 100 mL IVPB        2 g 200 mL/hr over 30 Minutes Intravenous To Radiology 10/08/21 1248 10/09/21 0220   10/08/21 2200  rifaximin (XIFAXAN) tablet 550 mg  Status:  Discontinued        550 mg Oral 2 times daily 10/08/21 1732 10/08/21 1733   10/07/21 1800  ceFAZolin (ANCEF) IVPB 2g/100 mL premix  Status:  Discontinued        2 g 200 mL/hr over 30 Minutes Intravenous Every 12 hours 10/07/21 0943 10/09/21 2031   10/06/21 1800  cefTRIAXone (ROCEPHIN) 2 g in sodium chloride 0.9 % 100 mL IVPB  Status:  Discontinued        2 g 200 mL/hr over 30 Minutes Intravenous Every 24 hours 10/06/21 0017 10/07/21 0943   10/06/21 1200  ceFEPIme (MAXIPIME) 2 g in sodium chloride 0.9 % 100 mL IVPB  Status:  Discontinued        2 g 200 mL/hr over 30 Minutes Intravenous Every 24 hours 10/05/21 1805 10/05/21 1835   10/06/21 1200  ceFEPIme (MAXIPIME) 2 g in sodium  chloride 0.9 % 100 mL IVPB  Status:  Discontinued        2 g 200 mL/hr over 30 Minutes Intravenous Every 24 hours 10/05/21 2218 10/06/21 0017   10/06/21 0945  vancomycin (VANCOREADY) IVPB 1250 mg/250 mL  Status:  Discontinued       See Hyperspace for full Linked Orders Report.   1,250 mg 166.7 mL/hr over 90 Minutes Intravenous Every 24 hours 10/05/21 0847 10/05/21 1805   10/05/21 2200  ceFEPIme (MAXIPIME) 2 g in sodium chloride 0.9 % 100 mL IVPB  Status:  Discontinued        2 g 200 mL/hr over 30 Minutes Intravenous Every 12 hours 10/05/21 0847 10/05/21 1805   10/05/21 2200  rifaximin (XIFAXAN) tablet 550 mg  Status:  Discontinued        550 mg Oral 2 times daily 10/05/21 1829 10/07/21 0940   10/05/21 2000  cefTRIAXone (ROCEPHIN) 2 g in sodium chloride 0.9 % 100 mL IVPB  Status:  Discontinued        2 g 200 mL/hr over  30 Minutes Intravenous Every 24 hours 10/05/21 1835 10/05/21 2218   10/05/21 1805  vancomycin variable dose per unstable renal function (pharmacist dosing)  Status:  Discontinued         Does not apply See admin instructions 10/05/21 1805 10/05/21 1835   10/05/21 0935  vancomycin (VANCOREADY) IVPB 500 mg/100 mL       See Hyperspace for full Linked Orders Report.   500 mg 100 mL/hr over 60 Minutes Intravenous  Once 10/05/21 0847 10/05/21 1043   10/05/21 0830  ceFEPIme (MAXIPIME) 2 g in sodium chloride 0.9 % 100 mL IVPB        2 g 200 mL/hr over 30 Minutes Intravenous  Once 10/05/21 0823 10/05/21 1247   10/05/21 0830  metroNIDAZOLE (FLAGYL) IVPB 500 mg        500 mg 100 mL/hr over 60 Minutes Intravenous  Once 10/05/21 0823 10/05/21 1338   10/05/21 0830  vancomycin (VANCOCIN) IVPB 1000 mg/200 mL premix        1,000 mg 200 mL/hr over 60 Minutes Intravenous  Once 10/05/21 6468 10/05/21 0935              Family Communication/Anticipated D/C date and plan/Code Status   DVT prophylaxis:      Code Status: DNR  Family Communication: None Disposition Plan: To be  determined   Status is: Inpatient Remains inpatient appropriate because: IV Lasix       Subjective:   Interval events noted.  He has no complaints.  He said he cannot describe how he feels.  No pain.  Objective:    Vitals:   10/16/21 0500 10/16/21 0746 10/16/21 1215 10/16/21 2000  BP:  (!) 113/52 116/60 121/63  Pulse:  79 77 76  Resp:  20 17 14   Temp:  97.6 F (36.4 C) 98 F (36.7 C) 98.1 F (36.7 C)  TempSrc:  Axillary Oral Oral  SpO2:  90% 96% 100%  Weight: 107.2 kg     Height:       No data found.   Intake/Output Summary (Last 24 hours) at 10/17/2021 1713 Last data filed at 10/17/2021 1600 Gross per 24 hour  Intake 1870 ml  Output 2175 ml  Net -305 ml    Filed Weights   10/13/21 0500 10/14/21 0500 10/16/21 0500  Weight: 107.5 kg 106.4 kg 107.2 kg    Exam:  GEN: NAD SKIN: Jaundice.  Bruises on upper and lower extremities EYES: Icteric, pale ENT: MMM, hearing impairment CV: RRR PULM: CTA B ABD: soft, distended, NT, +BS CNS: AAO x 3, non focal EXT: Significant bilateral lower extremity edema from the thighs to the feet, no tenderness GU: Foley catheter draining orange-looking urine    Pressure Injury 10/13/21 Buttocks Left Stage 2 -  Partial thickness loss of dermis presenting as a shallow open injury with a red, pink wound bed without slough. (Active)  10/13/21 0000  Location: Buttocks  Location Orientation: Left  Staging: Stage 2 -  Partial thickness loss of dermis presenting as a shallow open injury with a red, pink wound bed without slough.  Wound Description (Comments):   Present on Admission:   Dressing Type Foam - Lift dressing to assess site every shift 10/16/21 2000     Data Reviewed:   I have personally reviewed following labs and imaging studies:  Labs: Labs show the following:   Basic Metabolic Panel: Recent Labs  Lab 10/12/21 2137 10/13/21 0450 10/13/21 0500 10/14/21 0117 10/15/21 0112 10/16/21 0445  NA 139  --  142  141 141 139  K 3.4*  --  3.6 4.5 5.5* 4.0  CL 106  --  108 108 109 103  CO2 26  --  27 25 19* 26  GLUCOSE 116*  --  95 95 100* 88  BUN 80*  --  76* 74* 83* 82*  CREATININE 1.42*  --  1.48* 1.50* 1.40* 1.54*  CALCIUM 7.7*  --  7.9* 7.9* 8.3* 8.1*  MG  --  1.7  --  2.0 2.1 1.9  PHOS  --   --   --   --  3.9 4.2    GFR Estimated Creatinine Clearance: 52.2 mL/min (A) (by C-G formula based on SCr of 1.54 mg/dL (H)). Liver Function Tests: Recent Labs  Lab 10/12/21 0322 10/12/21 2137 10/13/21 0500 10/14/21 0117 10/16/21 0445  AST 81* 72* 70* 67* 66*  ALT 35 23 21 17 19   ALKPHOS 68 77 75 90 89  BILITOT 12.4* 13.2* 14.8* 16.6* 18.9*  PROT 3.6* 3.5* 3.6* 3.6* 3.7*  ALBUMIN 1.9* 1.8* 1.8* 1.7* 1.7*    No results for input(s): "LIPASE", "AMYLASE" in the last 168 hours. No results for input(s): "AMMONIA" in the last 168 hours. Coagulation profile Recent Labs  Lab 10/11/21 0338 10/11/21 1800 10/12/21 0322 10/13/21 0500 10/16/21 0445  INR 3.4* 3.0* 3.2* 3.2* 2.9*     CBC: Recent Labs  Lab 10/11/21 1800 10/12/21 0322 10/13/21 0500 10/14/21 0340 10/15/21 1039 10/16/21 0445  WBC 12.9*  --  16.5* 21.1* 19.8* 17.7*  NEUTROABS  --   --  14.3* 18.4* 16.6* 14.2*  HGB 7.5*  --  7.5* 7.0* 7.5* 7.6*  HCT 21.8*  --  22.8* 21.5* 22.4* 23.1*  MCV 94.4  --  97.9 100.9* 98.2 100.4*  PLT 48*  49* 33* 30*  31* 29* 31* 33*    Cardiac Enzymes: No results for input(s): "CKTOTAL", "CKMB", "CKMBINDEX", "TROPONINI" in the last 168 hours. BNP (last 3 results) No results for input(s): "PROBNP" in the last 8760 hours. CBG: Recent Labs  Lab 10/15/21 2157 10/15/21 2331 10/16/21 0353 10/16/21 0747 10/16/21 1216  GLUCAP 76 100* 93 63* 98    D-Dimer: No results for input(s): "DDIMER" in the last 72 hours.  Hgb A1c: No results for input(s): "HGBA1C" in the last 72 hours. Lipid Profile: No results for input(s): "CHOL", "HDL", "LDLCALC", "TRIG", "CHOLHDL", "LDLDIRECT" in the last 72  hours. Thyroid function studies: No results for input(s): "TSH", "T4TOTAL", "T3FREE", "THYROIDAB" in the last 72 hours.  Invalid input(s): "FREET3" Anemia work up: No results for input(s): "VITAMINB12", "FOLATE", "FERRITIN", "TIBC", "IRON", "RETICCTPCT" in the last 72 hours. Sepsis Labs: Recent Labs  Lab 10/13/21 0500 10/14/21 0340 10/15/21 1039 10/16/21 0445  WBC 16.5* 21.1* 19.8* 17.7*     Microbiology Recent Results (from the past 240 hour(s))  SARS Coronavirus 2 by RT PCR (hospital order, performed in [facility] hospital lab) *cepheid single result test* Anterior Nasal Swab     Status: None   Collection Time: 10/16/21  4:12 PM   Specimen: Anterior Nasal Swab  Result Value Ref Range Status   SARS Coronavirus 2 by RT PCR NEGATIVE NEGATIVE Final    Comment: (NOTE) SARS-CoV-2 target nucleic acids are NOT DETECTED.  The SARS-CoV-2 RNA is generally detectable in upper and lower respiratory specimens during the acute phase of infection. The lowest concentration of SARS-CoV-2 viral copies this assay can detect is 250 copies / mL. A negative result does not preclude SARS-CoV-2 infection and should not  be used as the sole basis for treatment or other patient management decisions.  A negative result may occur with improper specimen collection / handling, submission of specimen other than nasopharyngeal swab, presence of viral mutation(s) within the areas targeted by this assay, and inadequate number of viral copies (<250 copies / mL). A negative result must be combined with clinical observations, patient history, and epidemiological information.  Fact Sheet for Patients:   https://www.patel.info/  Fact Sheet for Healthcare Providers: https://hall.com/  This test is not yet approved or  cleared by the [place] FDA and has been authorized for detection and/or diagnosis of SARS-CoV-2 by FDA under an Emergency Use Authorization  (EUA).  This EUA will remain in effect (meaning this test can be used) for the duration of the COVID-19 declaration under Section 564(b)(1) of the Act, 21 U.S.C. section 360bbb-3(b)(1), unless the authorization is terminated or revoked sooner.  Performed at [facility], [place] [address], [place], [place] [address]     Procedures and diagnostic studies:  No results found.      LOS: 12 days   [place] [facility]   Pager on [email or web address]. If 7PM-7AM, please contact night-coverage at [email or web address]     10/17/2021, 5:13 PM

## 2021-10-17 NOTE — Progress Notes (Signed)
Palliative Medicine Inpatient Follow Up Note  HPI: 71 y.o. male  with past medical history of Cody Austin cirrhosis requiring paracenteses approximately every 2 weeks, ?  Hepatocellular carcinoma, hepatorenal syndrome, esophageal varices, portal hypertensive gastropathy, sleep apnea, and CAD admitted on 10/05/2021 with weakness, confusion, and anemia.  Patient found to have acute on chronic blood loss anemia and hemorrhagic shock.  Required transfusions of multiple blood products.  Patient also with bacteremia.  Patient had splenorenal shunt embolization and TIPS creation 6/24.  Patient is not a liver transplant candidate due to CAD.  PMT consulted to discuss goals of care.  Today's Discussion 10/17/2021  *Please note that this is a verbal dictation therefore any spelling or grammatical errors are due to the "[place]" system interpretation.  Chart reviewed inclusive of vital signs, progress notes, laboratory results, and diagnostic images.   I met with [name] this morning at bedside.  [name] shares that he is quite tired of the monotony of being in the hospital.  He is hopeful to get to [place] sooner than later.  We reviewed that they still need to review his chart and have a bed open and available for him to be able to transition.  We discussed that I would update him if that happens sooner than later today.  In terms of symptoms, Cody Austin shares that he does have some abdominal pain.  I reviewed the medications which have been initiated to help with his discomfort inclusive of his Dilaudid.  Cody Austin is ambivalent to take any strong pain medications as he shares he does not like medicine.  We reviewed the role of medication to aid in abate symptoms.  He vocalizes awareness.  I spoke to patient's wife, [name] over the phone and provided her an update that we still do not know if a bed at [facility] is available.  We reviewed that we may have to wait for an update from the case management  team.  [name] shares that the their daughter is also working on seeing if there is a bed.  Questions and concerns addressed   Palliative Support Provided  Objective Assessment: Vital Signs Vitals:   10/16/21 1215 10/16/21 2000  BP: 116/60 121/63  Pulse: 77 76  Resp: 17 14  Temp: 98 F (36.7 C) 98.1 F (36.7 C)  SpO2: 96% 100%    Intake/Output Summary (Last 24 hours) at 10/17/2021 1233 Last data filed at 10/17/2021 7793 Gross per 24 hour  Intake 1220 ml  Output 4175 ml  Net -2955 ml    Last Weight  Most recent update: 10/16/2021  7:34 AM    Weight  107.2 kg (236 lb 5.3 oz)            Gen:  Caucasian M in NAD HEENT: Jaundiced sclera, dry mucous membranes CV: Regular rate and rhythm  PULM: On 3LPM [place] ABD: distended EXT: Generalized edema Neuro: Alert and oriented x3  SUMMARY OF RECOMMENDATIONS   DNAR/DNI  Transition focus to comfort  Continue low-dose as needed medications  Have stopped medications that add to pill burden --> Will DC Lasix/Spironolactone/Rifaximin/Lactulose upon transition to Hospice  Unrestricted family visitation  Appreciate chaplaincy support  Transitions of care team have made a referral to hospice of [place]  Ongoing palliative care while in house  Exceptionally limited prognosis in the setting of end-stage Cody Austin cirrhosis - prognosis < 2 weeks  Billing based on MDM: High --> opioid review ______________________________________________________________________________________ Tacey Ruiz [facility]  Medicine Team Team Cell Phone: [phone] Please utilize secure chat with additional questions, if there is no response within 30 minutes please call the above phone number  Palliative Medicine Team providers are available by phone from 7am to 7pm daily and can be reached through the team cell phone.  Should this patient require assistance outside of these hours, please call the patient's  attending physician.

## 2021-10-17 NOTE — TOC Progression Note (Addendum)
Transition of Care [facility]) - Progression Note    Patient Details  Name: Cody Austin MRN: [id number] Date of Birth: 01/20/1951  Transition of Care [facility]) CM/SW Contact  Ina Homes, [place] Phone Number: 10/17/2021, 10:18 AM  Clinical Narrative:     SW spoke with Manuela Schwartz [facility] [phone]) reports she does not have pt's information in system but that does not mean they have not received it. Their intake does not come in until 11am today.   Update 1146am SW called [facility] again, spoke with Elane Fritz, who reports pt's information still not in system, requested clinicals faxed to [phone]  Update 100pm SW received call from [facility]) reports awaiting final determination from MD for approval  Update 348pm SW spoke with Fermin Schwab [facility]) reports still no decision. SW provided unit contact if decision is not made until after hours. Packet at [place] with DNR. PTAR will need to be called.   Barriers to Discharge: No Barriers Identified  Expected Discharge Plan and Services                                                 Social Determinants of Health (SDOH) Interventions    Readmission Risk Interventions     No data to display

## 2021-10-18 DIAGNOSIS — Z515 Encounter for palliative care: Secondary | ICD-10-CM | POA: Diagnosis not present

## 2021-10-18 LAB — SARS CORONAVIRUS 2 BY RT PCR: SARS Coronavirus 2 by RT PCR: NEGATIVE

## 2021-10-18 MED ORDER — POLYVINYL ALCOHOL 1.4 % OP SOLN
1.0000 [drp] | Freq: Four times a day (QID) | OPHTHALMIC | 0 refills | Status: AC | PRN
Start: 2021-10-18 — End: ?

## 2021-10-18 MED ORDER — SALINE SPRAY 0.65 % NA SOLN: 1.0000 | NASAL | 0 refills | Status: AC | PRN

## 2021-10-18 MED ORDER — GLYCOPYRROLATE 1 MG PO TABS: 1.0000 mg | ORAL_TABLET | ORAL | 0 refills | Status: AC | PRN

## 2021-10-18 NOTE — TOC Progression Note (Addendum)
Transition of Care [facility]) - Progression Note    Patient Details  Name: Cody Austin MRN: [id number] Date of Birth: June 16, 1950  Transition of Care [facility]) CM/SW Contact  Ina Homes, [place] Phone Number: 10/18/2021, 9:39 AM  Clinical Narrative:     SW left VM with Fermin Schwab [facility] [phone]) to f/u on MD approval.  Update 1115am SW attempted to call [facility] [phone]) directly but no answer, continues to ring with no option to leave VM.   Update 136pm SW received call from [place] [facility]) reports pt is approved for admission today. Call Report: [phone]   Barriers to Discharge: No Barriers Identified  Expected Discharge Plan and Services                                                 Social Determinants of Health (SDOH) Interventions    Readmission Risk Interventions     No data to display

## 2021-10-18 NOTE — TOC Transition Note (Signed)
Transition of Care [facility]) - CM/SW Discharge Note   Patient Details  Name: Cody Austin MRN: [id number] Date of Birth: 17-Jun-1950  Transition of Care [facility]) CM/SW Contact:  Ina Homes, [place] Phone Number: 10/18/2021, 1:42 PM   Clinical Narrative:     PTAR called. Packet at nursing station. Call Report: [phone]  Final next level of care: [place] Barriers to Discharge: No Barriers Identified   Patient Goals and CMS Choice        Discharge Placement              Patient chooses bed at:  [facility]) Patient to be transferred to facility by: [place] Name of family member notified: family at bedside Patient and family notified of of transfer: 10/18/21  Discharge Plan and Services                                     Social Determinants of Health ([place]) Interventions     Readmission Risk Interventions     No data to display

## 2021-10-18 NOTE — Progress Notes (Signed)
Report called to Manuela Schwartz at [facility]. Patient discharged with belongings, including his home cpap. Patient discharged with foley and IJ per hospice house request.

## 2021-10-18 NOTE — Discharge Summary (Signed)
Physician Discharge Summary  Patient ID: Cody Austin MRN: [id number] DOB/AGE: [birth year] 71 y.o.  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  Patient was admitted with weakness, confusion, low blood sugar and low hemoglobin.  6/19 Presented to [facility] and transferred [facility] 6/21 EGD with no focal source of bleed. Likely secondary to Twilight. S/p hemostatic spray 6/23 S/p splenorenal shunt embolization and TIPS creation with reduction of flow by 90%. Paracentesis performed with 3L removed 6/24 Weaned off pressors. Advanced to clear liquid diet, 6/25 Swallow Eval>> started on Dysphagia 3 , thin liquids   Hemorrhagic shock/acute on chronic blood loss anemia with coagulopathy and thrombocytopenia- Shock resolved.  S/p transfusion 9 units of PRBCs S/p transfusion with 8 units of fresh frozen plasma S/p transfusion with 9 units of platelets pheresis Melena/upper GI bleeding EGD 6/21 with no focal source of bleed.  EGD showed  erosive gastropathy, clotted blood in the stomach, portal hypertensive gastropathy/gastritis Coagulopathy from liver cirrhosis-INR is better  End stage NASH cirrhosis with recurrent ascites s/p splenorenal shunt embolization and TIPS creation 6/24- not transplant candidate due to CAD hx, followed by [facility] Possible HCC - not a candidate for chemo or TACE Hepatorenal syndrome Volume overload/anasarca on IV Lasix Klebsiella pneumonia bacteremia-completed 10-day course of IV Ancef on 10/15/2021. CAD, s/p NSTEMI in setting of anemia and shock not intervention candidate Severe aortic stenosis- not intervention candidate Hypoglycemia, recurrent-likely from cirrhosis and poor oral intake Dysphagia-continue dysphagia 3 diet as tolerated Hyperkalemia-improved.    Patient's prognosis is poor.  Comfort directed care has been elected.  Patient will be discharged to hospice house.   Consults: pulmonary/intensive care, GI, and palliative care team and interventional radiology   Discharge Exam: Blood pressure 121/63, pulse 76, temperature 98.1 F (36.7 C), temperature source Oral, resp. rate 14, height 5' 8"  (1.727 m), weight 107.2 kg, SpO2 100 %.   Disposition: Discharge disposition: 51-Hospice/Medical Facility       Discharge Instructions     Diet - low sodium heart healthy   Complete by: As directed    Discharge wound care:   Complete by: As directed    Continue current wound care plan.   Increase activity slowly   Complete by: As directed       Allergies as of 10/18/2021       Reactions   Albumin (human) Other (See Comments)   Chest pain with fast infusion: Patient requires that Albumin infusion be slowed to over 10 hours or longer and only 1 unit at a time to prevent reaction.  Medication List     STOP taking these medications    atorvastatin 20 MG tablet Commonly known as: LIPITOR   isosorbide mononitrate 30 MG 24 hr tablet Commonly known as: IMDUR    lactulose 10 GM/15ML solution Commonly known as: CHRONULAC   levothyroxine 75 MCG tablet Commonly known as: Synthroid   metoprolol tartrate 25 MG tablet Commonly known as: LOPRESSOR   OMEGA 3 500 PO   rifaximin 550 MG Tabs tablet Commonly known as: XIFAXAN   zinc gluconate 50 MG tablet       TAKE these medications    glycopyrrolate 1 MG tablet Commonly known as: ROBINUL Take 1 tablet (1 mg total) by mouth every 4 (four) hours as needed for up to 7 days (excessive secretions).   pantoprazole 40 MG tablet Commonly known as: PROTONIX Take 1 tablet (40 mg total) by mouth 2 (two) times daily before a meal.   polyvinyl alcohol 1.4 % ophthalmic solution Commonly known as: LIQUIFILM TEARS Place 1 drop into both eyes 4 (four) times daily as needed for dry eyes.   sodium chloride 0.65 % Soln nasal spray Commonly known as: OCEAN Place 1 spray into both nostrils as needed for congestion (dryness).               Discharge Care Instructions  (From admission, onward)           Start     Ordered   10/18/21 0000  Discharge wound care:       Comments: Continue current wound care plan.   10/18/21 1315            Follow-up Information     [facility]. Schedule an appointment as soon as possible for a visit.   Why: contact office to arrange follow up appt w Dr Jenetta Downer or other MD in practice as Dr Laural Golden has retired. Contact information: [address] [place] [phone]                Signed: Bonnell Public 10/18/2021, 1:15 PM

## 2021-10-18 NOTE — Progress Notes (Addendum)
   Palliative Medicine Inpatient Follow Up Note  HPI: 71 y.o. male  with past medical history of Cody Austin cirrhosis requiring paracenteses approximately every 2 weeks, ?  Hepatocellular carcinoma, hepatorenal syndrome, esophageal varices, portal hypertensive gastropathy, sleep apnea, and CAD admitted on 10/05/2021 with weakness, confusion, and anemia.  Patient found to have acute on chronic blood loss anemia and hemorrhagic shock.  Required transfusions of multiple blood products.  Patient also with bacteremia.  Patient had splenorenal shunt embolization and TIPS creation 6/24.  Patient is not a liver transplant candidate due to CAD.  PMT consulted to discuss goals of care.  Today's Discussion 10/18/2021  *Please note that this is a verbal dictation therefore any spelling or grammatical errors are due to the "[place] One" system interpretation.  Chart reviewed inclusive of vital signs, progress notes, laboratory results, and diagnostic images.   Per medication review no supplemental as needed's have been needed.  I met with Cody Austin this morning at bedside, I shared with him that unfortunately we are still awaiting physician approval at [facility].  I shared that I understand he is frustrated about this though we have truly been trying everything we can to transition him there over the weekend.  We reviewed that unfortunately it is a bit of a waiting game sometimes with these placements.  From the perspective of symptoms Cody Austin for the most part is comfortable and is cleansing his face this morning.  Questions and concerns addressed   Palliative Support Provided  Objective Assessment: Vital Signs Vitals:   10/16/21 1215 10/16/21 2000  BP: 116/60 121/63  Pulse: 77 76  Resp: 17 14  Temp: 98 F (36.7 C) 98.1 F (36.7 C)  SpO2: 96% 100%    Intake/Output Summary (Last 24 hours) at 10/18/2021 1113 Last data filed at 10/18/2021 0200 Gross per 24 hour  Intake  350 ml  Output 750 ml  Net -400 ml    Last Weight  Most recent update: 10/16/2021  7:34 AM    Weight  107.2 kg (236 lb 5.3 oz)            Gen:  Caucasian M in NAD HEENT: Jaundiced sclera, dry mucous membranes CV: Regular rate and rhythm  PULM: On 3LPM [place] ABD: distended EXT: Generalized edema Neuro: Alert and oriented x3  SUMMARY OF RECOMMENDATIONS   DNAR/DNI  Transition focus to comfort  Continue low-dose as needed medications --> has not required any Dilaudid or lorazepam  Have stopped medications that add to pill burden  Unrestricted family visitation  Appreciate chaplaincy support  Transitions of care team have made a referral to hospice of [facility]  --> As of this morning still awaiting formal acceptance by Physician at [place]  Ongoing palliative care while in house  Exceptionally limited prognosis in the setting of end-stage Nash cirrhosis - prognosis < 2 weeks  Billing based on MDM: High --> opioid review ______________________________________________________________________________________ [place] Palliative Medicine Team Team Cell Phone: [phone] Please utilize secure chat with additional questions, if there is no response within 30 minutes please call the above phone number  Palliative Medicine Team providers are available by phone from 7am to 7pm daily and can be reached through the team cell phone.  Should this patient require assistance outside of these hours, please call the patient's attending physician.

## 2021-10-19 LAB — GLUCOSE, CAPILLARY: Glucose-Capillary: 98 mg/dL (ref 70–99)

## 2021-10-26 ENCOUNTER — Ambulatory Visit (INDEPENDENT_AMBULATORY_CARE_PROVIDER_SITE_OTHER): Payer: Medicare Other | Admitting: Gastroenterology

## 2021-11-17 DEATH — deceased

## 2023-03-30 IMAGING — DX DG CHEST 2V
2 series · 2 of 2 positions shown · non-contrast
Comparison: None.

CLINICAL DATA: Chest pain.

EXAM:
CHEST - 2 VIEW

[chest lat]
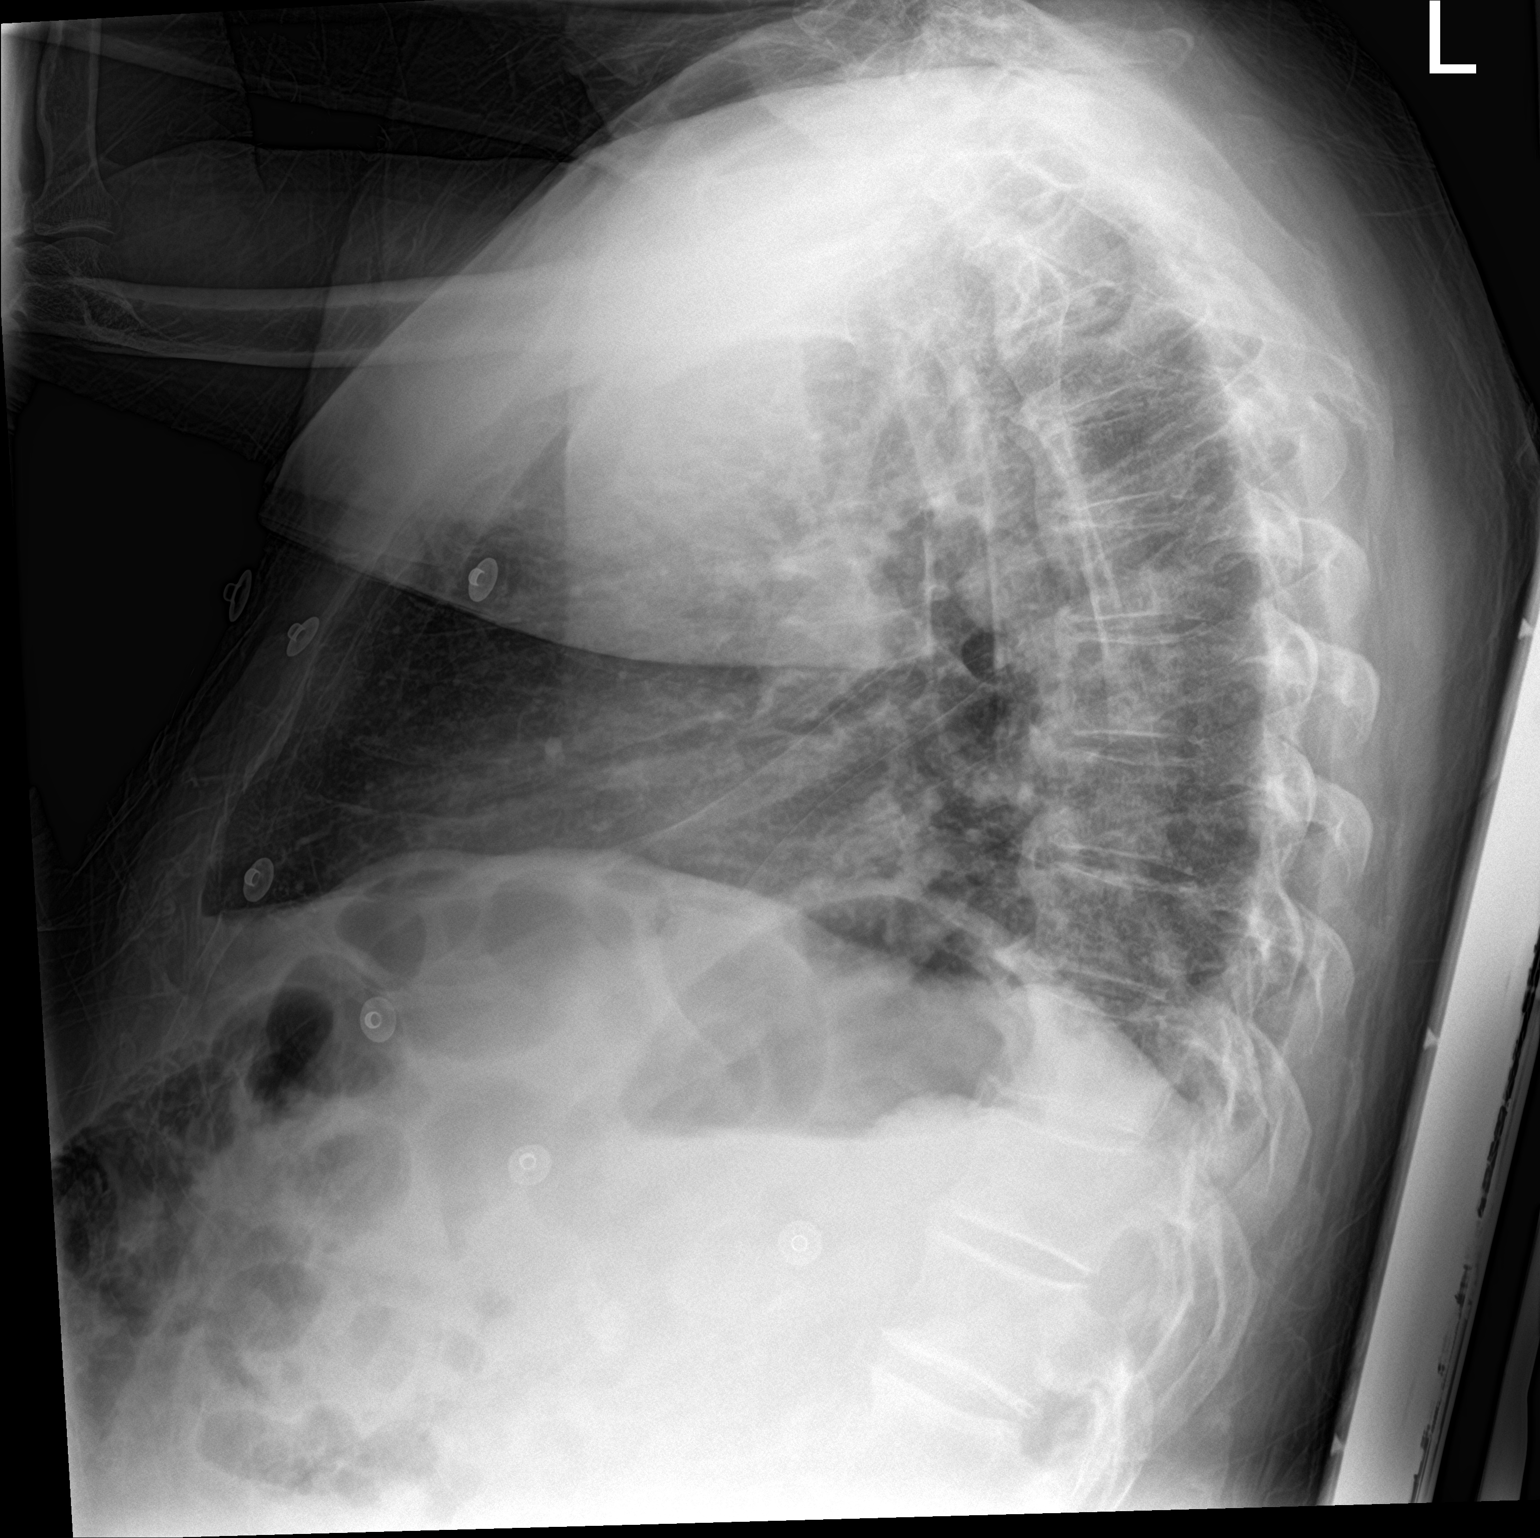

[chest ap]
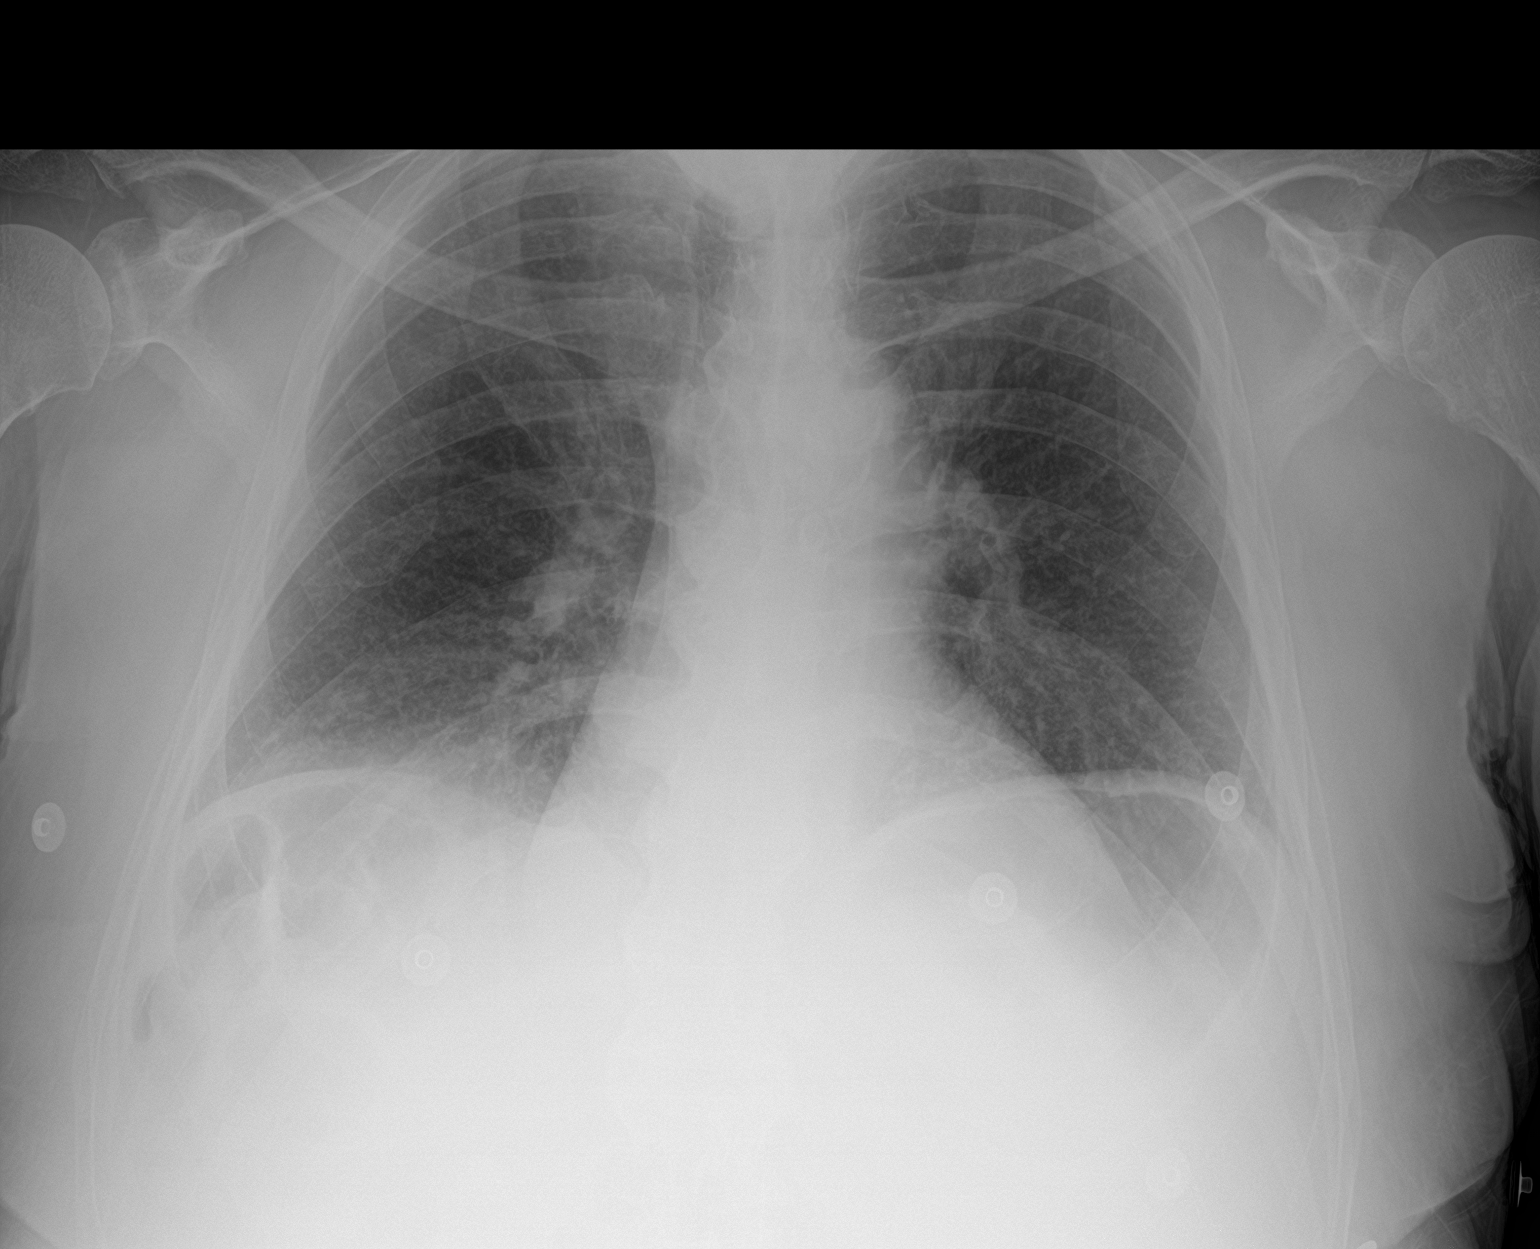

[2 of 2 positions shown; findings below may reference images not displayed]

FINDINGS: Cardiac silhouette is mildly enlarged. Mediastinal contours are
within normal limits with mild calcification within aortic arch.
Mildly decreased lung volumes with bronchovascular crowding. No
focal airspace opacity to indicate pneumonia. No pulmonary edema,
pleural effusion, or pneumothorax. Mild-to-moderate multilevel
degenerative disc changes of the thoracic spine.
IMPRESSION: Mildly decreased lung volumes with mild bronchovascular crowding. No
acute lung process.

## 2023-06-05 IMAGING — US US PARACENTESIS
1 series · 2 of 2 positions shown · non-contrast
Comparison: none

INDICATION: Nonalcoholic cirrhosis

[Series 1: us paracentesis · 2 of 2 slices shown]
[im 1/2]
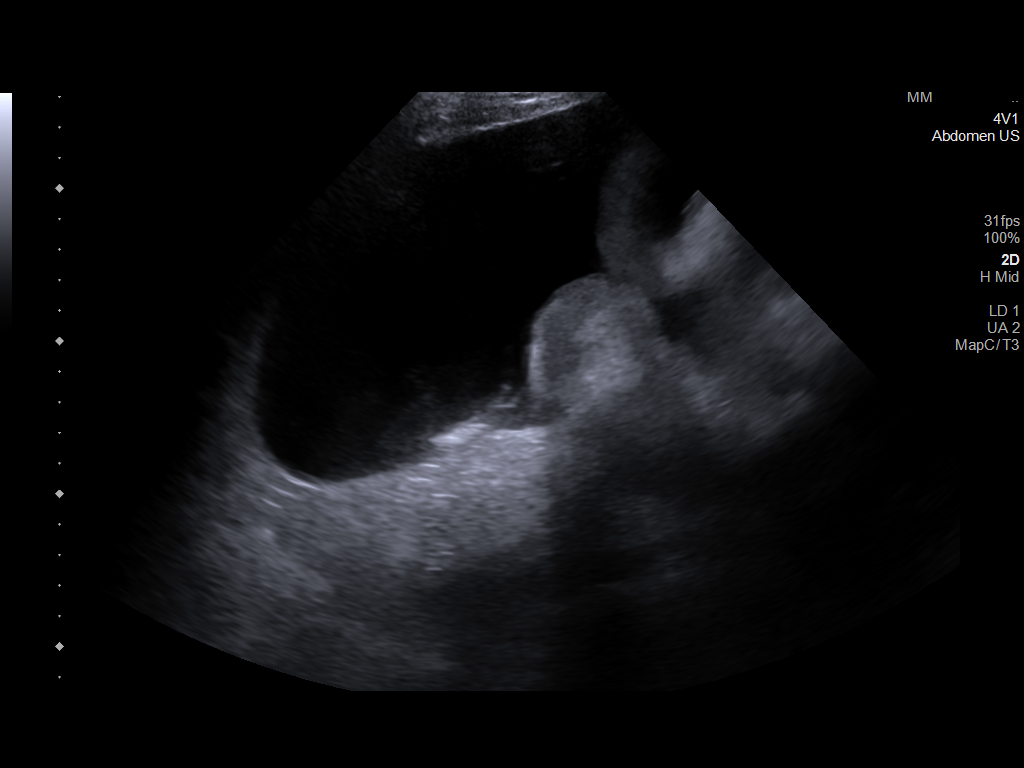
[im 2/2]
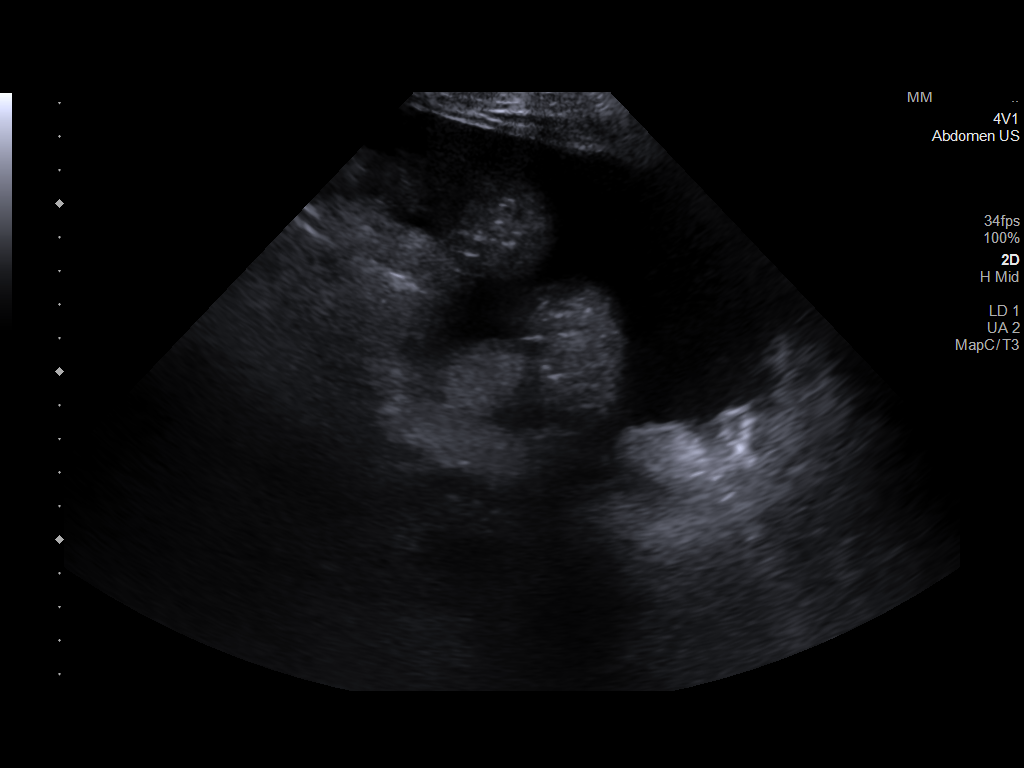

[2 of 2 positions shown; findings below may reference images not displayed]

EXAM:
ULTRASOUND GUIDED DIAGNOSTIC AND THERAPEUTIC PARACENTESIS

MEDICATIONS:
None.

COMPLICATIONS:
None immediate.

PROCEDURE:
Informed written consent was obtained from the patient after a
discussion of the risks, benefits and alternatives to treatment. A
timeout was performed prior to the initiation of the procedure.

Initial ultrasound scanning demonstrates a large amount of ascites
within the right lower abdominal quadrant. The right lower abdomen
was prepped and draped in the usual sterile fashion. 1% lidocaine
was used for local anesthesia.

Following this, a 5 French Yueh catheter was introduced. An
ultrasound image was saved for documentation purposes. The
paracentesis was performed. The catheter was removed and a dressing
was applied. The patient tolerated the procedure well without
immediate post procedural complication.
Patient received post-procedure intravenous albumin; see nursing
notes for details.
FINDINGS: A total of approximately 3.7 L of amber colored ascitic fluid was
removed. Samples were sent to the laboratory as requested by the
clinical team.
IMPRESSION: Successful ultrasound-guided paracentesis yielding 3.7 liters of
peritoneal fluid.

## 2023-06-20 IMAGING — DX DG CHEST 1V PORT
1 series · 1 of 1 positions shown · non-contrast
Comparison: October 05, 2021.

CLINICAL DATA: Post blood transfusion, assess for edema.

EXAM:
PORTABLE CHEST 1 VIEW

[chest ap]
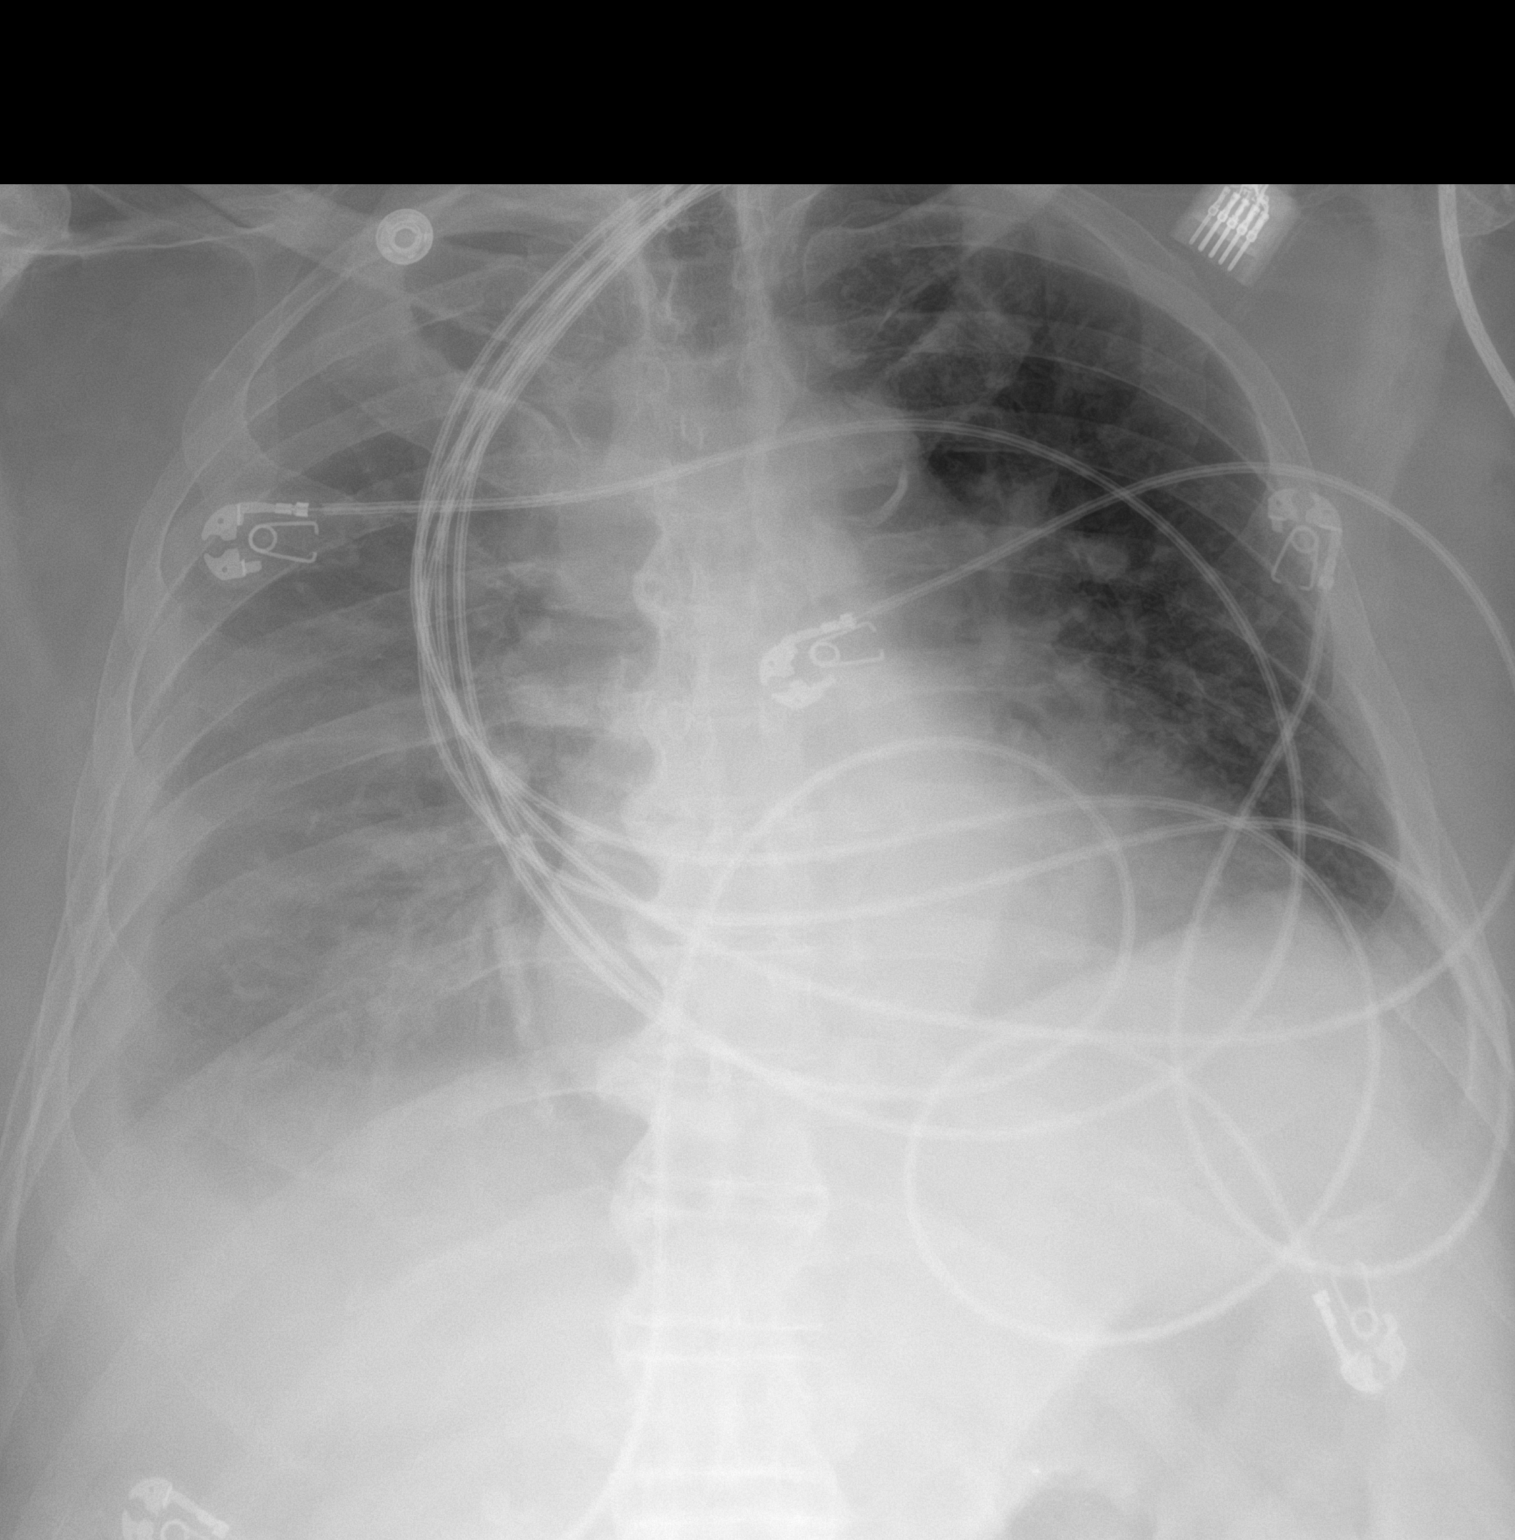

[1 of 1 positions shown; findings below may reference images not displayed]

FINDINGS: EKG leads project over the chest.

Cardiomediastinal contours and hilar structures are stable,
cardiomediastinal contours and hilar structures are stable and
partially obscured by graded opacity in the RIGHT chest which is
similar to previous imaging. Mild increased interstitial markings
are present throughout the chest.

On limited assessment there is no acute skeletal finding.
IMPRESSION: 1. RIGHT-sided pleural effusion and interstitial prominence likely
compatible with mild edema similar to previous imaging.

## 2023-06-21 IMAGING — DX DG CHEST 1V PORT
1 series · 1 of 1 positions shown · non-contrast
Comparison: Radiographs 10/06/2021 and 10/05/2021.

CLINICAL DATA: Intubation.

EXAM:
PORTABLE CHEST 1 VIEW

[chest ap]
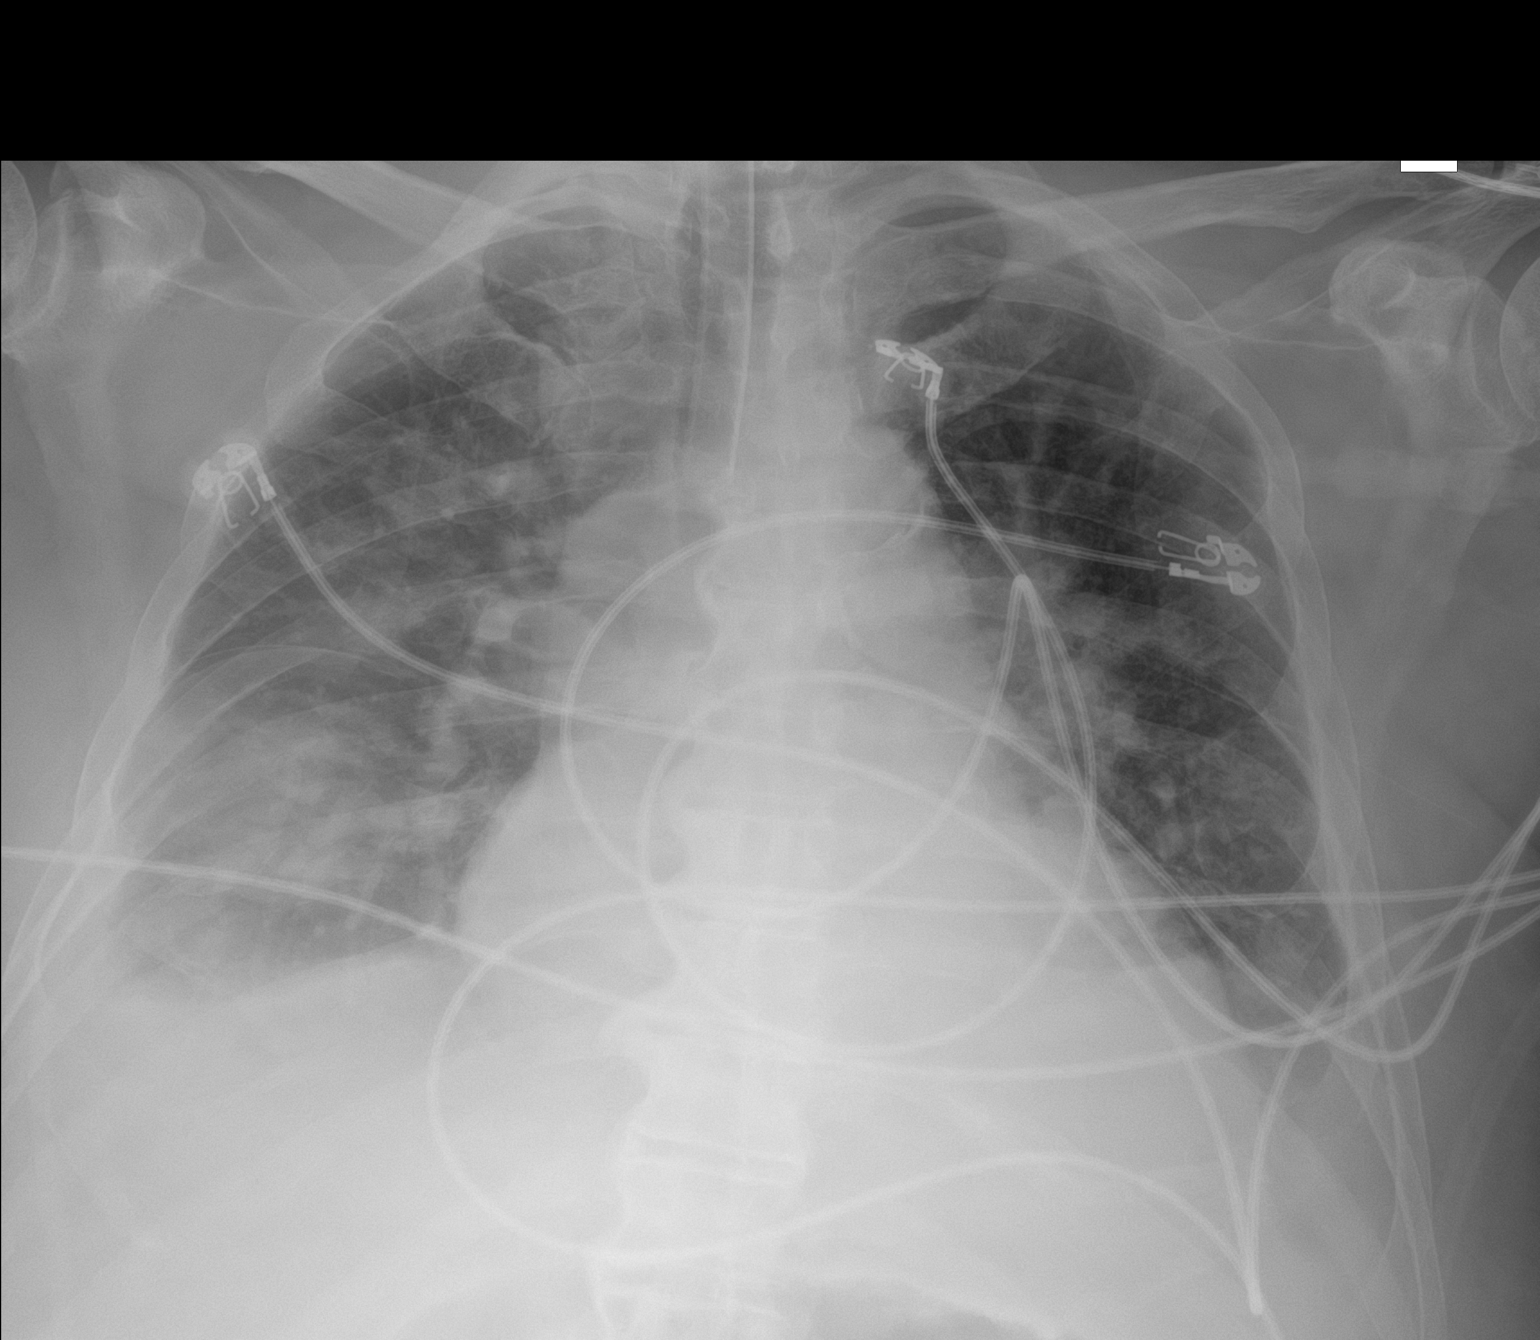

[1 of 1 positions shown; findings below may reference images not displayed]

FINDINGS: 4414 hours. Interval intubation. Tip of the endotracheal tube is
above the carina which is not well defined. The heart size and
mediastinal contours are stable with aortic atherosclerosis.
Persistent vascular congestion and asymmetric veiling opacity on the
right, likely due to a right pleural effusion. No evidence of
pneumothorax. Degenerative changes throughout the spine. Telemetry
leads overlie the chest.
IMPRESSION: 1. Satisfactory position of the endotracheal tube.
2. Vascular congestion with probable asymmetric right pleural
effusion, as before.

## 2023-06-23 IMAGING — DX DG CHEST 1V PORT
1 series · 1 of 1 positions shown · non-contrast
Comparison: Portable exam 5918 hours compared to 10/07/2021

CLINICAL DATA: Central line placement

EXAM:
PORTABLE CHEST 1 VIEW

[chest ap]
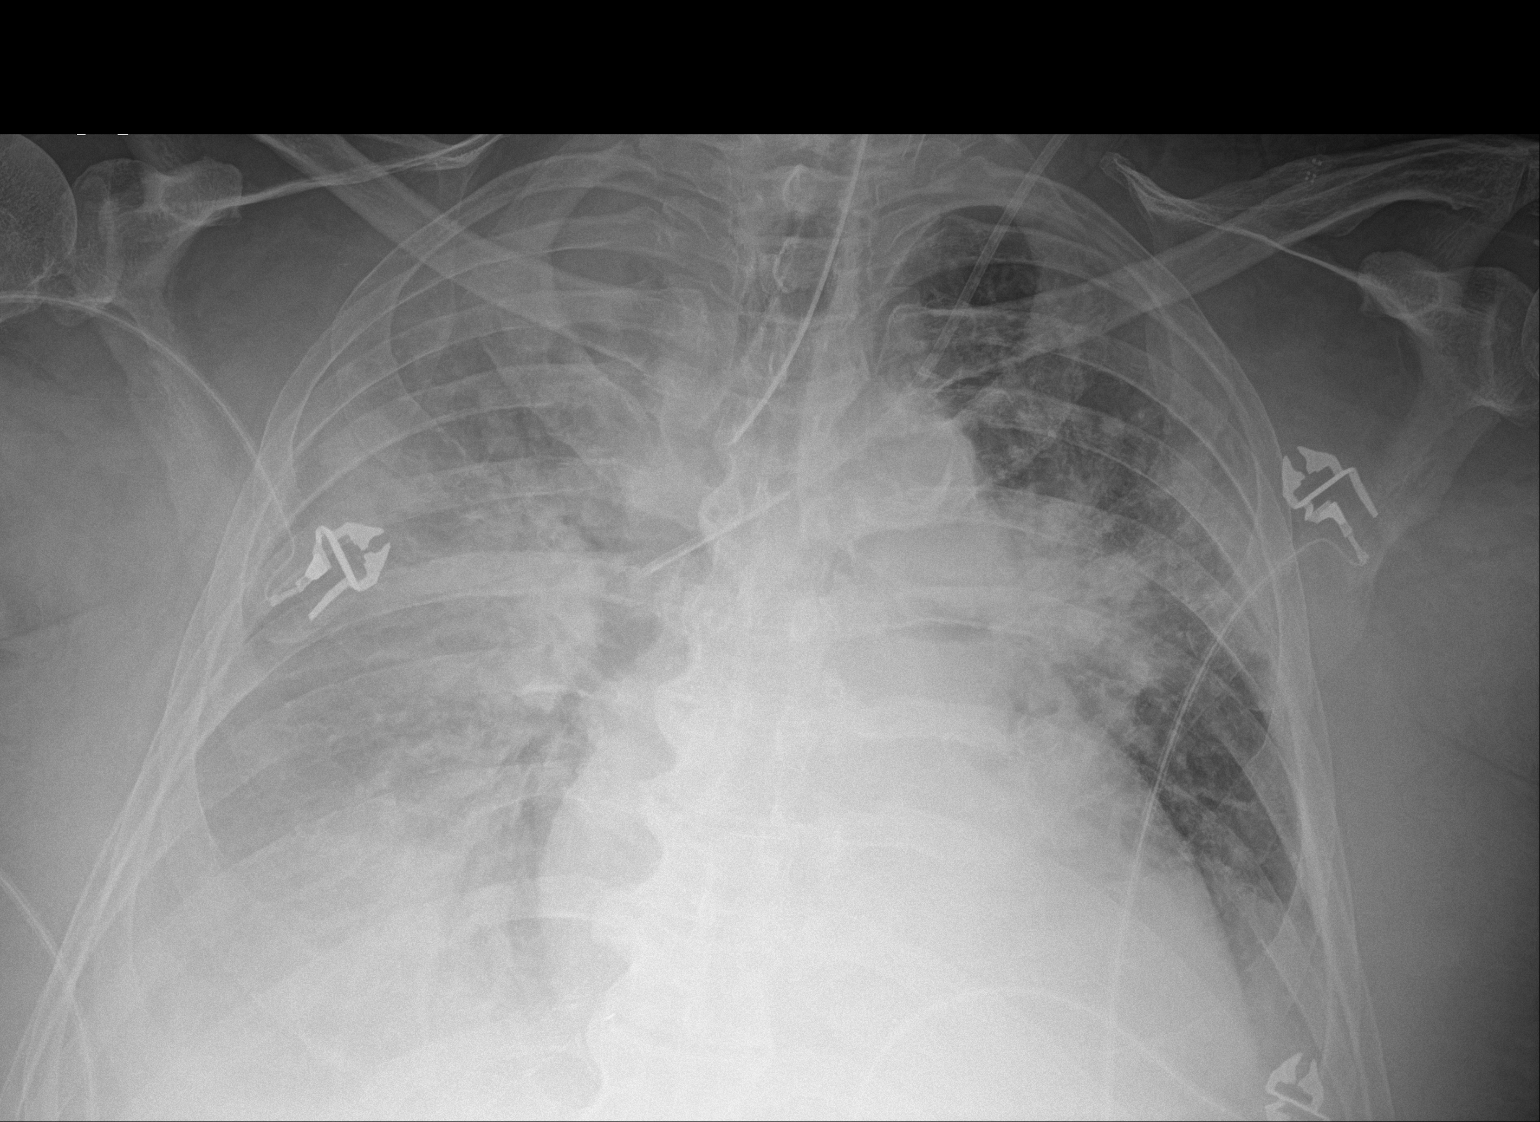

[1 of 1 positions shown; findings below may reference images not displayed]

FINDINGS: Tip of endotracheal tube projects 1.8 cm above carina.

LEFT jugular central venous catheter tip projects over SVC at the
level of the azygous arch.

Stable heart size and mediastinal contours.

Atherosclerotic calcification aorta.

BILATERAL pulmonary infiltrates increased from previous exam.

Probable layered RIGHT pleural effusion.

No pneumothorax.
IMPRESSION: Increased BILATERAL pulmonary infiltrates with probable RIGHT
pleural effusion.

No pneumothorax following LEFT jugular line placement.

Aortic Atherosclerosis ([id number]).
